# Patient Record
Sex: Male | Born: 1970 | Race: Black or African American | Hispanic: No | Marital: Single | Smoking: Former smoker
Health system: Southern US, Community
[De-identification: ages and names within clinical notes are randomized; demographics above are authoritative.]

## PROBLEM LIST (undated history)

## (undated) DIAGNOSIS — F1910 Other psychoactive substance abuse, uncomplicated: Secondary | ICD-10-CM

## (undated) DIAGNOSIS — A938 Other specified arthropod-borne viral fevers: Secondary | ICD-10-CM

## (undated) DIAGNOSIS — B1920 Unspecified viral hepatitis C without hepatic coma: Secondary | ICD-10-CM

## (undated) DIAGNOSIS — N5089 Other specified disorders of the male genital organs: Secondary | ICD-10-CM

## (undated) DIAGNOSIS — K029 Dental caries, unspecified: Secondary | ICD-10-CM

## (undated) DIAGNOSIS — H209 Unspecified iridocyclitis: Secondary | ICD-10-CM

## (undated) DIAGNOSIS — I4891 Unspecified atrial fibrillation: Secondary | ICD-10-CM

## (undated) DIAGNOSIS — I480 Paroxysmal atrial fibrillation: Secondary | ICD-10-CM

## (undated) HISTORY — DX: Other specified arthropod-borne viral fevers: A93.8

## (undated) HISTORY — PX: FINGER SURGERY: SHX640

## (undated) HISTORY — DX: Unspecified iridocyclitis: H20.9

## (undated) HISTORY — DX: Unspecified viral hepatitis C without hepatic coma: B19.20

## (undated) HISTORY — DX: Dental caries, unspecified: K02.9

## (undated) HISTORY — DX: Other specified disorders of the male genital organs: N50.89

## (undated) HISTORY — PX: SHOULDER SURGERY: SHX246

## (undated) HISTORY — PX: OTHER SURGICAL HISTORY: SHX169

---

## 2008-05-27 ENCOUNTER — Inpatient Hospital Stay (HOSPITAL_COMMUNITY): Admission: EM | Admit: 2008-05-27 | Discharge: 2008-05-31 | Payer: Self-pay | Admitting: Emergency Medicine

## 2009-03-04 ENCOUNTER — Emergency Department (HOSPITAL_COMMUNITY): Admission: EM | Admit: 2009-03-04 | Discharge: 2009-03-04 | Payer: Self-pay | Admitting: Family Medicine

## 2009-03-15 ENCOUNTER — Emergency Department (HOSPITAL_COMMUNITY): Admission: EM | Admit: 2009-03-15 | Discharge: 2009-03-15 | Payer: Self-pay | Admitting: Emergency Medicine

## 2009-05-28 ENCOUNTER — Emergency Department (HOSPITAL_COMMUNITY): Admission: EM | Admit: 2009-05-28 | Discharge: 2009-05-28 | Payer: Self-pay | Admitting: Emergency Medicine

## 2009-08-12 ENCOUNTER — Emergency Department (HOSPITAL_COMMUNITY): Admission: EM | Admit: 2009-08-12 | Discharge: 2009-08-12 | Payer: Self-pay | Admitting: Emergency Medicine

## 2009-09-23 ENCOUNTER — Emergency Department (HOSPITAL_COMMUNITY): Admission: EM | Admit: 2009-09-23 | Discharge: 2009-09-23 | Payer: Self-pay | Admitting: Emergency Medicine

## 2009-09-25 ENCOUNTER — Emergency Department (HOSPITAL_COMMUNITY): Admission: EM | Admit: 2009-09-25 | Discharge: 2009-09-25 | Payer: Self-pay | Admitting: Emergency Medicine

## 2010-01-08 ENCOUNTER — Emergency Department (HOSPITAL_COMMUNITY): Admission: EM | Admit: 2010-01-08 | Discharge: 2010-01-08 | Payer: Self-pay | Admitting: Emergency Medicine

## 2010-02-15 ENCOUNTER — Emergency Department (HOSPITAL_COMMUNITY): Admission: EM | Admit: 2010-02-15 | Discharge: 2010-02-15 | Payer: Self-pay | Admitting: Emergency Medicine

## 2010-03-12 ENCOUNTER — Emergency Department (HOSPITAL_COMMUNITY): Admission: EM | Admit: 2010-03-12 | Discharge: 2010-03-12 | Payer: Self-pay | Admitting: Emergency Medicine

## 2010-03-22 ENCOUNTER — Emergency Department (HOSPITAL_COMMUNITY): Admission: EM | Admit: 2010-03-22 | Discharge: 2010-03-22 | Payer: Self-pay | Admitting: Emergency Medicine

## 2010-03-27 ENCOUNTER — Emergency Department (HOSPITAL_COMMUNITY): Admission: EM | Admit: 2010-03-27 | Discharge: 2010-03-28 | Payer: Self-pay | Admitting: Emergency Medicine

## 2010-04-08 ENCOUNTER — Emergency Department (HOSPITAL_COMMUNITY): Admission: EM | Admit: 2010-04-08 | Discharge: 2010-04-09 | Payer: Self-pay | Admitting: Emergency Medicine

## 2010-04-14 ENCOUNTER — Emergency Department (HOSPITAL_COMMUNITY): Admission: EM | Admit: 2010-04-14 | Discharge: 2010-04-14 | Payer: Self-pay | Admitting: Emergency Medicine

## 2010-05-21 ENCOUNTER — Emergency Department (HOSPITAL_COMMUNITY): Admission: EM | Admit: 2010-05-21 | Discharge: 2010-05-21 | Payer: Self-pay | Admitting: Emergency Medicine

## 2010-10-13 ENCOUNTER — Emergency Department (HOSPITAL_COMMUNITY)
Admission: EM | Admit: 2010-10-13 | Discharge: 2010-10-14 | Payer: Self-pay | Source: Home / Self Care | Admitting: Emergency Medicine

## 2011-01-24 LAB — POCT I-STAT, CHEM 8
BUN: 5 mg/dL — ABNORMAL LOW (ref 6–23)
Calcium, Ion: 0.98 mmol/L — ABNORMAL LOW (ref 1.12–1.32)
Glucose, Bld: 127 mg/dL — ABNORMAL HIGH (ref 70–99)
HCT: 45 % (ref 39.0–52.0)
TCO2: 23 mmol/L (ref 0–100)

## 2011-01-24 LAB — COMPREHENSIVE METABOLIC PANEL
ALT: 70 U/L — ABNORMAL HIGH (ref 0–53)
Alkaline Phosphatase: 92 U/L (ref 39–117)
CO2: 27 mEq/L (ref 19–32)
Chloride: 107 mEq/L (ref 96–112)
GFR calc Af Amer: >60 mL/min (ref >60)
Sodium: 141 mEq/L (ref 135–145)
Total Bilirubin: 0.6 mg/dL (ref 0.3–1.2)

## 2011-01-24 LAB — PROTIME-INR
INR: 0.99 (ref 0.00–1.49)
Prothrombin Time: 13 seconds (ref 11.6–15.2)

## 2011-01-24 LAB — CBC
HCT: 41.8 % (ref 39.0–52.0)
Hemoglobin: 15 g/dL (ref 13.0–17.0)
MCHC: 35.8 g/dL (ref 30.0–36.0)
MCV: 90.3 fL (ref 78.0–100.0)
Platelets: 250 10*3/uL (ref 150–400)
RDW: 14.4 % (ref 11.5–15.5)
WBC: 5 10*3/uL (ref 4.0–10.5)

## 2011-03-07 ENCOUNTER — Emergency Department (HOSPITAL_COMMUNITY)
Admission: EM | Admit: 2011-03-07 | Discharge: 2011-03-07 | Disposition: A | Discharge status: Home / Self Care | Payer: Self-pay | Attending: Emergency Medicine | Admitting: Emergency Medicine

## 2011-03-07 ENCOUNTER — Emergency Department (HOSPITAL_COMMUNITY): Payer: Self-pay

## 2011-03-07 DIAGNOSIS — M545 Low back pain, unspecified: Secondary | ICD-10-CM | POA: Insufficient documentation

## 2011-03-07 DIAGNOSIS — W108XXA Fall (on) (from) other stairs and steps, initial encounter: Secondary | ICD-10-CM | POA: Insufficient documentation

## 2011-03-07 DIAGNOSIS — Y92009 Unspecified place in unspecified non-institutional (private) residence as the place of occurrence of the external cause: Secondary | ICD-10-CM | POA: Insufficient documentation

## 2011-03-14 ENCOUNTER — Emergency Department (HOSPITAL_COMMUNITY)
Admission: EM | Admit: 2011-03-14 | Discharge: 2011-03-14 | Disposition: A | Discharge status: Home / Self Care | Payer: Self-pay | Attending: Emergency Medicine | Admitting: Emergency Medicine

## 2011-03-14 DIAGNOSIS — D573 Sickle-cell trait: Secondary | ICD-10-CM | POA: Insufficient documentation

## 2011-03-14 DIAGNOSIS — L500 Allergic urticaria: Secondary | ICD-10-CM | POA: Insufficient documentation

## 2011-03-14 DIAGNOSIS — L299 Pruritus, unspecified: Secondary | ICD-10-CM | POA: Insufficient documentation

## 2011-03-22 NOTE — Op Note (Signed)
[name], [name] NO.:  [id number]   MEDICAL RECORD NO.:  [id number]          PATIENT TYPE:  INP   LOCATION:  5012                         FACILITY:  [facility]   PHYSICIAN:  Dionne Ano. Gramig III, M.D.DATE OF BIRTH:  [birth year]   DATE OF PROCEDURE:  DATE OF DISCHARGE:                               OPERATIVE REPORT   PREOPERATIVE DIAGNOSIS:  Left middle finger deep abscess.   POSTOPERATIVE DIAGNOSIS:  Left middle finger deep abscess.   PROCEDURE:  Incision and drainage of left middle finger deep abscess.   SURGEON:  Dionne Ano. Amanda Pea, MD   ASSISTANT:  None.   COMPLICATIONS:  None.   ANESTHESIA:  General.   TOURNIQUET TIME:  Less than an hour.   INDICATIONS FOR PROCEDURE:  Bruce [name] is a pleasant male who 4 days  ago sustained a crushing injury to his finger.  He was seen in the  emergency department.  I was asked to see and take over his care in  regards to his deep abscess.  He has significant pain complaints and  disarray about the left middle finger.  He desires to proceed with the  above-mentioned operative intervention.   OPERATION IN DETAIL:  The patient was seen by myself and anesthesia,  taken to the operative suite, and underwent a smooth induction of  general anesthesia.  Placed supine and prepped and draped in the usual  sterile fashion with Betadine scrub and paint.  Following this, a time-  out was called and I and D was performed about the left middle finger.  This was I and D of a deep abscess and removal of nonviable tissue  dorsally and volarly occurred.  I entered the finger pulp as well as the  dorsal apparatus and removed the medial and lateral hard nail plate to  prevent problems with sequestered infection.  He was lavaged with 2  liters of saline and following this, he was placed in a sterile  dressing.   The patient will be admitted for IV antibiotics, general postop  observation, will begin whirlpools tomorrow, and daily  packing of the  wound.  I have discussed with the patient these issues at length, do's  and don'ts, etc., and the family is aware.      Dionne Ano. Everlene Other, M.D.    Nash Mantis  D:  05/28/2008  T:  05/28/2008  Job:  [id number]

## 2011-03-25 NOTE — Discharge Summary (Signed)
NAMESABATINO, Bruce NO.:  [id number]   MEDICAL RECORD NO.:  [id number]          PATIENT TYPE:  INP   LOCATION:  5012                         FACILITY:  [facility]   PHYSICIAN:  Dionne Ano. Gramig, M.D.DATE OF BIRTH:  Mar 12, 1971   DATE OF ADMISSION:  05/27/2008  DATE OF DISCHARGE:  05/31/2008                               DISCHARGE SUMMARY   ADMITTING DIAGNOSIS:  Left middle finger deep abscess.   DISCHARGE DIAGNOSIS:  Left middle finger deep abscess, improved.   SURGEON:  Dionne Ano. Gramig, MD   PROCEDURE:  Irrigation and debridement, left middle finger.   CONSULTANTS:  None.   BRIEF HISTORY OF PRESENT ILLNESS:  Bruce [name] is a pleasant 40 year old  gentleman who was seen and evaluated at [facility] Emergency Room after  he sustained a crush injury with possible retention of a foreign body to  the left middle finger.  He had a knee injury 4 days prior to his ER  visit and had increasing pain, soft tissue swelling, and erythema.  The  patient was updated with a tetanus shot.  He had been seen and evaluated  initially in the emergency room setting and given a gram of vancomycin  prior to hand consultation.  Upon evaluation, he was noted to have  significant pain, swelling to the distal tip of the finger consistent  with deep abscess.  There was noted erythema and ascending cellulitis.  Given the findings, we discussed with him at length the need for I and  D, finger reconstructions necessary, as well as admission to the  hospital for IV antibiotics, whirlpool and wound care.   HOSPITAL COURSE:  On May 27, 2008, the patient was admitted and  underwent I and D of the left middle finger without difficulty.  He  tolerated the procedure well.  Please see operative report for full  details.  He was prophylactic, placed on vancomycin and gentamicin for  postoperative antibiotic regime while intraoperative cultures were  pending.  However, given the fact the patient was  given vancomycin  preoperatively in the emergency room setting, high suspicion for skewed  results was noted.  Postoperative day #1, the patient was doing well and  he was less tender.  He is doing well with wound care.  Daily dressing  changes and packing.  He did complain of some pruritus about the  extremities and at the scrotal area.  On examination the patient was  stable.  He was afebrile.  His fingers showed positive erythema.  His  packing was intact.  He had no [place], positive range of motion.  No  frank purulence was noted.  Left forearm showed mild pruritic papules,  however, there is no erythema, no lesions present.  Scrotal area was  without edema, erythema, or obvious lesions or excoriations posterior  cast and small area of papule, otherwise no findings.  The patient was  given a medicated powder (Micro-Guard) to apply to scrotal area.  In  addition, hydrocortisone cream could be applied to the extremities given  isolated lesions at bedside.  Continue the IV antibiotics, and  cultures  were pending.  Diligent wound care was performed postoperatively.  Postop day #2, the patient was improved overall.  His pruritus was  improved with Benadryl, and his vital signs were stable.  He is  afebrile.  Cultures had no growth at that juncture.  Vital signs are  stable and afebrile.  White blood cell count was within normal limits.  His finger continued to improve without gross purulence or signs of any  accumulated infection.  The patient's condition continued to improve  over the following day.  Cultures were noted for multiple species  without predominance.  The patient continued wound care and on May 31, 2008, decision was made to discharge him home.   ASSESSMENT/FINAL DIAGNOSIS:  See discharge diagnosis.   PLAN:  Condition on discharge improved.   DIET:  Regular diet.   ACTIVITY:  He will keep his dressing clean, dry, and intact.  He will  need daily wound care with  dressing changes and packing.   Discharge medications Percocet 5/325 one to two p.o. q.4-6 h. p.r.n.  pain, over-the-counter Benadryl as needed, Bactrim DS one p.o. b.i.d.,  and vitamin C 500 mg daily.   The patient will follow up with [place] in the office setting in 1 week and  then he will work on home dressing changes, will call for any  difficulties.  All questions were encouraged and answered.      Bruce [name], P.A.-C.    ______________________________  Dionne Ano. Amanda Pea, M.D.    BB/MEDQ  D:  07/08/2008  T:  07/09/2008  Job:  [id number]

## 2011-05-04 ENCOUNTER — Emergency Department (HOSPITAL_COMMUNITY): Payer: Self-pay

## 2011-05-04 ENCOUNTER — Emergency Department (HOSPITAL_COMMUNITY)
Admission: EM | Admit: 2011-05-04 | Discharge: 2011-05-04 | Disposition: A | Discharge status: Home / Self Care | Payer: Self-pay | Attending: Emergency Medicine | Admitting: Emergency Medicine

## 2011-05-04 DIAGNOSIS — M25579 Pain in unspecified ankle and joints of unspecified foot: Secondary | ICD-10-CM | POA: Insufficient documentation

## 2011-05-04 DIAGNOSIS — X500XXA Overexertion from strenuous movement or load, initial encounter: Secondary | ICD-10-CM | POA: Insufficient documentation

## 2011-05-04 DIAGNOSIS — S93409A Sprain of unspecified ligament of unspecified ankle, initial encounter: Secondary | ICD-10-CM | POA: Insufficient documentation

## 2011-05-04 DIAGNOSIS — Y9367 Activity, basketball: Secondary | ICD-10-CM | POA: Insufficient documentation

## 2011-05-04 DIAGNOSIS — D573 Sickle-cell trait: Secondary | ICD-10-CM | POA: Insufficient documentation

## 2011-08-05 LAB — CBC
HCT: 46.6
Hemoglobin: 16
Platelets: 216
RBC: 5.1
WBC: 4.4

## 2011-08-05 LAB — BASIC METABOLIC PANEL
BUN: 4 — ABNORMAL LOW
GFR calc Af Amer: >60
GFR calc non Af Amer: >60
Potassium: 3.7
Sodium: 139

## 2011-08-05 LAB — CULTURE, ROUTINE-ABSCESS: Gram Stain: NONE SEEN

## 2011-08-05 LAB — ANAEROBIC CULTURE

## 2011-08-05 LAB — DIFFERENTIAL
Eosinophils Relative: 4
Lymphocytes Relative: 26
Lymphs Abs: 1.1

## 2012-03-30 ENCOUNTER — Encounter (HOSPITAL_COMMUNITY): Payer: Self-pay | Admitting: Emergency Medicine

## 2012-03-30 ENCOUNTER — Emergency Department (HOSPITAL_COMMUNITY)
Admission: EM | Admit: 2012-03-30 | Discharge: 2012-03-30 | Disposition: A | Discharge status: Home / Self Care | Payer: Self-pay | Attending: Emergency Medicine | Admitting: Emergency Medicine

## 2012-03-30 DIAGNOSIS — H209 Unspecified iridocyclitis: Secondary | ICD-10-CM | POA: Insufficient documentation

## 2012-03-30 DIAGNOSIS — Z87891 Personal history of nicotine dependence: Secondary | ICD-10-CM | POA: Insufficient documentation

## 2012-03-30 MED ORDER — FLUORESCEIN SODIUM 1 MG OP STRP
ORAL_STRIP | OPHTHALMIC | Status: AC
  Administered 2012-03-30: 03:00:00
  Filled 2012-03-30: qty 1

## 2012-03-30 MED ORDER — ATROPINE SULFATE 1 % OP SOLN
1.0000 [drp] | Freq: Once | OPHTHALMIC | Status: AC
  Administered 2012-03-30: 1 [drp] via OPHTHALMIC
  Filled 2012-03-30: qty 2

## 2012-03-30 MED ORDER — TETRACAINE HCL 0.5 % OP SOLN
1.0000 [drp] | Freq: Once | OPHTHALMIC | Status: AC
  Administered 2012-03-30: 1 [drp] via OPHTHALMIC
  Filled 2012-03-30: qty 2

## 2012-03-30 MED ORDER — OXYCODONE-ACETAMINOPHEN 5-325 MG PO TABS
2.0000 | ORAL_TABLET | Freq: Once | ORAL | Status: AC
  Administered 2012-03-30: 2 via ORAL
  Filled 2012-03-30: qty 2

## 2012-03-30 MED ORDER — OXYCODONE-ACETAMINOPHEN 5-325 MG PO TABS: 2.0000 | ORAL_TABLET | Freq: Four times a day (QID) | ORAL | Status: AC | PRN

## 2012-03-30 NOTE — ED Provider Notes (Signed)
History     CSN: [id number]  Arrival date & time 03/30/12  0007   First MD Initiated Contact with Patient 03/30/12 0211      Chief Complaint  Patient presents with  . Foreign Body in Eye    (Consider location/radiation/quality/duration/timing/severity/associated sxs/prior treatment) HPI Comments: Patient states he was playing basketball, eczema, got hit in the right, eye.  He's been having pain since then, worse tonight with photophobia, and redness.  He is unable to open his eye due to the pain is a foreign body sensation  Patient is a 41 y.o. male presenting with foreign body in eye. The history is provided by the patient.  Foreign Body in Eye This is a recurrent problem. The current episode started in the past 7 days. The problem occurs constantly. The problem has been gradually worsening. Associated symptoms include headaches. Pertinent negatives include no fever.    History reviewed. No pertinent past medical history.  History reviewed. No pertinent past surgical history.  History reviewed. No pertinent family history.  History  Substance Use Topics  . Smoking status: Former Games developer  . Smokeless tobacco: Not on file  . Alcohol Use: No      Review of Systems  Constitutional: Negative for fever.  Eyes: Positive for photophobia and pain. Negative for itching.  Neurological: Positive for headaches.    Allergies  Penicillins  Home Medications  No current outpatient prescriptions on file.  BP 99/63  Pulse 63  Temp(Src) 97.8 F (36.6 C) (Oral)  Resp 20  SpO2 96%  Physical Exam  Constitutional: He appears well-developed and well-nourished.  HENT:  Head: Normocephalic and atraumatic.  Eyes: Left eye exhibits no discharge. Right conjunctiva is injected. Left conjunctiva is not injected. Left conjunctiva has no hemorrhage. Right eye exhibits no nystagmus. Left eye exhibits normal extraocular motion and no nystagmus.  Slit lamp exam:      The right eye shows no  fluorescein uptake.       After fluorescein, and tetracycline.  Patient still unable to open his eye 2 to reaction to the light of asked for.  Atropine drops to be instilled as a cycloplegic for better exam  Neck: Normal range of motion.  Cardiovascular: Normal rate.   Pulmonary/Chest: Effort normal.  Musculoskeletal: Normal range of motion.  Neurological: He is alert.  Skin: Skin is warm.   after atropine drop instilled in eye.  Patient now able to open his eye focus.  No longer blurry vision.  Pain is  ED Course  Procedures (including critical care time)  Labs Reviewed - No data to display No results found.     MDM  Traumatic iritis        Arman Filter, NP 03/30/12 0421  Arman Filter, NP 03/30/12 [phone]

## 2012-03-30 NOTE — ED Provider Notes (Signed)
Medical screening examination/treatment/procedure(s) were performed by non-physician practitioner and as supervising physician I was immediately available for consultation/collaboration.  Cheri Guppy, MD 03/30/12 [phone]

## 2012-03-30 NOTE — ED Notes (Signed)
Patient with irritation of right eye, red and photophobia.  Patient states that it has been happening for last three days.

## 2012-03-30 NOTE — Discharge Instructions (Signed)
Iritis Iritis is when the colored part of the eye (iris) is red and painful (inflamed). Light might seem to hurt your eyes (light sensitivity). You may have blurred vision or start to tear up. It is important to treat this problem early.  HOME CARE  Use eyedrops or pills (corticosteroid medicine) as told by your doctor.   Only take medicine as told by your doctor.  GET HELP RIGHT AWAY IF:   You have redness in one or both eyes.   Light seems to hurt your eyes.   You have pain in one or both eyes.  MAKE SURE YOU:   Understand these instructions.   Will watch your condition.   Will get help right away if you are not doing well or get worse.  Document Released: 01/18/2010 Document Revised: 10/13/2011 Document Reviewed: 01/18/2010 [facility] Patient Information 2012 [place], [place]. I atropine eyedrops, one drop to your right eye 4 times a day.  Please make sure to call the ophthalmologist first thing in the morning to be seen for reexamination.  Seen in the emergency department, and referred

## 2012-04-05 ENCOUNTER — Emergency Department (HOSPITAL_COMMUNITY)
Admission: EM | Admit: 2012-04-05 | Discharge: 2012-04-06 | Disposition: A | Discharge status: Home / Self Care | Payer: Self-pay | Attending: Emergency Medicine | Admitting: Emergency Medicine

## 2012-04-05 ENCOUNTER — Encounter (HOSPITAL_COMMUNITY): Payer: Self-pay | Admitting: Emergency Medicine

## 2012-04-05 DIAGNOSIS — H53149 Visual discomfort, unspecified: Secondary | ICD-10-CM | POA: Insufficient documentation

## 2012-04-05 DIAGNOSIS — H209 Unspecified iridocyclitis: Secondary | ICD-10-CM | POA: Insufficient documentation

## 2012-04-05 MED ORDER — TETRACAINE HCL 0.5 % OP SOLN
OPHTHALMIC | Status: AC
  Administered 2012-04-05
  Filled 2012-04-05: qty 2

## 2012-04-05 NOTE — ED Notes (Signed)
Patient with right eye pain.  Patient seen here a few weeks ago with same.  Patient still having problems with double vision in right eye, photophobia and pain.  Patient states that eye appt is 2 weeks away.  Patient is CAOx3.

## 2012-04-05 NOTE — ED Notes (Signed)
PT. REPORTS PERSISTENT RIGHT EYE PAIN / BLURRED VISION , SEEN HERE LAST WEEK PRESCRIBED WITH EYE GTTS AND REFERRAL TO EYE MD - STATES APPT. IS 2 WEEKS FROM NOW.

## 2012-04-06 MED ORDER — ONDANSETRON 4 MG PO TBDP
ORAL_TABLET | ORAL | Status: AC
  Administered 2012-04-06: 8 mg via ORAL
  Filled 2012-04-06: qty 2

## 2012-04-06 MED ORDER — OXYCODONE-ACETAMINOPHEN 5-325 MG PO TABS
2.0000 | ORAL_TABLET | Freq: Once | ORAL | Status: AC
  Administered 2012-04-06: 2 via ORAL
  Filled 2012-04-06: qty 2

## 2012-04-06 MED ORDER — ATROPINE SULFATE 1 % OP SOLN
1.0000 [drp] | Freq: Two times a day (BID) | OPHTHALMIC | Status: DC
  Administered 2012-04-06: 1 [drp] via OPHTHALMIC
  Filled 2012-04-06: qty 2

## 2012-04-06 MED ORDER — PREDNISOLONE ACETATE 1 % OP SUSP
1.0000 [drp] | Freq: Four times a day (QID) | OPHTHALMIC | Status: DC
  Administered 2012-04-06: 1 [drp] via OPHTHALMIC
  Filled 2012-04-06: qty 1

## 2012-04-06 NOTE — Discharge Instructions (Signed)
Uveitis  Keep your scheduled followup appointment today in the clinic at 10 AM with the ophthalmologist. Bring your eyedrops with you and take them as directed.   The uveal tract (uvea) is a layer of the eye made up of three structures:   The choroid - a layer containing the eye's blood vessels located between the outer white part of the eyeball (sclera) and the inner layer (retina).   The iris - the colored portion of the eye.   The ciliary body - a area of muscle at the base of the iris that helps focus the lens.  Uveitis happens when any or all portions of the uveal tract become inflamed.   Iritis is when the iris becomes inflamed. It is the most common form of uveitis. It is extremely important to treat iritis early, as it may lead to internal eye damage causing scarring or diseases such as glaucoma. Some people have only one attack of iritis (in one or both eyes) in their lifetime, while others may get it many times.   Pars planitis is when the ciliary body becomes inflamed.   Choroiditis is when the choroid layer becomes inflamed. If the inflammation also involves the retina, it is called chorioretinitis.   Panuveitis is when inflammation affects all of the structures of the uveal tract.  CAUSES   Diseases where the body's immune system attacks tissues within your own body (autoimmune diseases).   Infections (tuberculosis, gonorrhea, fungus infections, Lyme disease, infection of the lining of the heart).   Trauma or injury.   Eye diseases (acute glaucoma and others).   Inflammation from other parts of the uveal track.   Severe eye infections.   Other rare diseases.  SYMPTOMS  Iritis  Eye pain or aching.   Light sensitivity.   Loss of sight or blurred vision.   Redness of the eye. This is often accompanied by a ring of redness around the outside of the cornea or clear covering at the front of the eye (ciliary flush).   Excessive tearing of the eye(s).   A small  pupil that does not enlarge in the dark and stays smaller than the other eye's pupil.   A whitish area that obscures the lower part of the colored iris. Sometimes this is visible when looking at the eye.  Since iritis causes the eye to become red, it is often confused with a much less dangerous form of "pink eye" or conjunctivitis. One of the most important symptoms is sensitivity to light. Anytime there is redness, discomfort in the eye(s) and extreme light sensitivity, it is extremely important to see an ophthalmologist as soon as possible. Pars Planitis  Mild, progressive drop in vision.   Floating spots in the field of vision in front of the eye.   Development of a cataract.  Choroiditis  May have no symptoms.   Progressive drop in vision.   Loss of vision in an isolated area of peripheral vision (scotoma) or to the side.   Sore, aching eye.  TREATMENT  Iritis  Corticosteroid eye drops and dilating agent eye drops are often given. Corticosteroid eye drops are used to decrease inflammation. Dilating drops help to prevent scarring of the iris and the risk of the iris becoming "stuck" to the front surface of the lens. Follow your caregivers exact instructions on taking and stopping corticosteroid medications (drops or pills).   Sometimes, the iritis will be so severe that it will not respond to commonly used medications. If  this happens, it may be necessary to use steroid injections. The injections are given under the eye's outer surface. Sometimes oral medications are given. Treatment used for iritis is usually made on an individual basis.  Pars Planitis  If mild, no treatment may be required   Corticosteroids are usually given orally or for severe cases, injections are given under the surface of the eye.  Choroiditis  If associated with an underlying disease, the disease itself is treated.   Corticosteroids are usually administered by mouth (orally).   Corticosteroid  injections or other medicines specific are given to treat the cause of infection or inflammation. These injections are given into the interior cavity of the eye (intravitreal injection).   If caused by a parasite or worm that can be seen by the ophthalmologists, laser treatments may be used to kill the parasite.  Panuveitis  Treatment depends upon the cause and severity of the inflammation. It may consist of any one, or a combination of the treatments outlined above.  HOME CARE INSTRUCTIONS  Your caregiver will give specific instructions regarding the use of eye medications or other medications. Follow all instructions in both taking and stopping the medications. SEEK IMMEDIATE MEDICAL CARE IF:  You have redness of one or both eyes.   You experience a great deal of light sensitivity.   You have pain or aching in either eye.   You notice a drop in vision in either eye.  MAKE SURE YOU:   Understand these instructions.   Will watch your condition.   Will get help right away if you are not doing well or get worse.

## 2012-04-06 NOTE — ED Notes (Signed)
Patient complaining of nausea.  MD notified.  New orders per Dr Dierdre Highman.

## 2012-04-06 NOTE — ED Provider Notes (Signed)
History     CSN: [id number]  Arrival date & time 04/05/12  2145   First MD Initiated Contact with Patient 04/05/12 2257      Chief Complaint  Patient presents with  . Eye Pain    (Consider location/radiation/quality/duration/timing/severity/associated sxs/prior treatment) HPI History provided by patient. Right eye pain for the last 2 weeks. Evaluated here week ago and diagnosed with uveitis. Patient states he was given referral to the ophthalmologist to be seen the next day and instead was scheduled to be seen in 2 weeks. He was given eyedrops but unable to recall what they were. They did seem to help somewhat the symptoms but they have returned. He has associated blurry vision in that eye. Some tearing but no drainage. No fevers. No trauma. Did have some foreign body sensation without any known foreign body or mechanism for the same. Does not wear contacts. No rashes. No headache. No sick contacts. No history of arthritis or autoimmune disease. No other medical problems. is not a welder or exposed to any UV radiation. Patient presents with persistent ongoing symptoms History reviewed. No pertinent past medical history.  History reviewed. No pertinent past surgical history.  No family history on file.  History  Substance Use Topics  . Smoking status: Former Games developer  . Smokeless tobacco: Not on file  . Alcohol Use: No      Review of Systems  Constitutional: Negative for fever and chills.  HENT: Negative for neck pain and neck stiffness.   Eyes: Positive for photophobia, pain and redness.  Respiratory: Negative for shortness of breath.   Cardiovascular: Negative for chest pain.  Gastrointestinal: Negative for abdominal pain.  Genitourinary: Negative for dysuria.  Musculoskeletal: Negative for back pain.  Skin: Negative for rash.  Neurological: Negative for headaches.  All other systems reviewed and are negative.    Allergies  Penicillins  Home Medications   Current  Outpatient Rx  Name Route Sig Dispense Refill  . OXYCODONE-ACETAMINOPHEN 5-325 MG PO TABS Oral Take 2 tablets by mouth every 6 (six) hours as needed for pain. 20 tablet 0    BP 117/74  Pulse 60  Temp(Src) 98.1 F (36.7 C) (Oral)  Resp 14  SpO2 100%  Physical Exam  Constitutional: He is oriented to person, place, and time. He appears well-developed and well-nourished.  HENT:  Head: Normocephalic and atraumatic.  Eyes: EOM are normal. Pupils are equal, round, and reactive to light.       Has a very difficult time opening his right eye due to photophobia and pain. Conjunctival injection present. No obvious foreign body. No dye uptake with fluoro. There is tenderness over the eye. No discharge. No consensual photophobia. No peri orbital erythema or swelling. Lids and lashes clear. Tono-Pen measured 23 right eye.    Neck: Trachea normal. Neck supple. No thyromegaly present.  Cardiovascular: Normal rate, regular rhythm, S1 normal, S2 normal and normal pulses.     No systolic murmur is present   No diastolic murmur is present  Pulses:      Radial pulses are 2+ on the right side, and 2+ on the left side.  Pulmonary/Chest: Effort normal and breath sounds normal. He has no wheezes. He has no rhonchi. He has no rales. He exhibits no tenderness.  Abdominal: Soft. Normal appearance and bowel sounds are normal. There is no tenderness. There is no CVA tenderness and negative Murphy's sign.  Musculoskeletal:       Normal gait no extremity abnormalities  Neurological: He  is alert and oriented to person, place, and time. He has normal strength. No sensory deficit. GCS eye subscore is 4. GCS verbal subscore is 5. GCS motor subscore is 6.  Skin: Skin is warm and dry. No rash noted. He is not diaphoretic.  Psychiatric: His speech is normal.       Cooperative and appropriate    ED Course  Procedures (including critical care time)  12:12 AM d/w DR Gwen Pounds as above, recs atropine and pred-forte and  will see PT in am today at 10:00am in his office. H did ask for PT to bring all eye drops with him.   Pain medications provided. Drops provided as above. Patient agrees to followup at 10 AM in the clinic   MDM   Right eye pain concern for uveitis. Eyedrops as above. Pain control. Ophthalmology followup today in the clinic.        Sunnie Nielsen, MD 04/06/12 [phone]

## 2012-05-01 ENCOUNTER — Emergency Department (HOSPITAL_COMMUNITY): Payer: Self-pay

## 2012-05-01 ENCOUNTER — Inpatient Hospital Stay (HOSPITAL_COMMUNITY)
Admission: EM | Admit: 2012-05-01 | Discharge: 2012-05-06 | DRG: 866 | Disposition: A | Discharge status: Home / Self Care | Payer: Self-pay | Attending: Infectious Disease | Admitting: Infectious Disease

## 2012-05-01 ENCOUNTER — Encounter (HOSPITAL_COMMUNITY): Payer: Self-pay | Admitting: *Deleted

## 2012-05-01 DIAGNOSIS — A938 Other specified arthropod-borne viral fevers: Principal | ICD-10-CM | POA: Diagnosis present

## 2012-05-01 DIAGNOSIS — D759 Disease of blood and blood-forming organs, unspecified: Secondary | ICD-10-CM | POA: Diagnosis present

## 2012-05-01 DIAGNOSIS — B356 Tinea cruris: Secondary | ICD-10-CM | POA: Diagnosis present

## 2012-05-01 DIAGNOSIS — R5081 Fever presenting with conditions classified elsewhere: Secondary | ICD-10-CM | POA: Diagnosis present

## 2012-05-01 DIAGNOSIS — D696 Thrombocytopenia, unspecified: Secondary | ICD-10-CM | POA: Diagnosis present

## 2012-05-01 DIAGNOSIS — D573 Sickle-cell trait: Secondary | ICD-10-CM | POA: Diagnosis present

## 2012-05-01 DIAGNOSIS — Y9239 Other specified sports and athletic area as the place of occurrence of the external cause: Secondary | ICD-10-CM

## 2012-05-01 DIAGNOSIS — IMO0001 Reserved for inherently not codable concepts without codable children: Secondary | ICD-10-CM | POA: Diagnosis present

## 2012-05-01 DIAGNOSIS — R519 Headache, unspecified: Secondary | ICD-10-CM | POA: Diagnosis present

## 2012-05-01 DIAGNOSIS — N433 Hydrocele, unspecified: Secondary | ICD-10-CM | POA: Diagnosis present

## 2012-05-01 DIAGNOSIS — Y9367 Activity, basketball: Secondary | ICD-10-CM

## 2012-05-01 DIAGNOSIS — N4340 Spermatocele of epididymis, unspecified: Secondary | ICD-10-CM | POA: Diagnosis present

## 2012-05-01 DIAGNOSIS — R945 Abnormal results of liver function studies: Secondary | ICD-10-CM | POA: Diagnosis present

## 2012-05-01 DIAGNOSIS — I861 Scrotal varices: Secondary | ICD-10-CM | POA: Diagnosis present

## 2012-05-01 DIAGNOSIS — M791 Myalgia, unspecified site: Secondary | ICD-10-CM | POA: Diagnosis present

## 2012-05-01 DIAGNOSIS — Z880 Allergy status to penicillin: Secondary | ICD-10-CM

## 2012-05-01 DIAGNOSIS — M2550 Pain in unspecified joint: Secondary | ICD-10-CM | POA: Diagnosis present

## 2012-05-01 DIAGNOSIS — Z87891 Personal history of nicotine dependence: Secondary | ICD-10-CM

## 2012-05-01 DIAGNOSIS — R51 Headache: Secondary | ICD-10-CM | POA: Diagnosis present

## 2012-05-01 DIAGNOSIS — D709 Neutropenia, unspecified: Secondary | ICD-10-CM | POA: Diagnosis present

## 2012-05-01 DIAGNOSIS — T07XXXA Unspecified multiple injuries, initial encounter: Secondary | ICD-10-CM | POA: Diagnosis present

## 2012-05-01 LAB — CBC
HCT: 41.3 % (ref 39.0–52.0)
HCT: 44.1 % (ref 39.0–52.0)
Hemoglobin: 14.9 g/dL (ref 13.0–17.0)
Hemoglobin: 15.4 g/dL (ref 13.0–17.0)
MCH: 30.1 pg (ref 26.0–34.0)
MCHC: 36.1 g/dL — ABNORMAL HIGH (ref 30.0–36.0)
MCHC: 34.9 g/dL (ref 30.0–36.0)
MCV: 83.4 fL (ref 78.0–100.0)
Platelets: 135 10*3/uL — ABNORMAL LOW (ref 150–400)
RBC: 4.95 MIL/uL (ref 4.22–5.81)
RDW: 12.9 % (ref 11.5–15.5)
WBC: 2.2 10*3/uL — ABNORMAL LOW (ref 4.0–10.5)
WBC: 1.5 10*3/uL — ABNORMAL LOW (ref 4.0–10.5)

## 2012-05-01 LAB — HEPATIC FUNCTION PANEL
AST: 64 U/L — ABNORMAL HIGH (ref 0–37)
Albumin: 3.3 g/dL — ABNORMAL LOW (ref 3.5–5.2)
Total Bilirubin: 0.6 mg/dL (ref 0.3–1.2)

## 2012-05-01 LAB — DIFFERENTIAL
Basophils Absolute: 0 10*3/uL (ref 0.0–0.1)
Basophils Relative: 1 % (ref 0–1)
Eosinophils Absolute: 0 10*3/uL (ref 0.0–0.7)
Eosinophils Relative: 0 % (ref 0–5)
Lymphocytes Relative: 25 % (ref 12–46)
Lymphs Abs: 0.6 10*3/uL — ABNORMAL LOW (ref 0.7–4.0)
Monocytes Absolute: 0.2 10*3/uL (ref 0.1–1.0)
Monocytes Relative: 7 % (ref 3–12)
Neutro Abs: 1.5 10*3/uL — ABNORMAL LOW (ref 1.7–7.7)
Neutrophils Relative %: 68 % (ref 43–77)

## 2012-05-01 LAB — URINALYSIS, ROUTINE W REFLEX MICROSCOPIC
Bilirubin Urine: NEGATIVE
Glucose, UA: NEGATIVE mg/dL
Hgb urine dipstick: NEGATIVE
Ketones, ur: NEGATIVE mg/dL
Leukocytes, UA: NEGATIVE
Nitrite: NEGATIVE
Protein, ur: NEGATIVE mg/dL
Specific Gravity, Urine: 1.015 (ref 1.005–1.030)
Urobilinogen, UA: 1 mg/dL (ref 0.0–1.0)
pH: 8 (ref 5.0–8.0)

## 2012-05-01 LAB — BASIC METABOLIC PANEL
BUN: 8 mg/dL (ref 6–23)
CO2: 26 mEq/L (ref 19–32)
Calcium: 9.2 mg/dL (ref 8.4–10.5)
Chloride: 98 mEq/L (ref 96–112)
Creatinine, Ser: 1.19 mg/dL (ref 0.50–1.35)
GFR calc Af Amer: 85 mL/min — ABNORMAL LOW (ref >90)
GFR calc non Af Amer: 73 mL/min — ABNORMAL LOW (ref >90)
Glucose, Bld: 103 mg/dL — ABNORMAL HIGH (ref 70–99)
Potassium: 4 mEq/L (ref 3.5–5.1)
Sodium: 133 mEq/L — ABNORMAL LOW (ref 135–145)

## 2012-05-01 LAB — HIV ANTIBODY (ROUTINE TESTING W REFLEX): HIV: NONREACTIVE

## 2012-05-01 LAB — CREATININE, SERUM
GFR calc Af Amer: >90 mL/min (ref >90)
GFR calc non Af Amer: 83 mL/min — ABNORMAL LOW (ref >90)

## 2012-05-01 LAB — RPR: RPR Ser Ql: NONREACTIVE

## 2012-05-01 MED ORDER — ONDANSETRON HCL 4 MG/2ML IJ SOLN: 4.0000 mg | Freq: Three times a day (TID) | INTRAMUSCULAR | Status: DC | PRN

## 2012-05-01 MED ORDER — ONDANSETRON HCL 4 MG/2ML IJ SOLN: 4.0000 mg | Freq: Four times a day (QID) | INTRAMUSCULAR | Status: DC | PRN

## 2012-05-01 MED ORDER — ENOXAPARIN SODIUM 40 MG/0.4ML ~~LOC~~ SOLN
40.0000 mg | SUBCUTANEOUS | Status: DC
  Administered 2012-05-01: 40 mg via SUBCUTANEOUS
  Filled 2012-05-01 (×6): qty 0.4

## 2012-05-01 MED ORDER — ACETAMINOPHEN 325 MG PO TABS
650.0000 mg | ORAL_TABLET | Freq: Four times a day (QID) | ORAL | Status: DC | PRN
  Administered 2012-05-01 – 2012-05-03 (×3): 650 mg via ORAL
  Filled 2012-05-01 (×3): qty 2

## 2012-05-01 MED ORDER — VANCOMYCIN HCL IN DEXTROSE 1-5 GM/200ML-% IV SOLN
1000.0000 mg | Freq: Once | INTRAVENOUS | Status: AC
  Administered 2012-05-01: 1000 mg via INTRAVENOUS
  Filled 2012-05-01: qty 200

## 2012-05-01 MED ORDER — SODIUM CHLORIDE 0.9 % IJ SOLN
3.0000 mL | Freq: Two times a day (BID) | INTRAMUSCULAR | Status: DC
  Administered 2012-05-01 – 2012-05-05 (×4): 3 mL via INTRAVENOUS

## 2012-05-01 MED ORDER — DOXYCYCLINE HYCLATE 100 MG PO TABS
100.0000 mg | ORAL_TABLET | Freq: Two times a day (BID) | ORAL | Status: DC
  Administered 2012-05-01 – 2012-05-06 (×11): 100 mg via ORAL
  Filled 2012-05-01 (×13): qty 1

## 2012-05-01 MED ORDER — SODIUM CHLORIDE 0.9 % IV SOLN: 250.0000 mL | INTRAVENOUS | Status: DC | PRN

## 2012-05-01 MED ORDER — SODIUM CHLORIDE 0.9 % IJ SOLN: 3.0000 mL | INTRAMUSCULAR | Status: DC | PRN

## 2012-05-01 MED ORDER — ONDANSETRON HCL 4 MG/2ML IJ SOLN: 4.0000 mg | Freq: Once | INTRAMUSCULAR | Status: DC

## 2012-05-01 MED ORDER — ONDANSETRON HCL 4 MG PO TABS: 4.0000 mg | ORAL_TABLET | Freq: Four times a day (QID) | ORAL | Status: DC | PRN

## 2012-05-01 MED ORDER — SODIUM CHLORIDE 0.9 % IV BOLUS (SEPSIS)
1000.0000 mL | Freq: Once | INTRAVENOUS | Status: AC
  Administered 2012-05-01: 1000 mL via INTRAVENOUS

## 2012-05-01 MED ORDER — IBUPROFEN 800 MG PO TABS
800.0000 mg | ORAL_TABLET | Freq: Once | ORAL | Status: AC
  Administered 2012-05-01: 800 mg via ORAL
  Filled 2012-05-01: qty 1

## 2012-05-01 MED ORDER — LEVOFLOXACIN IN D5W 750 MG/150ML IV SOLN
750.0000 mg | Freq: Every day | INTRAVENOUS | Status: DC
  Administered 2012-05-01: 750 mg via INTRAVENOUS
  Filled 2012-05-01: qty 150

## 2012-05-01 MED ORDER — SODIUM CHLORIDE 0.9 % IV SOLN
INTRAVENOUS | Status: DC
  Administered 2012-05-01 – 2012-05-03 (×5): via INTRAVENOUS

## 2012-05-01 MED ORDER — IBUPROFEN 400 MG PO TABS: 400.0000 mg | ORAL_TABLET | Freq: Once | ORAL | Status: DC

## 2012-05-01 MED ORDER — HYDROCODONE-ACETAMINOPHEN 5-325 MG PO TABS
1.0000 | ORAL_TABLET | ORAL | Status: DC | PRN
  Administered 2012-05-01 – 2012-05-06 (×11): 1 via ORAL
  Filled 2012-05-01 (×12): qty 1

## 2012-05-01 MED ORDER — ACETAMINOPHEN 650 MG RE SUPP: 650.0000 mg | Freq: Four times a day (QID) | RECTAL | Status: DC | PRN

## 2012-05-01 NOTE — H&P (Signed)
Hospital Admission Note Date: 05/01/2012  Patient name: Bruce [name] Medical record number: [id number] Date of birth: 1971/07/12 Age: 41 y.o. Gender: male PCP: DEFAULT,PROVIDER, MD  Medical Service: Internal Medicine Teaching Service - [place]  Attending physician:  Gwen Her Dam   1st Contact:   Kristie Cowman  Pager: [phone] 2nd Contact:    Elyse Jarvis Pager: [phone] After 5 pm or weekends: 1st Contact:      Pager: [phone] 2nd Contact:      Pager: [phone]  Chief Complaint: fever and weakness  History of Present Illness: Bruce [name] is a 41 yo without significant past medical history who presents to [facility] ED with complaints of fever associated with general malaise over the past 3 days.  Patient states that he was in his normal state of health without known sick contacts and gradually began to feel weak, feverish and mildly nauseous without vomiting before today in the ED.  He states that he feels mildly short of breath on exertion but denies chest pain, cough, abdominal pain, change in bowel habits including blood or mucus, penile discharge or pain, or sick contacts He states that he does occasional work for a Editor, commissioning and has not noticed a tick bit or rash.  Additionally, he states that well over a month ago he suffered a trauma to his scrotum while playing basketball.  He subsequently developed scrotal swelling and bruising that has since resolved.  Of note, he states that he has had these scrotal swellings since the age of 24 which were evaluated at that time and deemed benign.  Furthermore, he states that he got an HIV at the [facility] that was negative. He endorses persistent scrotal tenderness especially over the swellings that is aggravated when lifting "boxes" or if his scrotum moves a lot while wearing boxer underwear.  ED course: Found to be neutropenic and febrile with Tmx 102.1, received I Liter NS IVF, Vancomycin 1 g x1, Levofloxacin 750 mg x1 and  Zofran  Meds: Current Outpatient Rx  Name Route Sig Dispense Refill  . DIPHENHYDRAMINE HCL 25 MG PO CAPS Oral Take 25 mg by mouth every 6 (six) hours as needed. For allergies    . IBUPROFEN 200 MG PO TABS Oral Take 200 mg by mouth every 6 (six) hours as needed. For pain    . ADULT MULTIVITAMIN W/MINERALS CH Oral Take 1 tablet by mouth daily.      Allergies: Allergies as of 05/01/2012 - Review Complete 05/01/2012  Allergen Reaction Noted  . Penicillins Other (See Comments) 03/30/2012   History reviewed. No pertinent past medical history. History reviewed. No pertinent past surgical history. History reviewed. No pertinent family history. History   Social History  . Marital Status: Single    Spouse Name: N/A    Number of Children: N/A  . Years of Education: N/A   Occupational History  . Not on file.   Social History Main Topics  . Smoking status: Former Games developer  . Smokeless tobacco: Not on file  . Alcohol Use: No  . Drug Use: No  . Sexually Active:    Other Topics Concern  . Not on file   Social History Narrative  . No narrative on file    Review of Systems: Constitutional:  Endorse subjective fever, chills, appetite change and fatigue. Denies appetite change.  HEENT: Denies cold-like symptoms including rhinorrhea  Respiratory: Endorses DOE, Denies SOB at rest, cough, and chest tightness  Cardiovascular: Denies chest pain, palpitations and leg swelling.  Gastrointestinal: Endorses nausea, vomiting x1, Denies abdominal pain, diarrhea, constipation, blood in stool and abdominal distention.  Genitourinary: Endorses dark urine and slight burning on urination, scrotum feels "heavy", pain in scrotum on movement and palpation, Denies dysuria, urgency, frequency, hematuria, flank pain and difficulty urinating.  Musculoskeletal: Denies myalgias  Skin: Denies rash   Neurological: Endorses weakness, Denies dizziness, syncope, light-headedness, and headaches.      Physical  Exam: Blood pressure 104/59, pulse 61, temperature 98.5 F (36.9 C), temperature source Oral, resp. rate 16, SpO2 100.00%. General: Well-developed, well-nourished, in no acute distress; lying flat on stretcher, girlfriend at bedside Head: Normocephalic, atraumatic. Eyes: PERRLA, EOMI, No signs of anemia or jaundice. Throat: Oropharynx nonerythematous, no exudate appreciated.  Neck: supple, no masses, no cervical LAD appreciated Lungs: Normal respiratory effort. Clear to auscultation bilaterally from apices to bases without crackles or wheezes appreciated. Heart: normal rate, regular rhythm, normal S1 and S2, no gallop, murmur, or rubs appreciated. Abdomen: BS normoactive. Soft, Nondistended, non-tender. No masses or organomegaly appreciated. GU: epididymis non-tender to palpation, 3 palpable swellings within scrotum which reduce lying down, mildly tender, normal testicular lie, normal cremasteric reflex Extremities: no inguinal LAD appreciated, femoral pulses intact, No pretibial edema, distal pulses intact Neurologic: grossly non-focal, alert and oriented x3, appropriate and cooperative throughout examination. Skin: no rashes noted, mildly diaphoretic to the touch   Lab results: Basic Metabolic Panel:  [facility] 05/01/12 0804  NA 133*  K 4.0  CL 98  CO2 26  GLUCOSE 103*  BUN 8  CREATININE 1.19  CALCIUM 9.2  MG --  PHOS --   CBC:  Basename 05/01/12 0804  WBC 2.2*  NEUTROABS 1.5*  HGB 14.9  HCT 41.3  MCV 83.4  PLT 135*   Urinalysis:  Basename 05/01/12 0835  COLORURINE YELLOW  LABSPEC 1.015  PHURINE 8.0  GLUCOSEU NEGATIVE  HGBUR NEGATIVE  BILIRUBINUR NEGATIVE  KETONESUR NEGATIVE  PROTEINUR NEGATIVE  UROBILINOGEN 1.0  NITRITE NEGATIVE  LEUKOCYTESUR NEGATIVE   Misc. Labs: HIV pending RPR pending  Imaging results:  Dg Chest 2 View  05/01/2012  *RADIOLOGY REPORT*  Clinical Data: Fever  CHEST - 2 VIEW  Comparison: 03/28/2010  Findings: Lungs are essentially  clear.  No pleural effusion or pneumothorax.  Cardiomediastinal silhouette is within normal limits.  Visualized osseous structures are within normal limits.  IMPRESSION: Normal chest radiographs.  Original Report Authenticated By: Charline Bills, M.D.   US Scrotum  05/01/2012  *RADIOLOGY REPORT*  Clinical Data:  1 cm firm tender scrotal mass, bilateral scrotal swelling since basketball injury 1 month ago  SCROTAL ULTRASOUND DOPPLER ULTRASOUND OF THE TESTICLES  Technique: Complete ultrasound examination of the testicles, epididymis, and other scrotal structures was performed.  Color and spectral Doppler ultrasound were also utilized to evaluate blood flow to the testicles.  Comparison:  None  Findings:  Right testis:  Normal in size, measuring 3.7 x 2.3 x 2.2 cm. Limited microlithiasis.  Left testis:  Normal in size, measuring 3.2 x 2.0 x 2.9 cm. Limited microlithiasis.  Right epididymis:  2.4 x 2.2 x 2.2 cm epididymal head cyst/spermatocele.  Left epididymis:  Normal in size and appearance, noting small less than 5 mm epididymal head cysts.  Hydrocele:  Moderate complex hydroceles bilaterally.  Suspected small left scrotal pearls.  Varicocele:  Present bilaterally.  Pulsed Doppler interrogation of both testes demonstrates low resistance flow bilaterally.  Additional comments: 1.5 x 1.0 x 1.5 cm heterogeneous solid lesion with well-circumscribed margins and coarse central calcification in the left  scrotal wall.  IMPRESSION: 1.5 cm well-circumscribed solid lesion in the left scrotal wall. While indeterminate, the sonographic appearance and location favors a benign lesion, possibly the sequela of prior trauma.  If this reflects a new palpable abnormality, consider biopsy or follow-up ultrasound in 6 months.  2.4 cm right epididymal head cyst/spermatocele.  Bilateral complex hydroceles.  Bilateral varicoceles.  Left scrotal pearls.  Original Report Authenticated By: Charline Bills, M.D.   [place] Art/ven Flow Abd  Pelv Doppler  05/01/2012  *RADIOLOGY REPORT*  Clinical Data:  1 cm firm tender scrotal mass, bilateral scrotal swelling since basketball injury 1 month ago  SCROTAL ULTRASOUND DOPPLER ULTRASOUND OF THE TESTICLES  Technique: Complete ultrasound examination of the testicles, epididymis, and other scrotal structures was performed.  Color and spectral Doppler ultrasound were also utilized to evaluate blood flow to the testicles.  Comparison:  None  Findings:  Right testis:  Normal in size, measuring 3.7 x 2.3 x 2.2 cm. Limited microlithiasis.  Left testis:  Normal in size, measuring 3.2 x 2.0 x 2.9 cm. Limited microlithiasis.  Right epididymis:  2.4 x 2.2 x 2.2 cm epididymal head cyst/spermatocele.  Left epididymis:  Normal in size and appearance, noting small less than 5 mm epididymal head cysts.  Hydrocele:  Moderate complex hydroceles bilaterally.  Suspected small left scrotal pearls.  Varicocele:  Present bilaterally.  Pulsed Doppler interrogation of both testes demonstrates low resistance flow bilaterally.  Additional comments: 1.5 x 1.0 x 1.5 cm heterogeneous solid lesion with well-circumscribed margins and coarse central calcification in the left scrotal wall.  IMPRESSION: 1.5 cm well-circumscribed solid lesion in the left scrotal wall. While indeterminate, the sonographic appearance and location favors a benign lesion, possibly the sequela of prior trauma.  If this reflects a new palpable abnormality, consider biopsy or follow-up ultrasound in 6 months.  2.4 cm right epididymal head cyst/spermatocele.  Bilateral complex hydroceles.  Bilateral varicoceles.  Left scrotal pearls.  Original Report Authenticated By: Charline Bills, M.D.    Assessment & Plan by Problem: 41 yo male without significant past medical history admitted with fever, neutropenia and thrombocytopenia.  1. Neutropenic fever: mild with Abs Neutro 1.5, in setting of thrombocytopenia (135 on admission).  No source of infection thus far  with negative urine, denies tick bite but does work outdoors near in grass and bushes - blood culture pending - check HIV and RNA viral load -check Ehrlichiosis panel and [facility] Spotted Fever Antibody given fever, malaise, nausea, vomiting -give empiric doxycycline 100 mg bid -cont NS IVF 100 cc/f -check liver function panel -check GC/Chlamydia urine probe  Lab 05/01/12 1634 05/01/12 0804  PLT 127* 135*    2. 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 Varicoceles usually do not require treatment unless significantly painful or associated with infertility or decreased sperm quality. The primary form of treatment is surgery (surgical ligation vs venous embolization) but patient is w/o insurance.  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  Epididymal cysts/spermatoceles are usually asymptomatic and do not require further evaluation. Significance of testicular microlithiasis is unclear at this point as no tumor or neoplasm is evident -advise scrotal support and NSAIDs -consult urology for further recommendations   3. Scrotal wall mass: clinical picture consistent with benign lesion given sonographic appearance and location and reported recent h/o scrotal trauma from basketball injury -f/u ultrasound in 6  months vs biopsy -CT of pelvis down to perineum in the morning -appreciate Urology recommendations  Signed: Kristie Cowman 05/01/2012, 2:03 PM

## 2012-05-01 NOTE — ED Notes (Signed)
Patient has had onset of nausea/voming at this time.  Pt denies any SOB.  No rash noted.  Rate of medication was slowed down at this time.

## 2012-05-01 NOTE — Progress Notes (Signed)
Patient  Admitted to [facility] Alert and oriented x4 oriented to nursing unit. No distress noted. Patient ambulated down the hallways to nursing station and back to room. Gait steady and patient made a low fall risk.  Patient viewed safety video. Patient skin intact and denies pain. He is a full code.  Confirmed with Peggy in Infection Prevention patient doesn't need to be on any isolation. For [place] Spotted fever.

## 2012-05-01 NOTE — ED Provider Notes (Signed)
History    41 year old male with fever and general malaise. Gradual onset about 3 or 4 days ago. Feels achy all over. No cough or shortness of breath. No sore throat. No unusual rash. No abdominal pain. Mild nausea, but no vomiting. No urinary complaints, the patient has had a small painful lump in the scrotum which has been there for several weeks. Had trauma while playing basketball just prior to noticing it. No penile discharge. No unusual swelling.  CSN: [id number]  Arrival date & time 05/01/12  5784   First MD Initiated Contact with Patient 05/01/12 0745      Chief Complaint  Patient presents with  . Fever    (Consider location/radiation/quality/duration/timing/severity/associated sxs/prior treatment) HPI  History reviewed. No pertinent past medical history.  History reviewed. No pertinent past surgical history.  History reviewed. No pertinent family history.  History  Substance Use Topics  . Smoking status: Former Games developer  . Smokeless tobacco: Not on file  . Alcohol Use: No      Review of Systems   Review of symptoms negative unless otherwise noted in HPI.   Allergies  Penicillins  Home Medications   Current Outpatient Rx  Name Route Sig Dispense Refill  . DIPHENHYDRAMINE HCL 25 MG PO CAPS Oral Take 25 mg by mouth every 6 (six) hours as needed. For allergies    . IBUPROFEN 200 MG PO TABS Oral Take 200 mg by mouth every 6 (six) hours as needed. For pain    . ADULT MULTIVITAMIN W/MINERALS CH Oral Take 1 tablet by mouth daily.      BP 126/73  Pulse 96  Temp 100.9 F (38.3 C) (Oral)  Resp 20  SpO2 100%  Physical Exam  Nursing note and vitals reviewed. Constitutional: He appears well-developed and well-nourished. No distress.  HENT:  Head: Normocephalic and atraumatic.  Eyes: Conjunctivae are normal. Right eye exhibits no discharge. Left eye exhibits no discharge.  Neck: Neck supple.  Cardiovascular: Normal rate, regular rhythm and normal heart sounds.   Exam reveals no gallop and no friction rub.   No murmur heard. Pulmonary/Chest: Effort normal and breath sounds normal. No respiratory distress.  Abdominal: Soft. He exhibits no distension. There is no tenderness.  Genitourinary:       Normal external genitalia. Normal lie to testicles. No inguinal hernia. No inguinal adenopathy. ~1cm firm mobile and mildly tender scrotal mass.  Musculoskeletal: He exhibits no edema and no tenderness.  Neurological: He is alert.  Skin: Skin is warm and dry.  Psychiatric: He has a normal mood and affect. His behavior is normal. Thought content normal.    ED Course  Procedures (including critical care time)  Labs Reviewed  CBC - Abnormal; Notable for the following:    WBC 2.2 (*)     MCHC 36.1 (*)     Platelets 135 (*)     All other components within normal limits  BASIC METABOLIC PANEL - Abnormal; Notable for the following:    Sodium 133 (*)     Glucose, Bld 103 (*)     GFR calc non Af Amer 73 (*)     GFR calc Af Amer 85 (*)     All other components within normal limits  DIFFERENTIAL - Abnormal; Notable for the following:    Neutro Abs 1.5 (*)     Lymphs Abs 0.6 (*)     All other components within normal limits  URINALYSIS, ROUTINE W REFLEX MICROSCOPIC  CULTURE, BLOOD (ROUTINE X 2)  CULTURE,  BLOOD (ROUTINE X 2)  HIV ANTIBODY (ROUTINE TESTING)  RPR   Dg Chest 2 View  05/01/2012  *RADIOLOGY REPORT*  Clinical Data: Fever  CHEST - 2 VIEW  Comparison: 03/28/2010  Findings: Lungs are essentially clear.  No pleural effusion or pneumothorax.  Cardiomediastinal silhouette is within normal limits.  Visualized osseous structures are within normal limits.  IMPRESSION: Normal chest radiographs.  Original Report Authenticated By: Charline Bills, M.D.   US Scrotum  05/01/2012  *RADIOLOGY REPORT*  Clinical Data:  1 cm firm tender scrotal mass, bilateral scrotal swelling since basketball injury 1 month ago  SCROTAL ULTRASOUND DOPPLER ULTRASOUND OF THE  TESTICLES  Technique: Complete ultrasound examination of the testicles, epididymis, and other scrotal structures was performed.  Color and spectral Doppler ultrasound were also utilized to evaluate blood flow to the testicles.  Comparison:  None  Findings:  Right testis:  Normal in size, measuring 3.7 x 2.3 x 2.2 cm. Limited microlithiasis.  Left testis:  Normal in size, measuring 3.2 x 2.0 x 2.9 cm. Limited microlithiasis.  Right epididymis:  2.4 x 2.2 x 2.2 cm epididymal head cyst/spermatocele.  Left epididymis:  Normal in size and appearance, noting small less than 5 mm epididymal head cysts.  Hydrocele:  Moderate complex hydroceles bilaterally.  Suspected small left scrotal pearls.  Varicocele:  Present bilaterally.  Pulsed Doppler interrogation of both testes demonstrates low resistance flow bilaterally.  Additional comments: 1.5 x 1.0 x 1.5 cm heterogeneous solid lesion with well-circumscribed margins and coarse central calcification in the left scrotal wall.  IMPRESSION: 1.5 cm well-circumscribed solid lesion in the left scrotal wall. While indeterminate, the sonographic appearance and location favors a benign lesion, possibly the sequela of prior trauma.  If this reflects a new palpable abnormality, consider biopsy or follow-up ultrasound in 6 months.  2.4 cm right epididymal head cyst/spermatocele.  Bilateral complex hydroceles.  Bilateral varicoceles.  Left scrotal pearls.  Original Report Authenticated By: Charline Bills, M.D.   [place] Art/ven Flow Abd Pelv Doppler  05/01/2012  *RADIOLOGY REPORT*  Clinical Data:  1 cm firm tender scrotal mass, bilateral scrotal swelling since basketball injury 1 month ago  SCROTAL ULTRASOUND DOPPLER ULTRASOUND OF THE TESTICLES  Technique: Complete ultrasound examination of the testicles, epididymis, and other scrotal structures was performed.  Color and spectral Doppler ultrasound were also utilized to evaluate blood flow to the testicles.  Comparison:  None  Findings:   Right testis:  Normal in size, measuring 3.7 x 2.3 x 2.2 cm. Limited microlithiasis.  Left testis:  Normal in size, measuring 3.2 x 2.0 x 2.9 cm. Limited microlithiasis.  Right epididymis:  2.4 x 2.2 x 2.2 cm epididymal head cyst/spermatocele.  Left epididymis:  Normal in size and appearance, noting small less than 5 mm epididymal head cysts.  Hydrocele:  Moderate complex hydroceles bilaterally.  Suspected small left scrotal pearls.  Varicocele:  Present bilaterally.  Pulsed Doppler interrogation of both testes demonstrates low resistance flow bilaterally.  Additional comments: 1.5 x 1.0 x 1.5 cm heterogeneous solid lesion with well-circumscribed margins and coarse central calcification in the left scrotal wall.  IMPRESSION: 1.5 cm well-circumscribed solid lesion in the left scrotal wall. While indeterminate, the sonographic appearance and location favors a benign lesion, possibly the sequela of prior trauma.  If this reflects a new palpable abnormality, consider biopsy or follow-up ultrasound in 6 months.  2.4 cm right epididymal head cyst/spermatocele.  Bilateral complex hydroceles.  Bilateral varicoceles.  Left scrotal pearls.  Original Report Authenticated By: Lurlean Horns  Rito Ehrlich, M.D.     1. Neutropenic fever       MDM  41 year old male with fever. Source is unclear at this time based on history, exam or workup in the emergency department. Incidentally noted neutropenia. Patient with no significant past medical history. Per review of recent ER note, patient expressed a concern for possible HIV because of some high risk behaviors. He was referred to the health department for testing which he did not followup with. His HIV status is unknown at this time. ntibiotics with quinolone and vancomycin were ordered given patient's history of penicillin allergy.        Raeford Razor, MD 05/06/12 [phone]

## 2012-05-01 NOTE — ED Notes (Signed)
Called [phone] to give report.  Receiving RN unable to take report at this time.

## 2012-05-01 NOTE — ED Notes (Signed)
Patient states he has had a fever x 4 days.  Pt states that he has knots on his testicles. Patient has had increase in pain to same.  Pt states that he has had n/v with fever.  Pt has had body aches as well.

## 2012-05-01 NOTE — ED Notes (Signed)
Patient transported to X-ray 

## 2012-05-02 ENCOUNTER — Observation Stay (HOSPITAL_COMMUNITY): Payer: Self-pay

## 2012-05-02 LAB — CBC
Hemoglobin: 14.4 g/dL (ref 13.0–17.0)
RBC: 4.88 MIL/uL (ref 4.22–5.81)
WBC: 1.6 10*3/uL — ABNORMAL LOW (ref 4.0–10.5)

## 2012-05-02 LAB — COMPREHENSIVE METABOLIC PANEL
ALT: 60 U/L — ABNORMAL HIGH (ref 0–53)
Alkaline Phosphatase: 87 U/L (ref 39–117)
CO2: 24 mEq/L (ref 19–32)
Chloride: 100 mEq/L (ref 96–112)
GFR calc Af Amer: >90 mL/min (ref >90)
GFR calc non Af Amer: 79 mL/min — ABNORMAL LOW (ref >90)
Glucose, Bld: 90 mg/dL (ref 70–99)
Potassium: 3.8 mEq/L (ref 3.5–5.1)
Sodium: 134 mEq/L — ABNORMAL LOW (ref 135–145)

## 2012-05-02 LAB — DIFFERENTIAL
Basophils Absolute: 0 10*3/uL (ref 0.0–0.1)
Lymphs Abs: 0.6 10*3/uL — ABNORMAL LOW (ref 0.7–4.0)
Monocytes Absolute: 0.2 10*3/uL (ref 0.1–1.0)
Neutro Abs: 0.8 10*3/uL — ABNORMAL LOW (ref 1.7–7.7)
WBC Morphology: INCREASED

## 2012-05-02 LAB — EHRLICHIA ANTIBODY PANEL: E chaffeensis (HGE) Ab, IgM: NEGATIVE

## 2012-05-02 MED ORDER — IOHEXOL 300 MG/ML  SOLN
100.0000 mL | Freq: Once | INTRAMUSCULAR | Status: AC | PRN
  Administered 2012-05-02: 100 mL via INTRAVENOUS

## 2012-05-02 MED ORDER — IOHEXOL 300 MG/ML  SOLN
20.0000 mL | INTRAMUSCULAR | Status: AC
  Administered 2012-05-02 (×2): 20 mL via ORAL
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
## 2012-05-02 NOTE — Progress Notes (Signed)
Utilization review complete 

## 2012-05-02 NOTE — Progress Notes (Signed)
PT had an elevated temperature of 102.4. Pt was given 650 mg of tylenol and temperature was rechecked. Temperature moved down to 100.4. Pt was also given a cool compress. RN will continue to monitor pt.

## 2012-05-02 NOTE — Progress Notes (Addendum)
Subjective: No acute events overnight, continues to be febrile  Objective: Vital signs in last 24 hours: Filed Vitals:   05/01/12 2300 05/02/12 0255 05/02/12 0354 05/02/12 0516  BP: 107/70   101/64  Pulse: 88   85  Temp: 99.4 F (37.4 C) 102.4 F (39.1 C) 100.4 F (38 C) 98.5 F (36.9 C)  TempSrc: Oral Oral Oral Oral  Resp: 18   16  Height:      Weight:      SpO2: 99%   97%   Weight change:   Intake/Output Summary (Last 24 hours) at 05/02/12 1256 Last data filed at 05/02/12 1610  Gross per 24 hour  Intake 2213.33 ml  Output   1025 ml  Net 1188.33 ml   Physical Exam: General: Well-developed, well-nourished, in no acute distress; lying flat on stretcher, girlfriend at bedside Head: Normocephalic, atraumatic. Lungs: Normal respiratory effort. Clear to auscultation bilaterally from apices to bases without crackles or wheezes appreciated. Heart: normal rate, regular rhythm, normal S1 and S2, no gallop, murmur, or rubs appreciated. Abdomen: BS normoactive. Soft, Nondistended, non-tender. No masses or organomegaly appreciated.  Neurologic: grossly non-focal, alert and oriented x3, appropriate and cooperative throughout examination.  Skin: no rashes noted   Lab Results: Basic Metabolic Panel:  Lab 05/02/12 9604 05/01/12 1634 05/01/12 0804  NA 134* -- 133*  K 3.8 -- 4.0  CL 100 -- 98  CO2 24 -- 26  GLUCOSE 90 -- 103*  BUN 6 -- 8  CREATININE 1.12 1.07 --  CALCIUM 8.7 -- 9.2  MG -- -- --  PHOS -- -- --   Liver Function Tests:  Lab 05/02/12 0505 05/01/12 1635  AST 85* 64*  ALT 60* 51  ALKPHOS 87 79  BILITOT 0.6 0.6  PROT 6.7 7.1  ALBUMIN 3.2* 3.3*   CBC:  Lab 05/02/12 0505 05/01/12 1634 05/01/12 0804  WBC 1.6* 1.5* --  NEUTROABS 0.8* -- 1.5*  HGB 14.4 15.4 --  HCT 40.8 44.1 --  MCV 83.6 85.1 --  PLT 126* 127* --   Urinalysis:  Lab 05/01/12 0835  COLORURINE YELLOW  LABSPEC 1.015  PHURINE 8.0  GLUCOSEU NEGATIVE  HGBUR NEGATIVE  BILIRUBINUR NEGATIVE    KETONESUR NEGATIVE  PROTEINUR NEGATIVE  UROBILINOGEN 1.0  NITRITE NEGATIVE  LEUKOCYTESUR NEGATIVE     Micro Results: No results found for this or any previous visit (from the past 240 hour(s)). Studies/Results: Dg Chest 2 View  05/01/2012  *RADIOLOGY REPORT*  Clinical Data: Fever  CHEST - 2 VIEW  Comparison: 03/28/2010  Findings: Lungs are essentially clear.  No pleural effusion or pneumothorax.  Cardiomediastinal silhouette is within normal limits.  Visualized osseous structures are within normal limits.  IMPRESSION: Normal chest radiographs.  Original Report Authenticated By: Charline Bills, M.D.   US Scrotum  05/01/2012  *RADIOLOGY REPORT*  Clinical Data:  1 cm firm tender scrotal mass, bilateral scrotal swelling since basketball injury 1 month ago  SCROTAL ULTRASOUND DOPPLER ULTRASOUND OF THE TESTICLES  Technique: Complete ultrasound examination of the testicles, epididymis, and other scrotal structures was performed.  Color and spectral Doppler ultrasound were also utilized to evaluate blood flow to the testicles.  Comparison:  None  Findings:  Right testis:  Normal in size, measuring 3.7 x 2.3 x 2.2 cm. Limited microlithiasis.  Left testis:  Normal in size, measuring 3.2 x 2.0 x 2.9 cm. Limited microlithiasis.  Right epididymis:  2.4 x 2.2 x 2.2 cm epididymal head cyst/spermatocele.  Left epididymis:  Normal in size  and appearance, noting small less than 5 mm epididymal head cysts.  Hydrocele:  Moderate complex hydroceles bilaterally.  Suspected small left scrotal pearls.  Varicocele:  Present bilaterally.  Pulsed Doppler interrogation of both testes demonstrates low resistance flow bilaterally.  Additional comments: 1.5 x 1.0 x 1.5 cm heterogeneous solid lesion with well-circumscribed margins and coarse central calcification in the left scrotal wall.  IMPRESSION: 1.5 cm well-circumscribed solid lesion in the left scrotal wall. While indeterminate, the sonographic appearance and location  favors a benign lesion, possibly the sequela of prior trauma.  If this reflects a new palpable abnormality, consider biopsy or follow-up ultrasound in 6 months.  2.4 cm right epididymal head cyst/spermatocele.  Bilateral complex hydroceles.  Bilateral varicoceles.  Left scrotal pearls.  Original Report Authenticated By: Charline Bills, M.D.   [place] Art/ven Flow Abd Pelv Doppler  05/01/2012  *RADIOLOGY REPORT*  Clinical Data:  1 cm firm tender scrotal mass, bilateral scrotal swelling since basketball injury 1 month ago  SCROTAL ULTRASOUND DOPPLER ULTRASOUND OF THE TESTICLES  Technique: Complete ultrasound examination of the testicles, epididymis, and other scrotal structures was performed.  Color and spectral Doppler ultrasound were also utilized to evaluate blood flow to the testicles.  Comparison:  None  Findings:  Right testis:  Normal in size, measuring 3.7 x 2.3 x 2.2 cm. Limited microlithiasis.  Left testis:  Normal in size, measuring 3.2 x 2.0 x 2.9 cm. Limited microlithiasis.  Right epididymis:  2.4 x 2.2 x 2.2 cm epididymal head cyst/spermatocele.  Left epididymis:  Normal in size and appearance, noting small less than 5 mm epididymal head cysts.  Hydrocele:  Moderate complex hydroceles bilaterally.  Suspected small left scrotal pearls.  Varicocele:  Present bilaterally.  Pulsed Doppler interrogation of both testes demonstrates low resistance flow bilaterally.  Additional comments: 1.5 x 1.0 x 1.5 cm heterogeneous solid lesion with well-circumscribed margins and coarse central calcification in the left scrotal wall.  IMPRESSION: 1.5 cm well-circumscribed solid lesion in the left scrotal wall. While indeterminate, the sonographic appearance and location favors a benign lesion, possibly the sequela of prior trauma.  If this reflects a new palpable abnormality, consider biopsy or follow-up ultrasound in 6 months.  2.4 cm right epididymal head cyst/spermatocele.  Bilateral complex hydroceles.  Bilateral  varicoceles.  Left scrotal pearls.  Original Report Authenticated By: Charline Bills, M.D.   Medications: I have reviewed the patient's current medications. Scheduled Meds:   . doxycycline  100 mg Oral Q12H  . enoxaparin  40 mg Subcutaneous Q24H  . iohexol  20 mL Oral Q1 Hr x 2  . ondansetron (ZOFRAN) IV  4 mg Intravenous Once  . sodium chloride  3 mL Intravenous Q12H  . DISCONTD: levofloxacin (LEVAQUIN) IV  750 mg Intravenous Daily   Continuous Infusions:   . sodium chloride 100 mL/hr at 05/02/12 0653   PRN Meds:.sodium chloride, acetaminophen, acetaminophen, HYDROcodone-acetaminophen, ondansetron (ZOFRAN) IV, ondansetron, sodium chloride, DISCONTD: ondansetron (ZOFRAN) IV  Assessment/Plan: 41 yo male without significant past medical history admitted with fever, neutropenia and thrombocytopenia.   1. Neutropenic fever: mild, in setting of thrombocytopenia, today pt thinks that he could have had tick on his scalp which he assumed was a scabbed but not for certain No source of infection thus far with negative urine, HIV and RPR nonreactive,  - blood culture pending  - Ehrlichiosis panel and [facility] Spotted Fever Antibody pending - contempiric doxycycline 100 mg bid  -cont NS IVF 100 cc/f  -check Hepatitis panel -GC/Chlamydia urine  probe pending   Lab 05/02/12 0505 05/01/12 1634 05/01/12 0804  PLT 126* 127* 135*     2. 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 Scrotal wall mass: likely benign secondary to trauma -CT of pelvis down to perineum today -appreciate Urology recommendations -f/u Urology as outpt if needed if CT w/o findings   LOS: 1 day   Versia Mignogna 05/02/2012, 12:56 PM

## 2012-05-02 NOTE — Progress Notes (Signed)
Patients CT scan shows some periportal lymphadenopathy , prev found scrotal changes seen on [place].  His Ehrlichia and RMSF IgM antibodies (acute) are negative.  NOTE THESE LATTER LAB VALUES BY NO MEANS RULE OUT INFECTION WITH EHRLICHIA OR RMSF.  ONLY WORTHWHILE TEST ACUTELY IS RESPONSE TO DOXYCLINE WHICH SHOULD BE CONTINUED IN THIS PATIENT. WE CAN CERTAINLY REPEAT CONVALESCENT RMSF AND EHRLICHIA TITERS IN 2+ WEEKS.

## 2012-05-03 ENCOUNTER — Encounter (HOSPITAL_COMMUNITY): Payer: Self-pay | Admitting: Radiology

## 2012-05-03 ENCOUNTER — Inpatient Hospital Stay (HOSPITAL_COMMUNITY): Payer: Self-pay

## 2012-05-03 LAB — CBC WITH DIFFERENTIAL/PLATELET
Basophils Relative: 3 % — ABNORMAL HIGH (ref 0–1)
Eosinophils Relative: 0 % (ref 0–5)
Hemoglobin: 14.5 g/dL (ref 13.0–17.0)
MCH: 30 pg (ref 26.0–34.0)
MCV: 83.3 fL (ref 78.0–100.0)
Monocytes Absolute: 0.1 10*3/uL (ref 0.1–1.0)
Monocytes Relative: 7 % (ref 3–12)
Neutrophils Relative %: 27 % — ABNORMAL LOW (ref 43–77)
RBC: 4.84 MIL/uL (ref 4.22–5.81)
WBC: 1.8 10*3/uL — ABNORMAL LOW (ref 4.0–10.5)

## 2012-05-03 LAB — HIV-1 RNA QUANT-NO REFLEX-BLD: HIV-1 RNA Quant, Log: <1.3 {Log} (ref <1.30)

## 2012-05-03 LAB — PROTIME-INR: Prothrombin Time: 16.3 seconds — ABNORMAL HIGH (ref 11.6–15.2)

## 2012-05-03 LAB — LACTATE DEHYDROGENASE: LDH: 479 U/L — ABNORMAL HIGH (ref 94–250)

## 2012-05-03 LAB — HEPATITIS PANEL, ACUTE: Hep B C IgM: NEGATIVE

## 2012-05-03 MED ORDER — IOHEXOL 300 MG/ML  SOLN
80.0000 mL | Freq: Once | INTRAMUSCULAR | Status: AC | PRN
  Administered 2012-05-03: 80 mL via INTRAVENOUS

## 2012-05-03 MED ORDER — TUBERCULIN PPD 5 UNIT/0.1ML ID SOLN
5.0000 [IU] | Freq: Once | INTRADERMAL | Status: DC
  Filled 2012-05-03: qty 0.1
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
## 2012-05-03 NOTE — Progress Notes (Signed)
Subjective: No acute events overnight, continues to be febrile  Objective: Vital signs in last 24 hours: Filed Vitals:   05/02/12 1942 05/02/12 2107 05/03/12 0508 05/03/12 0628  BP:  99/64 114/71   Pulse:  68 69   Temp: 100.5 F (38.1 C) 99.4 F (37.4 C) 103.1 F (39.5 C) 99 F (37.2 C)  TempSrc: Oral Oral Oral Oral  Resp:  20 20   Height:      Weight:      SpO2:  92% 100%    Weight change:   Intake/Output Summary (Last 24 hours) at 05/03/12 1315 Last data filed at 05/03/12 1031  Gross per 24 hour  Intake   2160 ml  Output   2200 ml  Net    -40 ml   Physical Exam: General: Well-developed, well-nourished, in no acute distress; lying flat on stretcher, girlfriend at bedside Head: Normocephalic, atraumatic. Lungs: Normal respiratory effort. Clear to auscultation bilaterally from apices to bases without crackles or wheezes appreciated. Heart: normal rate, regular rhythm, normal S1 and S2, no gallop, murmur, or rubs appreciated. Abdomen: BS normoactive. Soft, Nondistended, non-tender. No masses or organomegaly appreciated.  Neurologic: grossly non-focal, alert and oriented x3, appropriate and cooperative throughout examination.  Skin: no rashes noted   Lab Results: Basic Metabolic Panel:  Lab 05/02/12 1610 05/01/12 1634 05/01/12 0804  NA 134* -- 133*  K 3.8 -- 4.0  CL 100 -- 98  CO2 24 -- 26  GLUCOSE 90 -- 103*  BUN 6 -- 8  CREATININE 1.12 1.07 --  CALCIUM 8.7 -- 9.2  MG -- -- --  PHOS -- -- --   Liver Function Tests:  Lab 05/02/12 0505 05/01/12 1635  AST 85* 64*  ALT 60* 51  ALKPHOS 87 79  BILITOT 0.6 0.6  PROT 6.7 7.1  ALBUMIN 3.2* 3.3*   CBC:  Lab 05/03/12 0918 05/02/12 0505  WBC 1.8* 1.6*  NEUTROABS 0.5* 0.8*  HGB 14.5 14.4  HCT 40.3 40.8  MCV 83.3 83.6  PLT 112* 126*   Urinalysis:  Lab 05/01/12 0835  COLORURINE YELLOW  LABSPEC 1.015  PHURINE 8.0  GLUCOSEU NEGATIVE  HGBUR NEGATIVE  BILIRUBINUR NEGATIVE  KETONESUR NEGATIVE  PROTEINUR  NEGATIVE  UROBILINOGEN 1.0  NITRITE NEGATIVE  LEUKOCYTESUR NEGATIVE     Micro Results: Recent Results (from the past 240 hour(s))  CULTURE, BLOOD (ROUTINE X 2)     Status: Normal (Preliminary result)   Collection Time   05/01/12 10:00 AM      Component Value Range Status Comment   Specimen Description BLOOD LEFT ARM   Final    Special Requests BOTTLES DRAWN AEROBIC AND ANAEROBIC 10CC   Final    Culture  Setup Time [id number]   Final    Culture     Final    Value:        BLOOD CULTURE RECEIVED NO GROWTH TO DATE CULTURE WILL BE HELD FOR 5 DAYS BEFORE ISSUING A FINAL NEGATIVE REPORT   Report Status PENDING   Incomplete   CULTURE, BLOOD (ROUTINE X 2)     Status: Normal (Preliminary result)   Collection Time   05/01/12 10:05 AM      Component Value Range Status Comment   Specimen Description BLOOD LEFT ARM   Final    Special Requests BOTTLES DRAWN AEROBIC AND ANAEROBIC 10CC   Final    Culture  Setup Time [id number]   Final    Culture     Final    Value:  BLOOD CULTURE RECEIVED NO GROWTH TO DATE CULTURE WILL BE HELD FOR 5 DAYS BEFORE ISSUING A FINAL NEGATIVE REPORT   Report Status PENDING   Incomplete    Studies/Results: Ct Chest W Contrast  05/03/2012  *RADIOLOGY REPORT*  Clinical Data: Fever unknown origin.  Shortness of breath.  CT CHEST WITH CONTRAST  Technique:  Multidetector CT imaging of the chest was performed following the standard protocol during bolus administration of intravenous contrast.  Contrast: 80mL OMNIPAQUE IOHEXOL 300 MG/ML  SOLN  Comparison: None.  Findings: No evidence of mediastinal or hilar masses.  No lymphadenopathy is seen elsewhere within the thorax.  No evidence of pleural or pericardial effusion.  No evidence of pulmonary infiltrate or mass.  No central endobronchial lesion identified. No suspicious bone lesions are identified.  Visualized upper abdominal structures are unremarkable.  IMPRESSION: Negative.  No active disease or other significant  abnormality.  Original Report Authenticated By: Danae Orleans, M.D.   Ct Abdomen Pelvis W Contrast  05/02/2012  *RADIOLOGY REPORT*  Clinical Data: 4-day history of low grade fever with left scrotal pain and swelling.  CT ABDOMEN AND PELVIS WITH CONTRAST  Technique:  Multidetector CT imaging of the abdomen and pelvis was performed following the standard protocol during bolus administration of intravenous contrast.  Contrast:  100 ml Omnipaque-300  Comparison: Scrotal ultrasound dated 05/01/2012  Findings: The patient has prominent bilateral hydroceles to calcifications in the left side of the scrotum consistent with coronal coronals, benign.  There is slight thickening of the left side of the scrotum with focal thickening of the scrotum adjacent to the scrotal pearls.  There is no evidence of inflammation in the perineum. There are multiple small lymph nodes in the inguinal regions bilaterally with fatty hila consistent with benign reactive nodes.  The patient does have multiple enlarged lymph nodes in the periportal area extending into the hilum of the liver.  This is not at this specific but can be seen with hepatitis.  Liver parenchyma appears normal.  No biliary ductal dilatation.  Gallbladder is normal.  The spleen, pancreas, adrenal glands, and kidneys are normal. There is a tiny amount of ascites in the pelvis and in the left pericolic gutter, nonspecific.  The bowel appears normal including the terminal ileum and appendix. No periaortic adenopathy.  No osseous abnormalities.  Lung bases are clear.  IMPRESSION:  1.  Periportal and hilar adenopathy at the hilum of the liver, nonspecific but this can be seen with hepatitis. 2.  Small amount of nonspecific ascites. 3.  Bilateral hydroceles with focal areas of thickening of the scrotum.  I suspect this is related to remote trauma.  No evidence suggestive of active inflammation of the perineum.  Original Report Authenticated By: Gwynn Burly, M.D.    Medications: I have reviewed the patient's current medications. Scheduled Meds:    . doxycycline  100 mg Oral Q12H  . enoxaparin  40 mg Subcutaneous Q24H  . ondansetron (ZOFRAN) IV  4 mg Intravenous Once  . sodium chloride  3 mL Intravenous Q12H  . DISCONTD: tuberculin  5 Units Intradermal Once   Continuous Infusions:    . sodium chloride 100 mL/hr at 05/03/12 1234   PRN Meds:.sodium chloride, acetaminophen, acetaminophen, HYDROcodone-acetaminophen, iohexol, iohexol, ondansetron (ZOFRAN) IV, ondansetron, sodium chloride  Assessment/Plan: 41 yo male without significant past medical history admitted with fever, neutropenia and thrombocytopenia.   1. Neutropenic fever: progressed to severe, ANC 500 today (1599-->800-->500) in setting of thrombocytopenia and leukopenia, continues to be  febrile on doxcyxline, Ehrlichiosis panel and [facility] Spotted Fever Antibody neg, BCx neg thus far. Peripheral smear with atypical lymphocytes and >20% bands, s/p one dose Vancomycin and Levaquin in ED. Will need FUO work-up  - CBC pending -repeat blood culture  - cont doxycycline 100 mg bid  -cont NS IVF 100 cc/f  -Hepatitis panel pending -GC/Chlamydia urine probe pending -check ESR , CRP, LDH, Rheum Factor, CPK, ANA, SPEP  ACE level, ferritin, Quantiferon gold, EBV antibody panel, CMV IgG and IgM , Parvovirus IgG and IgM and Parvovirus PCR on blood,  Arbovirus panel from serum (including [place] Virus)  -CT of the chest today and -bone marrow biopsy with marrow sent for AFB, fungal, bacterial cultures as well as pathological examination scheduled for tomorrow -consider 2 d echo  Lab 05/03/12 0918 05/02/12 0505 05/01/12 1634 05/01/12 0804  PLT 112* 126* 127* 135*    2. 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 Scrotal wall mass: likely benign  secondary to trauma, CT of pelvis w/o abscess findings -appreciate Urology recommendations -f/u Urology as outpt if needed   LOS: 2 days   Agustina Witzke 05/03/2012, 1:15 PM

## 2012-05-03 NOTE — H&P (Signed)
Bruce [name] is an 41 y.o. male.   Chief Complaint: thrombocytopenia; FUO Scheduled for bone marrow biopsy in IR 6/28 HPI: fever; leukocytosis  Past Medical History  Diagnosis Date  . No pertinent past medical history     Past Surgical History  Procedure Date  . No past surgeries     History reviewed. No pertinent family history. Social History:  reports that he has been passively smoking Cigarettes.  He does not have any smokeless tobacco history on file. He reports that he drinks about 2.4 ounces of alcohol per week. He reports that he does not use illicit drugs.  Allergies:  Allergies  Allergen Reactions  . Penicillins Other (See Comments)    Childhood reaction    Medications Prior to Admission  Medication Sig Dispense Refill  . diphenhydrAMINE (BENADRYL) 25 mg capsule Take 25 mg by mouth every 6 (six) hours as needed. For allergies      . ibuprofen (ADVIL,MOTRIN) 200 MG tablet Take 200 mg by mouth every 6 (six) hours as needed. For pain      . Multiple Vitamin (MULTIVITAMIN WITH MINERALS) TABS Take 1 tablet by mouth daily.        Results for orders placed during the hospital encounter of 05/01/12 (from the past 48 hour(s))  HIV ANTIBODY (ROUTINE TESTING)     Status: Normal   Collection Time   05/01/12 12:25 PM      Component Value Range Comment   HIV NON REACTIVE  NON REACTIVE   RPR     Status: Normal   Collection Time   05/01/12 12:25 PM      Component Value Range Comment   RPR NON REACTIVE  NON REACTIVE   EHRLICHIA ANTIBODY PANEL     Status: Normal   Collection Time   05/01/12  2:58 PM      Component Value Range Comment   E chaffeensis Ab, IgG Negative  Negative    E chaffeensis Ab, IgM Negative  Negative   ROCKY MTN SPOTTED FVR AB, IGM-BLOOD     Status: Normal   Collection Time   05/01/12  4:34 PM      Component Value Range Comment   RMSF IgM 0.41  0.00 - 0.89 IV   CBC     Status: Abnormal   Collection Time   05/01/12  4:34 PM      Component Value Range  Comment   WBC 1.5 (*) 4.0 - 10.5 K/uL    RBC 5.18  4.22 - 5.81 MIL/uL    Hemoglobin 15.4  13.0 - 17.0 g/dL    HCT 47.8  29.5 - 62.1 %    MCV 85.1  78.0 - 100.0 fL    MCH 29.7  26.0 - 34.0 pg    MCHC 34.9  30.0 - 36.0 g/dL    RDW 30.8  65.7 - 84.6 %    Platelets 127 (*) 150 - 400 K/uL   CREATININE, SERUM     Status: Abnormal   Collection Time   05/01/12  4:34 PM      Component Value Range Comment   Creatinine, Ser 1.07  0.50 - 1.35 mg/dL    GFR calc non Af Amer 83 (*) >90 mL/min    GFR calc Af Amer >90  >90 mL/min   HEPATIC FUNCTION PANEL     Status: Abnormal   Collection Time   05/01/12  4:35 PM      Component Value Range Comment   Total Protein 7.1  6.0 - 8.3 g/dL    Albumin 3.3 (*) 3.5 - 5.2 g/dL    AST 64 (*) 0 - 37 U/L    ALT 51  0 - 53 U/L    Alkaline Phosphatase 79  39 - 117 U/L    Total Bilirubin 0.6  0.3 - 1.2 mg/dL    Bilirubin, Direct 0.2  0.0 - 0.3 mg/dL    Indirect Bilirubin 0.4  0.3 - 0.9 mg/dL   CBC     Status: Abnormal   Collection Time   05/02/12  5:05 AM      Component Value Range Comment   WBC 1.6 (*) 4.0 - 10.5 K/uL    RBC 4.88  4.22 - 5.81 MIL/uL    Hemoglobin 14.4  13.0 - 17.0 g/dL    HCT 45.4  09.8 - 11.9 %    MCV 83.6  78.0 - 100.0 fL    MCH 29.5  26.0 - 34.0 pg    MCHC 35.3  30.0 - 36.0 g/dL    RDW 14.7  82.9 - 56.2 %    Platelets 126 (*) 150 - 400 K/uL   COMPREHENSIVE METABOLIC PANEL     Status: Abnormal   Collection Time   05/02/12  5:05 AM      Component Value Range Comment   Sodium 134 (*) 135 - 145 mEq/L    Potassium 3.8  3.5 - 5.1 mEq/L    Chloride 100  96 - 112 mEq/L    CO2 24  19 - 32 mEq/L    Glucose, Bld 90  70 - 99 mg/dL    BUN 6  6 - 23 mg/dL    Creatinine, Ser 1.30  0.50 - 1.35 mg/dL    Calcium 8.7  8.4 - 86.5 mg/dL    Total Protein 6.7  6.0 - 8.3 g/dL    Albumin 3.2 (*) 3.5 - 5.2 g/dL    AST 85 (*) 0 - 37 U/L    ALT 60 (*) 0 - 53 U/L    Alkaline Phosphatase 87  39 - 117 U/L    Total Bilirubin 0.6  0.3 - 1.2 mg/dL    GFR  calc non Af Amer 79 (*) >90 mL/min    GFR calc Af Amer >90  >90 mL/min   DIFFERENTIAL     Status: Abnormal   Collection Time   05/02/12  5:05 AM      Component Value Range Comment   Neutrophils Relative 49  43 - 77 %    Lymphocytes Relative 37  12 - 46 %    Monocytes Relative 12  3 - 12 %    Eosinophils Relative 0  0 - 5 %    Basophils Relative 2 (*) 0 - 1 %    Neutro Abs 0.8 (*) 1.7 - 7.7 K/uL    Lymphs Abs 0.6 (*) 0.7 - 4.0 K/uL    Monocytes Absolute 0.2  0.1 - 1.0 K/uL    Eosinophils Absolute 0.0  0.0 - 0.7 K/uL    Basophils Absolute 0.0  0.0 - 0.1 K/uL    WBC Morphology INCREASED BANDS (>20% BANDS)   ATYPICAL LYMPHOCYTES  PATHOLOGIST SMEAR REVIEW     Status: Normal   Collection Time   05/02/12  5:05 AM      Component Value Range Comment   Tech Review MARKED ABSOLUTE NEUTROPENIA     CBC WITH DIFFERENTIAL     Status: Abnormal   Collection Time   05/03/12  9:18 AM      Component Value Range Comment   WBC 1.8 (*) 4.0 - 10.5 K/uL    RBC 4.84  4.22 - 5.81 MIL/uL    Hemoglobin 14.5  13.0 - 17.0 g/dL    HCT 40.9  81.1 - 91.4 %    MCV 83.3  78.0 - 100.0 fL    MCH 30.0  26.0 - 34.0 pg    MCHC 36.0  30.0 - 36.0 g/dL    RDW 78.2  95.6 - 21.3 %    Platelets 112 (*) 150 - 400 K/uL    Neutrophils Relative 27 (*) 43 - 77 %    Lymphocytes Relative 63 (*) 12 - 46 %    Monocytes Relative 7  3 - 12 %    Eosinophils Relative 0  0 - 5 %    Basophils Relative 3 (*) 0 - 1 %    Neutro Abs 0.5 (*) 1.7 - 7.7 K/uL    Lymphs Abs 1.1  0.7 - 4.0 K/uL    Monocytes Absolute 0.1  0.1 - 1.0 K/uL    Eosinophils Absolute 0.0  0.0 - 0.7 K/uL    Basophils Absolute 0.1  0.0 - 0.1 K/uL    WBC Morphology ATYPICAL LYMPHOCYTES   INCREASED BANDS (>20% BANDS)   Ct Abdomen Pelvis W Contrast  05/02/2012  *RADIOLOGY REPORT*  Clinical Data: 4-day history of low grade fever with left scrotal pain and swelling.  CT ABDOMEN AND PELVIS WITH CONTRAST  Technique:  Multidetector CT imaging of the abdomen and pelvis was  performed following the standard protocol during bolus administration of intravenous contrast.  Contrast:  100 ml Omnipaque-300  Comparison: Scrotal ultrasound dated 05/01/2012  Findings: The patient has prominent bilateral hydroceles to calcifications in the left side of the scrotum consistent with coronal coronals, benign.  There is slight thickening of the left side of the scrotum with focal thickening of the scrotum adjacent to the scrotal pearls.  There is no evidence of inflammation in the perineum. There are multiple small lymph nodes in the inguinal regions bilaterally with fatty hila consistent with benign reactive nodes.  The patient does have multiple enlarged lymph nodes in the periportal area extending into the hilum of the liver.  This is not at this specific but can be seen with hepatitis.  Liver parenchyma appears normal.  No biliary ductal dilatation.  Gallbladder is normal.  The spleen, pancreas, adrenal glands, and kidneys are normal. There is a tiny amount of ascites in the pelvis and in the left pericolic gutter, nonspecific.  The bowel appears normal including the terminal ileum and appendix. No periaortic adenopathy.  No osseous abnormalities.  Lung bases are clear.  IMPRESSION:  1.  Periportal and hilar adenopathy at the hilum of the liver, nonspecific but this can be seen with hepatitis. 2.  Small amount of nonspecific ascites. 3.  Bilateral hydroceles with focal areas of thickening of the scrotum.  I suspect this is related to remote trauma.  No evidence suggestive of active inflammation of the perineum.  Original Report Authenticated By: Gwynn Burly, M.D.    Review of Systems  Constitutional: Positive for fever.  Respiratory: Negative for cough and shortness of breath.   Cardiovascular: Negative for chest pain.  Gastrointestinal: Negative for nausea and vomiting.  Neurological: Positive for headaches. Negative for dizziness.    Blood pressure 114/71, pulse 69, temperature  99 F (37.2 C), temperature source Oral, resp. rate 20, height 5\' 7"  (1.702  m), weight 154 lb 12.8 oz (70.217 kg), SpO2 100.00%. Physical Exam  Constitutional: He is oriented to person, place, and time.  Cardiovascular: Normal rate, regular rhythm and normal heart sounds.   No murmur heard. Respiratory: Effort normal and breath sounds normal.  GI: Soft. Bowel sounds are normal. There is no tenderness.  Musculoskeletal: Normal range of motion.  Neurological: He is alert and oriented to person, place, and time. No cranial nerve deficit.  Skin: Skin is warm and dry.  Psychiatric: He has a normal mood and affect. His behavior is normal. Judgment and thought content normal.     Assessment/Plan Thrombocytopenia FUO; leukocytosis Scheduled for BM Bx in IR in am Pt aware of procedure benefits and risks and agreeable to proceed. Consent in chart  Lon Klippel A 05/03/2012, 11:57 AM

## 2012-05-04 ENCOUNTER — Inpatient Hospital Stay (HOSPITAL_COMMUNITY): Payer: Self-pay

## 2012-05-04 LAB — COMPREHENSIVE METABOLIC PANEL
ALT: 93 U/L — ABNORMAL HIGH (ref 0–53)
Albumin: 3.1 g/dL — ABNORMAL LOW (ref 3.5–5.2)
Alkaline Phosphatase: 108 U/L (ref 39–117)
BUN: 5 mg/dL — ABNORMAL LOW (ref 6–23)
Chloride: 102 mEq/L (ref 96–112)
GFR calc Af Amer: >90 mL/min (ref >90)
Glucose, Bld: 92 mg/dL (ref 70–99)
Potassium: 3.8 mEq/L (ref 3.5–5.1)
Sodium: 137 mEq/L (ref 135–145)
Total Bilirubin: 0.8 mg/dL (ref 0.3–1.2)

## 2012-05-04 LAB — RHEUMATOID FACTOR: Rhuematoid fact SerPl-aCnc: <10 IU/mL (ref <=14)

## 2012-05-04 LAB — CBC WITH DIFFERENTIAL/PLATELET
Eosinophils Relative: 0 % (ref 0–5)
HCT: 38.3 % — ABNORMAL LOW (ref 39.0–52.0)
Hemoglobin: 13.7 g/dL (ref 13.0–17.0)
Lymphs Abs: 1.7 10*3/uL (ref 0.7–4.0)
MCV: 83.3 fL (ref 78.0–100.0)
Monocytes Relative: 4 % (ref 3–12)
Neutro Abs: 0.5 10*3/uL — ABNORMAL LOW (ref 1.7–7.7)
RBC: 4.6 MIL/uL (ref 4.22–5.81)
WBC: 2.3 10*3/uL — ABNORMAL LOW (ref 4.0–10.5)

## 2012-05-04 MED ORDER — MIDAZOLAM HCL 5 MG/5ML IJ SOLN
INTRAMUSCULAR | Status: AC | PRN
  Administered 2012-05-04 (×2): 1 mg via INTRAVENOUS

## 2012-05-04 MED ORDER — FENTANYL CITRATE 0.05 MG/ML IJ SOLN
INTRAMUSCULAR | Status: AC
  Filled 2012-05-04: qty 6

## 2012-05-04 MED ORDER — MIDAZOLAM HCL 2 MG/2ML IJ SOLN
INTRAMUSCULAR | Status: AC
  Filled 2012-05-04: qty 6

## 2012-05-04 MED ORDER — FENTANYL CITRATE 0.05 MG/ML IJ SOLN
INTRAMUSCULAR | Status: AC | PRN
  Administered 2012-05-04 (×2): 50 ug via INTRAVENOUS

## 2012-05-04 NOTE — Procedures (Signed)
CT bone marrow aspiration and core bx R ilium No complication No blood loss. See complete dictation in [facility].

## 2012-05-04 NOTE — Progress Notes (Addendum)
Subjective: No acute events overnight, afebrile past 24 hours  Objective: Vital signs in last 24 hours: Filed Vitals:   05/03/12 1355 05/03/12 2205 05/04/12 0406 05/04/12 0450  BP: 105/56 95/60  108/69  Pulse: 75 61  63  Temp: 97.6 F (36.4 C) 98.4 F (36.9 C) 98.5 F (36.9 C) 98.8 F (37.1 C)  TempSrc: Oral Oral Oral Oral  Resp: 18 20  16   Height:      Weight:      SpO2: 99% 99%  97%   Weight change:   Intake/Output Summary (Last 24 hours) at 05/04/12 0910 Last data filed at 05/04/12 0410  Gross per 24 hour  Intake 3043.33 ml  Output   1915 ml  Net 1128.33 ml   Physical Exam: General: Well-developed, well-nourished, in no acute distress; Head: Normocephalic, atraumatic. Lungs: Normal respiratory effort. Clear to auscultation bilaterally from apices to bases without crackles or wheezes appreciated. Heart: normal rate, regular rhythm, normal S1 and S2, no gallop, murmur, or rubs appreciated. Abdomen: BS normoactive. Soft, Nondistended, non-tender. No masses or organomegaly appreciated.  Neurologic: grossly non-focal, alert and oriented x3, appropriate and cooperative throughout examination.  Skin: no rashes noted   Lab Results: Basic Metabolic Panel:  Lab 05/02/12 1610 05/01/12 1634 05/01/12 0804  NA 134* -- 133*  K 3.8 -- 4.0  CL 100 -- 98  CO2 24 -- 26  GLUCOSE 90 -- 103*  BUN 6 -- 8  CREATININE 1.12 1.07 --  CALCIUM 8.7 -- 9.2  MG -- -- --  PHOS -- -- --   Liver Function Tests:  Lab 05/02/12 0505 05/01/12 1635  AST 85* 64*  ALT 60* 51  ALKPHOS 87 79  BILITOT 0.6 0.6  PROT 6.7 7.1  ALBUMIN 3.2* 3.3*   CBC:  Lab 05/04/12 0828 05/03/12 0918  WBC 2.3* 1.8*  NEUTROABS PENDING 0.5*  HGB 13.7 14.5  HCT 38.3* 40.3  MCV 83.3 83.3  PLT 101* 112*   Urinalysis:  Lab 05/01/12 0835  COLORURINE YELLOW  LABSPEC 1.015  PHURINE 8.0  GLUCOSEU NEGATIVE  HGBUR NEGATIVE  BILIRUBINUR NEGATIVE  KETONESUR NEGATIVE  PROTEINUR NEGATIVE  UROBILINOGEN 1.0    NITRITE NEGATIVE  LEUKOCYTESUR NEGATIVE   Erythrocyte Sedimentation Rate     Component Value Date/Time   ESRSEDRATE 5 05/03/2012 1250    05/03/12 Prothrombin Time/INR PT 16.3 sec  Lab 05/03/12 1250  INR 1.29   Cardiac Panel (last 3 results)  Basename 05/03/12 1250  CKTOTAL 92    Micro Results: Recent Results (from the past 240 hour(s))  CULTURE, BLOOD (ROUTINE X 2)     Status: Normal (Preliminary result)   Collection Time   05/01/12 10:00 AM      Component Value Range Status Comment   Specimen Description BLOOD LEFT ARM   Final    Special Requests BOTTLES DRAWN AEROBIC AND ANAEROBIC 10CC   Final    Culture  Setup Time [id number]   Final    Culture     Final    Value:        BLOOD CULTURE RECEIVED NO GROWTH TO DATE CULTURE WILL BE HELD FOR 5 DAYS BEFORE ISSUING A FINAL NEGATIVE REPORT   Report Status PENDING   Incomplete   CULTURE, BLOOD (ROUTINE X 2)     Status: Normal (Preliminary result)   Collection Time   05/01/12 10:05 AM      Component Value Range Status Comment   Specimen Description BLOOD LEFT ARM   Final  Special Requests BOTTLES DRAWN AEROBIC AND ANAEROBIC 10CC   Final    Culture  Setup Time [id number]   Final    Culture     Final    Value:        BLOOD CULTURE RECEIVED NO GROWTH TO DATE CULTURE WILL BE HELD FOR 5 DAYS BEFORE ISSUING A FINAL NEGATIVE REPORT   Report Status PENDING   Incomplete   CULTURE, BLOOD (ROUTINE X 2)     Status: Normal (Preliminary result)   Collection Time   05/03/12 12:33 PM      Component Value Range Status Comment   Specimen Description BLOOD FOREARM RIGHT   Final    Special Requests BOTTLES DRAWN AEROBIC ONLY 10CC   Final    Culture  Setup Time [id number]   Final    Culture     Final    Value:        BLOOD CULTURE RECEIVED NO GROWTH TO DATE CULTURE WILL BE HELD FOR 5 DAYS BEFORE ISSUING A FINAL NEGATIVE REPORT   Report Status PENDING   Incomplete   CULTURE, BLOOD (ROUTINE X 2)     Status: Normal (Preliminary result)    Collection Time   05/03/12 12:46 PM      Component Value Range Status Comment   Specimen Description BLOOD HAND LEFT   Final    Special Requests BOTTLES DRAWN AEROBIC ONLY 8CC   Final    Culture  Setup Time [id number]   Final    Culture     Final    Value:        BLOOD CULTURE RECEIVED NO GROWTH TO DATE CULTURE WILL BE HELD FOR 5 DAYS BEFORE ISSUING A FINAL NEGATIVE REPORT   Report Status PENDING   Incomplete    Studies/Results: Ct Chest W Contrast  05/03/2012  *RADIOLOGY REPORT*  Clinical Data: Fever unknown origin.  Shortness of breath.  CT CHEST WITH CONTRAST  Technique:  Multidetector CT imaging of the chest was performed following the standard protocol during bolus administration of intravenous contrast.  Contrast: 80mL OMNIPAQUE IOHEXOL 300 MG/ML  SOLN  Comparison: None.  Findings: No evidence of mediastinal or hilar masses.  No lymphadenopathy is seen elsewhere within the thorax.  No evidence of pleural or pericardial effusion.  No evidence of pulmonary infiltrate or mass.  No central endobronchial lesion identified. No suspicious bone lesions are identified.  Visualized upper abdominal structures are unremarkable.  IMPRESSION: Negative.  No active disease or other significant abnormality.  Original Report Authenticated By: Danae Orleans, M.D.   Ct Abdomen Pelvis W Contrast  05/02/2012  *RADIOLOGY REPORT*  Clinical Data: 4-day history of low grade fever with left scrotal pain and swelling.  CT ABDOMEN AND PELVIS WITH CONTRAST  Technique:  Multidetector CT imaging of the abdomen and pelvis was performed following the standard protocol during bolus administration of intravenous contrast.  Contrast:  100 ml Omnipaque-300  Comparison: Scrotal ultrasound dated 05/01/2012  Findings: The patient has prominent bilateral hydroceles to calcifications in the left side of the scrotum consistent with coronal coronals, benign.  There is slight thickening of the left side of the scrotum with focal thickening  of the scrotum adjacent to the scrotal pearls.  There is no evidence of inflammation in the perineum. There are multiple small lymph nodes in the inguinal regions bilaterally with fatty hila consistent with benign reactive nodes.  The patient does have multiple enlarged lymph nodes in the periportal area extending into the hilum of the  liver.  This is not at this specific but can be seen with hepatitis.  Liver parenchyma appears normal.  No biliary ductal dilatation.  Gallbladder is normal.  The spleen, pancreas, adrenal glands, and kidneys are normal. There is a tiny amount of ascites in the pelvis and in the left pericolic gutter, nonspecific.  The bowel appears normal including the terminal ileum and appendix. No periaortic adenopathy.  No osseous abnormalities.  Lung bases are clear.  IMPRESSION:  1.  Periportal and hilar adenopathy at the hilum of the liver, nonspecific but this can be seen with hepatitis. 2.  Small amount of nonspecific ascites. 3.  Bilateral hydroceles with focal areas of thickening of the scrotum.  I suspect this is related to remote trauma.  No evidence suggestive of active inflammation of the perineum.  Original Report Authenticated By: Gwynn Burly, M.D.   Medications: I have reviewed the patient's current medications. Scheduled Meds:    . doxycycline  100 mg Oral Q12H  . enoxaparin  40 mg Subcutaneous Q24H  . ondansetron (ZOFRAN) IV  4 mg Intravenous Once  . sodium chloride  3 mL Intravenous Q12H  . DISCONTD: tuberculin  5 Units Intradermal Once   Continuous Infusions:    . sodium chloride 100 mL/hr at 05/03/12 2351   PRN Meds:.sodium chloride, acetaminophen, acetaminophen, HYDROcodone-acetaminophen, iohexol, ondansetron (ZOFRAN) IV, ondansetron, sodium chloride  Assessment/Plan: HD#3 for 41 yo male without significant past medical history admitted with fever, neutropenia, thrombocytopenia and generalized myalgias and arthralgias.   1. Cytopenia with Neutropenic  fever:  Leukocytosis improving to level above that on admission. ANC stable at 500 today (1599-->800-->500--> 500). Continued thrombocytopenia, on doxycyline Day #3 , Ehrlichiosis panel and [facility] Spotted Fever Antibody neg, BCx neg thus far. Peripheral smear with atypical lymphocytes and >20% bands without schistocytes, s/p one dose Vancomycin and Levaquin in ED.  CT of chest with abnormalities. Will continue to treat as possible tick exposure ie Ehrlichiosis with delayed response to doxycycline given clinical presentation (?tick exposure, fever, headache, malaise, muscle pain, nausea/vomiting, leukopenia and thrombocytopenia).  In addition spoke with pathologist who reviewed blood smear as to if morulae (microcolonies of ehrlichiae) could be evaluated given that notation of toxic granulation was noted.  None evident per Dr. Cardell Peach (Pathologist) but could send to [facility] for Ehrlich DNA PCR (test [phone] EDTA tube refrigerated). -bone marrow biopsy with marrow sent for AFB, fungal, bacterial cultures as well as pathological examination scheduled for today. -cont doxycycline 100 mg bid  -saline lock IVF -pending FUO work-up labs CRP Rheum Factor   ANA  SPEP  ACE level  ferritin,  Quantiferon gold EBV antibody panel  CMV IgG and IgM   Parvovirus IgG and IgM and Parvovirus PCR on blood   Arbovirus panel from serum (including [place] Virus)   Lab 05/04/12 0828 05/03/12 0918 05/02/12 0505  HGB 13.7 14.5 14.4  HCT 38.3* 40.3 40.8  WBC 2.3* 1.8* 1.6*  PLT 101* 112* 126*   CBC    Component Value Date/Time   WBC 2.3* 05/04/2012 0828   RBC 4.60 05/04/2012 0828   HGB 13.7 05/04/2012 0828   HCT 38.3* 05/04/2012 0828   PLT 101* 05/04/2012 0828   MCV 83.3 05/04/2012 0828   MCH 29.8 05/04/2012 0828   MCHC 35.8 05/04/2012 0828   RDW 13.0 05/04/2012 0828   LYMPHSABS 1.7 05/04/2012 0828   MONOABS 0.1 05/04/2012 0828   EOSABS 0.0 05/04/2012 0828   BASOSABS 0.0 05/04/2012 0454  NEUTROABS         0.5      05/04/2012   2. Elevated liver enzymes: increasing since admission, Hepatitis C Virus Antibody reactive with LHD elevated to 479 (94-250 nl), elevated Pro-time of 16.3 ( 11.6-15.2 nl) and with CT of abdomen demonstrating periportal and hilar adenopathy of the liver that can be consistent with hepatitis. Will order further hep workup.  Elevated hepatic enzymes also c/w possible Erhlichiosis infection. -check HCV RNA, qual -check Hep C genotype -trend liver enzymes   Lab 05/04/12 0828 05/03/12 1250 05/02/12 0505 05/01/12 1635  AST 139* -- 85* 64*  ALT 93* -- 60* 51  ALKPHOS 108 -- 87 79  BILITOT 0.8 -- 0.6 0.6  PROT 6.5 -- 6.7 7.1  ALBUMIN 3.1* -- 3.2* 3.3*  INR -- 1.29 -- --    3. 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 Scrotal wall mass: likely benign secondary to trauma, CT of pelvis w/o abscess findings -appreciate Urology recommendations -f/u Urology as outpt if needed  5. Disposition: f/u scheduled with in Internal Medicine Clinic with Dr. Clyde Lundborg July 3rd at 9:15am   LOS: 3 days   Kristie Cowman 05/04/2012, 9:10 AM  Internal Medicine Teaching Service Attending Note Date: 05/04/2012  Patient name: Bruce [name]  Medical record number: [id number]  Date of birth: Feb 19, 1971    This patient has been seen and discussed with the house staff. Please see their note for complete details. I concur with their findings with the following additions/corrections:  Patient continues to improve clinically. He appears to been afebrile the last 24 hours late states he has had some subjective fevers. His overall leukopenia is improved although his neutropenia remains the same and with an ANC of 500. Thrombocytopenia stable as well. For now found as a hepatitis C positive antibody as well.   He is going for a bone  marrow biopsy today for fungal AFB and bacteria cultures well as pathological examination.  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 Will continue doxy for ten day course.  I would highly recommend convalescent titers in 2 weeks. We will also check hep c rna given hep C ab positivity and Hep c genotype.  Paulette Blanch Dam 05/04/2012, 11:30 AM

## 2012-05-05 DIAGNOSIS — A938 Other specified arthropod-borne viral fevers: Secondary | ICD-10-CM | POA: Diagnosis present

## 2012-05-05 DIAGNOSIS — B356 Tinea cruris: Secondary | ICD-10-CM | POA: Diagnosis present

## 2012-05-05 LAB — COMPREHENSIVE METABOLIC PANEL
Alkaline Phosphatase: 115 U/L (ref 39–117)
BUN: 6 mg/dL (ref 6–23)
CO2: 25 mEq/L (ref 19–32)
Chloride: 102 mEq/L (ref 96–112)
Creatinine, Ser: 0.89 mg/dL (ref 0.50–1.35)
GFR calc Af Amer: >90 mL/min (ref >90)
GFR calc non Af Amer: >90 mL/min (ref >90)
Glucose, Bld: 92 mg/dL (ref 70–99)
Potassium: 3.7 mEq/L (ref 3.5–5.1)
Total Bilirubin: 0.8 mg/dL (ref 0.3–1.2)

## 2012-05-05 LAB — CBC WITH DIFFERENTIAL/PLATELET
Basophils Absolute: 0.1 10*3/uL (ref 0.0–0.1)
Basophils Relative: 2 % — ABNORMAL HIGH (ref 0–1)
HCT: 37.4 % — ABNORMAL LOW (ref 39.0–52.0)
Hemoglobin: 13.2 g/dL (ref 13.0–17.0)
Lymphs Abs: 2.9 10*3/uL (ref 0.7–4.0)
MCV: 82.6 fL (ref 78.0–100.0)
Monocytes Relative: 10 % (ref 3–12)
Neutro Abs: 0.5 10*3/uL — ABNORMAL LOW (ref 1.7–7.7)
RDW: 13 % (ref 11.5–15.5)
WBC: 3.9 10*3/uL — ABNORMAL LOW (ref 4.0–10.5)

## 2012-05-05 LAB — ANGIOTENSIN CONVERTING ENZYME: Angiotensin-Converting Enzyme: 66 U/L — ABNORMAL HIGH (ref 8–52)

## 2012-05-05 MED ORDER — CLOTRIMAZOLE 1 % EX CREA
TOPICAL_CREAM | Freq: Two times a day (BID) | CUTANEOUS | Status: DC
  Administered 2012-05-05 – 2012-05-06 (×3): via TOPICAL
  Filled 2012-05-05: qty 15

## 2012-05-05 MED ORDER — CLOTRIMAZOLE 1 % EX SOLN
Freq: Two times a day (BID) | CUTANEOUS | Status: DC
  Filled 2012-05-05: qty 10

## 2012-05-05 NOTE — Discharge Summary (Signed)
Internal Medicine Teaching [facility] Discharge Note  Name: Bruce [name] MRN: [id number] DOB: [birth year] 41 y.o.  Date of Admission: 05/01/2012  7:26 AM Date of Discharge: 05/06/2012 Attending Physician: Randall Hiss, MD  Discharge Diagnosis: Principal Problem:  *Fever and neutropenia Active Problems:  Tick borne fever, likely  Spermatocele of epididymis  Bilateral varicoceles  Bilateral hydrocele  Tinea cruris, likely  Myalgia  Arthralgia  Headache   Discharge Medications: Medication List  As of 05/06/2012 10:59 AM   TAKE these medications         clotrimazole 1 % cream   Commonly known as: LOTRIMIN   Apply topically 2 (two) times daily. For 3 days      diphenhydrAMINE 25 mg capsule   Commonly known as: BENADRYL   Take 25 mg by mouth every 6 (six) hours as needed. For allergies      doxycycline 100 MG tablet   Commonly known as: VIBRA-TABS   Take 1 tablet (100 mg total) by mouth every 12 (twelve) hours.      HYDROcodone-acetaminophen 5-325 MG per tablet   Commonly known as: NORCO   Take 1 tablet by mouth every 4 (four) hours as needed.      ibuprofen 200 MG tablet   Commonly known as: ADVIL,MOTRIN   Take 200 mg by mouth every 6 (six) hours as needed. For pain      multivitamin with minerals Tabs   Take 1 tablet by mouth daily.            Disposition and follow-up:   [name] was discharged from [facility] in Stable condition.  At the hospital follow up visit please address bone marrow biopsy results and pending lab tests at time of disharge including hepatitis C qualitative. Please ensure that he has been contacted for a follow-up appointment with Infectious Diseases for Ehrlichiosis and RMSF convalescent titers.  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 Please check repeat CBC with differential for resolution of leukopenia and  neutropenia in addition to a CMET to ensure that his liver enzymes are trending towards normal.  Follow-up Appointments: Follow-up Information    Follow up with Lorretta Harp, MD on 05/09/2012. (at 9:15am )    Contact information:   [facility] [address] Ground Floor [place] [address] [phone]       Follow up with [facility]. Schedule an appointment as soon as possible for a visit in 1 week. (to get blood work for tick infection convalescent titers)    Contact information:   [facility] [address] [place] [address] [phone]        Discharge Orders    Future Appointments: Provider: Department: Dept Phone: Center:   05/09/2012 9:15 AM Lorretta Harp, MD [facility] [id number] [facility]     Future Orders Please Complete By Expires   Call MD for:  temperature >100.4      Call MD for:  persistant nausea and vomiting         Consultations: IR Radiology Urology Service (Dr. Patsi Sears of [facility])    Procedures Performed:  Dg Chest 2 View  05/01/2012  *RADIOLOGY REPORT*  Clinical Data: Fever  CHEST - 2 VIEW  Comparison: 03/28/2010  Findings: Lungs are essentially clear.  No pleural effusion or pneumothorax.  Cardiomediastinal silhouette is within normal  limits.  Visualized osseous structures are within normal limits.  IMPRESSION: Normal chest radiographs.  Original Report Authenticated By: Charline Bills, M.D.   Ct Chest W Contrast  05/03/2012  *RADIOLOGY REPORT*  Clinical Data: Fever unknown origin.  Shortness of breath.  CT CHEST WITH CONTRAST  Technique:  Multidetector CT imaging of the chest was performed following the standard protocol during bolus administration of intravenous contrast.  Contrast: 80mL OMNIPAQUE IOHEXOL 300 MG/ML  SOLN  Comparison: None.  Findings: No evidence of mediastinal or hilar masses.  No lymphadenopathy is seen elsewhere within the  thorax.  No evidence of pleural or pericardial effusion.  No evidence of pulmonary infiltrate or mass.  No central endobronchial lesion identified. No suspicious bone lesions are identified.  Visualized upper abdominal structures are unremarkable.  IMPRESSION: Negative.  No active disease or other significant abnormality.  Original Report Authenticated By: Danae Orleans, M.D.   US Scrotum  05/01/2012  *RADIOLOGY REPORT*  Clinical Data:  1 cm firm tender scrotal mass, bilateral scrotal swelling since basketball injury 1 month ago  SCROTAL ULTRASOUND DOPPLER ULTRASOUND OF THE TESTICLES  Technique: Complete ultrasound examination of the testicles, epididymis, and other scrotal structures was performed.  Color and spectral Doppler ultrasound were also utilized to evaluate blood flow to the testicles.  Comparison:  None  Findings:  Right testis:  Normal in size, measuring 3.7 x 2.3 x 2.2 cm. Limited microlithiasis.  Left testis:  Normal in size, measuring 3.2 x 2.0 x 2.9 cm. Limited microlithiasis.  Right epididymis:  2.4 x 2.2 x 2.2 cm epididymal head cyst/spermatocele.  Left epididymis:  Normal in size and appearance, noting small less than 5 mm epididymal head cysts.  Hydrocele:  Moderate complex hydroceles bilaterally.  Suspected small left scrotal pearls.  Varicocele:  Present bilaterally.  Pulsed Doppler interrogation of both testes demonstrates low resistance flow bilaterally.  Additional comments: 1.5 x 1.0 x 1.5 cm heterogeneous solid lesion with well-circumscribed margins and coarse central calcification in the left scrotal wall.  IMPRESSION: 1.5 cm well-circumscribed solid lesion in the left scrotal wall. While indeterminate, the sonographic appearance and location favors a benign lesion, possibly the sequela of prior trauma.  If this reflects a new palpable abnormality, consider biopsy or follow-up ultrasound in 6 months.  2.4 cm right epididymal head cyst/spermatocele.  Bilateral complex hydroceles.   Bilateral varicoceles.  Left scrotal pearls.  Original Report Authenticated By: Charline Bills, M.D.   Ct Abdomen Pelvis W Contrast  05/02/2012  *RADIOLOGY REPORT*  Clinical Data: 4-day history of low grade fever with left scrotal pain and swelling.  CT ABDOMEN AND PELVIS WITH CONTRAST  Technique:  Multidetector CT imaging of the abdomen and pelvis was performed following the standard protocol during bolus administration of intravenous contrast.  Contrast:  100 ml Omnipaque-300  Comparison: Scrotal ultrasound dated 05/01/2012  Findings: The patient has prominent bilateral hydroceles to calcifications in the left side of the scrotum consistent with coronal coronals, benign.  There is slight thickening of the left side of the scrotum with focal thickening of the scrotum adjacent to the scrotal pearls.  There is no evidence of inflammation in the perineum. There are multiple small lymph nodes in the inguinal regions bilaterally with fatty hila consistent with benign reactive nodes.  The patient does have multiple enlarged lymph nodes in the periportal area extending into the hilum of the liver.  This is not at this specific but can be seen with hepatitis.  Liver parenchyma appears normal.  No  biliary ductal dilatation.  Gallbladder is normal.  The spleen, pancreas, adrenal glands, and kidneys are normal. There is a tiny amount of ascites in the pelvis and in the left pericolic gutter, nonspecific.  The bowel appears normal including the terminal ileum and appendix. No periaortic adenopathy.  No osseous abnormalities.  Lung bases are clear.  IMPRESSION:  1.  Periportal and hilar adenopathy at the hilum of the liver, nonspecific but this can be seen with hepatitis. 2.  Small amount of nonspecific ascites. 3.  Bilateral hydroceles with focal areas of thickening of the scrotum.  I suspect this is related to remote trauma.  No evidence suggestive of active inflammation of the perineum.  Original Report Authenticated  By: Gwynn Burly, M.D.   [place] Art/ven Flow Abd Pelv Doppler  05/01/2012  *RADIOLOGY REPORT*  Clinical Data:  1 cm firm tender scrotal mass, bilateral scrotal swelling since basketball injury 1 month ago  SCROTAL ULTRASOUND DOPPLER ULTRASOUND OF THE TESTICLES  Technique: Complete ultrasound examination of the testicles, epididymis, and other scrotal structures was performed.  Color and spectral Doppler ultrasound were also utilized to evaluate blood flow to the testicles.  Comparison:  None  Findings:  Right testis:  Normal in size, measuring 3.7 x 2.3 x 2.2 cm. Limited microlithiasis.  Left testis:  Normal in size, measuring 3.2 x 2.0 x 2.9 cm. Limited microlithiasis.  Right epididymis:  2.4 x 2.2 x 2.2 cm epididymal head cyst/spermatocele.  Left epididymis:  Normal in size and appearance, noting small less than 5 mm epididymal head cysts.  Hydrocele:  Moderate complex hydroceles bilaterally.  Suspected small left scrotal pearls.  Varicocele:  Present bilaterally.  Pulsed Doppler interrogation of both testes demonstrates low resistance flow bilaterally.  Additional comments: 1.5 x 1.0 x 1.5 cm heterogeneous solid lesion with well-circumscribed margins and coarse central calcification in the left scrotal wall.  IMPRESSION: 1.5 cm well-circumscribed solid lesion in the left scrotal wall. While indeterminate, the sonographic appearance and location favors a benign lesion, possibly the sequela of prior trauma.  If this reflects a new palpable abnormality, consider biopsy or follow-up ultrasound in 6 months.  2.4 cm right epididymal head cyst/spermatocele.  Bilateral complex hydroceles.  Bilateral varicoceles.  Left scrotal pearls.  Original Report Authenticated By: Charline Bills, M.D.   Ct Biopsy  05/04/2012  *RADIOLOGY REPORT*  Clinical data: Neutropenic fever of unknown origin  CT GUIDED DEEP ILIAC BONE ASPIRATION AND CORE BIOPSY:  Technique: Patient was placed supine on the CT gantry and limited axial  scans through the pelvis were obtained. Appropriate skin entry site was identified. Skin site was marked, prepped with Betadine,   draped in usual sterile fashion, and infiltrated locally with 1% lidocaine.  Intravenous Fentanyl and Versed were administered as conscious sedation during continuous cardiorespiratory monitoring by the radiology RN, with a total moderate sedation time of 10 minutes.  Under CT fluoroscopic guidance an 11-gauge Cook trocar bone needle was advanced into the right iliac bone just lateral to the sacroiliac joint. Once needle tip position was confirmed,   core and aspiration samples were obtained. The final sample was obtained using the guiding needle itself, which was then removed. Postprocedure scans show no hematoma or fracture. Patient tolerated procedure well, with no immediate complication.  IMPRESSION:  1. Technically successful CT guided right iliac bone core and aspiration biopsy.  Original Report Authenticated By: Osa Craver, M.D.   Admission HPI: History of Present Illness: Mr. Zuercher is a 41 yo without  significant past medical history who presents to [facility] ED with complaints of fever associated with general malaise over the past 3 days. Patient states that he was in his normal state of health without known sick contacts and gradually began to feel weak, feverish and mildly nauseous without vomiting before today in the ED. He states that he feels mildly short of breath on exertion but denies chest pain, cough, abdominal pain, change in bowel habits including blood or mucus, penile discharge or pain, or sick contacts He states that he does occasional work for a Editor, commissioning and has not noticed a tick bit or rash. Additionally, he states that well over a month ago he suffered a trauma to his scrotum while playing basketball. He subsequently developed scrotal swelling and bruising that has since resolved. Of note, he states that he has had these scrotal swellings  since the age of 88 which were evaluated at that time and deemed benign. Furthermore, he states that he got an HIV at the [facility] that was negative. He endorses persistent scrotal tenderness especially over the swellings that is aggravated when lifting "boxes" or if his scrotum moves a lot while wearing boxer underwear.  ED course: Found to be neutropenic and febrile with Tmx 102.1, received I Liter NS IVF, Vancomycin 1 g x1, Levofloxacin 750 mg x1 and Zofran  Admission Physical Exam:  Blood pressure 104/59, pulse 61, temperature 98.5 F (36.9 C), temperature source Oral, resp. rate 16, SpO2 100.00%. General: Well-developed, well-nourished, in no acute distress; lying flat on stretcher, girlfriend at bedside Head: Normocephalic, atraumatic. Eyes: PERRLA, EOMI, No signs of anemia or jaundice. Throat: Oropharynx nonerythematous, no exudate appreciated.  Neck: supple, no masses, no cervical LAD appreciated Lungs: Normal respiratory effort. Clear to auscultation bilaterally from apices to bases without crackles or wheezes appreciated. Heart: normal rate, regular rhythm, normal S1 and S2, no gallop, murmur, or rubs appreciated. Abdomen: BS normoactive. Soft, Nondistended, non-tender. No masses or organomegaly appreciated.  GU: epididymis non-tender to palpation, 3 palpable swellings within scrotum which reduce lying down, mildly tender, normal testicular lie, normal cremasteric reflex Extremities: no inguinal LAD appreciated, femoral pulses intact, No pretibial edema, distal pulses intact Neurologic: grossly non-focal, alert and oriented x3, appropriate and cooperative throughout examination.  Skin: no rashes noted, mildly diaphoretic to the touch    Hospital Course by problem list:  1. Cytopenia with Neutropenic fever: Mr. Kesinger  experienced fever Tmax 103.1, leukopenia with atypical lymphocytes (>20% bands with toxic granulation on peripheral smear (no schistocytes nor Ehrlichiae  evident), neutropenia with ANC of 500, thrombocytopenia with platelet counts as low as 101,000 and generalized myalgias, arthralgias, nausea and vomiting in setting of likely insect bites.  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 He further states that he pulled what he thought was a scab off of his scalp but thinks it could have been a tick. He also reported a recent event whereby he was sitting on the couch of a friend and something bit him on his neck.  Athough he reports these incidences were fairly recent, he cannot remember exact dates. Given this history and the clinical picture of warm weather, fever, headache, malaise, muscle pain, nausea/vomiting, leukopenia, thrombocytopenia and elevated liver enzymes, the likelihood of tick borne illness was at the top of our differential diagnosis. Erhlichiosis or [facility] Spotted Fever is the likely vector. Mr. Jun  was treated empirically with Doxycycline 100 mg bid, IV fluids,Tylenol and Vicodin for 2 days with continued incease in fevers.  He then had extensive work-up for fever of unknown origin which included:  --Bone marrow biopsy Pending --CRP elevated  --ferritin elevated  --Rheum Factor wnl  --ANA wnl  --EBV antibody panel neg  --SPEP pending  --ACE level pending  --Quantiferon gold pending  --CMV IgG and IgM pending  --Parvovirus IgG and IgM and Parvovirus PCR on blood pending --Arbovirus panel from serum (including [place] Virus) pending   With continued doxycycline treatment and supportive management, Mr. Bernards showed progressive improvement in his fevers, nausea, myalgias, and subjective well-being.  By hospital day #3, he began to show improment in leukopenia. Thrombocytopenia started to trend towards normal by HD#5. He remained neutropenic with Absolute Neutrophil Count of 500 which was stable for several days.  By day of discharge he felt  close to his baseline and was discharged on doxycycline for 10 day total course.    He is to follow-up as a new patient in the [facility] on May 09, 2012.  In addition he will follow up with Dr.Van Dam for convalescent titers of Ehlichiosis and RMSF.    Lab 05/06/12 0645 05/05/12 0732 05/04/12 0828 05/03/12 0918 05/02/12 0505  PLT 179 137* 101* 112* 126*    Lab 05/06/12 0645 05/05/12 0732 05/04/12 0828 05/03/12 0918 05/02/12 0505  WBC 3.7* 3.9* 2.3* 1.8* 1.6*     NEUTROABS  Admission 1500-->800-->500-->500-->500-->500 Discharge  CBC    Component Value Date/Time   WBC 3.7* 05/06/2012 0645   RBC 4.53 05/06/2012 0645   HGB 13.2 05/06/2012 0645   HCT 37.7* 05/06/2012 0645   PLT 179 05/06/2012 0645   MCV 83.2 05/06/2012 0645   MCH 29.1 05/06/2012 0645   MCHC 35.0 05/06/2012 0645   RDW 13.2 05/06/2012 0645   LYMPHSABS 2.7 05/06/2012 0645   MONOABS 0.4 05/06/2012 0645   EOSABS 0.1 05/06/2012 0645   BASOSABS 0.0 05/06/2012 0645        NEUTROABS       0.5    2. Elevated liver enzymes: Mr. Susman was admitted with AST close to his baseline and normal ALT.  By HD#4 AST and ALT were markedly elevated. 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  Hepatitis C Virus Antibody was reactive with LHD elevated to 479 (94-250 nl), elevated Pro-time of 16.3 ( 11.6-15.2 nl) and with CT of abdomen demonstrating periportal and hilar adenopathy of the liver that can be consistent with hepatitis. To assess acute vs chronic hepatitis status HCV RNA qualitative was obtained in addition to Hep C genotype for possible treatment.  These labs were pending on day of discharge and should be review at his follow-up appoint with subsequent referral to GI if necessary.   Lab 05/06/12 0645 05/05/12 0732 05/04/12 0828 05/03/12 1250 05/02/12 0505 05/01/12 1635  AST 317* 387* 139* -- 85* 64*  ALT 245* 227* 93* -- 60* 51  ALKPHOS 116 115 108 -- 87 79  BILITOT 0.8 0.8 0.8 -- 0.6 0.6    PROT 7.0 6.8 6.5 -- 6.7 7.1  ALBUMIN 3.2* 3.1* 3.1* -- 3.2* 3.3*  INR -- -- -- 1.29 -- --  PT          15.3   3. Bilateral Scrotal Varicocele and bilateral epididymal cyst /spematocele in setting of hydrocele and testicular microlithiasis: likely Grade 2 varicoceles given moderate size, nonvisible when supine, scrotal ultrasound  as above.  Varicoceles usually do not require treatment unless significantly painful or associated with infertility or decreased sperm quality. The primary form of treatment is surgery (surgical ligation vs venous embolization). 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 Epididymal cysts/spermatoceles are usually asymptomatic and do not require further evaluation. Urology was consulted for further recommendations and management. Significance of testicular microlithiasis is unclear at this point as no tumor, abscess or neoplasm is evident on CT of pelvis/abdomen.  Of note HIV, GC/Chlamdia urine probe and RPR was negative.   4. Scrotal wall mass: likely benign secondary to trauma, CT of pelvis w/o abscess findings.  Thus can be followed up with Urology.   5. Tinea Crucis of the groin (Jock Itch): Patient reports scaly itchiness bilateral periscrotal/inguinal region that he "gets sometimes" and usually is alleviated with 1% hydrocortisone. He has been sweating a lot recently.  He was started on Lotrimin lotion to groin area bid with preference for lotion over cream to avoid possible skin maceration.  6. Disposition: Reports having "Orange Card", would like to follow-up with [facility] instead of [facility] as noted on appt records thus f/u scheduled with Internal Medicine Clinic with Dr. Clyde Lundborg July 3rd at 9:15am. See above for further details on follow-up labs and appointments   Discharge Vitals:  BP 99/61  Pulse 51  Temp 97.6 F (36.4 C) (Oral)  Resp 16  Ht 5\' 7"  (1.702 m)  Wt 154 lb 12.8 oz (70.217 kg)  BMI 24.25 kg/m2  SpO2  98%  Discharge Labs:  Results for orders placed during the hospital encounter of 05/01/12 (from the past 24 hour(s))  CBC WITH DIFFERENTIAL     Status: Abnormal   Collection Time   05/06/12  6:45 AM      Component Value Range   WBC 3.7 (*) 4.0 - 10.5 K/uL   RBC 4.53  4.22 - 5.81 MIL/uL   Hemoglobin 13.2  13.0 - 17.0 g/dL   HCT 09.8 (*) 11.9 - 14.7 %   MCV 83.2  78.0 - 100.0 fL   MCH 29.1  26.0 - 34.0 pg   MCHC 35.0  30.0 - 36.0 g/dL   RDW 82.9  56.2 - 13.0 %   Platelets 179  150 - 400 K/uL   Neutrophils Relative 14 (*) 43 - 77 %   Neutro Abs 0.5 (*) 1.7 - 7.7 K/uL   Lymphocytes Relative 72 (*) 12 - 46 %   Lymphs Abs 2.7  0.7 - 4.0 K/uL   Monocytes Relative 10  3 - 12 %   Monocytes Absolute 0.4  0.1 - 1.0 K/uL   Eosinophils Relative 3  0 - 5 %   Eosinophils Absolute 0.1  0.0 - 0.7 K/uL   Basophils Relative 1  0 - 1 %   Basophils Absolute 0.0  0.0 - 0.1 K/uL  COMPREHENSIVE METABOLIC PANEL     Status: Abnormal   Collection Time   05/06/12  6:45 AM      Component Value Range   Sodium 140  135 - 145 mEq/L   Potassium 3.6  3.5 - 5.1 mEq/L   Chloride 104  96 - 112 mEq/L   CO2 27  19 - 32 mEq/L   Glucose, Bld 93  70 - 99 mg/dL   BUN 6  6 - 23 mg/dL   Creatinine, Ser 8.65  0.50 - 1.35 mg/dL   Calcium 9.1  8.4 - 78.4 mg/dL   Total Protein 7.0  6.0 -  8.3 g/dL   Albumin 3.2 (*) 3.5 - 5.2 g/dL   AST 981 (*) 0 - 37 U/L   ALT 245 (*) 0 - 53 U/L   Alkaline Phosphatase 116  39 - 117 U/L   Total Bilirubin 0.8  0.3 - 1.2 mg/dL   GFR calc non Af Amer >90  >90 mL/min   GFR calc Af Amer >90  >90 mL/min    Signed: Shereka Lafortune 05/06/2012, 10:59 AM   Time Spent on Discharge: 1hr

## 2012-05-05 NOTE — Progress Notes (Signed)
Subjective: No acute events overnight, afebrile past 48 hours, overall feeling better but continues to be "achy" all over and itching around scrotum  Objective: Vital signs in last 24 hours: Filed Vitals:   05/04/12 1332 05/04/12 1400 05/04/12 2131 05/05/12 0500  BP: 111/71 109/66 99/57 93/52   Pulse: 55 60 66 54  Temp: 98.2 F (36.8 C) 98.2 F (36.8 C) 98.4 F (36.9 C) 97.7 F (36.5 C)  TempSrc: Oral Oral Oral Oral  Resp: 18 18 20 20   Height:      Weight:      SpO2: 99% 97% 97% 97%   Weight change:   Intake/Output Summary (Last 24 hours) at 05/05/12 1133 Last data filed at 05/05/12 0900  Gross per 24 hour  Intake   1083 ml  Output    840 ml  Net    243 ml   Physical Exam: General: Well-developed, well-nourished, in no acute distress; finishing breakfast Head: Normocephalic, atraumatic, Murky sclera but not overtly jaundice Lungs: Normal respiratory effort. Clear to auscultation bilaterally from apices to bases without crackles or wheezes appreciated. Heart: normal rate, regular rhythm, normal S1 and S2, no gallop, murmur, or rubs appreciated. Abdomen: BS normoactive. Soft, Nondistended, non-tender. No masses or organomegaly appreciated.  GU: scrotal area without rashes evident, cont mild tenderness to palpation Neurologic: grossly non-focal, alert and oriented x3, appropriate and cooperative throughout examination.  Skin: no rashes noted   Lab Results: Basic Metabolic Panel:  Lab 05/05/12 1610 05/04/12 0828  NA 138 137  K 3.7 3.8  CL 102 102  CO2 25 29  GLUCOSE 92 92  BUN 6 5*  CREATININE 0.89 1.06  CALCIUM 9.0 8.8  MG -- --  PHOS -- --   Liver Function Tests:  Lab 05/05/12 0732 05/04/12 0828  AST 387* 139*  ALT 227* 93*  ALKPHOS 115 108  BILITOT 0.8 0.8  PROT 6.8 6.5  ALBUMIN 3.1* 3.1*   CBC:  Lab 05/05/12 0732 05/04/12 0828  WBC 3.9* 2.3*  NEUTROABS 0.5* 0.5*  HGB 13.2 13.7  HCT 37.4* 38.3*  MCV 82.6 83.3  PLT 137* 101*   Urinalysis:  Lab  05/01/12 0835  COLORURINE YELLOW  LABSPEC 1.015  PHURINE 8.0  GLUCOSEU NEGATIVE  HGBUR NEGATIVE  BILIRUBINUR NEGATIVE  KETONESUR NEGATIVE  PROTEINUR NEGATIVE  UROBILINOGEN 1.0  NITRITE NEGATIVE  LEUKOCYTESUR NEGATIVE   Erythrocyte Sedimentation Rate     Component Value Date/Time   ESRSEDRATE 5 05/03/2012 1250    05/03/12 Prothrombin Time/INR PT 16.3 sec  Lab 05/03/12 1250  INR 1.29   Cardiac Panel (last 3 results)  Basename 05/03/12 1250  CKTOTAL 92    Micro Results: Recent Results (from the past 240 hour(s))  CULTURE, BLOOD (ROUTINE X 2)     Status: Normal (Preliminary result)   Collection Time   05/01/12 10:00 AM      Component Value Range Status Comment   Specimen Description BLOOD LEFT ARM   Final    Special Requests BOTTLES DRAWN AEROBIC AND ANAEROBIC 10CC   Final    Culture  Setup Time [id number]   Final    Culture     Final    Value:        BLOOD CULTURE RECEIVED NO GROWTH TO DATE CULTURE WILL BE HELD FOR 5 DAYS BEFORE ISSUING A FINAL NEGATIVE REPORT   Report Status PENDING   Incomplete   CULTURE, BLOOD (ROUTINE X 2)     Status: Normal (Preliminary result)   Collection Time  05/01/12 10:05 AM      Component Value Range Status Comment   Specimen Description BLOOD LEFT ARM   Final    Special Requests BOTTLES DRAWN AEROBIC AND ANAEROBIC 10CC   Final    Culture  Setup Time [id number]   Final    Culture     Final    Value:        BLOOD CULTURE RECEIVED NO GROWTH TO DATE CULTURE WILL BE HELD FOR 5 DAYS BEFORE ISSUING A FINAL NEGATIVE REPORT   Report Status PENDING   Incomplete   CULTURE, BLOOD (ROUTINE X 2)     Status: Normal (Preliminary result)   Collection Time   05/03/12 12:33 PM      Component Value Range Status Comment   Specimen Description BLOOD FOREARM RIGHT   Final    Special Requests BOTTLES DRAWN AEROBIC ONLY 10CC   Final    Culture  Setup Time [id number]   Final    Culture     Final    Value:        BLOOD CULTURE RECEIVED NO GROWTH TO DATE  CULTURE WILL BE HELD FOR 5 DAYS BEFORE ISSUING A FINAL NEGATIVE REPORT   Report Status PENDING   Incomplete   CULTURE, BLOOD (ROUTINE X 2)     Status: Normal (Preliminary result)   Collection Time   05/03/12 12:46 PM      Component Value Range Status Comment   Specimen Description BLOOD HAND LEFT   Final    Special Requests BOTTLES DRAWN AEROBIC ONLY 8CC   Final    Culture  Setup Time [id number]   Final    Culture     Final    Value:        BLOOD CULTURE RECEIVED NO GROWTH TO DATE CULTURE WILL BE HELD FOR 5 DAYS BEFORE ISSUING A FINAL NEGATIVE REPORT   Report Status PENDING   Incomplete    Studies/Results: Ct Biopsy  05/04/2012  *RADIOLOGY REPORT*  Clinical data: Neutropenic fever of unknown origin  CT GUIDED DEEP ILIAC BONE ASPIRATION AND CORE BIOPSY:  Technique: Patient was placed supine on the CT gantry and limited axial scans through the pelvis were obtained. Appropriate skin entry site was identified. Skin site was marked, prepped with Betadine,   draped in usual sterile fashion, and infiltrated locally with 1% lidocaine.  Intravenous Fentanyl and Versed were administered as conscious sedation during continuous cardiorespiratory monitoring by the radiology RN, with a total moderate sedation time of 10 minutes.  Under CT fluoroscopic guidance an 11-gauge Cook trocar bone needle was advanced into the right iliac bone just lateral to the sacroiliac joint. Once needle tip position was confirmed,   core and aspiration samples were obtained. The final sample was obtained using the guiding needle itself, which was then removed. Postprocedure scans show no hematoma or fracture. Patient tolerated procedure well, with no immediate complication.  IMPRESSION:  1. Technically successful CT guided right iliac bone core and aspiration biopsy.  Original Report Authenticated By: Osa Craver, M.D.   Medications: I have reviewed the patient's current medications. Scheduled Meds:    . clotrimazole    Topical BID  . doxycycline  100 mg Oral Q12H  . enoxaparin  40 mg Subcutaneous Q24H  . fentaNYL      . midazolam      . ondansetron (ZOFRAN) IV  4 mg Intravenous Once  . sodium chloride  3 mL Intravenous Q12H   Continuous Infusions:    .  DISCONTD: sodium chloride 100 mL/hr at 05/03/12 2351   PRN Meds:.sodium chloride, acetaminophen, acetaminophen, fentaNYL, HYDROcodone-acetaminophen, midazolam, ondansetron (ZOFRAN) IV, ondansetron, sodium chloride  Assessment/Plan: HD#4 for 41 yo male without significant past medical history admitted with fever, neutropenia, thrombocytopenia and generalized myalgias and arthralgias.   1. Cytopenia with Neutropenic fever:  Leukocytosis improved to 3.9 today. ANC stable at 500 (1599-->800-->500--> 500-->500). Improved thrombocytopenia. Will continue to treat as possible tick exposure ie Ehrlichiosis with delayed response to doxycycline given clinical presentation (?tick exposure, fever, headache, malaise, muscle pain, nausea/vomiting, leukopenia and thrombocytopenia). On doxycyline Day #4 of 10 day course.  Ehrlichiosis panel and [facility] Spotted Fever Antibody neg, BCx neg thus far. Peripheral smear with atypical lymphocytes and >20% bands without schistocytes, s/p one dose Vancomycin and Levaquin in ED.  CT of chest with abnormalities.   Bone Marrow biopsy results pending. -cont doxycycline 100 mg bid for 6 more days -cont saline lock IVF -FUO work-up labs-- --CRP elevated --ferritin elevated --Rheum Factor wnl  --ANA wnl --EBV antibody panel neg --SPEP pending --ACE level pending Quantiferon gold pending CMV IgG and IgM pending  Parvovirus IgG and IgM and Parvovirus PCR on blood   Arbovirus panel from serum (including [place] nile Virus) pending  Lab 05/05/12 0732 05/04/12 0828 05/03/12 0918  HGB 13.2 13.7 14.5  HCT 37.4* 38.3* 40.3  WBC 3.9* 2.3* 1.8*  PLT 137* 101* 112*   CBC    Component Value Date/Time   WBC 3.9* 05/05/2012 0732    RBC 4.53 05/05/2012 0732   HGB 13.2 05/05/2012 0732   HCT 37.4* 05/05/2012 0732   PLT 137* 05/05/2012 0732   MCV 82.6 05/05/2012 0732   MCH 29.1 05/05/2012 0732   MCHC 35.3 05/05/2012 0732   RDW 13.0 05/05/2012 0732   LYMPHSABS 2.9 05/05/2012 0732   MONOABS 0.4 05/05/2012 0732   EOSABS 0.0 05/05/2012 0732   BASOSABS 0.1 05/05/2012 0732        NEUTROABS        0.5      05/05/2012   2. Elevated liver enzymes: c/w possible Erhlichiosis infection and hepatitis, Hepatitis C Virus Antibody reactive with LHD elevated to 479 (94-250 nl), elevated Pro-time of 16.3 ( 11.6-15.2 nl) and with CT of abdomen demonstrating periportal and hilar adenopathy of the liver that can be consistent with hepatitis.    - HCV RNA, qual pending - Hep C genotype pending - liver enzymes today pending   Lab 05/05/12 0732 05/04/12 0828 05/03/12 1250 05/02/12 0505 05/01/12 1635  AST 387* 139* -- 85* 64*  ALT 227* 93* -- 60* 51  ALKPHOS 115 108 -- 87 79  BILITOT 0.8 0.8 -- 0.6 0.6  PROT 6.8 6.5 -- 6.7 7.1  ALBUMIN 3.1* 3.1* -- 3.2* 3.3*  INR -- -- 1.29 -- --    3. 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 Scrotal wall mass: likely benign secondary to trauma, CT of pelvis w/o abscess findings -appreciate Urology recommendations -f/u Urology as outpt if needed  5. intertriginous itching: likely tinea cruris of the groin, pt reports scaly itchiness bilateral periscrotal/inguinal region that he "gets sometimes" and usually is alleviated with 1% hydrocortisone.  He has been sweating a  lot recently and felt the need to take a shower at 4am this morning  5. Disposition:  Reports having "Orange Card", would like to follow-up with [facility] instead of [facility] as noted on appt records thus f/u scheduled with [facility] with Dr. Clyde Lundborg July 3rd at 9:15am    LOS: 4 days   Ricca Melgarejo, Clydie Braun 05/05/2012, 11:33 AM

## 2012-05-06 DIAGNOSIS — M2550 Pain in unspecified joint: Secondary | ICD-10-CM | POA: Diagnosis present

## 2012-05-06 DIAGNOSIS — R51 Headache: Secondary | ICD-10-CM | POA: Diagnosis present

## 2012-05-06 DIAGNOSIS — M791 Myalgia, unspecified site: Secondary | ICD-10-CM | POA: Diagnosis present

## 2012-05-06 DIAGNOSIS — R519 Headache, unspecified: Secondary | ICD-10-CM | POA: Diagnosis present

## 2012-05-06 LAB — COMPREHENSIVE METABOLIC PANEL
ALT: 245 U/L — ABNORMAL HIGH (ref 0–53)
AST: 317 U/L — ABNORMAL HIGH (ref 0–37)
CO2: 27 mEq/L (ref 19–32)
Calcium: 9.1 mg/dL (ref 8.4–10.5)
Glucose, Bld: 93 mg/dL (ref 70–99)
Potassium: 3.6 mEq/L (ref 3.5–5.1)
Sodium: 140 mEq/L (ref 135–145)
Total Bilirubin: 0.8 mg/dL (ref 0.3–1.2)
Total Protein: 7 g/dL (ref 6.0–8.3)

## 2012-05-06 LAB — CBC WITH DIFFERENTIAL/PLATELET
Basophils Absolute: 0 10*3/uL (ref 0.0–0.1)
Eosinophils Relative: 3 % (ref 0–5)
Lymphocytes Relative: 72 % — ABNORMAL HIGH (ref 12–46)
Lymphs Abs: 2.7 10*3/uL (ref 0.7–4.0)
Neutrophils Relative %: 14 % — ABNORMAL LOW (ref 43–77)
Platelets: 179 10*3/uL (ref 150–400)
RBC: 4.53 MIL/uL (ref 4.22–5.81)
RDW: 13.2 % (ref 11.5–15.5)
WBC: 3.7 10*3/uL — ABNORMAL LOW (ref 4.0–10.5)

## 2012-05-06 MED ORDER — CLOTRIMAZOLE 1 % EX CREA: TOPICAL_CREAM | Freq: Two times a day (BID) | CUTANEOUS | Status: DC

## 2012-05-06 MED ORDER — HYDROCODONE-ACETAMINOPHEN 5-325 MG PO TABS: 1.0000 | ORAL_TABLET | ORAL | Status: AC | PRN

## 2012-05-06 MED ORDER — DOXYCYCLINE HYCLATE 100 MG PO TABS: 100.0000 mg | ORAL_TABLET | Freq: Two times a day (BID) | ORAL | Status: AC

## 2012-05-06 NOTE — Progress Notes (Signed)
   CARE MANAGEMENT NOTE 05/06/2012  Patient:  Caporale,Daeron   Account Number:  [id number]  Date Initiated:  05/02/2012  Documentation initiated by:  Letha Cape  Subjective/Objective Assessment:   dx fever and neutropenia  admit- lives with mother.     Action/Plan:   Anticipated DC Date:  05/03/2012   Anticipated DC Plan:  HOME/SELF CARE      DC Planning Services  CM consult  Medication Assistance      Choice offered to / List presented to:             Status of service:  Completed, signed off Medicare Important Message given?   (If response is "NO", the following Medicare IM given date fields will be blank) Date Medicare IM given:   Date Additional Medicare IM given:    Discharge Disposition:  HOME/SELF CARE  Per UR Regulation:  Reviewed for med. necessity/level of care/duration of stay  If discussed at Long Length of Stay Meetings, dates discussed:    Comments:  05/06/2012 1130 Contacted [facility] and pt is eligible for [facility] med assistance fund. Abx Rx to main pharmacy to be filled. Explained to pt that [facility], [facility] and [facility] offers discount prices for meds. Pt will cancel his appt with Jovita Kussmaul. Has appt scheduled with Dr Brien Few her at [facility] for 7/3.  Isidoro Donning RN CCM Case Mgmt phone [phone]  Jovita Kussmaul apt 7/10 at 3 pm.  05/03/12 14:46 Letha Cape RN, BSN [phone] will need to help patient with 3 day supply of meds and abxs.  Patient has transportation at time of dc.  Patient still with fevers today.  05/02/12 17:20 Letha Cape RN, BSN [phone] patient lives with mother.  Patient is eligible for medication ast if needed. NCM will continue to follow for dc needs.

## 2012-05-06 NOTE — Progress Notes (Signed)
Subjective: No acute events overnight, afebrile past 72 hours, still feels achy  Objective: Vital signs in last 24 hours: Filed Vitals:   05/05/12 0500 05/05/12 1342 05/05/12 2136 05/06/12 0533  BP: 93/52 121/69 104/72 99/61  Pulse: 54 65 53 51  Temp: 97.7 F (36.5 C) 97.8 F (36.6 C) 97.6 F (36.4 C) 97.6 F (36.4 C)  TempSrc: Oral Oral Oral Oral  Resp: 20 20 17 16   Height:      Weight:      SpO2: 97% 99% 100% 98%   Weight change:  No intake or output data in the 24 hours ending 05/06/12 0938 Physical Exam: General: Well-developed, well-nourished, in no acute distress; girlfriend at bedside Head: Normocephalic, atraumatic, Murky sclera but not overtly jaundice Lungs: Normal respiratory effort. Clear to auscultation bilaterally from apices to bases without crackles or wheezes appreciated. Heart: normal rate, regular rhythm, normal S1 and S2, no gallop, murmur, or rubs appreciated. Abdomen: BS normoactive. Soft, Nondistended, non-tender. No masses or organomegaly appreciated.  Neurologic: grossly non-focal, alert and oriented x3, appropriate and cooperative throughout examination.  Skin: no rashes noted   Lab Results: Basic Metabolic Panel:  Lab 05/06/12 8119 05/05/12 0732  NA 140 138  K 3.6 3.7  CL 104 102  CO2 27 25  GLUCOSE 93 92  BUN 6 6  CREATININE 0.85 0.89  CALCIUM 9.1 9.0  MG -- --  PHOS -- --   Liver Function Tests:  Lab 05/06/12 0645 05/05/12 0732  AST 317* 387*  ALT 245* 227*  ALKPHOS 116 115  BILITOT 0.8 0.8  PROT 7.0 6.8  ALBUMIN 3.2* 3.1*   CBC:  Lab 05/06/12 0645 05/05/12 0732  WBC 3.7* 3.9*  NEUTROABS 0.5* 0.5*  HGB 13.2 13.2  HCT 37.7* 37.4*  MCV 83.2 82.6  PLT 179 137*   Urinalysis:  Lab 05/01/12 0835  COLORURINE YELLOW  LABSPEC 1.015  PHURINE 8.0  GLUCOSEU NEGATIVE  HGBUR NEGATIVE  BILIRUBINUR NEGATIVE  KETONESUR NEGATIVE  PROTEINUR NEGATIVE  UROBILINOGEN 1.0  NITRITE NEGATIVE  LEUKOCYTESUR NEGATIVE   Erythrocyte  Sedimentation Rate     Component Value Date/Time   ESRSEDRATE 5 05/03/2012 1250    05/03/12 Prothrombin Time/INR PT 16.3 sec  Lab 05/03/12 1250  INR 1.29   Cardiac Panel (last 3 results)  Basename 05/03/12 1250  CKTOTAL 92    Micro Results: Recent Results (from the past 240 hour(s))  CULTURE, BLOOD (ROUTINE X 2)     Status: Normal (Preliminary result)   Collection Time   05/01/12 10:00 AM      Component Value Range Status Comment   Specimen Description BLOOD LEFT ARM   Final    Special Requests BOTTLES DRAWN AEROBIC AND ANAEROBIC 10CC   Final    Culture  Setup Time [id number]   Final    Culture     Final    Value:        BLOOD CULTURE RECEIVED NO GROWTH TO DATE CULTURE WILL BE HELD FOR 5 DAYS BEFORE ISSUING A FINAL NEGATIVE REPORT   Report Status PENDING   Incomplete   CULTURE, BLOOD (ROUTINE X 2)     Status: Normal (Preliminary result)   Collection Time   05/01/12 10:05 AM      Component Value Range Status Comment   Specimen Description BLOOD LEFT ARM   Final    Special Requests BOTTLES DRAWN AEROBIC AND ANAEROBIC 10CC   Final    Culture  Setup Time [id number]   Final  Culture     Final    Value:        BLOOD CULTURE RECEIVED NO GROWTH TO DATE CULTURE WILL BE HELD FOR 5 DAYS BEFORE ISSUING A FINAL NEGATIVE REPORT   Report Status PENDING   Incomplete   CULTURE, BLOOD (ROUTINE X 2)     Status: Normal (Preliminary result)   Collection Time   05/03/12 12:33 PM      Component Value Range Status Comment   Specimen Description BLOOD FOREARM RIGHT   Final    Special Requests BOTTLES DRAWN AEROBIC ONLY 10CC   Final    Culture  Setup Time [id number]   Final    Culture     Final    Value:        BLOOD CULTURE RECEIVED NO GROWTH TO DATE CULTURE WILL BE HELD FOR 5 DAYS BEFORE ISSUING A FINAL NEGATIVE REPORT   Report Status PENDING   Incomplete   CULTURE, BLOOD (ROUTINE X 2)     Status: Normal (Preliminary result)   Collection Time   05/03/12 12:46 PM      Component Value  Range Status Comment   Specimen Description BLOOD HAND LEFT   Final    Special Requests BOTTLES DRAWN AEROBIC ONLY 8CC   Final    Culture  Setup Time [id number]   Final    Culture     Final    Value:        BLOOD CULTURE RECEIVED NO GROWTH TO DATE CULTURE WILL BE HELD FOR 5 DAYS BEFORE ISSUING A FINAL NEGATIVE REPORT   Report Status PENDING   Incomplete    Studies/Results: Ct Biopsy  05/04/2012  *RADIOLOGY REPORT*  Clinical data: Neutropenic fever of unknown origin  CT GUIDED DEEP ILIAC BONE ASPIRATION AND CORE BIOPSY:  Technique: Patient was placed supine on the CT gantry and limited axial scans through the pelvis were obtained. Appropriate skin entry site was identified. Skin site was marked, prepped with Betadine,   draped in usual sterile fashion, and infiltrated locally with 1% lidocaine.  Intravenous Fentanyl and Versed were administered as conscious sedation during continuous cardiorespiratory monitoring by the radiology RN, with a total moderate sedation time of 10 minutes.  Under CT fluoroscopic guidance an 11-gauge Cook trocar bone needle was advanced into the right iliac bone just lateral to the sacroiliac joint. Once needle tip position was confirmed,   core and aspiration samples were obtained. The final sample was obtained using the guiding needle itself, which was then removed. Postprocedure scans show no hematoma or fracture. Patient tolerated procedure well, with no immediate complication.  IMPRESSION:  1. Technically successful CT guided right iliac bone core and aspiration biopsy.  Original Report Authenticated By: Osa Craver, M.D.   Medications: I have reviewed the patient's current medications. Scheduled Meds:    . clotrimazole   Topical BID  . doxycycline  100 mg Oral Q12H  . enoxaparin  40 mg Subcutaneous Q24H  . ondansetron (ZOFRAN) IV  4 mg Intravenous Once  . sodium chloride  3 mL Intravenous Q12H  . DISCONTD: clotrimazole   Topical BID   Continuous  Infusions:   PRN Meds:.sodium chloride, acetaminophen, acetaminophen, HYDROcodone-acetaminophen, ondansetron (ZOFRAN) IV, ondansetron, sodium chloride  Assessment/Plan: HD#5 for 41 yo male without significant past medical history admitted with fever, neutropenia, thrombocytopenia and generalized myalgias and arthralgias.   1. Cytopenia with Neutropenic fever:  Resolved thrombocytopenia, Leukocytosis resolving toward normal. ANC stable at 500 (1599-->800-->500--> 500-->500-->).  Will continue to  treat as possible tick exposure ie Ehrlichiosis with delayed response to doxycycline given clinical presentation (?tick exposure, fever, headache, malaise, muscle pain, nausea/vomiting, leukopenia and thrombocytopenia). On doxycyline Day #5 of 10 day course.  Ehrlichiosis panel and [facility] Spotted Fever Antibody neg, BCx neg thus far. Peripheral smear with atypical lymphocytes and >20% bands without schistocytes, s/p one dose Vancomycin and Levaquin in ED.  CT of chest with abnormalities.   Bone Marrow biopsy results pending. -cont doxycycline 100 mg bid for 5 more days -cont saline lock IVF -FUO work-up labs-- --CRP elevated --ferritin elevated --Rheum Factor wnl  --ANA wnl --EBV antibody panel neg --ACE level pending --Quantiferon gold pending --CMV IgG and IgM pending  --Parvovirus IgG and IgM and Parvovirus PCR on blood   --Arbovirus panel from serum (including [place] Virus) pending  Lab 05/06/12 0645 05/05/12 0732 05/04/12 0828  HGB 13.2 13.2 13.7  HCT 37.7* 37.4* 38.3*  WBC 3.7* 3.9* 2.3*  PLT 179 137* 101*   CBC    Component Value Date/Time   WBC 3.7* 05/06/2012 0645   RBC 4.53 05/06/2012 0645   HGB 13.2 05/06/2012 0645   HCT 37.7* 05/06/2012 0645   PLT 179 05/06/2012 0645   MCV 83.2 05/06/2012 0645   MCH 29.1 05/06/2012 0645   MCHC 35.0 05/06/2012 0645   RDW 13.2 05/06/2012 0645   LYMPHSABS 2.7 05/06/2012 0645   MONOABS 0.4 05/06/2012 0645   EOSABS 0.1 05/06/2012 0645    BASOSABS 0.0 05/06/2012 0645        NEUTROABS        0.5      05/06/2012   2. Elevated liver enzymes: c/w possible Erhlichiosis infection and hepatitis, Hepatitis C Virus Antibody reactive with LHD elevated to 479 (94-250 nl), elevated Pro-time of 16.3 ( 11.6-15.2 nl) and with CT of abdomen demonstrating periportal and hilar adenopathy of the liver that can be consistent with hepatitis.   - HCV RNA, qual pending - Hep C genotype pending  Lab 05/06/12 0645 05/05/12 0732 05/04/12 0828 05/03/12 1250 05/02/12 0505 05/01/12 1635  AST 317* 387* 139* -- 85* 64*  ALT 245* 227* 93* -- 60* 51  ALKPHOS 116 115 108 -- 87 79  BILITOT 0.8 0.8 0.8 -- 0.6 0.6  PROT 7.0 6.8 6.5 -- 6.7 7.1  ALBUMIN 3.2* 3.1* 3.1* -- 3.2* 3.3*  INR -- -- -- 1.29 -- --    3. 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 Scrotal wall mass: likely benign secondary to trauma, CT of pelvis w/o abscess findings -appreciate Urology recommendations -f/u Urology as outpt if needed  5.Tinea Cruris: pt reports scaly itchiness bilateral periscrotal/inguinal region that he "gets sometimes" and usually is alleviated with 1% hydrocortisone.  No c/o itchiness since starting Lomitrin yesterday -continue antifungal lotion  6. Disposition: Reports having "Orange Card", would like to follow-up with [facility] instead of [facility] as noted on appt records -f/u scheduled with [facility] with Dr. Clyde Lundborg July 3rd at 9:15am -f/u to be scheduled with ID for convalescent titers    LOS: 5 days   Brailee Riede 05/06/2012, 9:38 AM

## 2012-05-06 NOTE — Progress Notes (Signed)
Bruce [name] to be D/C'd Home per MD order.  Discussed with the patient and all questions fully answered.   Jeshua, [place]  Home Medication Instructions ZOX:[id number]   Printed on:05/06/12 1533  Medication Information                    ibuprofen (ADVIL,MOTRIN) 200 MG tablet Take 200 mg by mouth every 6 (six) hours as needed. For pain           diphenhydrAMINE (BENADRYL) 25 mg capsule Take 25 mg by mouth every 6 (six) hours as needed. For allergies           Multiple Vitamin (MULTIVITAMIN WITH MINERALS) TABS Take 1 tablet by mouth daily.           clotrimazole (LOTRIMIN) 1 % cream Apply topically 2 (two) times daily. For 3 days           doxycycline (VIBRA-TABS) 100 MG tablet Take 1 tablet (100 mg total) by mouth every 12 (twelve) hours.           HYDROcodone-acetaminophen (NORCO) 5-325 MG per tablet Take 1 tablet by mouth every 4 (four) hours as needed.             VVS, Skin clean, dry and intact without evidence of skin break down, no evidence of skin tears noted. IV catheter discontinued intact. Site without signs and symptoms of complications. Dressing and pressure applied.  An After Visit Summary was printed and given to the patient. Follow up appointments , new prescriptions and medication administration times given Patient escorted via WC, and D/C home via private auto.  Cindra Eves, RN 05/06/2012 3:33 PM

## 2012-05-07 LAB — HEPATITIS C VRS RNA DETECT BY PCR-QUAL: Hepatitis C Vrs RNA by PCR-Qual: POSITIVE — AB

## 2012-05-08 LAB — HUMAN PARVOVIRUS DNA DETECTION BY PCR: Parvovirus B19, PCR: NOT DETECTED

## 2012-05-08 LAB — CULTURE, BLOOD (ROUTINE X 2)
Culture: NO GROWTH
Culture: NO GROWTH

## 2012-05-08 LAB — HETEROPHILE AB, REFLEX TO TITER, BLOOD: Heterophile Ab Screen: NEGATIVE

## 2012-05-09 ENCOUNTER — Encounter: Payer: Self-pay | Admitting: Internal Medicine

## 2012-05-09 LAB — CULTURE, BLOOD (ROUTINE X 2): Culture: NO GROWTH

## 2012-05-16 ENCOUNTER — Encounter: Payer: Self-pay | Admitting: Internal Medicine

## 2012-05-16 ENCOUNTER — Ambulatory Visit (INDEPENDENT_AMBULATORY_CARE_PROVIDER_SITE_OTHER): Payer: Self-pay | Admitting: Internal Medicine

## 2012-05-16 VITALS — BP 124/83 | HR 81 | Temp 97.9°F | Ht 67.0 in | Wt 156.0 lb

## 2012-05-16 DIAGNOSIS — Z8619 Personal history of other infectious and parasitic diseases: Secondary | ICD-10-CM | POA: Insufficient documentation

## 2012-05-16 DIAGNOSIS — R5081 Fever presenting with conditions classified elsewhere: Secondary | ICD-10-CM

## 2012-05-16 DIAGNOSIS — A938 Other specified arthropod-borne viral fevers: Secondary | ICD-10-CM

## 2012-05-16 DIAGNOSIS — R51 Headache: Secondary | ICD-10-CM

## 2012-05-16 DIAGNOSIS — B1920 Unspecified viral hepatitis C without hepatic coma: Secondary | ICD-10-CM

## 2012-05-16 DIAGNOSIS — N4340 Spermatocele of epididymis, unspecified: Secondary | ICD-10-CM

## 2012-05-16 DIAGNOSIS — D709 Neutropenia, unspecified: Secondary | ICD-10-CM

## 2012-05-16 LAB — COMPLETE METABOLIC PANEL WITH GFR
AST: 40 U/L — ABNORMAL HIGH (ref 0–37)
Alkaline Phosphatase: 81 U/L (ref 39–117)
GFR, Est Non African American: >89 mL/min
Glucose, Bld: 106 mg/dL — ABNORMAL HIGH (ref 70–99)
Potassium: 4.1 mEq/L (ref 3.5–5.3)
Sodium: 140 mEq/L (ref 135–145)
Total Bilirubin: 0.7 mg/dL (ref 0.3–1.2)
Total Protein: 7.6 g/dL (ref 6.0–8.3)

## 2012-05-16 LAB — CBC WITH DIFFERENTIAL/PLATELET
Eosinophils Relative: 1 % (ref 0–5)
HCT: 41.3 % (ref 39.0–52.0)
Hemoglobin: 14.3 g/dL (ref 13.0–17.0)
Lymphocytes Relative: 37 % (ref 12–46)
MCHC: 34.6 g/dL (ref 30.0–36.0)
MCV: 85.2 fL (ref 78.0–100.0)
Monocytes Absolute: 0.4 10*3/uL (ref 0.1–1.0)
Monocytes Relative: 10 % (ref 3–12)
Neutro Abs: 2.1 10*3/uL (ref 1.7–7.7)
WBC: 4 10*3/uL (ref 4.0–10.5)

## 2012-05-16 LAB — EPSTEIN-BARR VIRUS VCA ANTIBODY PANEL: EBV EA IgG: <5 U/mL (ref <9.0)

## 2012-05-16 NOTE — Assessment & Plan Note (Addendum)
Will check titers again to see if now positive to confirm tick borne illness.  No current complaints.   Can follow up PRN.

## 2012-05-16 NOTE — Assessment & Plan Note (Signed)
No concerning signs.  Will need IM follow up.

## 2012-05-16 NOTE — Assessment & Plan Note (Signed)
Has active hepatitis C.  Genotype 1b.  Has detectable virus (qualitative done, no quantitative).  Will need hepatology referral.  Abstain from alcohol.  No signs of cirrhosis with normal platelets, though albumin is a bit low.  Imaging noted.

## 2012-05-16 NOTE — Assessment & Plan Note (Signed)
Will defer referral to IM.  Has rescheduled appt.

## 2012-05-16 NOTE — Assessment & Plan Note (Signed)
Fever resolvved.  Recheck CBC.

## 2012-05-16 NOTE — Progress Notes (Signed)
  Subjective:    [name], male    DOB: 16-Sep-1971, 41 y.o.   MRN: [id number]  HPI Here for hospital follow up.  Presented with fever, myalgias, no obvious etiology.  Did have positive hepatitis C Ab.  Some transaminitis, leukopenia.  HIV neg.  Ehrlichia, RMSF neg.  CMV IgM and G negative, EBV negative.  Treated with doxy, though patient is still taking so not likely compliant.  No current fever, chills.  Does complain of continued headace, relieved with OTC meds.    Review of Systems  Constitutional: Negative for fever, chills, diaphoresis, activity change, appetite change, fatigue and unexpected weight change.  HENT: Negative for neck stiffness.   Cardiovascular: Negative for chest pain, palpitations and leg swelling.  Gastrointestinal: Negative for nausea, abdominal pain, diarrhea and constipation.  Genitourinary: Positive for scrotal swelling. Negative for dysuria, frequency, hematuria and genital sores.  Musculoskeletal: Negative for myalgias, back pain, joint swelling and arthralgias.  Skin: Negative for rash.  Neurological: Positive for headaches. Negative for dizziness and light-headedness.  Hematological: Negative for adenopathy.  Psychiatric/Behavioral: Negative for dysphoric mood. The patient is not nervous/anxious.        Objective:   Physical Exam  Constitutional: He appears well-developed and well-nourished. No distress.  HENT:  Mouth/Throat: Oropharynx is clear and moist. No oropharyngeal exudate.  Cardiovascular: Normal rate, regular rhythm and normal heart sounds.  Exam reveals no gallop and no friction rub.   No murmur heard. Pulmonary/Chest: Effort normal and breath sounds normal. No respiratory distress. He has no wheezes. He has no rales.  Abdominal: Soft. Bowel sounds are normal. He exhibits no distension. There is no tenderness. There is no rebound.  Lymphadenopathy:    He has no cervical adenopathy.  Skin: Skin is warm and dry. No rash noted. No  erythema.          Assessment & Plan:

## 2012-05-17 LAB — ROCKY MTN SPOTTED FVR AB, IGG-BLOOD: RMSF IgG: 0.32 IV

## 2012-05-17 LAB — EHRLICHIA ANTIBODY PANEL
E chaffeensis (HGE) Ab, IgG: NEGATIVE
E chaffeensis (HGE) Ab, IgM: NEGATIVE

## 2012-05-22 ENCOUNTER — Encounter: Payer: Self-pay | Admitting: Internal Medicine

## 2012-05-22 ENCOUNTER — Ambulatory Visit (INDEPENDENT_AMBULATORY_CARE_PROVIDER_SITE_OTHER): Payer: Self-pay | Admitting: Internal Medicine

## 2012-05-22 VITALS — BP 102/63 | HR 81 | Temp 97.4°F | Ht 67.0 in | Wt 149.3 lb

## 2012-05-22 DIAGNOSIS — L298 Other pruritus: Secondary | ICD-10-CM

## 2012-05-22 DIAGNOSIS — L2989 Other pruritus: Secondary | ICD-10-CM

## 2012-05-22 DIAGNOSIS — K029 Dental caries, unspecified: Secondary | ICD-10-CM

## 2012-05-22 DIAGNOSIS — N5089 Other specified disorders of the male genital organs: Secondary | ICD-10-CM

## 2012-05-22 DIAGNOSIS — A938 Other specified arthropod-borne viral fevers: Secondary | ICD-10-CM

## 2012-05-22 DIAGNOSIS — N508 Other specified disorders of male genital organs: Secondary | ICD-10-CM

## 2012-05-22 DIAGNOSIS — B1920 Unspecified viral hepatitis C without hepatic coma: Secondary | ICD-10-CM

## 2012-05-22 DIAGNOSIS — L299 Pruritus, unspecified: Secondary | ICD-10-CM

## 2012-05-22 DIAGNOSIS — R768 Other specified abnormal immunological findings in serum: Secondary | ICD-10-CM

## 2012-05-22 DIAGNOSIS — R51 Headache: Secondary | ICD-10-CM

## 2012-05-22 MED ORDER — DIPHENHYDRAMINE HCL 25 MG PO CAPS: 25.0000 mg | ORAL_CAPSULE | Freq: Four times a day (QID) | ORAL | Status: DC | PRN

## 2012-05-22 NOTE — Assessment & Plan Note (Signed)
Patient has a very poor dental care. He only has 8 teeth left in his mouth. He has moderate pain over right upper second molar. There is care is in that tooth. He does not have abscess, fever or chills currently. Will give dental referral.

## 2012-05-22 NOTE — Assessment & Plan Note (Signed)
Patient hepatitis C antibody was positive with genotype of 1B. His AST and ALT were 40 and 51 at 04/16/12. Will check his hepatitis C viral load and give referral to hepatology.

## 2012-05-22 NOTE — Assessment & Plan Note (Signed)
Patient reports having chronic headache for more than 5 years. Today he complains having headache in the frontal area. Per patient, his head is not associated with any alarming symptoms, such as vision change, hearing change, weakness, numbness or decreased sensation in extremities. His physical examination did not show any focal neurological signs. Patient asked for Vicodin for his headache, I explained to him about the side effects of narcotics,  and advised him to take ibuprofen (he is not a good candidate for Tylenol due to recent abnormal liver function and alcohol drinking). Patient reports drinking 6 beers every week. Patient was educated to quit his alcohol drinking which may help his headache.

## 2012-05-22 NOTE — Patient Instructions (Signed)
1. please quit drinking. It will help your headache. It is also very important for protecting your liver. 2. Please take all medications as prescribed.  3. If you have worsening of your symptoms or new symptoms arise, please call the clinic ([phone]), or go to the ER immediately if symptoms are severe.

## 2012-05-22 NOTE — Progress Notes (Signed)
[name], male   DOB: January 21, 1971, 41 y.o.   MRN: [id number]  Subjective:   [name] male   DOB: 03-06-1971 41 y.o.   MRN: [id number]  HPI: [name] is a 41 y.o. with a past medical history as outlined below, who presents for a hospital followup visit.  Patient was recently hospitalized from 05/01/12 to 60/30/13 because of possible tick born fever. Patient had elevated AST (387) and ALT (227), neutropenia fever and thrombocytopenia in hospitalization. His RMSF IgG and IgM were equivocal at 05/16/12. He had negative IgM and IgG for Ehrilichia. His CMV IgG and IgM were negative; and EBV was negative. Patient completed 2 week course of doxycycline. His repeat blood tests showed that his neutropenia and thrombocytopenia resolved at 05/16/12. His AST and ALT were trending down to 40 and 51, respectively. His bone marrow biopsy on 05/04/12 showed normal cellular bone marrow. Today patient feels good. He does not have nausea, abdominal pain, diarrhea, fever or chills.   Patient reports that after cutting grass yesterday, he started feeling itchy and burning in his face. There are no rashes.  He tried Benadryl which helped and he would like to have a prescription today.  Patient reports that he has been having chronic headache in the past 5 to 6 years it is located at the frontal areas. It is dull, aching-like and nonradiating. It is not associated with weakness, numbness or decreased sensation in his extremities.   Past Medical History  Diagnosis Date  . No pertinent past medical history    Current Outpatient Prescriptions  Medication Sig Dispense Refill  . clotrimazole (LOTRIMIN) 1 % cream Apply topically 2 (two) times daily. For 3 days  30 g  0  . Multiple Vitamin (MULTIVITAMIN WITH MINERALS) TABS Take 1 tablet by mouth daily.      . diphenhydrAMINE (BENADRYL) 25 mg capsule Take 25 mg by mouth every 6 (six) hours as needed. For allergies      . ibuprofen  (ADVIL,MOTRIN) 200 MG tablet Take 200 mg by mouth every 6 (six) hours as needed. For pain       No family history on file. History   Social History  . Marital Status: Single    Spouse Name: N/A    Number of Children: 0  . Years of Education: N/A   Occupational History  .     Social History Main Topics  . Smoking status: Passive Smoker    Types: Cigarettes  . Smokeless tobacco: Never Used  . Alcohol Use: No  . Drug Use: No  . Sexually Active: None   Other Topics Concern  . None   Social History Narrative  . None   Review of Systems: General: no fevers, chills, no changes in body weight, no changes in appetite Skin: no rash HEENT: no blurry vision, hearing changes or sore throat. Has frontal headache. Has facial itchy. Pulm: no dyspnea, coughing, wheezing CV: no chest pain, palpitations, shortness of breath Abd: no nausea/vomiting, abdominal pain, diarrhea/constipation GU: no dysuria, hematuria. Has mild pain in his scrotum.  Ext: no arthralgias, myalgias Neuro: no weakness, numbness, or tingling   Objective:  Physical Exam: Filed Vitals:   05/22/12 1444  BP: 102/63  Pulse: 81  Temp: 97.4 F (36.3 C)  TempSrc: Oral  Height: 5\' 7"  (1.702 m)  Weight: 149 lb 4.8 oz (67.722 kg)  SpO2: 98%   General: resting in bed, not in acute distress HEENT: PERRL, EOMI, no scleral icterus  Cardiac: S1/S2, RRR, No murmurs, gallops or rubs Pulm: Good air movement bilaterally, Clear to auscultation bilaterally, No rales, wheezing, rhonchi or rubs. Abd: Soft,  nondistended, nontender, no rebound pain, no organomegaly, BS present Ext: No rashes or edema, 2+DP/PT pulse bilaterally GU: epididymis non-tender to palpation, has palpable swellings within scrotum which reduce lying down, mildly tender. Musculoskeletal: No joint deformities, erythema, or stiffness, ROM full and nontender Skin: no rashes. No skin bruise. Neuro: alert and oriented X3, cranial nerves II-XII grossly intact,  muscle strength 5/5 in all extremeties,  sensation to light touch intact.  Psych.: patient is not psychotic, no suicidal or hemocidal ideation.   Assessment & Plan:

## 2012-05-22 NOTE — Assessment & Plan Note (Addendum)
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 Patient also has history of spermatocele of epidiymis, bilateral varicoceles and bilateral hydrocele. He still has mild pain in his scrotum currently. CT of pelvis did not show abscess findings. Currently,  there is no signs of infection. His urinalysis was negative. He has negative GC/Chlamydia. His scrotal mass may be benign secondary to trauma, but given his complicated scrotal problems, will give a referral to urology for further evaluation.

## 2012-05-22 NOTE — Assessment & Plan Note (Signed)
Patient was recently hospitalized because of possible tick born fever. He had elevated liver enzymes, neutropenia, fever and thrombocytopenia. His RMSF IgG and IgM were equivocal. He has a negative tests for Ehrlichia, EBV, CMV. His bone marrow biopsy showed normal cellular bone marrow. Patient completed 2 week course of doxycycline. His repeat blood tests showed that his neutropenia and thrombocytopenia resolved. His AST and ALT were trending down to 40 and 51, respectively. Today, he does not have nausea, abdominal pain, diarrhea, fever or chills. There is no further work up or treatment needed at this time. Will follow up at clinic.

## 2012-05-22 NOTE — Assessment & Plan Note (Signed)
Patient complains having facial and nose itchy after cutting grass yesterday. He said it it is likely due to poison ivy, but there is no rashes or skin lesions on his face. There is no typical linear skin lesions on his arms. It is likely due to some type of allergy to grass contact. We'll treat him symptomatically with Benadryl.

## 2012-05-24 ENCOUNTER — Emergency Department (HOSPITAL_COMMUNITY)
Admission: EM | Admit: 2012-05-24 | Discharge: 2012-05-24 | Disposition: A | Discharge status: Home / Self Care | Payer: Self-pay | Attending: Emergency Medicine | Admitting: Emergency Medicine

## 2012-05-24 ENCOUNTER — Encounter (HOSPITAL_COMMUNITY): Payer: Self-pay | Admitting: *Deleted

## 2012-05-24 DIAGNOSIS — F17200 Nicotine dependence, unspecified, uncomplicated: Secondary | ICD-10-CM | POA: Insufficient documentation

## 2012-05-24 DIAGNOSIS — L259 Unspecified contact dermatitis, unspecified cause: Secondary | ICD-10-CM | POA: Insufficient documentation

## 2012-05-24 LAB — HEPATITIS C RNA QUANTITATIVE
HCV Quantitative Log: 5.85 {Log} — ABNORMAL HIGH (ref <1.63)
HCV Quantitative: 700490 IU/mL — ABNORMAL HIGH (ref <43)

## 2012-05-24 MED ORDER — PREDNISONE 20 MG PO TABS: 60.0000 mg | ORAL_TABLET | Freq: Once | ORAL | Status: AC

## 2012-05-24 MED ORDER — DIPHENHYDRAMINE HCL 25 MG PO CAPS
25.0000 mg | ORAL_CAPSULE | Freq: Once | ORAL | Status: AC
  Administered 2012-05-24: 25 mg via ORAL
  Filled 2012-05-24: qty 1

## 2012-05-24 MED ORDER — PREDNISONE 20 MG PO TABS
60.0000 mg | ORAL_TABLET | Freq: Once | ORAL | Status: AC
  Administered 2012-05-24: 60 mg via ORAL
  Filled 2012-05-24: qty 3

## 2012-05-24 MED ORDER — DIPHENHYDRAMINE HCL 25 MG PO CAPS: 25.0000 mg | ORAL_CAPSULE | Freq: Four times a day (QID) | ORAL | Status: AC | PRN

## 2012-05-24 NOTE — ED Notes (Signed)
Pt c/o rash to forearms, face since yesterday.  States he was out "cutting bushes".  Rash itches.  Denies SOB, respiratory distress.

## 2012-05-24 NOTE — ED Provider Notes (Signed)
History     CSN: [id number]  Arrival date & time 05/24/12  0056   First MD Initiated Contact with Patient 05/24/12 0244      Chief Complaint  Patient presents with  . Rash    (Consider location/radiation/quality/duration/timing/severity/associated sxs/prior treatment) HPI Comments: Patient states he was clearing brush yesterday and walking to the [place] in several hours later.  He noticed, that he had a burning sensation on his forearms, and face, which are the only skin surfaces that were exposed during his work episodes.  This is progressively gotten worse throughout the day.  He has not taken any over-the-counter medications.  Patient is a 41 y.o. male presenting with rash. The history is provided by the patient.  Rash  This is a new problem. The current episode started yesterday. The problem has not changed since onset.The problem is associated with nothing.    Past Medical History  Diagnosis Date  . No pertinent past medical history     Past Surgical History  Procedure Date  . No past surgeries   . Finger surgery     infection, middle left    History reviewed. No pertinent family history.  History  Substance Use Topics  . Smoking status: Passive Smoker    Types: Cigarettes  . Smokeless tobacco: Never Used  . Alcohol Use: No      Review of Systems  Constitutional: Negative for fever and chills.  Gastrointestinal: Negative for nausea.  Musculoskeletal: Negative for myalgias.  Skin: Positive for rash.  Neurological: Negative for dizziness and weakness.    Allergies  Penicillins  Home Medications   Current Outpatient Rx  Name Route Sig Dispense Refill  . ADULT MULTIVITAMIN W/MINERALS CH Oral Take 1 tablet by mouth daily.    Marland Kitchen DIPHENHYDRAMINE HCL 25 MG PO CAPS Oral Take 1 capsule (25 mg total) by mouth every 6 (six) hours as needed for itching. 30 capsule 0  . PREDNISONE 20 MG PO TABS Oral Take 3 tablets (60 mg total) by mouth once. 12 tablet 0    BP  128/77  Pulse 64  Temp 98.1 F (36.7 C) (Oral)  Resp 20  SpO2 98%  Physical Exam  Constitutional: He appears well-developed and well-nourished.  Eyes: Pupils are equal, round, and reactive to light.  Neck: Normal range of motion.  Cardiovascular: Normal rate.   Musculoskeletal: Normal range of motion. He exhibits no edema and no tenderness.  Skin: Skin is warm. Rash noted. There is erythema.       Utilized rash on patient.  Space, and upper scalp, and forearms circumferentially, and dorsal aspect of hand    ED Course  Procedures (including critical care time)  Labs Reviewed - No data to display No results found.   1. Contact dermatitis       MDM   This appears to be a contact dermatitis.  I will treat with prednisone, and Benadryl for a six-day course patient is to return if there is worsening.  Symptoms        Arman Filter, NP 05/24/12 0249  Arman Filter, NP 05/24/12 [phone]

## 2012-05-24 NOTE — ED Provider Notes (Signed)
Medical screening examination/treatment/procedure(s) were performed by non-physician practitioner and as supervising physician I was immediately available for consultation/collaboration.  Jakirah Zaun, MD 05/24/12 0533 

## 2012-06-13 ENCOUNTER — Encounter: Payer: Self-pay | Admitting: *Deleted

## 2012-06-13 ENCOUNTER — Encounter: Payer: Self-pay | Admitting: Infectious Disease

## 2012-07-02 IMAGING — CT CT CHEST W/ CM
2 of 3 series · 16 of 36 positions shown, 20 images · IV contrast (APPLIED)
Comparison: None.

CLINICAL DATA: Fever unknown origin.  Shortness of breath.

CT CHEST WITH CONTRAST
TECHNIQUE: Multidetector CT imaging of the chest was performed
following the standard protocol during bolus administration of
intravenous contrast.
Contrast: 80mL OMNIPAQUE IOHEXOL 300 MG/ML  SOLN

[Series 2: routine chest 5.0 st · axial · 0.70mm/px · z∈[-399,-149]mm · 13 of 58 slices shown, 17 images]
[im 5/58  mediastinal]
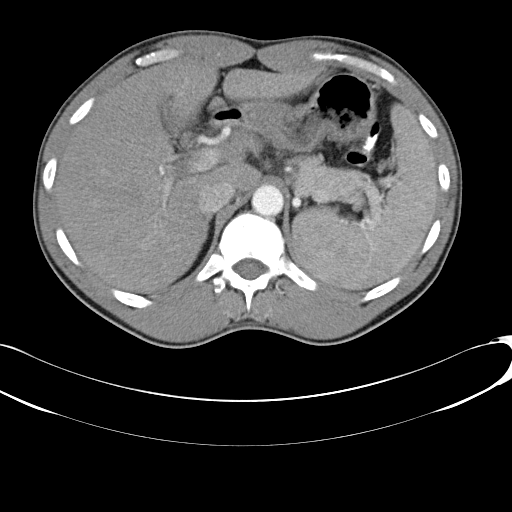
[im 5/58  lung]
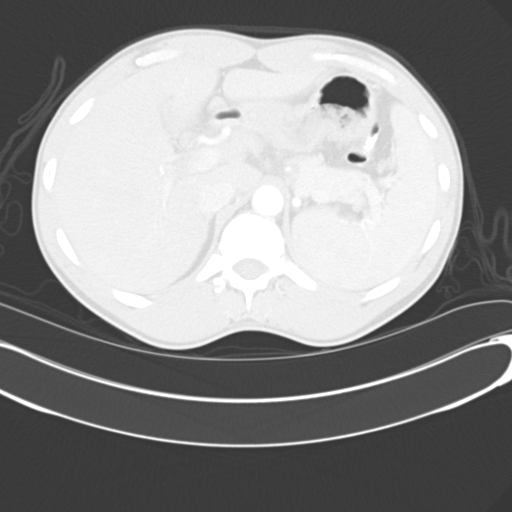
[im 9/58  lung]
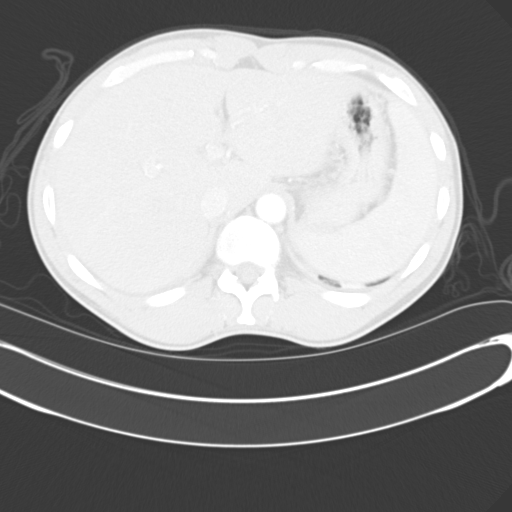
[im 13/58  lung]
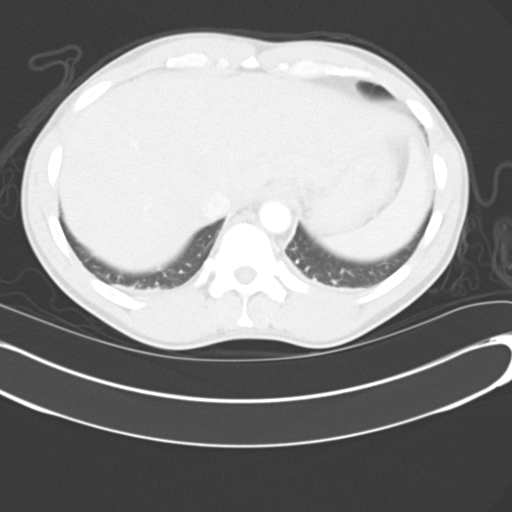
[im 17/58  lung]
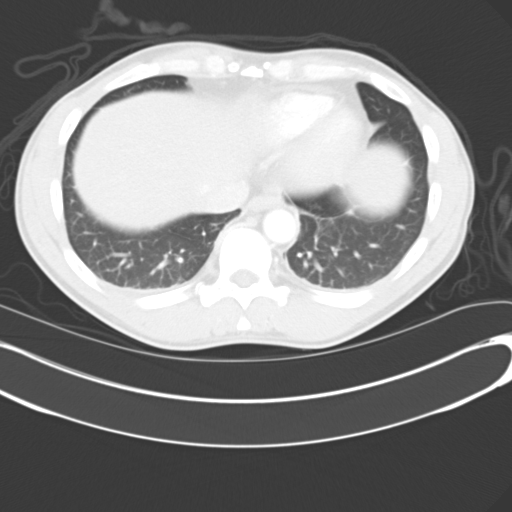
[im 22/58  mediastinal]
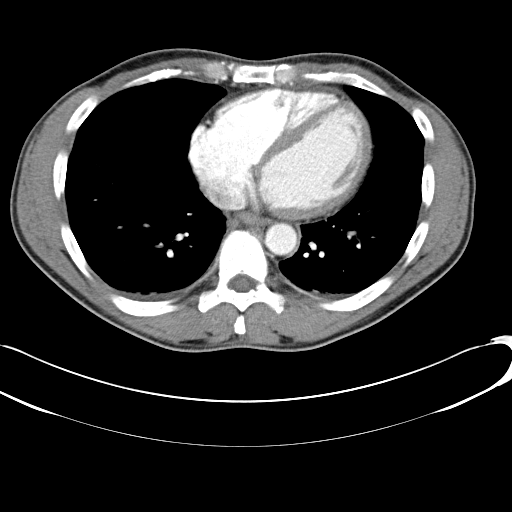
[im 22/58  lung]
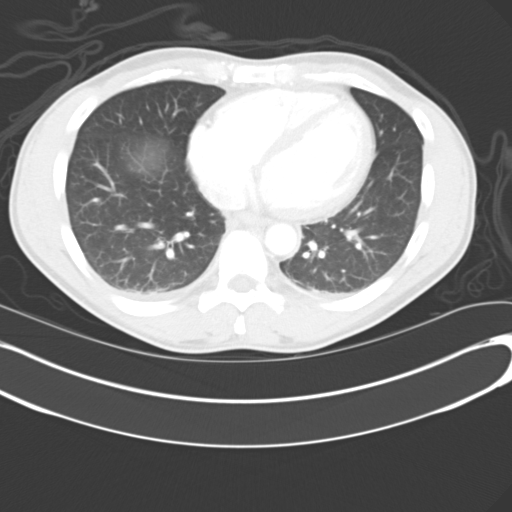
[im 26/58  lung]
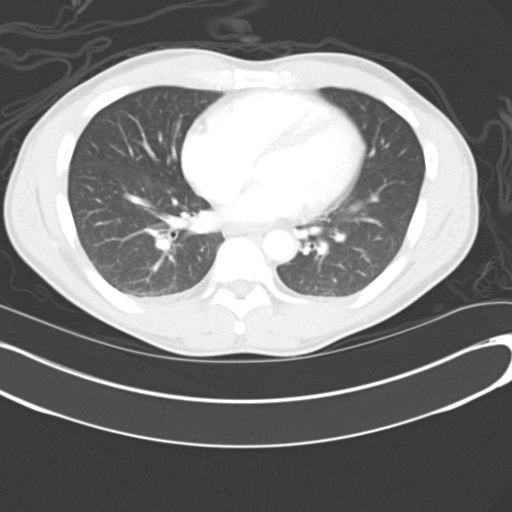
[im 30/58  lung]
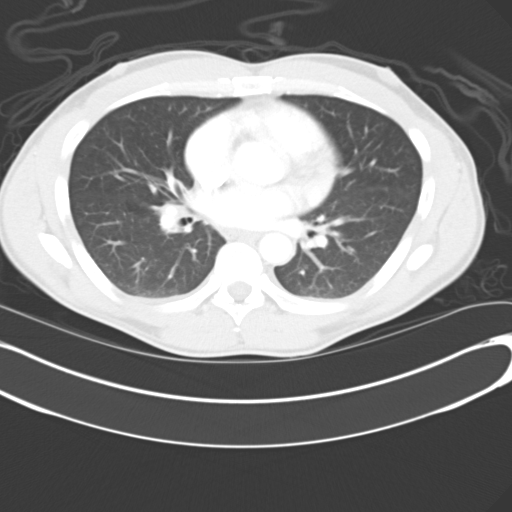
[im 34/58  lung]
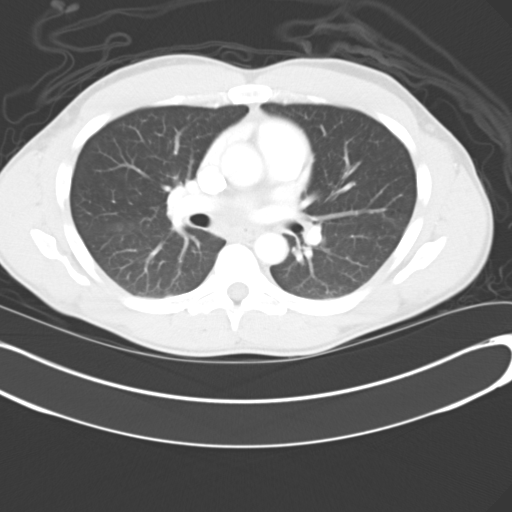
[im 39/58  mediastinal]
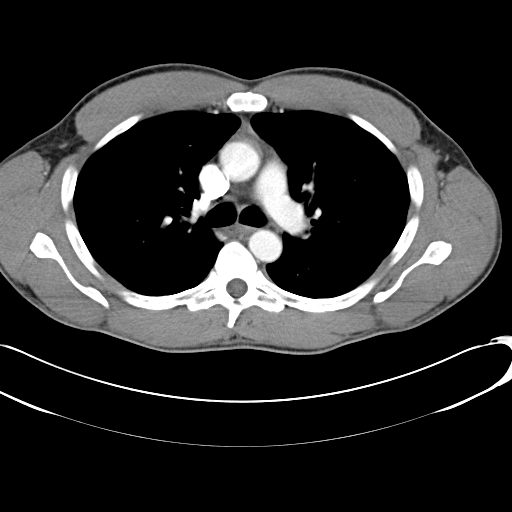
[im 39/58  lung]
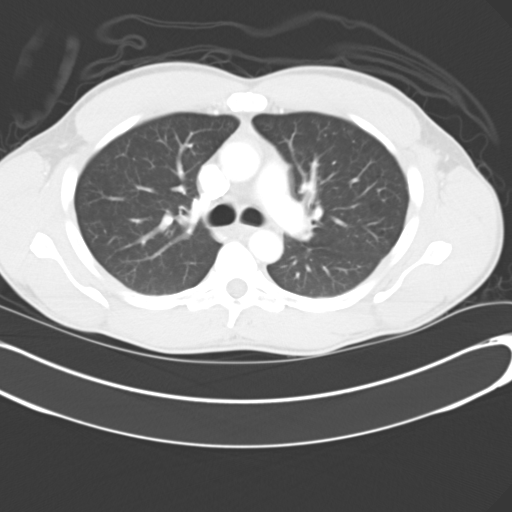
[im 43/58  lung]
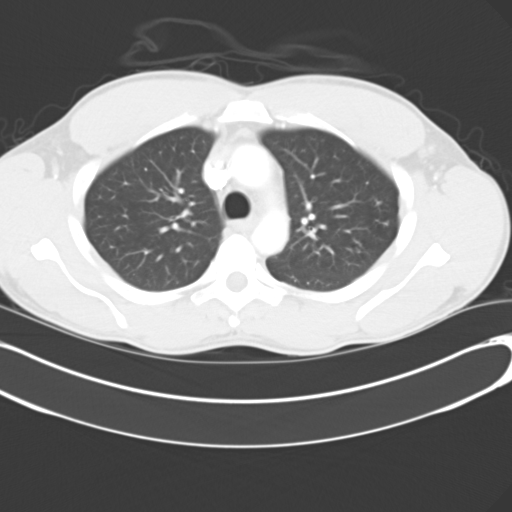
[im 47/58  lung]
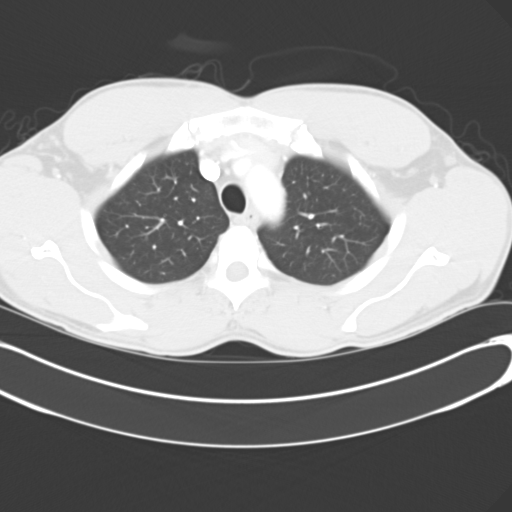
[im 51/58  lung]
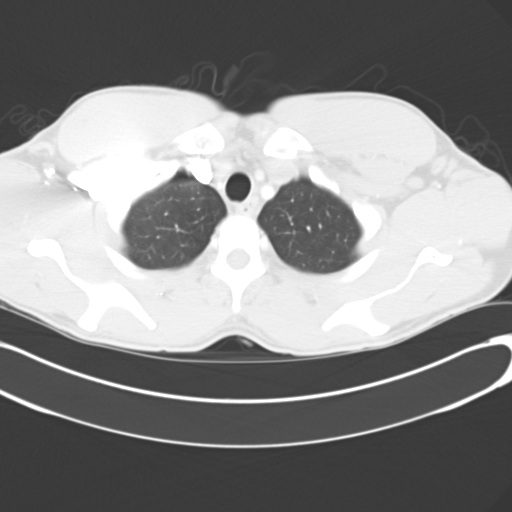
[im 55/58  mediastinal]
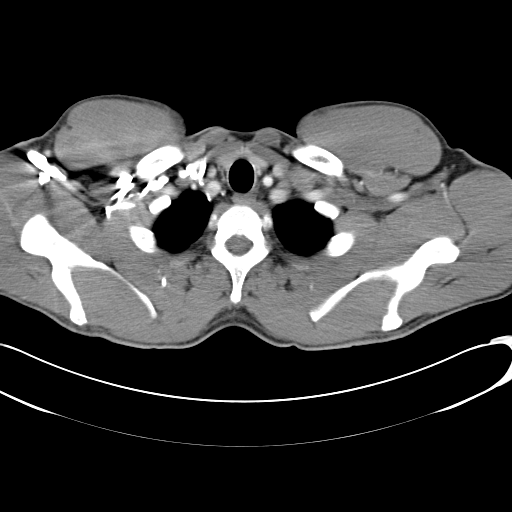
[im 55/58  lung]
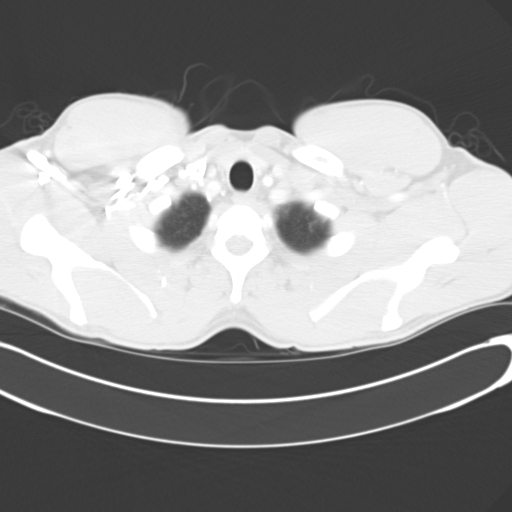

[Series 5: coronals · coronal · 0.62mm/px · 3 of 70 slices shown]
[im 14/70  lung]
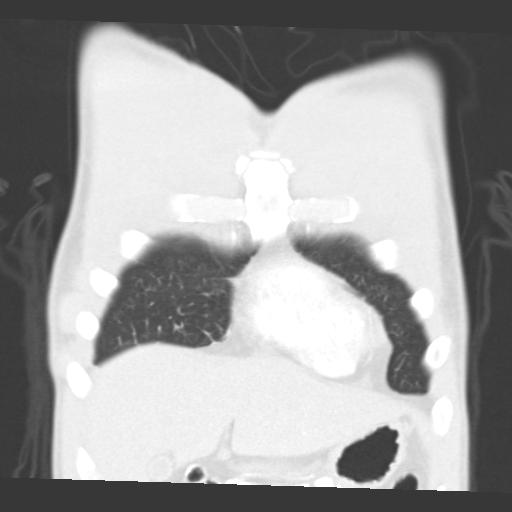
[im 28/70  lung]
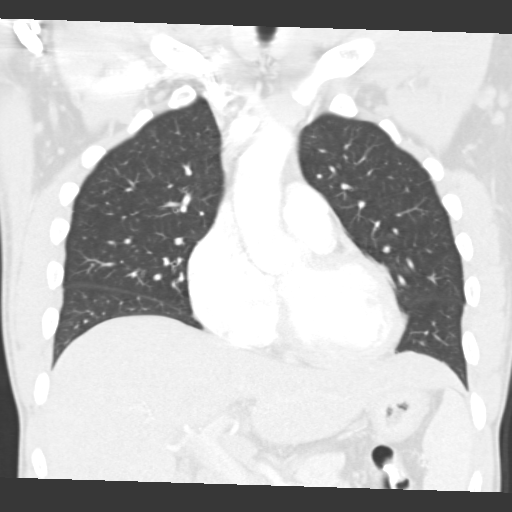
[im 42/70  lung]
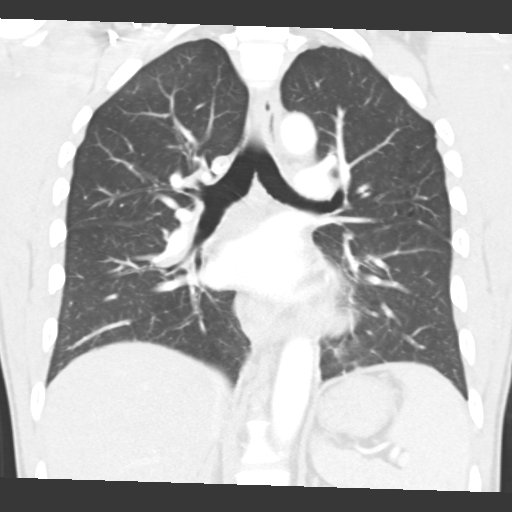

[16 of 36 positions shown; findings below may reference images not displayed]

FINDINGS: No evidence of mediastinal or hilar masses.  No
lymphadenopathy is seen elsewhere within the thorax.  No evidence
of pleural or pericardial effusion.  No evidence of pulmonary
infiltrate or mass.  No central endobronchial lesion identified.
No suspicious bone lesions are identified.  Visualized upper
abdominal structures are unremarkable.
IMPRESSION: Negative.  No active disease or other significant abnormality.

## 2012-07-10 ENCOUNTER — Encounter: Payer: Self-pay | Admitting: Internal Medicine

## 2012-07-20 ENCOUNTER — Ambulatory Visit: Payer: Self-pay | Admitting: Internal Medicine

## 2012-08-02 ENCOUNTER — Encounter: Payer: Self-pay | Admitting: Licensed Clinical Social Worker

## 2012-08-02 NOTE — Progress Notes (Signed)
Bruce [name] walked in to [facility] today requesting to see CSW.  Upon meeting with Bruce [name], pt states he needs a letter to get bus passes from [facility] to pay his bills.  Pt inquired if CSW had bus passes, CSW informed Bruce [name] CSW has bus passes for [name] patient appt's.  Pt requesting letter but unsure what should be stated in letter.  CSW attempted to assist pt best know way, provided pt with letter.

## 2012-08-30 ENCOUNTER — Encounter: Payer: Self-pay | Admitting: *Deleted

## 2012-11-09 ENCOUNTER — Encounter (HOSPITAL_COMMUNITY): Payer: Self-pay

## 2012-11-09 ENCOUNTER — Emergency Department (HOSPITAL_COMMUNITY)
Admission: EM | Admit: 2012-11-09 | Discharge: 2012-11-09 | Disposition: A | Discharge status: Home / Self Care | Payer: Self-pay | Attending: Emergency Medicine | Admitting: Emergency Medicine

## 2012-11-09 DIAGNOSIS — S81009A Unspecified open wound, unspecified knee, initial encounter: Secondary | ICD-10-CM | POA: Insufficient documentation

## 2012-11-09 DIAGNOSIS — Z87448 Personal history of other diseases of urinary system: Secondary | ICD-10-CM | POA: Insufficient documentation

## 2012-11-09 DIAGNOSIS — S81809A Unspecified open wound, unspecified lower leg, initial encounter: Secondary | ICD-10-CM | POA: Insufficient documentation

## 2012-11-09 DIAGNOSIS — W540XXA Bitten by dog, initial encounter: Secondary | ICD-10-CM | POA: Insufficient documentation

## 2012-11-09 DIAGNOSIS — Y939 Activity, unspecified: Secondary | ICD-10-CM | POA: Insufficient documentation

## 2012-11-09 DIAGNOSIS — Y929 Unspecified place or not applicable: Secondary | ICD-10-CM | POA: Insufficient documentation

## 2012-11-09 DIAGNOSIS — Z8619 Personal history of other infectious and parasitic diseases: Secondary | ICD-10-CM | POA: Insufficient documentation

## 2012-11-09 MED ORDER — HYDROCODONE-ACETAMINOPHEN 5-325 MG PO TABS: 2.0000 | ORAL_TABLET | ORAL | Status: DC | PRN

## 2012-11-09 MED ORDER — SULFAMETHOXAZOLE-TRIMETHOPRIM 800-160 MG PO TABS: 1.0000 | ORAL_TABLET | Freq: Two times a day (BID) | ORAL | Status: DC

## 2012-11-09 NOTE — ED Notes (Signed)
Area is scabbed, red and warm to touch. Has been using Advil for pain relief but the pain is getting worse.

## 2012-11-09 NOTE — ED Provider Notes (Signed)
History  This chart was scribed for non-physician practitioner working with [name], MD by Ardeen Jourdain, ED Scribe. This patient was seen in room TR07C/TR07C and the patient's care was started at 1905.  CSN: [id number]  Arrival date & time 11/09/12  1752   First MD Initiated Contact with Patient 11/09/12 1905      Chief Complaint  Patient presents with  . Animal Bite     The history is provided by the patient. No language interpreter was used.    Bruce [name] is a 42 y.o. male who presents to the Emergency Department complaining of an animal bite to his interior left upper leg with associated pain to the area. He states that last Thursday he was bitten by a family dog. He states he has a scab on the area and that the area is very tender, moderate in severity.  Describes it as a dull ache. He states the dog's vaccines are up to date. He reports taking Advil for the pain with no relief.    Past Medical History  Diagnosis Date  . Scrotal mass   . HCV antibody positive   . Tick borne fever   . Hepatitis C infection   . Dental caries   . Iritis   . Uveitis     Past Surgical History  Procedure Date  . Finger surgery     infection, middle left    History reviewed. No pertinent family history.  History  Substance Use Topics  . Smoking status: Passive Smoke Exposure - Never Smoker    Types: Cigarettes  . Smokeless tobacco: Never Used  . Alcohol Use: No      Review of Systems  Constitutional: Negative for fever and chills.  Skin: Positive for wound.  All other systems reviewed and are negative.    Allergies  Penicillins  Home Medications   No current outpatient prescriptions on file.  Triage Vitals: BP 121/83  Pulse 78  Temp 98 F (36.7 C)  Resp 18  Ht 5\' 7"  (1.702 m)  Wt 165 lb (74.844 kg)  BMI 25.84 kg/m2  SpO2 100%  Physical Exam  Nursing note and vitals reviewed. Constitutional: He is oriented to person, place, and time. He appears  well-developed and well-nourished. No distress.  HENT:  Head: Normocephalic and atraumatic.  Eyes: EOM are normal. Pupils are equal, round, and reactive to light.  Neck: Normal range of motion. Neck supple. No tracheal deviation present.  Cardiovascular: Normal rate.   Pulmonary/Chest: Effort normal. No respiratory distress.  Abdominal: Soft. He exhibits no distension.  Musculoskeletal: Normal range of motion. He exhibits no edema.  Neurological: He is alert and oriented to person, place, and time.  Skin: Skin is warm and dry.       Bite wound to posterior left knee, does not appear to be infected at this time, appears to be healing well however TTP  Psychiatric: He has a normal mood and affect. His behavior is normal.    ED Course  Procedures (including critical care time)  DIAGNOSTIC STUDIES: Oxygen Saturation is 100% on room air, normal by my interpretation.    COORDINATION OF CARE:  7:50 PM: Discussed treatment plan which includes pain medication and antibiotics with pt at bedside and pt agreed to plan.    Labs Reviewed - No data to display No results found.   1. Dog bite       MDM  42 year old male with dog bite. Will treat with Bactrim, and pain  medicine for sleep. Patient understands and agrees with the plan. He is stable and ready for discharge.     I personally performed the services described in this documentation, which was scribed in my presence. The recorded information has been reviewed and is accurate.     Roxy Horseman, PA-C 11/09/12 [phone]

## 2012-11-09 NOTE — ED Notes (Signed)
Pt. Was bitten last Thursday by his pitbull.,  In lt. Upper leg behind his knee cap.  Area is red an scabbed, very sore to touch.   Dogs vaccines are UTD.

## 2012-11-10 NOTE — ED Provider Notes (Signed)
Medical screening examination/treatment/procedure(s) were performed by non-physician practitioner and as supervising physician I was immediately available for consultation/collaboration.    Calistro Rauf R Laquiesha Piacente, MD 11/10/12 0029 

## 2012-11-16 ENCOUNTER — Ambulatory Visit: Payer: Self-pay | Admitting: Internal Medicine

## 2012-12-22 ENCOUNTER — Emergency Department (HOSPITAL_COMMUNITY): Payer: No Typology Code available for payment source

## 2012-12-22 ENCOUNTER — Encounter (HOSPITAL_COMMUNITY): Payer: Self-pay | Admitting: *Deleted

## 2012-12-22 ENCOUNTER — Emergency Department (HOSPITAL_COMMUNITY)
Admission: EM | Admit: 2012-12-22 | Discharge: 2012-12-23 | Disposition: A | Discharge status: Home / Self Care | Payer: No Typology Code available for payment source | Attending: Emergency Medicine | Admitting: Emergency Medicine

## 2012-12-22 DIAGNOSIS — Z23 Encounter for immunization: Secondary | ICD-10-CM | POA: Insufficient documentation

## 2012-12-22 DIAGNOSIS — Z8619 Personal history of other infectious and parasitic diseases: Secondary | ICD-10-CM | POA: Insufficient documentation

## 2012-12-22 DIAGNOSIS — Z87448 Personal history of other diseases of urinary system: Secondary | ICD-10-CM | POA: Insufficient documentation

## 2012-12-22 DIAGNOSIS — W1809XA Striking against other object with subsequent fall, initial encounter: Secondary | ICD-10-CM | POA: Insufficient documentation

## 2012-12-22 DIAGNOSIS — Y929 Unspecified place or not applicable: Secondary | ICD-10-CM | POA: Insufficient documentation

## 2012-12-22 DIAGNOSIS — Y939 Activity, unspecified: Secondary | ICD-10-CM | POA: Insufficient documentation

## 2012-12-22 DIAGNOSIS — S02609A Fracture of mandible, unspecified, initial encounter for closed fracture: Secondary | ICD-10-CM

## 2012-12-22 DIAGNOSIS — S0180XA Unspecified open wound of other part of head, initial encounter: Secondary | ICD-10-CM | POA: Insufficient documentation

## 2012-12-22 DIAGNOSIS — S02650A Fracture of angle of mandible, unspecified side, initial encounter for closed fracture: Secondary | ICD-10-CM | POA: Insufficient documentation

## 2012-12-22 MED ORDER — HYDROMORPHONE HCL PF 1 MG/ML IJ SOLN
1.0000 mg | Freq: Once | INTRAMUSCULAR | Status: AC
  Administered 2012-12-22: 1 mg via INTRAMUSCULAR
  Filled 2012-12-22: qty 1

## 2012-12-22 NOTE — ED Notes (Signed)
c-t of facial bones per order of dr Oletta Lamas

## 2012-12-22 NOTE — ED Notes (Signed)
The pt is c/o mouth and jaw pain.  He fell face forward and he is unable to open his mouth wide and he has difficulty chewing.  He has only had liquids since the fall.  hehas a cut to his chin also.  He does not know id his teeth are loose.  He is also c/o lt thumb pain

## 2012-12-22 NOTE — ED Notes (Signed)
The pt fell 2 days ago

## 2012-12-23 MED ORDER — HYDROCODONE-ACETAMINOPHEN 7.5-325 MG/15ML PO SOLN: ORAL | Status: DC

## 2012-12-23 MED ORDER — SODIUM CHLORIDE 0.9 % IV SOLN
1000.0000 mL | INTRAVENOUS | Status: DC
  Administered 2012-12-23: 1000 mL via INTRAVENOUS

## 2012-12-23 MED ORDER — CLINDAMYCIN HCL 150 MG PO CAPS: 300.0000 mg | ORAL_CAPSULE | Freq: Four times a day (QID) | ORAL | Status: DC

## 2012-12-23 MED ORDER — SODIUM CHLORIDE 0.9 % IV SOLN
1000.0000 mL | Freq: Once | INTRAVENOUS | Status: AC
  Administered 2012-12-23: 1000 mL via INTRAVENOUS

## 2012-12-23 MED ORDER — TETANUS-DIPHTH-ACELL PERTUSSIS 5-2.5-18.5 LF-MCG/0.5 IM SUSP
0.5000 mL | Freq: Once | INTRAMUSCULAR | Status: AC
  Administered 2012-12-23: 0.5 mL via INTRAMUSCULAR
  Filled 2012-12-23 (×2): qty 0.5

## 2012-12-23 MED ORDER — HYDROMORPHONE HCL PF 1 MG/ML IJ SOLN
1.0000 mg | Freq: Once | INTRAMUSCULAR | Status: AC
  Administered 2012-12-23: 1 mg via INTRAVENOUS
  Filled 2012-12-23 (×2): qty 1

## 2012-12-23 MED ORDER — HYDROCODONE-ACETAMINOPHEN 7.5-500 MG/15ML PO SOLN: 15.0000 mL | Freq: Four times a day (QID) | ORAL | Status: DC | PRN

## 2012-12-23 MED ORDER — DIPHENHYDRAMINE HCL 50 MG/ML IJ SOLN
25.0000 mg | Freq: Once | INTRAMUSCULAR | Status: AC
  Administered 2012-12-23: 25 mg via INTRAVENOUS
  Filled 2012-12-23: qty 1

## 2012-12-23 NOTE — ED Provider Notes (Signed)
History     CSN: [id number]  Arrival date & time 12/22/12  2156   First MD Initiated Contact with Patient 12/22/12 2312      Chief Complaint  Patient presents with  . Jaw Pain     The history is provided by the patient.   patient reports he slipped and fell 2 days ago on the ice and hit his chin on the railing of the steps.  He's had trismus and malocclusion since.  He states he can only drink fluids and soft foods.  He reports significant pain.  He also had 2 lacerations overlying his chin at that time which she did not seek medical care for.  He denies fevers and chills.  No other injury associated with the Floxin for mild left thumb pain.  His pain in his left thumb is worse with movement and palpation.  His symptoms are mild to moderate in severity.  Past Medical History  Diagnosis Date  . Scrotal mass   . HCV antibody positive   . Tick borne fever   . Hepatitis C infection   . Dental caries   . Iritis   . Uveitis     Past Surgical History  Procedure Laterality Date  . Finger surgery      infection, middle left    No family history on file.  History  Substance Use Topics  . Smoking status: Passive Smoke Exposure - Never Smoker    Types: Cigarettes  . Smokeless tobacco: Never Used  . Alcohol Use: No      Review of Systems  All other systems reviewed and are negative.    Allergies  Penicillins  Home Medications   Current Outpatient Rx  Name  Route  Sig  Dispense  Refill  . ibuprofen (ADVIL,MOTRIN) 200 MG tablet   Oral   Take 400 mg by mouth every 6 (six) hours as needed for pain. For pain         . clindamycin (CLEOCIN) 150 MG capsule   Oral   Take 2 capsules (300 mg total) by mouth 4 (four) times daily.   56 capsule   0   . HYDROcodone-acetaminophen (LORTAB) 7.5-500 MG/15ML solution   Oral   Take 15 mLs by mouth every 6 (six) hours as needed for pain.   240 mL   0     BP 128/79  Pulse 64  Temp(Src) 98.2 F (36.8 C) (Oral)  Resp 19   SpO2 100%  Physical Exam  Nursing note and vitals reviewed. Constitutional: He is oriented to person, place, and time. He appears well-developed and well-nourished.  HENT:  Head: Normocephalic and atraumatic.  Laceration x2 overlying mental protuberance.  These were cleaned at the bedside significantly and irrigated.  Patient with trismus and malocclusion.  Poor dentition at baseline.  Small intraoral laceration next is left lateral lower incisor.  No loose dentition.  Eyes: EOM are normal.  Neck: Normal range of motion.  Cardiovascular: Normal rate, regular rhythm, normal heart sounds and intact distal pulses.   Pulmonary/Chest: Effort normal and breath sounds normal. No respiratory distress.  Abdominal: Soft. He exhibits no distension. There is no tenderness.  Musculoskeletal: Normal range of motion.  Neurological: He is alert and oriented to person, place, and time.  Skin: Skin is warm and dry.  Psychiatric: He has a normal mood and affect. Judgment normal.    ED Course  Procedures (including critical care time)  Labs Reviewed - No data to display Dg Hand  Complete Left  12/22/2012  *RADIOLOGY REPORT*  Clinical Data: Fall.  Pain in first and second metacarpal  LEFT HAND - COMPLETE 3+ VIEW  Comparison: None  Findings: There is no evidence of fracture or dislocation.  There is no evidence of arthropathy or other focal bone abnormality. Soft tissues are unremarkable.  IMPRESSION: Negative exam.   Original Report Authenticated By: Signa Kell, M.D.    Ct Maxillofacial Wo Cm  12/22/2012  *RADIOLOGY REPORT*  Clinical Data: Jaw pain.  Fall  CT MAXILLOFACIAL WITHOUT CONTRAST  Technique:  Multidetector CT imaging of the maxillofacial structures was performed. Multiplanar CT image reconstructions were also generated.  Comparison: None.  Findings: Nondisplaced fracture extends through the angle of mandible, image number 15/series 5.  There is overlying soft tissue swelling.  Chronic deformity  (involving the lateral wall of the right maxillary sinus is noted.  The remaining paranasal sinuses appear normal.  The orbits are intact.  Visualized intracranial contents appear normal.  IMPRESSION:  1.  Nondisplaced fracture extends through the angle of mandible.   Original Report Authenticated By: Signa Kell, M.D.    I personally reviewed the imaging tests through PACS system I reviewed available ER/hospitalization records through the EMR   1. Mandible fracture       MDM  I spoke with the on-call maxillofacial surgeon Dr. Annalee Genta who requests home with antibiotics and pain control.  Soft diet and followup in his office on Monday.  The patient understands the importance of maxillofacial followup.  Home with prescription for clindamycin.       Lyanne Co, MD 12/23/12 [phone]

## 2012-12-23 NOTE — ED Provider Notes (Signed)
12:34 PM Patient seen in the ED yesterday by Dr. Patria Mane for Mandible fracture. Returned b/c Pharmacy could not fill LORTAB elixir as it is no longer made. Changed RX to HYcet.   Arthor Captain, PA-C 12/23/12 1235

## 2012-12-24 NOTE — ED Provider Notes (Signed)
Medical screening examination/treatment/procedure(s) were performed by non-physician practitioner and as supervising physician I was immediately available for consultation/collaboration.  Gilda Crease, MD 12/24/12 [phone]

## 2012-12-25 ENCOUNTER — Telehealth: Payer: Self-pay | Admitting: *Deleted

## 2012-12-25 ENCOUNTER — Encounter: Payer: Self-pay | Admitting: Internal Medicine

## 2012-12-25 ENCOUNTER — Other Ambulatory Visit: Payer: Self-pay | Admitting: Internal Medicine

## 2012-12-25 ENCOUNTER — Ambulatory Visit (INDEPENDENT_AMBULATORY_CARE_PROVIDER_SITE_OTHER): Payer: No Typology Code available for payment source | Admitting: Internal Medicine

## 2012-12-25 VITALS — BP 107/74 | HR 80 | Temp 98.2°F | Ht 67.0 in | Wt 148.5 lb

## 2012-12-25 DIAGNOSIS — S02609A Fracture of mandible, unspecified, initial encounter for closed fracture: Secondary | ICD-10-CM | POA: Insufficient documentation

## 2012-12-25 DIAGNOSIS — Z23 Encounter for immunization: Secondary | ICD-10-CM | POA: Insufficient documentation

## 2012-12-25 DIAGNOSIS — W010XXA Fall on same level from slipping, tripping and stumbling without subsequent striking against object, initial encounter: Secondary | ICD-10-CM

## 2012-12-25 MED ORDER — HYDROCODONE-ACETAMINOPHEN 7.5-750 MG PO TABS: 1.0000 | ORAL_TABLET | Freq: Three times a day (TID) | ORAL | Status: DC | PRN

## 2012-12-25 MED ORDER — HYDROCODONE-ACETAMINOPHEN 5-325MG PREPACK (~~LOC~~: 1.0000 | ORAL_TABLET | Freq: Three times a day (TID) | ORAL | Status: DC | PRN

## 2012-12-25 NOTE — Assessment & Plan Note (Signed)
-  Patient refused  flu shot today, will postpone 

## 2012-12-25 NOTE — Assessment & Plan Note (Signed)
Patient has a nondisplaced mandibular fracture, as evidenced by CT scan. Patient is still has moderate pain and could not eat normally. He missed his appointment with a maxillofacial surgeon due to financial difficulties. His skin lacerations over his chin seem to be healing well. There is no signs of infection. He has [employer].   - Will continue to complete the course of clindamycin - Will treat him symptomatically with Vicodin for pain - Will give him referral to ENT.

## 2012-12-25 NOTE — Patient Instructions (Signed)
1. Please continue to complete your clindamycin and take Vicodin for pain when you need it.  2. Please take all medications as prescribed.  3. If you have worsening of your symptoms or new symptoms arise, please call the clinic ([phone]), or go to the ER immediately if symptoms are severe.  Mandibular Fracture A mandibular fracture is a break in the jawbone. CAUSES  The most common cause of mandibular fracture is a direct blow (trauma) to the jaw. This could happen from:  A car crash.  Physical violence.  A fall from a high place. SYMPTOMS   Pain.  Swelling.  Difficulty and pain when closing the mouth.  Feeling that the teeth are not aligned properly when closing the mouth (malocclusion).  Difficulty speaking.  Difficulty swallowing. DIAGNOSIS  Your caregiver will take your history and perform a physical exam. He or she may also order imaging tests, such as X-rays or a computed tomography (CT) scan, to confirm your diagnosis. TREATMENT  Surgery is often needed to put the jaw back in the right position. Wires are usually placed around the teeth to hold the jaw in place while it heals. Treatment may also include pain medicine, ice, and a soft or liquid diet. HOME CARE INSTRUCTIONS   Put ice on the injured area.  Put ice in a plastic bag.  Place a towel between your skin and the bag.  Leave the ice on for 15 to 20 minutes, 3 to 4 times a day for the first 2 days.  Only take over-the-counter or prescription medicines for pain, discomfort, or fever as directed by your caregiver.  Eat a well-balanced, high-protein soft or liquid diet as directed by your caregiver.  If your jaws are wired, follow your caregiver's instructions for wired jaw care.  Sleep on your back to avoid putting pressure on your jaw.  Avoid exercising to the point that you become short of breath. SEEK MEDICAL CARE IF:   You have a severe headache or numbness in your face.  You have severe jaw pain  that is not relieved with medicine.  Your jaw wires become loose.  You have uncontrollable nausea or anxiety.  Your swelling or redness gets worse. SEEK IMMEDIATE MEDICAL CARE IF:  You have a fever.  You have difficulty breathing.  You feel like your airway is tightening.  You cannot swallow your saliva.  You make a high-pitched whistling sound when you breathe (wheezing). MAKE SURE YOU:   Understand these instructions.  Will watch your condition.  Will get help right away if you are not doing well or get worse. Document Released: 10/24/2005 Document Revised: 01/16/2012 Document Reviewed: 11/09/2011 [facility] Patient Information 2013 [place], [place].

## 2012-12-25 NOTE — Telephone Encounter (Signed)
Pharmacy calls and states script needs to be changed, pt did pick up hydrocodone liquid from ED visit so he has enough for 2 days but if you can change your script to liquid and the 7.5/ 325 because the 7.5/750 is not made anymore that would be good

## 2012-12-25 NOTE — Progress Notes (Signed)
[name], male   DOB: [birth year], 42 y.o.   MRN: [id number] Subjective:   [name] male   DOB: [birth year] 42 y.o.   MRN: [id number]  CC:  Jaw pain HPI: [name] is a 42 y.o. man with past medical history as outlined below, who presents for a followup visit for his recently visit to ED on 12/22/12.  Patient reports that he slipped and fell on 11/19/12 when he was shoveling snow at home. He hit his chin on the railing of the steps. He started having pain over his jaw and left hand. He had trismus and malocclusion since and could not chew food normally. He also had two small lacerations over his chin at that time. He visited ED on 11/21/12.  His chin skin laceration was cleaned and irrigated by ED physician. CT Maxillofacial scan showed nondisplaced fracture extends through the angle of mandible. He was discharged on pain medication and clindamycin for 7 days. He was given referral by ED physician to maxillofacial surgeon, but he missed appointment due to financial difficulties.   Today he reports that he has been taking his clindamycin and pain medications. He still has moderate pain over his mandible. He does not have fever or chills. He still cannot open his mouth normally and has difficulty in chewing food. His left hand pain is minimal and does not bother him any more.   Denies fever, chills, fatigue, headaches, cough, chest pain, SOB,  abdominal pain,diarrhea, constipation, dysuria, urgency, frequency, hematuria.      Past Medical History  Diagnosis Date  . Scrotal mass   . HCV antibody positive   . Tick borne fever   . Hepatitis C infection   . Dental caries   . Iritis   . Uveitis    Current Outpatient Prescriptions  Medication Sig Dispense Refill  . clindamycin (CLEOCIN) 150 MG capsule Take 2 capsules (300 mg total) by mouth 4 (four) times daily.  56 capsule  0  . HYDROcodone-acetaminophen (HYCET) 7.5-325 mg/15 ml solution Take 15 ml ever 4-6 hours as  need for pain  120 mL  0  . HYDROcodone-acetaminophen (VICODIN ES) 7.5-750 MG per tablet Take 1 tablet by mouth every 8 (eight) hours as needed for pain.  30 tablet  0  . ibuprofen (ADVIL,MOTRIN) 200 MG tablet Take 400 mg by mouth every 6 (six) hours as needed for pain. For pain       No current facility-administered medications for this visit.   No family history on file. History   Social History  . Marital Status: Single    Spouse Name: N/A    Number of Children: 0  . Years of Education: N/A   Occupational History  .     Social History Main Topics  . Smoking status: Passive Smoke Exposure - Never Smoker    Types: Cigarettes  . Smokeless tobacco: Never Used  . Alcohol Use: No  . Drug Use: No  . Sexually Active: None   Other Topics Concern  . None   Social History Narrative  . None    Review of Systems: as per HPI  Objective:  Physical Exam: Filed Vitals:   12/25/12 1534  BP: 107/74  Pulse: 80  Temp: 98.2 F (36.8 C)  TempSrc: Oral  Height: 5\' 7"  (1.702 m)  Weight: 148 lb 8 oz (67.359 kg)  SpO2: 98%   Constitutional: Vital signs reviewed.  Patient is a well-developed and well-nourished, in no acute distress and  cooperative with exam.   HEENT:  Head: Normocephalic Ear: TM normal bilaterally Face: There are two small lacerations over his chin, proximately 2 X 0.4 and 1 X 0.2 cm in size. The lacerations are healing well without signs of infection. Patient has trismus and malocclusion.  Poor dentition at baseline.  there is tenderness over his mandible. There is no decreased sensation. Eyes: PERRL, EOMI, conjunctivae normal, No scleral icterus.  Neck: Supple, Trachea midline normal ROM, No JVD, mass, thyromegaly, or carotid bruit present. No lymph node enlargement. Cardiovascular: RRR, S1 normal, S2 normal, no MRG, pulses symmetric and intact bilaterally Pulmonary/Chest: CTAB, no wheezes, rales, or rhonchi Abdominal: Soft. Non-tender, non-distended, bowel sounds  are normal, no masses, organomegaly, or guarding present.  Musculoskeletal: No joint deformities, erythema, or stiffness, ROM full and non-tender Hematology: no cervical, inginal, or axillary adenopathy.  Neurological: A&O x3, Strength is normal and symmetric bilaterally, cranial nerve II-XII are grossly intact, no focal motor deficit, sensory intact to light touch bilaterally.  Skin: No rash, cyanosis, or clubbing.  Psychiatric: Normal mood and affect. speech and behavior is normal. Judgment and thought content normal.   Assessment & Plan:

## 2012-12-26 ENCOUNTER — Other Ambulatory Visit: Payer: Self-pay | Admitting: Internal Medicine

## 2012-12-26 DIAGNOSIS — S02609A Fracture of mandible, unspecified, initial encounter for closed fracture: Secondary | ICD-10-CM

## 2012-12-26 MED ORDER — HYDROCODONE-ACETAMINOPHEN 5-325 MG PO TABS: 1.0000 | ORAL_TABLET | Freq: Three times a day (TID) | ORAL | Status: DC | PRN

## 2013-01-01 ENCOUNTER — Ambulatory Visit: Payer: No Typology Code available for payment source

## 2013-01-02 ENCOUNTER — Other Ambulatory Visit: Payer: Self-pay | Admitting: *Deleted

## 2013-01-02 ENCOUNTER — Other Ambulatory Visit: Payer: Self-pay | Admitting: Internal Medicine

## 2013-01-02 MED ORDER — HYDROCODONE-ACETAMINOPHEN 5-325 MG PO TABS: 1.0000 | ORAL_TABLET | Freq: Three times a day (TID) | ORAL | Status: DC | PRN

## 2013-01-02 NOTE — Telephone Encounter (Signed)
I sent a prescription to his pharmacy as phone-in.  Lorretta Harp, MD PGY2, Internal Medicine Teaching Service Pager: [phone]

## 2013-01-02 NOTE — Telephone Encounter (Signed)
According to pharmacy pt should be out of med fri 2/28

## 2013-01-02 NOTE — Telephone Encounter (Signed)
I sent a prescription to his pharmacy as Phone-in.   Lorretta Harp, MD PGY2, Internal Medicine Teaching Service Pager: [phone]

## 2013-01-02 NOTE — Telephone Encounter (Signed)
Called to pharm 

## 2013-01-02 NOTE — Telephone Encounter (Signed)
Pt came to lobby

## 2013-01-16 ENCOUNTER — Other Ambulatory Visit: Payer: Self-pay | Admitting: Internal Medicine

## 2013-01-16 ENCOUNTER — Other Ambulatory Visit: Payer: Self-pay | Admitting: *Deleted

## 2013-01-16 MED ORDER — HYDROCODONE-ACETAMINOPHEN 5-325 MG PO TABS: 1.0000 | ORAL_TABLET | Freq: Three times a day (TID) | ORAL | Status: DC | PRN

## 2013-01-16 NOTE — Telephone Encounter (Signed)
Rx called in 

## 2013-01-24 ENCOUNTER — Other Ambulatory Visit: Payer: Self-pay | Admitting: *Deleted

## 2013-01-24 NOTE — Telephone Encounter (Signed)
Pt calls for progress update on referral for having jaw set, he states he will come by here today or tomorrow due to being out of minutes on his phone

## 2013-01-24 NOTE — Telephone Encounter (Signed)
Would you consider changing #, pt is calling frequently

## 2013-01-25 MED ORDER — HYDROCODONE-ACETAMINOPHEN 5-325 MG PO TABS: 1.0000 | ORAL_TABLET | Freq: Three times a day (TID) | ORAL | Status: DC | PRN

## 2013-01-25 NOTE — Telephone Encounter (Signed)
Rx called in to pharmacy. Pt aware. Stanton Kidney Seraj Dunnam RN 01/25/13 11:30AM

## 2013-02-04 ENCOUNTER — Other Ambulatory Visit: Payer: Self-pay | Admitting: *Deleted

## 2013-02-04 DIAGNOSIS — S02609D Fracture of mandible, unspecified, subsequent encounter for fracture with routine healing: Secondary | ICD-10-CM

## 2013-02-05 ENCOUNTER — Encounter (HOSPITAL_COMMUNITY): Payer: Self-pay | Admitting: Emergency Medicine

## 2013-02-05 ENCOUNTER — Other Ambulatory Visit: Payer: Self-pay | Admitting: Internal Medicine

## 2013-02-05 ENCOUNTER — Emergency Department (HOSPITAL_COMMUNITY)
Admission: EM | Admit: 2013-02-05 | Discharge: 2013-02-05 | Disposition: A | Discharge status: Home / Self Care | Payer: Self-pay | Attending: Emergency Medicine | Admitting: Emergency Medicine

## 2013-02-05 ENCOUNTER — Emergency Department (HOSPITAL_COMMUNITY): Payer: Self-pay

## 2013-02-05 DIAGNOSIS — Z87448 Personal history of other diseases of urinary system: Secondary | ICD-10-CM | POA: Insufficient documentation

## 2013-02-05 DIAGNOSIS — M795 Residual foreign body in soft tissue: Secondary | ICD-10-CM | POA: Insufficient documentation

## 2013-02-05 DIAGNOSIS — M79644 Pain in right finger(s): Secondary | ICD-10-CM

## 2013-02-05 DIAGNOSIS — Z8719 Personal history of other diseases of the digestive system: Secondary | ICD-10-CM | POA: Insufficient documentation

## 2013-02-05 DIAGNOSIS — Z8619 Personal history of other infectious and parasitic diseases: Secondary | ICD-10-CM | POA: Insufficient documentation

## 2013-02-05 DIAGNOSIS — IMO0002 Reserved for concepts with insufficient information to code with codable children: Secondary | ICD-10-CM

## 2013-02-05 DIAGNOSIS — Z8669 Personal history of other diseases of the nervous system and sense organs: Secondary | ICD-10-CM | POA: Insufficient documentation

## 2013-02-05 MED ORDER — CEPHALEXIN 500 MG PO CAPS: 500.0000 mg | ORAL_CAPSULE | Freq: Four times a day (QID) | ORAL | Status: DC

## 2013-02-05 MED ORDER — TRAMADOL HCL 50 MG PO TABS: 50.0000 mg | ORAL_TABLET | Freq: Four times a day (QID) | ORAL | Status: DC | PRN

## 2013-02-05 MED ORDER — HYDROCODONE-ACETAMINOPHEN 5-325 MG PO TABS
2.0000 | ORAL_TABLET | Freq: Once | ORAL | Status: AC
  Administered 2013-02-05: 2 via ORAL
  Filled 2013-02-05: qty 2

## 2013-02-05 NOTE — ED Provider Notes (Signed)
History     CSN: [id number]  Arrival date & time 02/05/13  4098   First MD Initiated Contact with Patient 02/05/13 1007      Chief Complaint  Patient presents with  . Finger Injury    (Consider location/radiation/quality/duration/timing/severity/associated sxs/prior treatment) HPI Comments: Patient reports that while mowing his lawn yesterday some debris flew up and hit him on the bridge of his nose and got into his right finger.  He reports that he is having pain of the right index finger just lateral to the nail and thinks that there is a splinter there.  He has not attempted to remove the splinter.  No treatment prior to arrival.  He feels that the area around the splinter has become swollen.  He denies fever or chills.  Denies numbness or tingling.  His tetanus is UTD.  No foreign body sensation or pain in the eyes.    The history is provided by the patient.    Past Medical History  Diagnosis Date  . Scrotal mass   . HCV antibody positive   . Tick borne fever   . Hepatitis C infection   . Dental caries   . Iritis   . Uveitis     Past Surgical History  Procedure Laterality Date  . Finger surgery      infection, middle left    History reviewed. No pertinent family history.  History  Substance Use Topics  . Smoking status: Passive Smoke Exposure - Never Smoker    Types: Cigarettes  . Smokeless tobacco: Never Used  . Alcohol Use: No      Review of Systems  Musculoskeletal:       Pain of the right index finger  Skin: Positive for wound. Negative for color change.       Abrasion of the bridge of the nose  Neurological: Negative for numbness.    Allergies  Penicillins  Home Medications   Current Outpatient Rx  Name  Route  Sig  Dispense  Refill  . HYDROcodone-acetaminophen (NORCO/VICODIN) 5-325 MG per tablet   Oral   Take 1 tablet by mouth every 8 (eight) hours as needed for pain.   30 tablet   0     BP 118/66  Pulse 61  Temp(Src) 97.8 F (36.6  C) (Oral)  Resp 19  SpO2 98%  Physical Exam  Nursing note and vitals reviewed. Constitutional: He appears well-developed and well-nourished. No distress.  HENT:  Head: Normocephalic.    Eyes: EOM are normal. Pupils are equal, round, and reactive to light.  Neck: Normal range of motion. Neck supple.  Cardiovascular: Normal rate, regular rhythm and normal heart sounds.   Pulmonary/Chest: Effort normal and breath sounds normal.  Musculoskeletal: Normal range of motion.  Full ROM of the right index finger at the MCP, PIP, and DIP  Neurological: He is alert.  Skin: Skin is warm and dry. He is not diaphoretic.  Splinter located just lateral to the right index finger.  Mild surrounding edema  Psychiatric: He has a normal mood and affect.    ED Course  Procedures (including critical care time)  Labs Reviewed - No data to display Dg Hand Complete Right  02/05/2013  *RADIOLOGY REPORT*  Clinical Data: Injury, pain, swelling  RIGHT HAND - COMPLETE 3+ VIEW  Comparison: None.  Findings: Normal alignment without fracture.  Sclerotic cortical thickening appearing benign of the right second digit proximal phalanx.  No radiopaque foreign body.  IMPRESSION: No acute finding.  Original Report Authenticated By: Judie Petit. Miles Costain, M.D.      No diagnosis found.  Small incision made just lateral to the right index finger nail and splinter removed.    MDM  Patient presenting with a splinter in his right index finger.  Splinter and several small pieces of debris removed from the finger after small incision was made.  Patient with full ROM of the finger.  No signs of infection at this time.          Pascal Lux [place], PA-C 02/06/13 1651

## 2013-02-05 NOTE — ED Notes (Signed)
Reports debris from lawnmower flying up and hitting rt index finger and bridge of nose.  Pt pain in finger 10/10. Noticeable swelling to rt index finger but pt is able to bend finger.  Nose did bleed at time of injury but bleeding stopped and pt was able to use bandaid. Pt unsure if bleeding was from inside or just outside of nose.  Pt alert oriented X4

## 2013-02-05 NOTE — ED Notes (Signed)
Pt sts debris from lawn mower flew up and hit nose and finger yesterday; pt sts abrasion to bridge on nose bleeding controlled and swollen 2nd digit on right hand

## 2013-02-06 NOTE — Telephone Encounter (Signed)
i have tried to call the pt, unable to reach or leave message

## 2013-02-06 NOTE — ED Provider Notes (Signed)
Medical screening examination/treatment/procedure(s) were performed by non-physician practitioner and as supervising physician I was immediately available for consultation/collaboration.   Gavin Pound. Oletta Lamas, MD 02/06/13 2128

## 2013-02-06 NOTE — Telephone Encounter (Signed)
Patient needs to see ENT doctor for his mandibular fracture. For his hand pain, he needs to make an appointment at clinic with either me or any of our residents. No narcotic prescription at this time.  Lorretta Harp, MD PGY2, Internal Medicine Teaching Service Pager: [phone]

## 2013-02-12 ENCOUNTER — Ambulatory Visit (INDEPENDENT_AMBULATORY_CARE_PROVIDER_SITE_OTHER): Payer: No Typology Code available for payment source | Admitting: Internal Medicine

## 2013-02-12 ENCOUNTER — Encounter: Payer: Self-pay | Admitting: Internal Medicine

## 2013-02-12 VITALS — BP 119/80 | HR 53 | Temp 97.9°F | Ht 67.0 in | Wt 149.5 lb

## 2013-02-12 DIAGNOSIS — IMO0001 Reserved for inherently not codable concepts without codable children: Secondary | ICD-10-CM

## 2013-02-12 DIAGNOSIS — S02609D Fracture of mandible, unspecified, subsequent encounter for fracture with routine healing: Secondary | ICD-10-CM

## 2013-02-12 DIAGNOSIS — S02609G Fracture of mandible, unspecified, subsequent encounter for fracture with delayed healing: Secondary | ICD-10-CM

## 2013-02-12 DIAGNOSIS — R6884 Jaw pain: Secondary | ICD-10-CM

## 2013-02-12 DIAGNOSIS — R269 Unspecified abnormalities of gait and mobility: Secondary | ICD-10-CM | POA: Insufficient documentation

## 2013-02-12 MED ORDER — HYDROCODONE-ACETAMINOPHEN 5-325 MG PO TABS: 1.0000 | ORAL_TABLET | Freq: Three times a day (TID) | ORAL | Status: DC | PRN

## 2013-02-12 NOTE — Patient Instructions (Signed)
1. We will try make an appointment for you with ENT doctor as soon as possible. 2. If you have worsening of your symptoms or new symptoms arise, please call the clinic [phone]), or go to the ER immediately if symptoms are severe.  Fractured Jaw Diet This diet should be used after jaw or mouth surgery, wired jaw surgery, or dental surgery. The consistency of foods in this diet should be thin enough to be sipped from a straw or given through a syringe. It is important to consume enough calories and protein to prevent weight loss and to promote healing after surgery. You will need to have 3 meals and 3 snacks daily. It is important to eat from a variety of food groups. Foods you normally eat may be blended to the correct texture. INSTRUCTIONS FOR BLENDING FOODS  Prepare foods by removing skins, seeds, and peels.  Cook meats and vegetables thoroughly. Avoid using raw eggs. Powdered or pasteurized egg mixtures may be used.  Cut foods into small pieces and mix with a small amount of liquid in a food processor or blender. Continue to add liquids until foods become thin enough to sip through a straw.  Add juice, milk, cream, broth, gravy, or vegetable juice to add flavor and to thin foods.  Blending foods sometimes causes air bubbles that may not be desirable. Heating foods after blending will reduce the foam produced from blending. TIPS  If you are losing weight, you may need to add extra calories to your food by:  Adding powdered milk or protein powder to food.  Adding extra fats, such as tub margarine, sour cream, cream cheese, cream, nut butters (such as peanut butter or almond butter), and half-and-half.  Adding sweets, such as honey, ice cream, black strap molasses, or sugar.  Your teeth and mouth may be sensitive to extreme temperatures. Heat or cool your foods only to lukewarm or cool temperatures.  Stock Water quality scientist with a variety of liquid nutritional supplement drinks.  Take a  liquid multivitamin to ensure you are getting adequate nutrients.  Use baby food if you are short on time or energy. FOOD CHOICES ALL FOODS MUST BE BLENDED. Starches 4 servings or more  Hot cereals, such as oatmeal, grits, ground wheat cereals, and polenta.  Mashed potatoes. Vegetables 3 servings or more  All vegetables and vegetable juices. Fruit 2 servings or more  All fruits and fruit juices. Meat and Meat Substitutes 2 servings or more (6 oz per day)  Soft-boiled eggs, scrambled eggs, cottage cheese, cheese sauce.  Ground meats, such as hamburger, [place], sausage, meatloaf.  Custard, baby food meat. Milk 2 cups or equivalent  Liquid, powdered, or evaporated milk.  Buttermilk or chocolate milk.  Drinkable yogurt.  [place]bert, ice cream, milkshakes, eggnog, pudding, and liquid supplements. Beverages  Coffee (regular or decaffeinated), tea, and mineral water.  Liquid supplements. Condiments  All seasonings and condiments that blend well. Document Released: 04/13/2010 Document Revised: 01/16/2012 Document Reviewed: 04/13/2010 [facility] Patient Information 2013 [place], [place].

## 2013-02-12 NOTE — Progress Notes (Addendum)
[name], male   DOB: [birth year], 42 y.o.   MRN: [id number] Subjective:   [name] male   DOB: 1971/01/08 42 y.o.   MRN: [id number]  CC: Followup visit.  HPI:  [name] is a 42 y.o. man with past medical history as outlined below, who presents for a followup visit today.  Mandibular bone fracture:  Patient reports that he slipped and fell on 11/19/12 when he was shoveling snow at home. Because of pain and two small lacerations over his chin at that time, he visited ED. CT Maxillofacial scan showed nondisplaced fracture extends through the angle of mandible. His chin skin laceration was cleaned and irrigated by ED physician and treated with clindamycin for 7 days. I saw patient in clinic on 12/25/12. His skin laceration healed well. He was given a referral to ENT. Since patient owes a bill and does not have insurance, he has not been seen yet. Today he reports having pain at his mandibular area. He still has trismus and difficulty in chewing food. He takes liquid food only. He lost 15 lbs since 11/19/12.   Denies fever, chills, fatigue, headaches, cough, chest pain, SOB,  abdominal pain,diarrhea, constipation, dysuria, urgency, frequency, hematuria.   Past Medical History  Diagnosis Date  . Scrotal mass   . HCV antibody positive   . Tick borne fever   . Hepatitis C infection   . Dental caries   . Iritis   . Uveitis    Current Outpatient Prescriptions  Medication Sig Dispense Refill  . cephALEXin (KEFLEX) 500 MG capsule Take 1 capsule (500 mg total) by mouth 4 (four) times daily.  28 capsule  0  . HYDROcodone-acetaminophen (NORCO/VICODIN) 5-325 MG per tablet Take 1 tablet by mouth every 8 (eight) hours as needed for pain.  30 tablet  0  . traMADol (ULTRAM) 50 MG tablet Take 1 tablet (50 mg total) by mouth every 6 (six) hours as needed for pain.  15 tablet  0   No current facility-administered medications for this visit.   No family history on  file. History   Social History  . Marital Status: Single    Spouse Name: N/A    Number of Children: 0  . Years of Education: N/A   Occupational History  .     Social History Main Topics  . Smoking status: Passive Smoke Exposure - Never Smoker    Types: Cigarettes  . Smokeless tobacco: Never Used  . Alcohol Use: No  . Drug Use: No  . Sexually Active: None   Other Topics Concern  . None   Social History Narrative  . None    Review of Systems: as per HPI  Objective:  Physical Exam: Filed Vitals:   02/12/13 0822  BP: 119/80  Pulse: 53  Temp: 97.9 F (36.6 C)  TempSrc: Oral  Height: 5\' 7"  (1.702 m)  Weight: 149 lb 8 oz (67.813 kg)  SpO2: 100%   Constitutional: Vital signs reviewed.  Patient is a well-developed and well-nourished, in no acute distress and cooperative with exam.  HEENT:  Head: Normocephalic Ear: TM normal bilaterally Face: previous lacerations over his chin has healed well. Patient has trismus. There is tenderness over his mandible. There is no decreased sensation. Eyes: PERRL, EOMI, conjunctivae normal, No scleral icterus.  Neck: Supple, Trachea midline normal ROM, No JVD, mass, thyromegaly, or carotid bruit present. No lymph node enlargement. Cardiovascular: RRR, S1 normal, S2 normal, no MRG, pulses symmetric and intact bilaterally  Pulmonary/Chest: CTAB, no wheezes, rales, or rhonchi Abdominal: Soft. Non-tender, non-distended, bowel sounds are normal, no masses, organomegaly, or guarding present.   Musculoskeletal: No joint deformities, erythema, or stiffness, ROM full and non-tender Hematology: no cervical, inginal, or axillary adenopathy.  Neurological: A&O x3, Strength is normal and symmetric bilaterally, cranial nerve II-XII are grossly intact, no focal motor deficit, sensory intact to light touch bilaterally.  Skin: No rash, cyanosis, or clubbing.  Psychiatric: Normal mood and affect. speech and behavior is normal. Judgment and thought content  normal.     Assessment & Plan:   Addendum 02/26/13   I tried to call patient again today in order to find out whether patient went to see plastic surgeon in [facility] on 02/18/13. I could not to reach him (no one picked up the phone).   Addendum:  Patient's new CT-Maxillofacial wo CM showed minimal interval remodeling / healing about the nondisplaced mandible symphysis fracture.  No solid bridging bone identified. Interval decreased submental soft tissue thickening. He was scheduled an appointment with Plastic surgeon at [facility] on 02/18/13 at 3:15 p.m. I called him three times to find out whether him was evaluated by plastic surgeon, but was not able to reach him (no one picked up the phone). I hope that he went to have seen plastic surgeon.  Lorretta Harp, MD PGY2, Internal Medicine Teaching Service Pager: [phone]

## 2013-02-12 NOTE — Assessment & Plan Note (Signed)
Patient has a nondisplaced mandibular fracture as evidenced by CT scan on 11/21/12. His skin lacerations over his chin has healed well. There is no signs of infection. Patient was given referral to ENT in previous visit, but he was not seen yet due to owing money to the ENT office. He still has significant pain over his lower jaw. He could not chew food normally. He can only take liquid food now. He lost 15 pounds of body weight since 1/14. We called [facility] and explained his current situation. He was given an appointment with the plastic surgeon at [facility] on 02/18/13 at 3:15 p.m.   - Will treat him symptomatically with Norco for pain until he can be seen by plastic surgeon. - Will repeat Maxillofacial CT to evaluate healing process.

## 2013-02-13 ENCOUNTER — Ambulatory Visit (HOSPITAL_COMMUNITY): Payer: Self-pay

## 2013-02-13 NOTE — Telephone Encounter (Signed)
Saw Dr Clyde Lundborg 02/12/13 about med.

## 2013-02-14 ENCOUNTER — Ambulatory Visit (HOSPITAL_COMMUNITY): Payer: No Typology Code available for payment source

## 2013-02-14 NOTE — Progress Notes (Signed)
I saw patient and discussed his care with resident Dr. Clyde Lundborg.  Patient was seen 2/15 with a mandibular fracture by ED physician, who apparently called ENT and instructed patient to follow up in the ENT office the following week.  However, he was not seen by ENT, and we were unsuccessful in referring him to ENT from our clinic reportedly because of an unpaid bill.  Dr. Clyde Lundborg called ENT today and discussed with ENT physician but was again unsuccessful in getting patient seen there.  I agree with the plan to repeat the maxillofacial CT scan and refer patient to [facility] where we were able to get an appointment.

## 2013-02-22 ENCOUNTER — Ambulatory Visit (HOSPITAL_COMMUNITY)
Admission: RE | Admit: 2013-02-22 | Discharge: 2013-02-22 | Disposition: A | Discharge status: Home / Self Care | Payer: No Typology Code available for payment source | Source: Ambulatory Visit | Attending: Internal Medicine | Admitting: Internal Medicine

## 2013-02-22 DIAGNOSIS — R634 Abnormal weight loss: Secondary | ICD-10-CM | POA: Insufficient documentation

## 2013-02-22 DIAGNOSIS — R6884 Jaw pain: Secondary | ICD-10-CM | POA: Insufficient documentation

## 2013-02-22 DIAGNOSIS — W010XXA Fall on same level from slipping, tripping and stumbling without subsequent striking against object, initial encounter: Secondary | ICD-10-CM | POA: Insufficient documentation

## 2013-02-22 DIAGNOSIS — S02609A Fracture of mandible, unspecified, initial encounter for closed fracture: Secondary | ICD-10-CM | POA: Insufficient documentation

## 2013-02-22 DIAGNOSIS — Y93H1 Activity, digging, shoveling and raking: Secondary | ICD-10-CM | POA: Insufficient documentation

## 2013-02-22 DIAGNOSIS — S02609G Fracture of mandible, unspecified, subsequent encounter for fracture with delayed healing: Secondary | ICD-10-CM

## 2013-09-10 ENCOUNTER — Encounter (HOSPITAL_COMMUNITY): Payer: Self-pay | Admitting: Emergency Medicine

## 2013-09-10 ENCOUNTER — Emergency Department (HOSPITAL_COMMUNITY)
Admission: EM | Admit: 2013-09-10 | Discharge: 2013-09-10 | Disposition: A | Discharge status: Home / Self Care | Payer: Self-pay | Attending: Emergency Medicine | Admitting: Emergency Medicine

## 2013-09-10 ENCOUNTER — Emergency Department (HOSPITAL_COMMUNITY): Payer: Self-pay

## 2013-09-10 DIAGNOSIS — Z8669 Personal history of other diseases of the nervous system and sense organs: Secondary | ICD-10-CM | POA: Insufficient documentation

## 2013-09-10 DIAGNOSIS — S4991XA Unspecified injury of right shoulder and upper arm, initial encounter: Secondary | ICD-10-CM

## 2013-09-10 DIAGNOSIS — Y929 Unspecified place or not applicable: Secondary | ICD-10-CM | POA: Insufficient documentation

## 2013-09-10 DIAGNOSIS — S46909A Unspecified injury of unspecified muscle, fascia and tendon at shoulder and upper arm level, unspecified arm, initial encounter: Secondary | ICD-10-CM | POA: Insufficient documentation

## 2013-09-10 DIAGNOSIS — Z8719 Personal history of other diseases of the digestive system: Secondary | ICD-10-CM | POA: Insufficient documentation

## 2013-09-10 DIAGNOSIS — Z792 Long term (current) use of antibiotics: Secondary | ICD-10-CM | POA: Insufficient documentation

## 2013-09-10 DIAGNOSIS — S5010XA Contusion of unspecified forearm, initial encounter: Secondary | ICD-10-CM | POA: Insufficient documentation

## 2013-09-10 DIAGNOSIS — S59909A Unspecified injury of unspecified elbow, initial encounter: Secondary | ICD-10-CM | POA: Insufficient documentation

## 2013-09-10 DIAGNOSIS — Y939 Activity, unspecified: Secondary | ICD-10-CM | POA: Insufficient documentation

## 2013-09-10 DIAGNOSIS — W19XXXA Unspecified fall, initial encounter: Secondary | ICD-10-CM

## 2013-09-10 DIAGNOSIS — Z8619 Personal history of other infectious and parasitic diseases: Secondary | ICD-10-CM | POA: Insufficient documentation

## 2013-09-10 DIAGNOSIS — S5011XA Contusion of right forearm, initial encounter: Secondary | ICD-10-CM

## 2013-09-10 DIAGNOSIS — W010XXA Fall on same level from slipping, tripping and stumbling without subsequent striking against object, initial encounter: Secondary | ICD-10-CM | POA: Insufficient documentation

## 2013-09-10 DIAGNOSIS — Z880 Allergy status to penicillin: Secondary | ICD-10-CM | POA: Insufficient documentation

## 2013-09-10 DIAGNOSIS — W108XXA Fall (on) (from) other stairs and steps, initial encounter: Secondary | ICD-10-CM | POA: Insufficient documentation

## 2013-09-10 DIAGNOSIS — S6990XA Unspecified injury of unspecified wrist, hand and finger(s), initial encounter: Secondary | ICD-10-CM | POA: Insufficient documentation

## 2013-09-10 DIAGNOSIS — S4980XA Other specified injuries of shoulder and upper arm, unspecified arm, initial encounter: Secondary | ICD-10-CM | POA: Insufficient documentation

## 2013-09-10 DIAGNOSIS — Z9889 Other specified postprocedural states: Secondary | ICD-10-CM | POA: Insufficient documentation

## 2013-09-10 DIAGNOSIS — Z87448 Personal history of other diseases of urinary system: Secondary | ICD-10-CM | POA: Insufficient documentation

## 2013-09-10 MED ORDER — IBUPROFEN 800 MG PO TABS: 800.0000 mg | ORAL_TABLET | Freq: Three times a day (TID) | ORAL | Status: DC | PRN

## 2013-09-10 MED ORDER — HYDROCODONE-ACETAMINOPHEN 5-325 MG PO TABS
1.0000 | ORAL_TABLET | Freq: Once | ORAL | Status: AC
  Administered 2013-09-10: 1 via ORAL
  Filled 2013-09-10: qty 1

## 2013-09-10 MED ORDER — HYDROCODONE-ACETAMINOPHEN 5-325 MG PO TABS: 1.0000 | ORAL_TABLET | Freq: Four times a day (QID) | ORAL | Status: DC | PRN

## 2013-09-10 NOTE — ED Provider Notes (Signed)
Medical screening examination/treatment/procedure(s) were conducted as a shared visit with non-physician practitioner(s) or resident and myself. I personally evaluated the patient during the encounter and agree with the findings and plan unless otherwise indicated.  I have personally reviewed any xrays and/ or EKG's with the provider and I agree with interpretation.  Mechanical fall last night. Pain right shoulder with all rom, hx of rotator surgery. No head injury.  No fevers/ swelling. Tender lateral and anterior shoulder, pain with ext rom and int rom, decr rom due to pain, no warmth to joint, nv intact.  Concern for ligamentous strain. Fup with ortho and pain meds discussed. Xray no fx.   RIght shoulder strain, Fall   Enid Skeens, MD 09/10/13 1721

## 2013-09-10 NOTE — ED Provider Notes (Signed)
CSN: [id number]     Arrival date & time 09/10/13  0825 History   First MD Initiated Contact with Patient 09/10/13 0919     Chief Complaint  Patient presents with  . Fall  . Shoulder Pain  . Arm Injury   HPI  Bruce [name] is a 42 yo male with a history of Right rotator cuff surgery who complains of Right shoulder, elbow, and wrist pain after a fall yesterday evening. Patient slipped on something at the top of his stairs and broke his fall with his hand and then hit his elbow and shoulder. He did not hit his head or lose consciousness. Patient denies dizziness, headaches, SOB, chest pain, palpitations, or vision changes before the fall. Took 2 ibuprofen and iced arm last night. This morning, patient is unable to move right arm due to pain. Pain is 7/10 and constant. Some numbness and tingling in his right arm and he feels weak. No numbness or weakness in lower extremities or face.   Past Medical History  Diagnosis Date  . Scrotal mass   . HCV antibody positive   . Tick borne fever   . Hepatitis C infection   . Dental caries   . Iritis   . Uveitis    Past Surgical History  Procedure Laterality Date  . Finger surgery      infection, middle left  . Shoulder surgery      right  . Fractured jaw     No family history on file. History  Substance Use Topics  . Smoking status: Passive Smoke Exposure - Never Smoker    Types: Cigarettes  . Smokeless tobacco: Never Used  . Alcohol Use: No    Review of Systems All other systems negative except as documented in the HPI. All pertinent positives and negatives as reviewed in the HPI. Allergies  Penicillins  Home Medications   Current Outpatient Rx  Name  Route  Sig  Dispense  Refill  . doxycycline (VIBRAMYCIN) 100 MG capsule   Oral   Take 100 mg by mouth 2 (two) times daily. Starting 09/09/13 for 5 days          BP 119/70  Pulse 60  Temp(Src) 98 F (36.7 C) (Oral)  Resp 16  SpO2 99% Physical Exam  Constitutional: He is  oriented to person, place, and time. He appears well-developed and well-nourished. No distress.  HENT:  Head: Normocephalic and atraumatic.  Eyes: EOM are normal. Pupils are equal, round, and reactive to light.  Neck: Normal range of motion. No spinous process tenderness present.  Cardiovascular: Normal rate, regular rhythm, S1 normal and S2 normal.   Pulses:      Radial pulses are 2+ on the right side, and 2+ on the left side.  Pulmonary/Chest: Effort normal and breath sounds normal.  Musculoskeletal:       Right shoulder: He exhibits bony tenderness, swelling and decreased strength. He exhibits normal range of motion, no deformity and no laceration.       Left shoulder: Normal.       Right elbow: He exhibits normal range of motion and no deformity. Tenderness found. Medial epicondyle and olecranon process tenderness noted.       Left elbow: Normal.       Right wrist: He exhibits tenderness. He exhibits normal range of motion and no deformity.       Left wrist: Normal.       Arms: Neurological: He is alert and oriented to person,  place, and time. He displays no atrophy. No cranial nerve deficit or sensory deficit. He exhibits normal muscle tone. Gait normal.  Reflex Scores:      Tricep reflexes are 2+ on the right side and 2+ on the left side.      Bicep reflexes are 2+ on the right side and 2+ on the left side.      Brachioradialis reflexes are 2+ on the right side and 2+ on the left side. Skin: Skin is warm and dry.  Psychiatric: He has a normal mood and affect.    ED Course  Procedures (including critical care time) Patient is explained the results of his testing here today.  Patient is advised, that he'll need followup with orthopedics, for further evaluation is told to use ice and heat on the shoulder   Carlyle Dolly, PA-C 09/10/13 1121  Carlyle Dolly, PA-C 09/10/13 1122

## 2013-09-10 NOTE — ED Notes (Signed)
Pt c/o right shoulder and arm pain after he fell down the stairs last night around 10pm. Denies hitting head and is unsure of how many steps he fell down.

## 2017-10-06 ENCOUNTER — Emergency Department
Admission: EM | Admit: 2017-10-06 | Discharge: 2017-10-06 | Disposition: A | Discharge status: Home / Self Care | Payer: Self-pay | Attending: Emergency Medicine | Admitting: Emergency Medicine

## 2017-10-06 ENCOUNTER — Encounter: Payer: Self-pay | Admitting: Emergency Medicine

## 2017-10-06 DIAGNOSIS — L03113 Cellulitis of right upper limb: Secondary | ICD-10-CM | POA: Insufficient documentation

## 2017-10-06 DIAGNOSIS — Z7722 Contact with and (suspected) exposure to environmental tobacco smoke (acute) (chronic): Secondary | ICD-10-CM | POA: Insufficient documentation

## 2017-10-06 DIAGNOSIS — L03114 Cellulitis of left upper limb: Secondary | ICD-10-CM | POA: Insufficient documentation

## 2017-10-06 MED ORDER — SULFAMETHOXAZOLE-TRIMETHOPRIM 800-160 MG PO TABS: 1.0000 | ORAL_TABLET | Freq: Two times a day (BID) | ORAL | 0 refills | Status: DC

## 2017-10-06 MED ORDER — MUPIROCIN CALCIUM 2 % EX CREA: TOPICAL_CREAM | CUTANEOUS | 0 refills | Status: DC

## 2017-10-06 MED ORDER — LIDOCAINE 5 % EX OINT: 1.0000 "application " | TOPICAL_OINTMENT | CUTANEOUS | 0 refills | Status: DC | PRN

## 2017-10-06 MED ORDER — SULFAMETHOXAZOLE-TRIMETHOPRIM 800-160 MG PO TABS
1.0000 | ORAL_TABLET | Freq: Once | ORAL | Status: AC
  Administered 2017-10-06: 1 via ORAL
  Filled 2017-10-06: qty 1

## 2017-10-06 NOTE — ED Triage Notes (Signed)
Pt in via POV with complaints of bilateral hand pain, pt with 3 areas which he states started out as blisters.  Pt reports pain and puss like drainage from areas.  Pt afebrile.  NAD noted at this time.
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
## 2017-10-06 NOTE — ED Notes (Signed)
wound on rt thumb on both sides , also has a wound on outer edge of palm of left hand. Pt states having some numbness around the injuries

## 2018-03-19 ENCOUNTER — Emergency Department: Payer: Self-pay

## 2018-03-19 ENCOUNTER — Encounter: Payer: Self-pay | Admitting: Emergency Medicine

## 2018-03-19 ENCOUNTER — Other Ambulatory Visit: Payer: Self-pay

## 2018-03-19 ENCOUNTER — Inpatient Hospital Stay
Admission: EM | Admit: 2018-03-19 | Discharge: 2018-03-20 | DRG: 310 | Disposition: A | Discharge status: Home / Self Care | Payer: Self-pay | Attending: Internal Medicine | Admitting: Internal Medicine

## 2018-03-19 DIAGNOSIS — B1920 Unspecified viral hepatitis C without hepatic coma: Secondary | ICD-10-CM | POA: Diagnosis present

## 2018-03-19 DIAGNOSIS — E876 Hypokalemia: Secondary | ICD-10-CM | POA: Diagnosis present

## 2018-03-19 DIAGNOSIS — R739 Hyperglycemia, unspecified: Secondary | ICD-10-CM | POA: Diagnosis present

## 2018-03-19 DIAGNOSIS — R079 Chest pain, unspecified: Secondary | ICD-10-CM

## 2018-03-19 DIAGNOSIS — Z716 Tobacco abuse counseling: Secondary | ICD-10-CM

## 2018-03-19 DIAGNOSIS — F1410 Cocaine abuse, uncomplicated: Secondary | ICD-10-CM | POA: Diagnosis present

## 2018-03-19 DIAGNOSIS — F17210 Nicotine dependence, cigarettes, uncomplicated: Secondary | ICD-10-CM | POA: Diagnosis present

## 2018-03-19 DIAGNOSIS — Z880 Allergy status to penicillin: Secondary | ICD-10-CM

## 2018-03-19 DIAGNOSIS — Z79899 Other long term (current) drug therapy: Secondary | ICD-10-CM

## 2018-03-19 DIAGNOSIS — F1010 Alcohol abuse, uncomplicated: Secondary | ICD-10-CM | POA: Diagnosis present

## 2018-03-19 DIAGNOSIS — I480 Paroxysmal atrial fibrillation: Principal | ICD-10-CM | POA: Diagnosis present

## 2018-03-19 DIAGNOSIS — Z813 Family history of other psychoactive substance abuse and dependence: Secondary | ICD-10-CM

## 2018-03-19 DIAGNOSIS — I4891 Unspecified atrial fibrillation: Secondary | ICD-10-CM | POA: Diagnosis present

## 2018-03-19 HISTORY — DX: Paroxysmal atrial fibrillation: I48.0

## 2018-03-19 HISTORY — DX: Other psychoactive substance abuse, uncomplicated: F19.10

## 2018-03-19 LAB — TROPONIN I: Troponin I: 0.03 ng/mL (ref <0.03)

## 2018-03-19 LAB — CBC
HCT: 50.8 % (ref 40.0–52.0)
Hemoglobin: 17.8 g/dL (ref 13.0–18.0)
MCH: 31.6 pg (ref 26.0–34.0)
MCHC: 35.1 g/dL (ref 32.0–36.0)
MCV: 89.9 fL (ref 80.0–100.0)
PLATELETS: 241 10*3/uL (ref 150–440)
RBC: 5.65 MIL/uL (ref 4.40–5.90)
RDW: 14.3 % (ref 11.5–14.5)
WBC: 3.7 10*3/uL — ABNORMAL LOW (ref 3.8–10.6)

## 2018-03-19 LAB — COMPREHENSIVE METABOLIC PANEL
ALBUMIN: 4.2 g/dL (ref 3.5–5.0)
ALK PHOS: 89 U/L (ref 38–126)
ALT: 54 U/L (ref 17–63)
AST: 80 U/L — ABNORMAL HIGH (ref 15–41)
Anion gap: 13 (ref 5–15)
BUN: 7 mg/dL (ref 6–20)
CALCIUM: 8.8 mg/dL — AB (ref 8.9–10.3)
CHLORIDE: 100 mmol/L — AB (ref 101–111)
CO2: 25 mmol/L (ref 22–32)
CREATININE: 0.94 mg/dL (ref 0.61–1.24)
GFR calc Af Amer: >60 mL/min (ref >60)
GFR calc non Af Amer: >60 mL/min (ref >60)
GLUCOSE: 132 mg/dL — AB (ref 65–99)
Potassium: 3 mmol/L — ABNORMAL LOW (ref 3.5–5.1)
SODIUM: 138 mmol/L (ref 135–145)
Total Bilirubin: 1.2 mg/dL (ref 0.3–1.2)
Total Protein: 8.6 g/dL — ABNORMAL HIGH (ref 6.5–8.1)

## 2018-03-19 LAB — MAGNESIUM: MAGNESIUM: 2 mg/dL (ref 1.7–2.4)

## 2018-03-19 MED ORDER — SENNOSIDES-DOCUSATE SODIUM 8.6-50 MG PO TABS: 1.0000 | ORAL_TABLET | Freq: Every evening | ORAL | Status: DC | PRN

## 2018-03-19 MED ORDER — POTASSIUM CHLORIDE CRYS ER 20 MEQ PO TBCR
40.0000 meq | EXTENDED_RELEASE_TABLET | Freq: Once | ORAL | Status: AC
  Administered 2018-03-19: 40 meq via ORAL
  Filled 2018-03-19: qty 2

## 2018-03-19 MED ORDER — METOPROLOL TARTRATE 5 MG/5ML IV SOLN: 5.0000 mg | INTRAVENOUS | Status: DC | PRN

## 2018-03-19 MED ORDER — ONDANSETRON HCL 4 MG/2ML IJ SOLN
4.0000 mg | Freq: Once | INTRAMUSCULAR | Status: AC
  Administered 2018-03-19: 4 mg via INTRAVENOUS
  Filled 2018-03-19: qty 2

## 2018-03-19 MED ORDER — DILTIAZEM HCL ER COATED BEADS 120 MG PO CP24
120.0000 mg | ORAL_CAPSULE | Freq: Every day | ORAL | Status: DC
  Administered 2018-03-19 – 2018-03-20 (×2): 120 mg via ORAL
  Filled 2018-03-19 (×2): qty 1

## 2018-03-19 MED ORDER — LORAZEPAM 2 MG PO TABS
0.0000 mg | ORAL_TABLET | Freq: Four times a day (QID) | ORAL | Status: DC
  Administered 2018-03-19: 1 mg via ORAL

## 2018-03-19 MED ORDER — ONDANSETRON HCL 4 MG/2ML IJ SOLN
4.0000 mg | Freq: Four times a day (QID) | INTRAMUSCULAR | Status: DC | PRN
  Administered 2018-03-19: 4 mg via INTRAVENOUS
  Filled 2018-03-19: qty 2

## 2018-03-19 MED ORDER — VITAMIN B-1 100 MG PO TABS
100.0000 mg | ORAL_TABLET | Freq: Every day | ORAL | Status: DC
  Administered 2018-03-19 – 2018-03-20 (×2): 100 mg via ORAL
  Filled 2018-03-19 (×2): qty 1

## 2018-03-19 MED ORDER — LORAZEPAM 2 MG PO TABS: 0.0000 mg | ORAL_TABLET | Freq: Two times a day (BID) | ORAL | Status: DC

## 2018-03-19 MED ORDER — LORAZEPAM 1 MG PO TABS: 1.0000 mg | ORAL_TABLET | Freq: Four times a day (QID) | ORAL | Status: DC | PRN

## 2018-03-19 MED ORDER — ONDANSETRON HCL 4 MG PO TABS: 4.0000 mg | ORAL_TABLET | Freq: Four times a day (QID) | ORAL | Status: DC | PRN

## 2018-03-19 MED ORDER — DILTIAZEM HCL 100 MG IV SOLR
5.0000 mg/h | Freq: Once | INTRAVENOUS | Status: AC
  Administered 2018-03-19: 5 mg/h via INTRAVENOUS
  Filled 2018-03-19: qty 100

## 2018-03-19 MED ORDER — ACETAMINOPHEN 650 MG RE SUPP: 650.0000 mg | Freq: Four times a day (QID) | RECTAL | Status: DC | PRN

## 2018-03-19 MED ORDER — SODIUM CHLORIDE 0.9 % IV SOLN
INTRAVENOUS | Status: DC
  Administered 2018-03-19: 21:00:00 via INTRAVENOUS

## 2018-03-19 MED ORDER — DILTIAZEM HCL 25 MG/5ML IV SOLN
INTRAVENOUS | Status: AC
  Administered 2018-03-19: 15 mg via INTRAVENOUS
  Filled 2018-03-19: qty 5

## 2018-03-19 MED ORDER — ACETAMINOPHEN 325 MG PO TABS
650.0000 mg | ORAL_TABLET | Freq: Four times a day (QID) | ORAL | Status: DC | PRN
  Administered 2018-03-19: 650 mg via ORAL
  Filled 2018-03-19: qty 2

## 2018-03-19 MED ORDER — ENOXAPARIN SODIUM 40 MG/0.4ML ~~LOC~~ SOLN
40.0000 mg | SUBCUTANEOUS | Status: DC
  Administered 2018-03-19: 40 mg via SUBCUTANEOUS
  Filled 2018-03-19: qty 0.4

## 2018-03-19 MED ORDER — THIAMINE HCL 100 MG/ML IJ SOLN: 100.0000 mg | Freq: Every day | INTRAMUSCULAR | Status: DC

## 2018-03-19 MED ORDER — DILTIAZEM HCL 25 MG/5ML IV SOLN: 15.0000 mg | Freq: Once | INTRAVENOUS | Status: AC

## 2018-03-19 MED ORDER — OXYCODONE HCL 5 MG PO TABS
5.0000 mg | ORAL_TABLET | ORAL | Status: DC | PRN
  Administered 2018-03-19: 5 mg via ORAL
  Filled 2018-03-19: qty 1

## 2018-03-19 MED ORDER — SODIUM CHLORIDE 0.9 % IV BOLUS
1000.0000 mL | Freq: Once | INTRAVENOUS | Status: AC
  Administered 2018-03-19: 1000 mL via INTRAVENOUS

## 2018-03-19 MED ORDER — DILTIAZEM HCL 100 MG IV SOLR
5.0000 mg/h | INTRAVENOUS | Status: DC
  Administered 2018-03-19: 7.5 mg/h via INTRAVENOUS

## 2018-03-19 MED ORDER — ADULT MULTIVITAMIN W/MINERALS CH
1.0000 | ORAL_TABLET | Freq: Every day | ORAL | Status: DC
  Administered 2018-03-19 – 2018-03-20 (×2): 1 via ORAL
  Filled 2018-03-19 (×2): qty 1

## 2018-03-19 MED ORDER — FOLIC ACID 1 MG PO TABS
1.0000 mg | ORAL_TABLET | Freq: Every day | ORAL | Status: DC
  Administered 2018-03-19 – 2018-03-20 (×2): 1 mg via ORAL
  Filled 2018-03-19 (×2): qty 1

## 2018-03-19 MED ORDER — LORAZEPAM 2 MG/ML IJ SOLN
1.0000 mg | Freq: Four times a day (QID) | INTRAMUSCULAR | Status: DC | PRN
  Filled 2018-03-19: qty 1

## 2018-03-19 NOTE — ED Triage Notes (Signed)
cheswt pain heart racing and vomiting since he got up this am.

## 2018-03-19 NOTE — ED Notes (Signed)
Pt reporting nausea and headache starting at this time. Lights dimmed and RN will release floor orders. Patient updated on delay for transport to the floor.

## 2018-03-19 NOTE — H&P (Signed)
[facility] [place] [facility]   PATIENT NAME: Bruce [name]    MR#:  [id number]  DATE OF BIRTH:  1970-11-25  DATE OF ADMISSION:  03/19/2018  PRIMARY CARE PHYSICIAN: Patient, No Pcp Per   REQUESTING/REFERRING PHYSICIAN: dr Derrill Kay  CHIEF COMPLAINT:   Chest pain HISTORY OF PRESENT ILLNESS:  Bruce [name]  is a 47 y.o. male with a known history of hepatitis C who presents today to the emergency room due to chest pain.  Patient reports over the past 24 hours he has had substernal chest pain without radiation however associated shortness of breath and palpitations.  When he arrived in the ER it is noted that he was in new onset atrial fibrillation with a heart rate of 137.  He is given diltiazem.  His heart rates are anywhere between 100-126.  Patient reports that 3 days ago for his birthday he went on a binge drinking episode.  He does not usually drink on a daily basis.  PAST MEDICAL HISTORY:   Past Medical History:  Diagnosis Date  . Dental caries   . HCV antibody positive   . Hepatitis C infection   . Iritis   . Scrotal mass   . Tick borne fever   . Uveitis     PAST SURGICAL HISTORY:   Past Surgical History:  Procedure Laterality Date  . FINGER SURGERY     infection, middle left  . fractured jaw    . SHOULDER SURGERY     right    SOCIAL HISTORY:   Social History   Tobacco Use  . Smoking status: Passive Smoke Exposure - Never Smoker  . Smokeless tobacco: Never Used  Substance Use Topics  . Alcohol use: No    Alcohol/week: 0.0 oz    FAMILY HISTORY:  No history of CAD or hypertension  DRUG ALLERGIES:   Allergies  Allergen Reactions  . Penicillins Other (See Comments)    Childhood reaction    REVIEW OF SYSTEMS:   Review of Systems  Constitutional: Negative.  Negative for chills, fever and malaise/fatigue.  HENT: Negative.  Negative for ear discharge, ear pain, hearing loss, nosebleeds and sore throat.   Eyes: Negative.  Negative for  blurred vision and pain.  Respiratory: Positive for shortness of breath. Negative for cough, hemoptysis and wheezing.   Cardiovascular: Positive for chest pain and palpitations. Negative for leg swelling.  Gastrointestinal: Negative.  Negative for abdominal pain, blood in stool, diarrhea, nausea and vomiting.  Genitourinary: Negative.  Negative for dysuria.  Musculoskeletal: Negative.  Negative for back pain.  Skin: Negative.   Neurological: Negative for dizziness, tremors, speech change, focal weakness, seizures and headaches.  Endo/Heme/Allergies: Negative.  Does not bruise/bleed easily.  Psychiatric/Behavioral: Negative.  Negative for depression, hallucinations and suicidal ideas.    MEDICATIONS AT HOME:   None  Not on File      VITAL SIGNS:  Blood pressure 105/82, pulse 82, temperature 98.1 F (36.7 C), temperature source Oral, resp. rate 18, height 5\' 7"  (1.702 m), weight 81.6 kg (180 lb), SpO2 98 %.  PHYSICAL EXAMINATION:   Physical Exam  Constitutional: He is oriented to person, place, and time. No distress.  HENT:  Head: Normocephalic.  Eyes: No scleral icterus.  Neck: Normal range of motion. Neck supple. No JVD present. No tracheal deviation present.  Cardiovascular: Normal heart sounds. Exam reveals no gallop and no friction rub.  No murmur heard. Irregular, irregular tachycardia  Pulmonary/Chest: Effort normal and breath sounds normal. No  respiratory distress. He has no wheezes. He has no rales. He exhibits no tenderness.  Abdominal: Soft. Bowel sounds are normal. He exhibits no distension and no mass. There is no tenderness. There is no rebound and no guarding.  Musculoskeletal: Normal range of motion. He exhibits no edema.  Neurological: He is alert and oriented to person, place, and time.  Skin: Skin is warm. No rash noted. No erythema.  Psychiatric: Judgment normal.      LABORATORY PANEL:   CBC Recent Labs  Lab 03/19/18 1532  WBC 3.7*  HGB 17.8  HCT  50.8  PLT 241   ------------------------------------------------------------------------------------------------------------------  Chemistries  Recent Labs  Lab 03/19/18 1532  NA 138  K 3.0*  CL 100*  CO2 25  GLUCOSE 132*  BUN 7  CREATININE 0.94  CALCIUM 8.8*  AST 80*  ALT 54  ALKPHOS 89  BILITOT 1.2   ------------------------------------------------------------------------------------------------------------------  Cardiac Enzymes Recent Labs  Lab 03/19/18 1532  TROPONINI 0.03*   ------------------------------------------------------------------------------------------------------------------  RADIOLOGY:  No results found.  EKG:  Atrial fibrillation heart rate 137 no ST elevation or depression  IMPRESSION AND PLAN:   47 year old male with history of hepatitis C and tobacco dependence who presents with chest pain.  1.  Atrial fibrillation with RVR, new onset: Continue diltiazem drip Oral diltiazem 120 mg daily Continue telemetry Check echocardiogram and TSH Cardiology consultation requested  2.  Chest pain: This is likely due to atrial fibrillation Monitor on telemetry and continue to trend troponins  3.  Hepatitis C  4.Tobacco dependence: Patient is encouraged to quit smoking. Counseling was provided for 4 minutes.  5.  EtOH abuse: Patient is encouraged to not binge drink  6.  Hypokalemia: Check magnesium and replete electrolytes  All the records are reviewed and case discussed with ED provider. Management plans discussed with the patient and wife and they are in agreement  CODE STATUS: Full  TOTAL TIME TAKING CARE OF THIS PATIENT: 38 minutes.    Pheobe Sandiford M.D on 03/19/2018 at 5:30 PM  Between 7am to 6pm - Pager - [phone]  After 6pm go to [email or web address] - password [email or web address]  [facility] [facility]  Office  [phone]  CC: Primary care physician; Patient, No Pcp Per

## 2018-03-19 NOTE — Progress Notes (Addendum)
Pharmacy Electrolyte Monitoring Consult:  Pharmacy consulted to assist in monitoring and replacing electrolytes in this 47 y.o. male admitted on 03/19/2018 with Chest Pain and Palpitations   Labs:  Sodium (mmol/L)  Date Value  03/19/2018 138   Potassium (mmol/L)  Date Value  03/19/2018 3.0 (L)   Calcium (mg/dL)  Date Value  16/08/9603 8.8 (L)   Albumin (g/dL)  Date Value  54/07/8118 4.2    Assessment/Plan:  K 3.0  Mag 2.0 MD ordered KCL 40 meq PO x 1.  Will f/u with am labs  Kassie Keng A 03/19/2018 6:13 PM

## 2018-03-19 NOTE — ED Provider Notes (Signed)
[facility] Emergency Department Provider Note  ____________________________________________   I have reviewed the triage vital signs and the nursing notes.   HISTORY  Chief Complaint Chest Pain and Palpitations   History limited by: Not Limited   HPI Bruce [name] is a 47 y.o. male who presents to the emergency department today because of concern for chest pain and palpitations. The patient states that these symptoms started this morning. It is associated with feeling that his heart is racing. It started when he was sitting down. The patient has had associated nausea and vomiting. Has had associated abdominal pain. States that he celebrated his birthday yesterday and drank more alcohol than he normally would. Denies any history of any heart disease.   Per medical record review patient has no history of atrial fibrillation.  Past Medical History:  Diagnosis Date  . Dental caries   . HCV antibody positive   . Hepatitis C infection   . Iritis   . Scrotal mass   . Tick borne fever   . Uveitis     Patient Active Problem List   Diagnosis Date Noted  . Abnormality of gait 02/12/2013  . Mandibular fracture (HCC) 12/25/2012  . Flu vaccine need 12/25/2012  . Scrotal mass 05/22/2012  . Dental caries 05/22/2012  . Hepatitis C infection 05/16/2012  . Myalgia 05/06/2012  . Arthralgia 05/06/2012  . Tick borne fever, likely 05/05/2012  . Tinea cruris, likely 05/05/2012  . Spermatocele of epididymis 05/01/2012  . Bilateral varicoceles 05/01/2012  . Bilateral hydrocele 05/01/2012    Past Surgical History:  Procedure Laterality Date  . FINGER SURGERY     infection, middle left  . fractured jaw    . SHOULDER SURGERY     right    Prior to Admission medications   Medication Sig Start Date End Date Taking? Authorizing Provider  doxycycline (VIBRAMYCIN) 100 MG capsule Take 100 mg by mouth 2 (two) times daily. Starting 09/09/13 for 5 days    [provider]  HYDROcodone-acetaminophen (NORCO/VICODIN) 5-325 MG per tablet Take 1 tablet by mouth every 6 (six) hours as needed for moderate pain. 09/10/13   Lawyer, Cristal Deer, PA-C  ibuprofen (ADVIL,MOTRIN) 800 MG tablet Take 1 tablet (800 mg total) by mouth every 8 (eight) hours as needed. 09/10/13   Lawyer, Cristal Deer, PA-C  lidocaine (XYLOCAINE) 5 % ointment Apply 1 application topically as needed. 10/06/17   Cuthriell, Delorise Royals, PA-C  mupirocin cream (BACTROBAN) 2 % Apply to affected area 3 times daily 10/06/17   Cuthriell, Delorise Royals, PA-C  sulfamethoxazole-trimethoprim (BACTRIM DS,SEPTRA DS) 800-160 MG tablet Take 1 tablet by mouth 2 (two) times daily. 10/06/17   Cuthriell, Delorise Royals, PA-C    Allergies Penicillins  No family history on file.  Social History Social History   Tobacco Use  . Smoking status: Passive Smoke Exposure - Never Smoker  . Smokeless tobacco: Never Used  Substance Use Topics  . Alcohol use: No    Alcohol/week: 0.0 oz  . Drug use: No    Review of Systems Constitutional: No fever/chills Eyes: No visual changes. ENT: No sore throat. Cardiovascular: Positive for palpitations and chest pain. Respiratory: Positive for shortness of breath. Gastrointestinal: No abdominal pain.  No nausea, no vomiting.  No diarrhea.   Genitourinary: Negative for dysuria. Musculoskeletal: Negative for back pain. Skin: Negative for rash. Neurological: Negative for headaches, focal weakness or numbness.  ____________________________________________   PHYSICAL EXAM:  VITAL SIGNS: ED Triage Vitals  Enc Vitals Group  BP 03/19/18 1502 131/90     Pulse Rate 03/19/18 1502 76     Resp 03/19/18 1502 16     Temp 03/19/18 1502 98.1 F (36.7 C)     Temp Source 03/19/18 1502 Oral     SpO2 03/19/18 1502 96 %     Weight 03/19/18 1501 180 lb (81.6 kg)     Height 03/19/18 1501  (1.702 m)     Head Circumference --      Peak Flow --      Pain Score 03/19/18 1501 6     Constitutional: Alert and oriented. Well appearing and in no distress. Eyes: Conjunctivae are normal.  ENT   Head: Normocephalic and atraumatic.   Nose: No congestion/rhinnorhea.   Mouth/Throat: Mucous membranes are moist.   Neck: No stridor. Hematological/Lymphatic/Immunilogical: No cervical lymphadenopathy. Cardiovascular: Tachycardic, irregularly irregular rhythm.  No murmurs, rubs, or gallops. Respiratory: Normal respiratory effort without tachypnea nor retractions. Breath sounds are clear and equal bilaterally. No wheezes/rales/rhonchi. Gastrointestinal: Soft and somewhat diffusely tender. No rebound. No guarding.  Genitourinary: Deferred Musculoskeletal: Normal range of motion in all extremities. No lower extremity edema. Neurologic:  Normal speech and language. No gross focal neurologic deficits are appreciated.  Skin:  Skin is warm, dry and intact. No rash noted. Psychiatric: Mood and affect are normal. Speech and behavior are normal. Patient exhibits appropriate insight and judgment.  ____________________________________________    LABS (pertinent positives/negatives)  Trop 0.03 CBC wbc 3.7, hgb 17.8, plt 241 CMP na 138, k 3.0, glu 132, cr 0.94  ____________________________________________   EKG  I, Phineas Semen, attending physician, personally viewed and interpreted this EKG  EKG Time: 1454 Rate: 137 Rhythm: atrial fibrillation with RVR Axis: normal Intervals: qtc 480 QRS: narrow ST changes: no st elevation Impression: abnormal ekg   ____________________________________________    RADIOLOGY  CXR Atelectasis  ____________________________________________   PROCEDURES  Procedures  CRITICAL CARE Performed by: Phineas Semen   Total critical care time: 35 minutes  Critical care time was exclusive of separately billable procedures and treating other patients.  Critical care was necessary to treat or prevent imminent or  life-threatening deterioration.  Critical care was time spent personally by me on the following activities: development of treatment plan with patient and/or surrogate as well as nursing, discussions with consultants, evaluation of patient's response to treatment, examination of patient, obtaining history from patient or surrogate, ordering and performing treatments and interventions, ordering and review of laboratory studies, ordering and review of radiographic studies, pulse oximetry and re-evaluation of patient's condition.  ____________________________________________   INITIAL IMPRESSION / ASSESSMENT AND PLAN / ED COURSE  Pertinent labs & imaging results that were available during my care of the patient were reviewed by me and considered in my medical decision making (see chart for details).  Patient presented to the emergency department today because of concerns for chest pain and palpitations.  Initial EKG does show atrial fibrillation with RVR.  Patient denied any history of atrial fibrillation.  On exam patient did look somewhat uncomfortable.  Unclear etiology of the A. fib however patient was found to be hypokalemic.  Patient was given a dose of diltiazem which did help control his heart rate somewhat.  However continued to have some runs of significant tachycardia so he was put on diltiazem infusion.  Patient will be admitted to the hospital service for further work-up and management.  ____________________________________________   FINAL CLINICAL IMPRESSION(S) / ED DIAGNOSES  Final diagnoses:  Chest pain, unspecified type  Atrial fibrillation with RVR (HCC)  Hypokalemia     Note: This dictation was prepared with Dragon dictation. Any transcriptional errors that result from this process are unintentional     Phineas Semen, MD 03/19/18 [phone]

## 2018-03-20 ENCOUNTER — Inpatient Hospital Stay (HOSPITAL_COMMUNITY)
Admit: 2018-03-20 | Discharge: 2018-03-20 | Disposition: A | Discharge status: Home / Self Care | Payer: Self-pay | Attending: Internal Medicine | Admitting: Internal Medicine

## 2018-03-20 ENCOUNTER — Telehealth: Payer: Self-pay | Admitting: Cardiovascular Disease

## 2018-03-20 ENCOUNTER — Encounter: Payer: Self-pay | Admitting: Physician Assistant

## 2018-03-20 DIAGNOSIS — I340 Nonrheumatic mitral (valve) insufficiency: Secondary | ICD-10-CM

## 2018-03-20 DIAGNOSIS — I4891 Unspecified atrial fibrillation: Secondary | ICD-10-CM

## 2018-03-20 DIAGNOSIS — R079 Chest pain, unspecified: Secondary | ICD-10-CM

## 2018-03-20 DIAGNOSIS — F1410 Cocaine abuse, uncomplicated: Secondary | ICD-10-CM

## 2018-03-20 DIAGNOSIS — F1010 Alcohol abuse, uncomplicated: Secondary | ICD-10-CM

## 2018-03-20 LAB — URINE DRUG SCREEN, QUALITATIVE (ARMC ONLY)
Amphetamines, Ur Screen: NOT DETECTED
Barbiturates, Ur Screen: NOT DETECTED
Benzodiazepine, Ur Scrn: NOT DETECTED
CANNABINOID 50 NG, UR ~~LOC~~: NOT DETECTED
Cocaine Metabolite,Ur ~~LOC~~: POSITIVE — AB
MDMA (ECSTASY) UR SCREEN: NOT DETECTED
Methadone Scn, Ur: NOT DETECTED
Opiate, Ur Screen: NOT DETECTED
Phencyclidine (PCP) Ur S: NOT DETECTED
Tricyclic, Ur Screen: NOT DETECTED

## 2018-03-20 LAB — BASIC METABOLIC PANEL
Anion gap: 3 — ABNORMAL LOW (ref 5–15)
BUN: 6 mg/dL (ref 6–20)
CHLORIDE: 107 mmol/L (ref 101–111)
CO2: 26 mmol/L (ref 22–32)
CREATININE: 0.73 mg/dL (ref 0.61–1.24)
Calcium: 7.9 mg/dL — ABNORMAL LOW (ref 8.9–10.3)
GFR calc Af Amer: >60 mL/min (ref >60)
GFR calc non Af Amer: >60 mL/min (ref >60)
GLUCOSE: 130 mg/dL — AB (ref 65–99)
Potassium: 3.6 mmol/L (ref 3.5–5.1)
SODIUM: 136 mmol/L (ref 135–145)

## 2018-03-20 LAB — CBC
HEMATOCRIT: 43.8 % (ref 40.0–52.0)
HEMOGLOBIN: 15.1 g/dL (ref 13.0–18.0)
MCH: 31.9 pg (ref 26.0–34.0)
MCHC: 34.4 g/dL (ref 32.0–36.0)
MCV: 92.6 fL (ref 80.0–100.0)
Platelets: 205 10*3/uL (ref 150–440)
RBC: 4.73 MIL/uL (ref 4.40–5.90)
RDW: 14.4 % (ref 11.5–14.5)
WBC: 4.1 10*3/uL (ref 3.8–10.6)

## 2018-03-20 LAB — ECHOCARDIOGRAM COMPLETE
HEIGHTINCHES: 67 in
WEIGHTICAEL: 2968 [oz_av]

## 2018-03-20 LAB — TROPONIN I
TROPONIN I: 0.03 ng/mL — AB (ref <0.03)
Troponin I: <0.03 ng/mL (ref <0.03)

## 2018-03-20 LAB — LIPID PANEL
CHOL/HDL RATIO: 3 ratio
Cholesterol: 123 mg/dL (ref 0–200)
HDL: 41 mg/dL (ref >40)
LDL CALC: 62 mg/dL (ref 0–99)
TRIGLYCERIDES: 99 mg/dL (ref <150)
VLDL: 20 mg/dL (ref 0–40)

## 2018-03-20 LAB — HEMOGLOBIN A1C
Hgb A1c MFr Bld: 5.4 % (ref 4.8–5.6)
Mean Plasma Glucose: 108.28 mg/dL

## 2018-03-20 LAB — TSH: TSH: 0.366 u[IU]/mL (ref 0.350–4.500)

## 2018-03-20 MED ORDER — POTASSIUM CHLORIDE CRYS ER 20 MEQ PO TBCR: 40.0000 meq | EXTENDED_RELEASE_TABLET | Freq: Once | ORAL | Status: DC

## 2018-03-20 NOTE — Progress Notes (Signed)
Patient alert and oriented, vss, no complaints of pain.  NSR on monitor.  D/c home with girlfriend.  D/c tele and PIV.

## 2018-03-20 NOTE — Progress Notes (Signed)
No new medications.  F/u with [name] next week.  Given information about chest pain and afib and encouraged to stop doing cocaine.    Escorted out of hospital via wheelchair by volunteers.

## 2018-03-20 NOTE — Progress Notes (Signed)
Pt converted to NSR at 0002, no more c/o chest pain or anxiety episodes. Pt does c/o right shoulder and arm pain and states that that is chronic. Earlier on shift, Pt stated that over the weekend he celebrated his birthday and had alcohol and used cocaine Saturday and Sunday, MD willis made aware and urine was obtained for a UDS.

## 2018-03-20 NOTE — Telephone Encounter (Signed)
Patient currently admitted at this time. 

## 2018-03-20 NOTE — Progress Notes (Signed)
Pharmacy Electrolyte Monitoring Consult:  Pharmacy consulted to assist in monitoring and replacing electrolytes in this 47 y.o. male admitted on 03/19/2018 with Chest Pain and Palpitations   Labs:  Sodium (mmol/L)  Date Value  03/20/2018 136   Potassium (mmol/L)  Date Value  03/20/2018 3.6   Magnesium (mg/dL)  Date Value  16/08/9603 2.0   Calcium (mg/dL)  Date Value  54/07/8118 7.9 (L)   Albumin (g/dL)  Date Value  14/78/2956 4.2    Assessment/Plan:  K 3.6   No supplementation at this time. Will f/u with am labs  Aidon Klemens A 03/20/2018 8:17 AM

## 2018-03-20 NOTE — Consult Note (Signed)
Cardiology Consultation:   [name]; [id number]; [birth year]   Admit date: 03/19/2018 Date of Consult: 03/20/2018  Primary Care Provider: Patient, No Pcp Per Primary Cardiologist: new to [facility] - consult by Gollan   Patient Profile:   Bruce [name] is a 47 y.o. male with a hx of polysubstance abuse with ongoing cocaine, alcohol, and tobacco abuse, and hepatitis C stemming from IV drug abuse who is being seen today for the evaluation of new onset Afib with RVR at the request of Dr. Juliene Pina.  History of Present Illness:   Bruce [name] has no previously known cardiac history. Last CPE was in ~ 2016 while he was incarcerated. He reports hepatitis C stemming from his dad giving him IV drugs. He reports he drinks typically a 6 pack of beer per week and only smokes when he drinks. However, on 03/17/18 he was celebrating his birthday and went on a binge drinking weekend with friends. While celebrating, he was using cocaine on a daily basis. Shortly after waking up on 03/19/18 he developed sudden onset of palpitations with associated chest pain, nausea, and vomiting. He has never had similar symptoms. There was also some associated dizziness and chest tightness. Symptoms lasted ~ 2-3 hours prior to him deciding to come to the ED.   Upon the patient's arrival to [facility] he was found to be in new onset Afib with RVR with heart rates in the 130s bpm. BP stable. EKG showed Afib with RVR with heart rates in the 130s bpm, CXR showed possible atelectasis. Labs showed troponin 0.03--><0.03 x 2, TSH normal, urine drug screen positive for cocaine, magnesium 2.0, K+ 3.0 s/p 40 mEq of KCl at admission, SCr 0.94, glucose 132, WBC 3.7, HGB 17.8. He was given IV Cardizem, Cardizem CD 120 mg PO followed by being placed on a diltiazem gtt. He converted to sinus rhythm at 12:01 AM on 03/20/18. Echo is pending. Currently, symptom free.   Past Medical History:  Diagnosis Date  . Dental caries   . Hepatitis C  infection   . Iritis   . PAF (paroxysmal atrial fibrillation) (HCC)    a. diagnosed 03/19/18; b. CHADS2VASc 0  . Polysubstance abuse (HCC)    a. cocaine, tobacco, etoh  . Scrotal mass   . Tick borne fever   . Uveitis     Past Surgical History:  Procedure Laterality Date  . FINGER SURGERY     infection, middle left  . fractured jaw    . SHOULDER SURGERY     right     Home Meds: Prior to Admission medications   Not on File    Inpatient Medications: Scheduled Meds: . diltiazem  120 mg Oral Daily  . enoxaparin (LOVENOX) injection  40 mg Subcutaneous Q24H  . folic acid  1 mg Oral Daily  . LORazepam  0-4 mg Oral Q6H   Followed by  . [START ON 03/21/2018] LORazepam  0-4 mg Oral Q12H  . multivitamin with minerals  1 tablet Oral Daily  . thiamine  100 mg Oral Daily   Or  . thiamine  100 mg Intravenous Daily   Continuous Infusions: . sodium chloride 100 mL/hr at 03/19/18 2047  . diltiazem (CARDIZEM) infusion Stopped (03/20/18 0518)   PRN Meds: acetaminophen **OR** acetaminophen, LORazepam **OR** LORazepam, metoprolol tartrate, ondansetron **OR** ondansetron (ZOFRAN) IV, oxyCODONE, senna-docusate  Allergies:   Allergies  Allergen Reactions  . Penicillins Other (See Comments)    Childhood reaction    Social History:  Social History   Socioeconomic History  . Marital status: Single    Spouse name: Not on file  . Number of children: 0  . Years of education: Not on file  . Highest education level: Not on file  Occupational History    Employer: UNEMPLOYE  Social Needs  . Financial resource strain: Not on file  . Food insecurity:    Worry: Not on file    Inability: Not on file  . Transportation needs:    Medical: Not on file    Non-medical: Not on file  Tobacco Use  . Smoking status: Current Some Day Smoker    Types: Cigarettes  . Smokeless tobacco: Never Used  Substance and Sexual Activity  . Alcohol use: Yes    Alcohol/week: 0.0 oz    Comment: 6 pack per  week  . Drug use: Yes    Types: IV, Cocaine  . Sexual activity: Not on file  Lifestyle  . Physical activity:    Days per week: Not on file    Minutes per session: Not on file  . Stress: Not on file  Relationships  . Social connections:    Talks on phone: Not on file    Gets together: Not on file    Attends religious service: Not on file    Active member of club or organization: Not on file    Attends meetings of clubs or organizations: Not on file    Relationship status: Not on file  . Intimate partner violence:    Fear of current or ex partner: Not on file    Emotionally abused: Not on file    Physically abused: Not on file    Forced sexual activity: Not on file  Other Topics Concern  . Not on file  Social History Narrative  . Not on file     Family History:   Family History  Problem Relation Age of Onset  . Drug abuse Father    Otherwise, he does not know his family history ("I stay to myself.")  ROS:  Review of Systems  Constitutional: Positive for malaise/fatigue. Negative for chills, diaphoresis, fever and weight loss.  HENT: Negative for congestion.   Eyes: Negative for discharge and redness.  Respiratory: Negative for cough, hemoptysis, sputum production, shortness of breath and wheezing.   Cardiovascular: Negative for chest pain, palpitations, orthopnea, claudication, leg swelling and PND.  Gastrointestinal: Negative for abdominal pain, blood in stool, heartburn, melena, nausea and vomiting.  Genitourinary: Negative for hematuria.  Musculoskeletal: Negative for falls and myalgias.  Skin: Negative for rash.  Neurological: Positive for weakness. Negative for dizziness, tingling, tremors, sensory change, speech change, focal weakness and loss of consciousness.  Endo/Heme/Allergies: Does not bruise/bleed easily.  Psychiatric/Behavioral: Negative for substance abuse. The patient is not nervous/anxious.   All other systems reviewed and are negative.     Physical  Exam/Data:   Vitals:   03/19/18 2027 03/19/18 2322 03/20/18 0259 03/20/18 0500  BP: 113/85 104/70 105/71   Pulse: 100 85 71   Resp: 17  19   Temp: 98.5 F (36.9 C) 98.5 F (36.9 C) 98 F (36.7 C)   TempSrc: Oral  Oral   SpO2: 100%  97%   Weight:    185 lb 8 oz (84.1 kg)  Height:        Intake/Output Summary (Last 24 hours) at 03/20/2018 0800 Last data filed at 03/20/2018 0223 Gross per 24 hour  Intake 2563.8 ml  Output 100 ml  Net  2463.8 ml   Filed Weights   03/19/18 1501 03/20/18 0500  Weight: 180 lb (81.6 kg) 185 lb 8 oz (84.1 kg)   Body mass index is 29.05 kg/m.   Physical Exam: General: Well developed, well nourished, in no acute distress. Head: Normocephalic, atraumatic, sclera non-icteric, no xanthomas, nares without discharge.  Neck: Negative for carotid bruits. JVD not elevated. Lungs: Clear bilaterally to auscultation without wheezes, rales, or rhonchi. Breathing is unlabored. Heart: RRR with S1 S2. No murmurs, rubs, or gallops appreciated. Abdomen: Soft, non-tender, non-distended with normoactive bowel sounds. No hepatomegaly. No rebound/guarding. No obvious abdominal masses. Msk:  Strength and tone appear normal for age. Extremities: No clubbing or cyanosis. No edema. Distal pedal pulses are 2+ and equal bilaterally. Neuro: Alert and oriented X 3. No facial asymmetry. No focal deficit. Moves all extremities spontaneously. Psych:  Responds to questions appropriately with a normal affect.   EKG:  The EKG was personally reviewed and demonstrates: Afib with RVR, 137 bpm, occasional PVCs, nonspecific st/t changes Telemetry:  Telemetry was personally reviewed and demonstrates: converted to NSR at 12:01 AM on 5/14, has remained in sinus rhythm with rates from the 50s to 80s bpm since  Weights: Filed Weights   03/19/18 1501 03/20/18 0500  Weight: 180 lb (81.6 kg) 185 lb 8 oz (84.1 kg)    Relevant CV Studies: TTE pending  Laboratory Data:  Chemistry Recent  Labs  Lab 03/19/18 1532  NA 138  K 3.0*  CL 100*  CO2 25  GLUCOSE 132*  BUN 7  CREATININE 0.94  CALCIUM 8.8*  GFRNONAA >60  GFRAA >60  ANIONGAP 13    Recent Labs  Lab 03/19/18 1532  PROT 8.6*  ALBUMIN 4.2  AST 80*  ALT 54  ALKPHOS 89  BILITOT 1.2   Hematology Recent Labs  Lab 03/19/18 1532  WBC 3.7*  RBC 5.65  HGB 17.8  HCT 50.8  MCV 89.9  MCH 31.6  MCHC 35.1  RDW 14.3  PLT 241   Cardiac Enzymes Recent Labs  Lab 03/19/18 1532 03/19/18 2130 03/20/18 0248  TROPONINI 0.03* <0.03 <0.03   No results for input(s): TROPIPOC in the last 168 hours.  BNPNo results for input(s): BNP, PROBNP in the last 168 hours.  DDimer No results for input(s): DDIMER in the last 168 hours.  Radiology/Studies:  Dg Chest Portable 1 View  Result Date: 03/19/2018 IMPRESSION: Streaky retrocardiac opacity with low volumes favoring atelectasis. Electronically Signed   By: Marnee Spring M.D.   On: 03/19/2018 17:33    Assessment and Plan:   1. New onset Afib with RVR: -Converted to sinus rhythm at 12:01 AM on 03/20/18 and has remained in sinus since -Likely in the setting of binge drinking and cocaine abuse as well as hypokalemia -Continue rate control with Cardizem CD 120 mg daily  -CHADS2VASc 0, no role for ASA or DOAC at this time -Replete potassium as below -TSH and magnesium ok -TTE pending  2. Polysubstance abuse: -Patient is positive for cocaine abuse as well as has a history of tobacco and alcohol abuse -Cessation is advised -CIWA protocol per IM  3. Hepatitis C: -Per IM  4. Hyperglycemia: -Check A1c -Check lipid panel  5. Hypokalemia: -Replete to goal > 4.0 -Check bmet this morning following KCl repletion at time of admission   6. Elevated troponin: -Initial troponin minimally elevated at 0.03 with subsequent values being negative -Likely supply demand ischemia in the setting of cocaine abuse and Afib with RVR -Consider outpatient  Myoview -TTE  pending -Currently, chest pain free    For questions or updates, please contact [facility] Please consult [email or web address] for contact info under Cardiology/STEMI.   Signed, [name], PA-C [facility] Pager: [phone] 03/20/2018, 8:00 AM

## 2018-03-20 NOTE — Discharge Summary (Signed)
[facility] - [place] at [facility]   PATIENT NAME: Bruce [name]    MR#:  [id number]  DATE OF BIRTH:  [birth year]  DATE OF ADMISSION:  03/19/2018 ADMITTING PHYSICIAN: Adrian Saran, MD  DATE OF DISCHARGE: 03/20/2018  PRIMARY CARE PHYSICIAN: Patient, No Pcp Per    ADMISSION DIAGNOSIS:  Hypokalemia [E87.6] Atrial fibrillation with RVR (HCC) [I48.91] Chest pain, unspecified type [R07.9]  DISCHARGE DIAGNOSIS:  Active Problems:   Atrial fibrillation (HCC)   SECONDARY DIAGNOSIS:   Past Medical History:  Diagnosis Date  . Dental caries   . Hepatitis C infection   . Iritis   . PAF (paroxysmal atrial fibrillation) (HCC)    a. diagnosed 03/19/18; b. CHADS2VASc 0  . Polysubstance abuse (HCC)    a. cocaine, tobacco, etoh  . Scrotal mass   . Tick borne fever   . Uveitis     HOSPITAL COURSE:   47 year old male with hepatitis C who presented with chest pain and found to have new onset atrial fibrillation.  1.  New onset atrial fibrillation: Patient converted to normal sinus rhythm.  Etiology of atrial fibrillation was due to EtOH and cocaine abuse.  Patient was eval by cardiology.  Echocardiogram showed normal ejection fraction without valvular abnormalities.  He was initially placed on diltiazem drip and oral diltiazem.  I spoke with the cardiologist and they are recommending no medications at this time upon discharge because his blood pressure is low/normal.  He is highly encouraged not to indulge in EtOH binging and cocaine.  2.  Hepatitis C  3.  Chest pain: Patient ruled out for ACS.  Chest pain was due to palpitations from atrial fibrillation  4.  Tobacco dependence: Patient was counseled upon admission.   DISCHARGE CONDITIONS AND DIET:   Stable for discharge on regular diet  CONSULTS OBTAINED:  Treatment Team:  Antonieta Iba, MD  DRUG ALLERGIES:   Allergies  Allergen Reactions  . Penicillins Other (See Comments)    Childhood reaction     DISCHARGE MEDICATIONS:   Allergies as of 03/20/2018      Reactions   Penicillins Other (See Comments)   Childhood reaction      Medication List    You have not been prescribed any medications.       Today   CHIEF COMPLAINT:  No acute issues overnight.  Doing well this morning and ready for discharge   VITAL SIGNS:  Blood pressure 110/73, pulse 65, temperature 97.7 F (36.5 C), temperature source Oral, resp. rate 19, height 5\' 7"  (1.702 m), weight 84.1 kg (185 lb 8 oz), SpO2 96 %.   REVIEW OF SYSTEMS:  Review of Systems  Constitutional: Negative.  Negative for chills, fever and malaise/fatigue.  HENT: Negative.  Negative for ear discharge, ear pain, hearing loss, nosebleeds and sore throat.   Eyes: Negative.  Negative for blurred vision and pain.  Respiratory: Negative.  Negative for cough, hemoptysis, shortness of breath and wheezing.   Cardiovascular: Negative.  Negative for chest pain, palpitations and leg swelling.  Gastrointestinal: Negative.  Negative for abdominal pain, blood in stool, diarrhea, nausea and vomiting.  Genitourinary: Negative.  Negative for dysuria.  Musculoskeletal: Negative.  Negative for back pain.  Skin: Negative.   Neurological: Negative for dizziness, tremors, speech change, focal weakness, seizures and headaches.  Endo/Heme/Allergies: Negative.  Does not bruise/bleed easily.  Psychiatric/Behavioral: Negative.  Negative for depression, hallucinations and suicidal ideas.     PHYSICAL EXAMINATION:  GENERAL:  47 y.o.-year-old patient lying in  the bed with no acute distress.  NECK:  Supple, no jugular venous distention. No thyroid enlargement, no tenderness.  LUNGS: Normal breath sounds bilaterally, no wheezing, rales,rhonchi  No use of accessory muscles of respiration.  CARDIOVASCULAR: S1, S2 normal. No murmurs, rubs, or gallops.  ABDOMEN: Soft, non-tender, non-distended. Bowel sounds present. No organomegaly or mass.  EXTREMITIES: No  pedal edema, cyanosis, or clubbing.  PSYCHIATRIC: The patient is alert and oriented x 3.  SKIN: No obvious rash, lesion, or ulcer.   DATA REVIEW:   CBC Recent Labs  Lab 03/20/18 0248  WBC 4.1  HGB 15.1  HCT 43.8  PLT 205    Chemistries  Recent Labs  Lab 03/19/18 1532 03/20/18 0248  NA 138 136  K 3.0* 3.6  CL 100* 107  CO2 25 26  GLUCOSE 132* 130*  BUN 7 6  CREATININE 0.94 0.73  CALCIUM 8.8* 7.9*  MG 2.0  --   AST 80*  --   ALT 54  --   ALKPHOS 89  --   BILITOT 1.2  --     Cardiac Enzymes Recent Labs  Lab 03/19/18 2130 03/20/18 0248 03/20/18 0842  TROPONINI <0.03 <0.03 0.03*    Microbiology Results  @MICRORSLT48 @  RADIOLOGY:  Dg Chest Portable 1 View  Result Date: 03/19/2018 CLINICAL DATA:  Chest pain this morning with nausea. EXAM: PORTABLE CHEST 1 VIEW COMPARISON:  05/01/2012 FINDINGS: Borderline heart size accentuated by technique. Negative aortic and hilar contours. Streaky retrocardiac opacity. No edema, effusion, or air bronchogram. IMPRESSION: Streaky retrocardiac opacity with low volumes favoring atelectasis. Electronically Signed   By: Marnee Spring M.D.   On: 03/19/2018 17:33      Allergies as of 03/20/2018      Reactions   Penicillins Other (See Comments)   Childhood reaction      Medication List    You have not been prescribed any medications.         Management plans discussed with the patient and he is in agreement. Stable for discharge home  Patient should follow up with cardiology  CODE STATUS:     Code Status Orders  (From admission, onward)        Start     Ordered   03/19/18 2028  Full code  Continuous     03/19/18 2027    Code Status History    This patient has a current code status but no historical code status.      TOTAL TIME TAKING CARE OF THIS PATIENT: 38 minutes.    Note: This dictation was prepared with Dragon dictation along with smaller phrase technology. Any transcriptional errors that result  from this process are unintentional.  Maniya Donovan M.D on 03/20/2018 at 10:17 AM  Between 7am to 6pm - Pager - [phone] After 6pm go to [email or web address] - password [email or web address]  [facility]  Office  [phone]  CC: Primary care physician; Patient, No Pcp Per

## 2018-03-20 NOTE — Telephone Encounter (Signed)
TCM....  Patient is being discharged later today    They saw [name]  They are scheduled to see [name] on 03/29/18  They were seen for Afib  They need to be seen within 1 week   Please call

## 2018-03-20 NOTE — Progress Notes (Signed)
*  PRELIMINARY RESULTS* Echocardiogram 2D Echocardiogram has been performed.  Bruce [name] 03/20/2018, 9:24 AM

## 2018-03-21 LAB — HIV ANTIBODY (ROUTINE TESTING W REFLEX): HIV Screen 4th Generation wRfx: NONREACTIVE

## 2018-03-21 NOTE — Telephone Encounter (Signed)
I attempted to call the patient. I left a message at his home # to call back. I attempted to call the patient at his cell # but voice mail is not set up.

## 2018-03-22 ENCOUNTER — Emergency Department: Payer: Self-pay

## 2018-03-22 ENCOUNTER — Emergency Department
Admission: EM | Admit: 2018-03-22 | Discharge: 2018-03-22 | Disposition: A | Discharge status: Home / Self Care | Payer: Self-pay | Attending: Emergency Medicine | Admitting: Emergency Medicine

## 2018-03-22 DIAGNOSIS — R0602 Shortness of breath: Secondary | ICD-10-CM | POA: Insufficient documentation

## 2018-03-22 DIAGNOSIS — R002 Palpitations: Secondary | ICD-10-CM | POA: Insufficient documentation

## 2018-03-22 DIAGNOSIS — Z8679 Personal history of other diseases of the circulatory system: Secondary | ICD-10-CM | POA: Insufficient documentation

## 2018-03-22 DIAGNOSIS — F17210 Nicotine dependence, cigarettes, uncomplicated: Secondary | ICD-10-CM | POA: Insufficient documentation

## 2018-03-22 LAB — CBC
HEMATOCRIT: 42.6 % (ref 40.0–52.0)
Hemoglobin: 15.2 g/dL (ref 13.0–18.0)
MCH: 32.3 pg (ref 26.0–34.0)
MCHC: 35.7 g/dL (ref 32.0–36.0)
MCV: 90.7 fL (ref 80.0–100.0)
PLATELETS: 214 10*3/uL (ref 150–440)
RBC: 4.7 MIL/uL (ref 4.40–5.90)
RDW: 14.3 % (ref 11.5–14.5)
WBC: 5.2 10*3/uL (ref 3.8–10.6)

## 2018-03-22 LAB — BASIC METABOLIC PANEL
Anion gap: 8 (ref 5–15)
BUN: 7 mg/dL (ref 6–20)
CO2: 25 mmol/L (ref 22–32)
CREATININE: 0.81 mg/dL (ref 0.61–1.24)
Calcium: 9.1 mg/dL (ref 8.9–10.3)
Chloride: 104 mmol/L (ref 101–111)
GFR calc Af Amer: >60 mL/min (ref >60)
Glucose, Bld: 109 mg/dL — ABNORMAL HIGH (ref 65–99)
POTASSIUM: 3.4 mmol/L — AB (ref 3.5–5.1)
SODIUM: 137 mmol/L (ref 135–145)

## 2018-03-22 LAB — TROPONIN I

## 2018-03-22 NOTE — Telephone Encounter (Signed)
I attempted to call the patient. I left a message at his home # to call back- person identified on the voice mail is a male. I attempted to call the patient at his cell # but voice mail is not set up.

## 2018-03-22 NOTE — ED Triage Notes (Signed)
Patient c/o medial chest pain beginning 2 hours ago. Patient reports that he felt like his heart was racing. Patient was just discharged from this hospital on Tuesday for tachycardia.

## 2018-03-22 NOTE — ED Provider Notes (Signed)
[facility] Emergency Department Provider Note  ____________________________________________   First MD Initiated Contact with Patient 03/22/18 [phone]     (approximate)  I have reviewed the triage vital signs and the nursing notes.   HISTORY  Chief Complaint Chest Pain   HPI Bruce [name] is a 47 y.o. male who self presents to the emergency department with a brief episode of palpitations that began this evening while in bed.  He said it lasted about an hour or 2.  He became concerned because he was recently admitted to the hospital for atrial fibrillation with rapid ventricular response.  This was felt to be secondary to alcohol and cocaine abuse.  The patient reports not he has been sober from cocaine in the past several days.  His palpitations have subsequently resolved but he was concerned so he came to the emergency department.  His palpitations came on suddenly lasted about 2 hours and then went away quickly on their own.  Associated with sharp upper chest pain.  Nonexertional.  Mild shortness of breath.  Past Medical History:  Diagnosis Date  . Dental caries   . Hepatitis C infection   . Iritis   . PAF (paroxysmal atrial fibrillation) (HCC)    a. diagnosed 03/19/18; b. CHADS2VASc 0  . Polysubstance abuse (HCC)    a. cocaine, tobacco, etoh  . Scrotal mass   . Tick borne fever   . Uveitis     Patient Active Problem List   Diagnosis Date Noted  . Atrial fibrillation (HCC) 03/19/2018  . Abnormality of gait 02/12/2013  . Mandibular fracture (HCC) 12/25/2012  . Flu vaccine need 12/25/2012  . Scrotal mass 05/22/2012  . Dental caries 05/22/2012  . Hepatitis C infection 05/16/2012  . Myalgia 05/06/2012  . Arthralgia 05/06/2012  . Tick borne fever, likely 05/05/2012  . Tinea cruris, likely 05/05/2012  . Spermatocele of epididymis 05/01/2012  . Bilateral varicoceles 05/01/2012  . Bilateral hydrocele 05/01/2012    Past Surgical History:  Procedure  Laterality Date  . FINGER SURGERY     infection, middle left  . fractured jaw    . SHOULDER SURGERY     right    Prior to Admission medications   Not on File    Allergies Penicillins  Family History  Problem Relation Age of Onset  . Drug abuse Father     Social History Social History   Tobacco Use  . Smoking status: Current Some Day Smoker    Types: Cigarettes  . Smokeless tobacco: Never Used  Substance Use Topics  . Alcohol use: Yes    Alcohol/week: 0.0 oz    Comment: 6 pack per week  . Drug use: Yes    Types: IV, Cocaine    Review of Systems Constitutional: No fever/chills Eyes: No visual changes. ENT: No sore throat. Cardiovascular: Positive for chest pain. Respiratory: Positive for shortness of breath. Gastrointestinal: No abdominal pain.  No nausea, no vomiting.  No diarrhea.  No constipation. Genitourinary: Negative for dysuria. Musculoskeletal: Negative for back pain. Skin: Negative for rash. Neurological: Negative for headaches, focal weakness or numbness.   ____________________________________________   PHYSICAL EXAM:  VITAL SIGNS: ED Triage Vitals [03/22/18 0330]  Enc Vitals Group     BP 116/78     Pulse Rate 65     Resp 18     Temp 98 F (36.7 C)     Temp Source Oral     SpO2 99 %     Weight 185  lb (83.9 kg)     Height      Head Circumference      Peak Flow      Pain Score 6     Pain Loc      Pain Edu?      Excl. in GC?     Constitutional: Alert and oriented x4 well-appearing nontoxic no diaphoresis speaks moccasins Eyes: PERRL EOMI. Head: Atraumatic. Nose: No congestion/rhinnorhea. Mouth/Throat: No trismus Neck: No stridor.   Cardiovascular: Normal rate, regular rhythm. Grossly normal heart sounds.  Good peripheral circulation. Respiratory: Normal respiratory effort.  No retractions. Lungs CTAB and moving good air Gastrointestinal: Soft nontender Musculoskeletal: No lower extremity edema   Neurologic:  Normal speech and  language. No gross focal neurologic deficits are appreciated. Skin:  Skin is warm, dry and intact. No rash noted. Psychiatric: Mood and affect are normal. Speech and behavior are normal.    ____________________________________________   DIFFERENTIAL includes but not limited to  Atrial fibrillation, dehydration, ventricular tachycardia, cocaine abuse ____________________________________________   LABS (all labs ordered are listed, but only abnormal results are displayed)  Labs Reviewed  BASIC METABOLIC PANEL - Abnormal; Notable for the following components:      Result Value   Potassium 3.4 (*)    Glucose, Bld 109 (*)    All other components within normal limits  CBC  TROPONIN I    Lab work reviewed by me with no acute disease __________________________________________  EKG  ED ECG REPORT I, Merrily Brittle, the attending physician, personally viewed and interpreted this ECG.  Date: 03/22/2018 EKG Time:  Rate: 63 Rhythm: normal sinus rhythm QRS Axis: normal Intervals: normal ST/T Wave abnormalities: normal Narrative Interpretation: no evidence of acute ischemia  ____________________________________________  RADIOLOGY  Chest x-ray reviewed by me with no acute disease ____________________________________________   PROCEDURES  Procedure(s) performed: no  Procedures  Critical Care performed: no  Observation: no ____________________________________________   INITIAL IMPRESSION / ASSESSMENT AND PLAN / ED COURSE  Pertinent labs & imaging results that were available during my care of the patient were reviewed by me and considered in my medical decision making (see chart for details).  The patient arrives in normal sinus rhythm asymptomatic and appropriate.  Given reassurance and I have encouraged him to remain abstinent from cocaine.  He has stress test scheduled in 1 week and have encouraged him to keep that follow-up.  Strict return precautions have been  given to the patient verbalized understanding and agreement with plan.      ____________________________________________   FINAL CLINICAL IMPRESSION(S) / ED DIAGNOSES  Final diagnoses:  Palpitations      NEW MEDICATIONS STARTED DURING THIS VISIT:  There are no discharge medications for this patient.    Note:  This document was prepared using Dragon voice recognition software and may include unintentional dictation errors.     Merrily Brittle, MD 03/24/18 1153

## 2018-03-22 NOTE — Discharge Instructions (Signed)
It was a pleasure to take care of you today, and thank you for coming to our emergency department.  If you have any questions or concerns before leaving please ask the nurse to grab me and I'm more than happy to go through your aftercare instructions again.  If you were prescribed any opioid pain medication today such as Norco, Vicodin, Percocet, morphine, hydrocodone, or oxycodone please make sure you do not drive when you are taking this medication as it can alter your ability to drive safely.  If you have any concerns once you are home that you are not improving or are in fact getting worse before you can make it to your follow-up appointment, please do not hesitate to call 911 and come back for further evaluation.  Merrily Brittle, MD  Results for orders placed or performed during the hospital encounter of 03/22/18  Basic metabolic panel  Result Value Ref Range   Sodium 137 135 - 145 mmol/L   Potassium 3.4 (L) 3.5 - 5.1 mmol/L   Chloride 104 101 - 111 mmol/L   CO2 25 22 - 32 mmol/L   Glucose, Bld 109 (H) 65 - 99 mg/dL   BUN 7 6 - 20 mg/dL   Creatinine, Ser 0.86 0.61 - 1.24 mg/dL   Calcium 9.1 8.9 - 57.8 mg/dL   GFR calc non Af Amer >60 >60 mL/min   GFR calc Af Amer >60 >60 mL/min   Anion gap 8 5 - 15  CBC  Result Value Ref Range   WBC 5.2 3.8 - 10.6 K/uL   RBC 4.70 4.40 - 5.90 MIL/uL   Hemoglobin 15.2 13.0 - 18.0 g/dL   HCT 46.9 62.9 - 52.8 %   MCV 90.7 80.0 - 100.0 fL   MCH 32.3 26.0 - 34.0 pg   MCHC 35.7 32.0 - 36.0 g/dL   RDW 41.3 24.4 - 01.0 %   Platelets 214 150 - 440 K/uL  Troponin I  Result Value Ref Range   Troponin I <0.03 <0.03 ng/mL   Dg Chest 2 View  Result Date: 03/22/2018 CLINICAL DATA:  Chest pain EXAM: CHEST - 2 VIEW COMPARISON:  03/19/2018, 05/01/2012 FINDINGS: The heart size and mediastinal contours are within normal limits. Both lungs are clear. The visualized skeletal structures are unremarkable. IMPRESSION: No active cardiopulmonary disease.  Electronically Signed   By: Jasmine Pang M.D.   On: 03/22/2018 03:55   Dg Chest Portable 1 View  Result Date: 03/19/2018 CLINICAL DATA:  Chest pain this morning with nausea. EXAM: PORTABLE CHEST 1 VIEW COMPARISON:  05/01/2012 FINDINGS: Borderline heart size accentuated by technique. Negative aortic and hilar contours. Streaky retrocardiac opacity. No edema, effusion, or air bronchogram. IMPRESSION: Streaky retrocardiac opacity with low volumes favoring atelectasis. Electronically Signed   By: Marnee Spring M.D.   On: 03/19/2018 17:33

## 2018-03-22 NOTE — ED Notes (Signed)
Pt states his heart was racing for about 2 hours earlier tonight and he had stronger chest pain at that time. Presently it has eased off and his heart rate is 62

## 2018-03-23 ENCOUNTER — Telehealth: Payer: Self-pay

## 2018-03-23 NOTE — Telephone Encounter (Signed)
Patient contacted regarding discharge from [facility] on 03/22/18.   Patient understands to follow up with provider ? On 03/29/18 at 0830 at [place].  Patient understands discharge instructions? Yes Patient understands medications and regiment? Yes States he is not on any medications.   Patient understands to bring all medications to this visit? Not on any medications

## 2018-03-23 NOTE — Telephone Encounter (Signed)
EMMI Follow-up: Was noted on the report that patient had a question about who to follow-up with in condition changes.  Left a VM for Mr. Corkum to call me at his convenience.

## 2018-03-27 NOTE — Progress Notes (Deleted)
Cardiology Office Note Date:  03/27/2018  Patient ID:  Bruce [name], Bruce [name] 27-Apr-1971, MRN [id number] PCP:  Patient, No Pcp Per  Cardiologist:  Dr. Mariah Milling, MD  ***refresh   Chief Complaint: Hospital follow-up  History of Present Illness: Bruce [name] is a 47 y.o. male with history of recently diagnosed A. fib as detailed below on ***, polysubstance abuse with ongoing cocaine, alcohol, and tobacco, and hepatitis C stemming from IV drug abuse who presents for hospital follow-up after recent admission to [facility] from 5/13 through 5/14 for new onset A. fib with RVR.  Prior to the above admission, the patient did not have any previously known cardiac history.  Patient reports hepatitis C stemming from his father giving him IV drugs.  He reported at baseline he typically drinks approximately a sixpack of beer per week and only smokes when he drinks. However, on 03/17/2018 he was celebrating his birthday and went on a binge drinking week and with friends.  While celebrating, he was using cocaine on a daily basis.  Shortly after waking up on 03/19/2018 he developed sudden onset of palpitations with associated chest pain, nausea, and vomiting.  Upon his arrival to [facility] he was noted to be in new onset A. fib with RVR with heart rates into the 130s BPM.  Cardiac enzymes showed a troponin of 0.03 trending to less than 0.032, TSH normal, urine drug screen positive for cocaine, magnesium 2.0, initial potassium 3.0, hemoglobin A1c 5.4, LDL 62.  He was placed on a Cardizem drip for rate control and converted to sinus rhythm at 12:01 AM on 03/20/2018.  Echo showed EF of 60 to 65%, normal wall motion, grade 2 diastolic dysfunction, mild MR, left atrium normal in size, RVSF normal, PASP normal.  He remained in sinus rhythm for the remainder of his admission.  Given it was felt this episode of A. fib was in the setting of cocaine abuse, alcohol abuse, and hypokalemia with associated vomiting he was not placed on long-term, full  dose anticoagulation at that time.  CHADS2VASc of 0. There was also some concern regarding the patient being able to tolerate long-acting diltiazem secondary to relative hypotension with systolic blood pressures in the low 100s.  He was not discharged on any medications.  Discharge labs show a potassium of 3.4, serum creatinine 0.81, unremarkable CBC.   Past Medical History:  Diagnosis Date  . Dental caries   . Hepatitis C infection   . Iritis   . PAF (paroxysmal atrial fibrillation) (HCC)    a. diagnosed 03/19/18; b. CHADS2VASc 0  . Polysubstance abuse (HCC)    a. cocaine, tobacco, etoh  . Scrotal mass   . Tick borne fever   . Uveitis     Past Surgical History:  Procedure Laterality Date  . FINGER SURGERY     infection, middle left  . fractured jaw    . SHOULDER SURGERY     right    No outpatient medications have been marked as taking for the 03/29/18 encounter (Appointment) with Sondra Barges, PA-C.    Allergies:   Penicillins   Social History:  The patient  reports that he has been smoking cigarettes.  He has never used smokeless tobacco. He reports that he drinks alcohol. He reports that he has current or past drug history. Drugs: IV and Cocaine.   Family History:  The patient's family history includes Drug abuse in his father.  ROS:   ROS   PHYSICAL EXAM: *** VS:  There  were no vitals taken for this visit. BMI: There is no height or weight on file to calculate BMI.  Physical Exam   EKG:  Was ordered and interpreted by me today. Shows ***  Recent Labs: 03/19/2018: ALT 54; Magnesium 2.0; TSH 0.366 03/22/2018: BUN 7; Creatinine, Ser 0.81; Hemoglobin 15.2; Platelets 214; Potassium 3.4; Sodium 137  03/20/2018: Cholesterol 123; HDL 41; LDL Cholesterol 62; Total CHOL/HDL Ratio 3.0; Triglycerides 99; VLDL 20   Estimated Creatinine Clearance: 114.2 mL/min (by C-G formula based on SCr of 0.81 mg/dL).   Wt Readings from Last 3 Encounters:  03/22/18 185 lb (83.9 kg)    03/20/18 185 lb 8 oz (84.1 kg)  10/06/17 182 lb (82.6 kg)     Other studies reviewed: Additional studies/records reviewed today include: summarized above  ASSESSMENT AND PLAN:  1. PAF: 2. Polysubstance abuse: 3. Hepatitis C: 4. Hypokalemia:  Disposition: F/u with *** in   Current medicines are reviewed at length with the patient today.  The patient did not have any concerns regarding medicines.  Signed, [name], PA-C 03/27/2018 12:18 PM     [facility] [place] [address] [place], [place] [address] [phone]

## 2018-03-29 ENCOUNTER — Ambulatory Visit: Payer: Self-pay | Admitting: Physician Assistant

## 2018-05-19 ENCOUNTER — Emergency Department: Payer: Self-pay

## 2018-05-19 ENCOUNTER — Emergency Department
Admission: EM | Admit: 2018-05-19 | Discharge: 2018-05-20 | Disposition: A | Discharge status: Home / Self Care | Payer: Self-pay | Attending: Emergency Medicine | Admitting: Emergency Medicine

## 2018-05-19 DIAGNOSIS — R61 Generalized hyperhidrosis: Secondary | ICD-10-CM | POA: Insufficient documentation

## 2018-05-19 DIAGNOSIS — Y999 Unspecified external cause status: Secondary | ICD-10-CM | POA: Insufficient documentation

## 2018-05-19 DIAGNOSIS — F17210 Nicotine dependence, cigarettes, uncomplicated: Secondary | ICD-10-CM | POA: Insufficient documentation

## 2018-05-19 DIAGNOSIS — R42 Dizziness and giddiness: Secondary | ICD-10-CM | POA: Insufficient documentation

## 2018-05-19 DIAGNOSIS — Y939 Activity, unspecified: Secondary | ICD-10-CM | POA: Insufficient documentation

## 2018-05-19 DIAGNOSIS — W57XXXA Bitten or stung by nonvenomous insect and other nonvenomous arthropods, initial encounter: Secondary | ICD-10-CM | POA: Insufficient documentation

## 2018-05-19 DIAGNOSIS — R002 Palpitations: Secondary | ICD-10-CM | POA: Insufficient documentation

## 2018-05-19 DIAGNOSIS — Y929 Unspecified place or not applicable: Secondary | ICD-10-CM | POA: Insufficient documentation

## 2018-05-19 DIAGNOSIS — R0602 Shortness of breath: Secondary | ICD-10-CM | POA: Insufficient documentation

## 2018-05-19 DIAGNOSIS — S1096XA Insect bite of unspecified part of neck, initial encounter: Secondary | ICD-10-CM | POA: Insufficient documentation

## 2018-05-19 DIAGNOSIS — Z91030 Bee allergy status: Secondary | ICD-10-CM | POA: Insufficient documentation

## 2018-05-19 DIAGNOSIS — R079 Chest pain, unspecified: Secondary | ICD-10-CM | POA: Insufficient documentation

## 2018-05-19 LAB — CBC
HCT: 49.4 % (ref 40.0–52.0)
Hemoglobin: 17.5 g/dL (ref 13.0–18.0)
MCH: 32.2 pg (ref 26.0–34.0)
MCHC: 35.5 g/dL (ref 32.0–36.0)
MCV: 90.7 fL (ref 80.0–100.0)
PLATELETS: 227 10*3/uL (ref 150–440)
RBC: 5.44 MIL/uL (ref 4.40–5.90)
RDW: 13.5 % (ref 11.5–14.5)
WBC: 4.8 10*3/uL (ref 3.8–10.6)

## 2018-05-19 LAB — BASIC METABOLIC PANEL
Anion gap: 11 (ref 5–15)
BUN: 10 mg/dL (ref 6–20)
CALCIUM: 9.3 mg/dL (ref 8.9–10.3)
CO2: 26 mmol/L (ref 22–32)
CREATININE: 0.91 mg/dL (ref 0.61–1.24)
Chloride: 105 mmol/L (ref 98–111)
GFR calc Af Amer: >60 mL/min (ref >60)
Glucose, Bld: 115 mg/dL — ABNORMAL HIGH (ref 70–99)
Potassium: 3.6 mmol/L (ref 3.5–5.1)
Sodium: 142 mmol/L (ref 135–145)

## 2018-05-19 LAB — TROPONIN I: Troponin I: <0.03 ng/mL (ref <0.03)

## 2018-05-19 NOTE — ED Triage Notes (Signed)
Patient reports he was stung by a bee last neck to posterior neck - patient is allergic to bees. Patient c/o swelling to the area. Patient reports he took 3 benadryls last night, 3 this am, and 3 at approx 1845.  Patient now c/o left chest pain, palpitations, and dizziness, near syncope, SOB. Patient denies N/V. Patient denies radiation.

## 2018-05-20 LAB — TROPONIN I

## 2018-05-20 MED ORDER — METHYLPREDNISOLONE SODIUM SUCC 125 MG IJ SOLR
125.0000 mg | Freq: Once | INTRAMUSCULAR | Status: AC
  Administered 2018-05-20: 125 mg via INTRAVENOUS
  Filled 2018-05-20: qty 2

## 2018-05-20 MED ORDER — SODIUM CHLORIDE 0.9 % IV BOLUS
1000.0000 mL | Freq: Once | INTRAVENOUS | Status: AC
  Administered 2018-05-20: 1000 mL via INTRAVENOUS

## 2018-05-20 NOTE — Discharge Instructions (Addendum)
Please follow-up with your primary care physician for further evaluation of your symptoms.  Please return with any other concerns or any other symptoms.

## 2018-05-20 NOTE — ED Provider Notes (Signed)
[facility] Emergency Department Provider Note   ____________________________________________   First MD Initiated Contact with Patient 05/20/18 0017     (approximate)  I have reviewed the triage vital signs and the nursing notes.   HISTORY  Chief Complaint Chest Pain    HPI Bruce [name] is a 47 y.o. male who comes into the hospital today with some chest tightness.  The patient reports that last night he was stung by a bee.  He has an allergy to bee stings.  He reports that he took 3 doses of Benadryl last night and 3 doses of Benadryl this morning as well as another 3 Benadryl.  He reports that he went to sleep and then when he got up this evening his heart was racing and he felt dizzy.  He felt lightheaded and states that he had some chest pain.  He felt like his heart was racing and he became concerned.  He reports that his symptoms are improved now and his chest pains a 5 out of 10 in intensity.  He reports that his chest will start racing and that it was slow down and it will come and go.  He reports that he ate once this morning and drink 2 sodas but has not had anything since.  He felt sweaty and some shortness of breath.  He said that the back of his neck is still itchy.  He also had some numbness to his hands but he reports that he was breathing fast.  He is here today for evaluation of his symptoms.   Past Medical History:  Diagnosis Date  . Dental caries   . Hepatitis C infection   . Iritis   . PAF (paroxysmal atrial fibrillation) (HCC)    a. diagnosed 03/19/18; b. CHADS2VASc 0  . Polysubstance abuse (HCC)    a. cocaine, tobacco, etoh  . Scrotal mass   . Tick borne fever   . Uveitis     Patient Active Problem List   Diagnosis Date Noted  . Atrial fibrillation (HCC) 03/19/2018  . Abnormality of gait 02/12/2013  . Mandibular fracture (HCC) 12/25/2012  . Flu vaccine need 12/25/2012  . Scrotal mass 05/22/2012  . Dental caries 05/22/2012  .  Hepatitis C infection 05/16/2012  . Myalgia 05/06/2012  . Arthralgia 05/06/2012  . Tick borne fever, likely 05/05/2012  . Tinea cruris, likely 05/05/2012  . Spermatocele of epididymis 05/01/2012  . Bilateral varicoceles 05/01/2012  . Bilateral hydrocele 05/01/2012    Past Surgical History:  Procedure Laterality Date  . FINGER SURGERY     infection, middle left  . fractured jaw    . SHOULDER SURGERY     right    Prior to Admission medications   Not on File    Allergies Penicillins  Family History  Problem Relation Age of Onset  . Drug abuse Father     Social History Social History   Tobacco Use  . Smoking status: Current Some Day Smoker    Types: Cigarettes  . Smokeless tobacco: Never Used  Substance Use Topics  . Alcohol use: Yes    Comment: 6 pack per week  . Drug use: Yes    Types: IV, Cocaine    Review of Systems  Constitutional: No fever/chills Eyes: No visual changes. ENT: No sore throat. Cardiovascular:  Palpitations, chest pain. Respiratory:  shortness of breath. Gastrointestinal: No abdominal pain.  No nausea, no vomiting.  No diarrhea.  No constipation. Genitourinary: Negative for dysuria. Musculoskeletal:  Negative for back pain. Skin: Negative for rash. Neurological: dizziness and lightheadedness   ____________________________________________   PHYSICAL EXAM:  VITAL SIGNS: ED Triage Vitals  Enc Vitals Group     BP 05/19/18 1945 119/85     Pulse Rate 05/19/18 1945 (!) 110     Resp 05/19/18 1945 20     Temp 05/19/18 1945 98.6 F (37 C)     Temp Source 05/19/18 1945 Oral     SpO2 05/19/18 1945 99 %     Weight 05/19/18 1946 187 lb (84.8 kg)     Height 05/19/18 1946 5\' 7"  (1.702 m)     Head Circumference --      Peak Flow --      Pain Score 05/19/18 1945 0     Pain Loc --      Pain Edu? --      Excl. in GC? --     Constitutional: Alert and oriented. Well appearing and in no acute distress. Eyes: Conjunctivae are normal. PERRL.  EOMI. Head: Atraumatic. Nose: No congestion/rhinnorhea. Mouth/Throat: Mucous membranes are moist.  Oropharynx non-erythematous. Cardiovascular: Normal rate, regular rhythm. Grossly normal heart sounds.  Good peripheral circulation. Respiratory: Normal respiratory effort.  No retractions. Lungs CTAB. Gastrointestinal: Soft and nontender. No distention. Positive bowel sounds Musculoskeletal: No lower extremity tenderness nor edema.   Neurologic:  Normal speech and language.  Cranial nerves II through XII are grossly intact with no focal motor neuro deficits. Skin:  Skin is warm, dry and intact.  Psychiatric: Mood and affect are normal.    ____________________________________________   LABS (all labs ordered are listed, but only abnormal results are displayed)  Labs Reviewed  BASIC METABOLIC PANEL - Abnormal; Notable for the following components:      Result Value   Glucose, Bld 115 (*)    All other components within normal limits  CBC  TROPONIN I  TROPONIN I   ____________________________________________  EKG  ED ECG REPORT I, Rebecka Apley, the attending physician, personally viewed and interpreted this ECG.   Date: 05/20/2018  EKG Time: 1944  Rate: 114  Rhythm: sinus tachycardia  Axis: normal  Intervals:none  ST&T Change: none  ____________________________________________  RADIOLOGY  ED MD interpretation:  CXR: Stable chest   Official radiology report(s): Dg Chest 2 View  Result Date: 05/19/2018 CLINICAL DATA:  Stung by a bee in the posterior neck last night. Swelling despite Benadryl. Left chest pain, palpitations and dizziness. EXAM: CHEST - 2 VIEW COMPARISON:  Radiographs 03/22/2018. FINDINGS: The heart size and mediastinal contours are normal. The lungs are clear. There is no pleural effusion or pneumothorax. No acute osseous findings are identified. The visualized soft tissues appear grossly stable. No evidence of airway compromise. IMPRESSION: Stable  chest.  No active cardiopulmonary process. Electronically Signed   By: Carey Bullocks M.D.   On: 05/19/2018 20:16    ____________________________________________   PROCEDURES  Procedure(s) performed: None  Procedures  Critical Care performed: No  ____________________________________________   INITIAL IMPRESSION / ASSESSMENT AND PLAN / ED COURSE  As part of my medical decision making, I reviewed the following data within the electronic MEDICAL RECORD NUMBER Notes from prior ED visits and [place] Controlled Substance Database   This is a 47 year old male who comes in with some chest pain, palpitations lightheadedness and dizziness.  The patient did receive a bee sting and he is allergic.  He reports that he is unsure if this has anything to do with the amount of Benadryl  he took or if there is something else.  We did check some blood work on the patient to include a CBC, BMP and 2 troponins.  The patient's blood work is unremarkable.  The patient also had a chest x-ray which is negative.  I did give him a liter of normal saline as well as some Solu-Medrol.  The patient has no complaints at this time and his heart rate is improved.  I feel that the patient may have had some dehydration as he had not eaten or drink much today and had been sleeping.  There is a possibility that the Benadryl may have contributed to some of his symptoms and the tachycardia contributed to his chest pain. As his blood work is unremarkable the patient will be discharged home and encouraged to follow-up with his primary care physician.  He should return with any worsening conditions or any other concerns.      ____________________________________________   FINAL CLINICAL IMPRESSION(S) / ED DIAGNOSES  Final diagnoses:  Chest pain, unspecified type  Palpitations     ED Discharge Orders    None       Note:  This document was prepared using Dragon voice recognition software and may include unintentional  dictation errors.   Rebecka ApleyWebster, Broghan Pannone P, MD 05/20/18 [phone]

## 2018-05-20 NOTE — ED Notes (Signed)
ED Provider at bedside. 

## 2018-05-27 ENCOUNTER — Other Ambulatory Visit: Payer: Self-pay

## 2018-05-27 ENCOUNTER — Emergency Department
Admission: EM | Admit: 2018-05-27 | Discharge: 2018-05-28 | Disposition: A | Discharge status: Home / Self Care | Payer: Self-pay | Attending: Emergency Medicine | Admitting: Emergency Medicine

## 2018-05-27 ENCOUNTER — Emergency Department: Payer: Self-pay

## 2018-05-27 DIAGNOSIS — F1910 Other psychoactive substance abuse, uncomplicated: Secondary | ICD-10-CM | POA: Insufficient documentation

## 2018-05-27 DIAGNOSIS — F1410 Cocaine abuse, uncomplicated: Secondary | ICD-10-CM | POA: Insufficient documentation

## 2018-05-27 DIAGNOSIS — R0789 Other chest pain: Secondary | ICD-10-CM | POA: Insufficient documentation

## 2018-05-27 DIAGNOSIS — F17210 Nicotine dependence, cigarettes, uncomplicated: Secondary | ICD-10-CM | POA: Insufficient documentation

## 2018-05-27 NOTE — ED Triage Notes (Signed)
Patient to ED for chest pain that started 45 mins prior to arrival. States he became hot and sweaty and was also nauseated. Patient is histrionic in his explanation of the onset and is unable to complete a sentence without leaning forward or calling out to Jesus. Denies current cocaine or marijuana use but states he has used it in the past. EKG, blood work have been performed at this time. History of irregular heartbeat.

## 2018-05-27 NOTE — ED Notes (Signed)
Patient transported to X-ray 

## 2018-05-27 NOTE — ED Provider Notes (Signed)
[facility] Emergency Department Provider Note  ____________________________________________   First MD Initiated Contact with Patient 05/27/18 2355     (approximate)  I have reviewed the triage vital signs and the nursing notes.   HISTORY  Chief Complaint Chest Pain    HPI Bruce [name] is a 47 y.o. male who self presents to the emergency department with roughly 1 hour of substernal aching moderate to severe chest pain associated with mild shortness of breath.   He began to become hot and sweaty which concerned him so he came to the emergency department.  He denies drug or alcohol use.  He denies history of coronary artery disease.  He takes no medications.  His pain is now constant.  Nonradiating.  Nothing seems to make it better or worse.   Past Medical History:  Diagnosis Date  . Dental caries   . Hepatitis C infection   . Iritis   . PAF (paroxysmal atrial fibrillation) (HCC)    a. diagnosed 03/19/18; b. CHADS2VASc 0  . Polysubstance abuse (HCC)    a. cocaine, tobacco, etoh  . Scrotal mass   . Tick borne fever   . Uveitis     Patient Active Problem List   Diagnosis Date Noted  . Atrial fibrillation (HCC) 03/19/2018  . Abnormality of gait 02/12/2013  . Mandibular fracture (HCC) 12/25/2012  . Flu vaccine need 12/25/2012  . Scrotal mass 05/22/2012  . Dental caries 05/22/2012  . Hepatitis C infection 05/16/2012  . Myalgia 05/06/2012  . Arthralgia 05/06/2012  . Tick borne fever, likely 05/05/2012  . Tinea cruris, likely 05/05/2012  . Spermatocele of epididymis 05/01/2012  . Bilateral varicoceles 05/01/2012  . Bilateral hydrocele 05/01/2012    Past Surgical History:  Procedure Laterality Date  . FINGER SURGERY     infection, middle left  . fractured jaw    . SHOULDER SURGERY     right    Prior to Admission medications   Not on File    Allergies Penicillins  Family History  Problem Relation Age of Onset  . Drug abuse Father       Social History Social History   Tobacco Use  . Smoking status: Current Some Day Smoker    Types: Cigarettes  . Smokeless tobacco: Never Used  Substance Use Topics  . Alcohol use: Yes    Comment: 6 pack per week  . Drug use: Yes    Types: IV, Cocaine    Review of Systems Constitutional: No fever/chills Eyes: No visual changes. ENT: No sore throat. Cardiovascular: Positive for chest pain. Respiratory: Positive for shortness of breath. Gastrointestinal: No abdominal pain.  Positive for nausea, no vomiting.  No diarrhea.  No constipation. Genitourinary: Negative for dysuria. Musculoskeletal: Negative for back pain. Skin: Negative for rash. Neurological: Negative for headaches, focal weakness or numbness.   ____________________________________________   PHYSICAL EXAM:  VITAL SIGNS: ED Triage Vitals  Enc Vitals Group     BP 05/27/18 2337 (!) 137/101     Pulse Rate 05/27/18 2337 87     Resp 05/27/18 2337 16     Temp 05/27/18 2337 98.4 F (36.9 C)     Temp Source 05/27/18 2337 Oral     SpO2 05/27/18 2337 97 %     Weight 05/27/18 2346 187 lb (84.8 kg)     Height 05/27/18 2346 5\' 7"  (1.702 m)     Head Circumference --      Peak Flow --  Pain Score 05/27/18 2346 10     Pain Loc --      Pain Edu? --      Excl. in GC? --     Constitutional: Appears quite uncomfortable mildly diaphoretic perseverating and holding his chest Eyes: PERRL EOMI. dilated and brisk Head: Atraumatic. Nose: No congestion/rhinnorhea. Mouth/Throat: No trismus Neck: No stridor.   Cardiovascular: Tachycardic rate, regular rhythm. Grossly normal heart sounds.  Good peripheral circulation. Respiratory: Increased respiratory effort.  No retractions. Lungs CTAB and moving good air Gastrointestinal: Soft nontender Musculoskeletal: No lower extremity edema   Neurologic:  Normal speech and language. No gross focal neurologic deficits are appreciated. Skin: Diaphoretic Psychiatric: Anxious  appearing    ____________________________________________   DIFFERENTIAL includes but not limited to  Alcohol withdrawal, cocaine chest pain, acute coronary syndrome, pulmonary embolism, benzodiazepine withdrawal ____________________________________________   LABS (all labs ordered are listed, but only abnormal results are displayed)  Labs Reviewed  BASIC METABOLIC PANEL - Abnormal; Notable for the following components:      Result Value   Glucose, Bld 104 (*)    All other components within normal limits  CBC - Abnormal; Notable for the following components:   MCHC 36.1 (*)    All other components within normal limits  T4, FREE - Abnormal; Notable for the following components:   Free T4 0.75 (*)    All other components within normal limits  ETHANOL - Abnormal; Notable for the following components:   Alcohol, Ethyl (B) 16 (*)    All other components within normal limits  URINE DRUG SCREEN, QUALITATIVE ([facility] ONLY) - Abnormal; Notable for the following components:   Cocaine Metabolite,Ur [place] POSITIVE (*)    Barbiturates, Ur Screen   (*)    Value: Result not available. Reagent lot number recalled by manufacturer.   All other components within normal limits  HEPATIC FUNCTION PANEL - Abnormal; Notable for the following components:   Total Protein 8.6 (*)    AST 153 (*)    ALT 151 (*)    All other components within normal limits  LIPASE, BLOOD - Abnormal; Notable for the following components:   Lipase 60 (*)    All other components within normal limits  TROPONIN I  TSH  CK  TROPONIN I    Lab work viewed by me with no signs of acute ischemia x2.  He is positive for cocaine __________________________________________  EKG  ED ECG REPORT I, Merrily Brittle, the attending physician, personally viewed and interpreted this ECG.  Date: 05/27/2018 EKG Time:  Rate: 77 Rhythm: normal sinus rhythm QRS Axis: normal Intervals: normal ST/T Wave abnormalities: normal Narrative  Interpretation: no evidence of acute ischemia  ____________________________________________  RADIOLOGY  Chest x-ray reviewed by me with no acute disease ____________________________________________   PROCEDURES  Procedure(s) performed: no  Procedures  Critical Care performed: no  ____________________________________________   INITIAL IMPRESSION / ASSESSMENT AND PLAN / ED COURSE  Pertinent labs & imaging results that were available during my care of the patient were reviewed by me and considered in my medical decision making (see chart for details).   On arrival the patient is somewhat tremulous and anxious appearing with concerning chest pain.  Given 2 mg of lorazepam IV with significant improvement in symptoms.  EKG is nonischemic chest x-ray is reassuring and first troponin is negative.  As the pain began shortly prior to arrival I do believe she requires 2 troponins.  The patient's second troponin is negative and he feels significant relief.  His lab work came back positive for cocaine and he does confess to smoking cocaine recently.  I have advised cessation and I will help him establish care with primary care.  Strict return precautions have been given.      ____________________________________________   FINAL CLINICAL IMPRESSION(S) / ED DIAGNOSES  Final diagnoses:  Atypical chest pain  Cocaine abuse (HCC)      NEW MEDICATIONS STARTED DURING THIS VISIT:  There are no discharge medications for this patient.    Note:  This document was prepared using Dragon voice recognition software and may include unintentional dictation errors.     Merrily Brittle, MD 05/28/18 [phone]

## 2018-05-28 LAB — BASIC METABOLIC PANEL
ANION GAP: 10 (ref 5–15)
BUN: 12 mg/dL (ref 6–20)
CALCIUM: 9.8 mg/dL (ref 8.9–10.3)
CO2: 28 mmol/L (ref 22–32)
Chloride: 105 mmol/L (ref 98–111)
Creatinine, Ser: 0.92 mg/dL (ref 0.61–1.24)
GFR calc Af Amer: >60 mL/min (ref >60)
Glucose, Bld: 104 mg/dL — ABNORMAL HIGH (ref 70–99)
POTASSIUM: 3.9 mmol/L (ref 3.5–5.1)
SODIUM: 143 mmol/L (ref 135–145)

## 2018-05-28 LAB — URINE DRUG SCREEN, QUALITATIVE (ARMC ONLY)
Amphetamines, Ur Screen: NOT DETECTED
Benzodiazepine, Ur Scrn: NOT DETECTED
CANNABINOID 50 NG, UR ~~LOC~~: NOT DETECTED
Cocaine Metabolite,Ur ~~LOC~~: POSITIVE — AB
MDMA (Ecstasy)Ur Screen: NOT DETECTED
Methadone Scn, Ur: NOT DETECTED
Opiate, Ur Screen: NOT DETECTED
Phencyclidine (PCP) Ur S: NOT DETECTED
Tricyclic, Ur Screen: NOT DETECTED

## 2018-05-28 LAB — CBC
HEMATOCRIT: 49.4 % (ref 40.0–52.0)
HEMOGLOBIN: 17.8 g/dL (ref 13.0–18.0)
MCH: 32 pg (ref 26.0–34.0)
MCHC: 36.1 g/dL — ABNORMAL HIGH (ref 32.0–36.0)
MCV: 88.7 fL (ref 80.0–100.0)
Platelets: 234 10*3/uL (ref 150–440)
RBC: 5.57 MIL/uL (ref 4.40–5.90)
RDW: 13.7 % (ref 11.5–14.5)
WBC: 4.7 10*3/uL (ref 3.8–10.6)

## 2018-05-28 LAB — TROPONIN I: Troponin I: <0.03 ng/mL (ref <0.03)

## 2018-05-28 LAB — HEPATIC FUNCTION PANEL
ALK PHOS: 79 U/L (ref 38–126)
ALT: 151 U/L — ABNORMAL HIGH (ref 0–44)
AST: 153 U/L — ABNORMAL HIGH (ref 15–41)
Albumin: 4.2 g/dL (ref 3.5–5.0)
BILIRUBIN DIRECT: 0.2 mg/dL (ref 0.0–0.2)
BILIRUBIN INDIRECT: 0.8 mg/dL (ref 0.3–0.9)
Total Bilirubin: 1 mg/dL (ref 0.3–1.2)
Total Protein: 8.6 g/dL — ABNORMAL HIGH (ref 6.5–8.1)

## 2018-05-28 LAB — TSH: TSH: 0.841 u[IU]/mL (ref 0.350–4.500)

## 2018-05-28 LAB — CK: CK TOTAL: 221 U/L (ref 49–397)

## 2018-05-28 LAB — T4, FREE: FREE T4: 0.75 ng/dL — AB (ref 0.82–1.77)

## 2018-05-28 LAB — ETHANOL: ALCOHOL ETHYL (B): 16 mg/dL — AB (ref <10)

## 2018-05-28 LAB — LIPASE, BLOOD: LIPASE: 60 U/L — AB (ref 11–51)

## 2018-05-28 MED ORDER — SODIUM CHLORIDE 0.9 % IV BOLUS
2000.0000 mL | Freq: Once | INTRAVENOUS | Status: AC
  Administered 2018-05-28: 2000 mL via INTRAVENOUS

## 2018-05-28 MED ORDER — LORAZEPAM 2 MG/ML IJ SOLN
2.0000 mg | Freq: Once | INTRAMUSCULAR | Status: AC
  Administered 2018-05-28: 2 mg via INTRAVENOUS
  Filled 2018-05-28: qty 1

## 2018-05-28 NOTE — Discharge Instructions (Signed)
It was a pleasure to take care of you today, and thank you for coming to our emergency department.  If you have any questions or concerns before leaving please ask the nurse to grab me and I'm more than happy to go through your aftercare instructions again.  If you were prescribed any opioid pain medication today such as Norco, Vicodin, Percocet, morphine, hydrocodone, or oxycodone please make sure you do not drive when you are taking this medication as it can alter your ability to drive safely.  If you have any concerns once you are home that you are not improving or are in fact getting worse before you can make it to your follow-up appointment, please do not hesitate to call 911 and come back for further evaluation.  Merrily Brittle, MD  Results for orders placed or performed during the hospital encounter of 05/27/18  Basic metabolic panel  Result Value Ref Range   Sodium 143 135 - 145 mmol/L   Potassium 3.9 3.5 - 5.1 mmol/L   Chloride 105 98 - 111 mmol/L   CO2 28 22 - 32 mmol/L   Glucose, Bld 104 (H) 70 - 99 mg/dL   BUN 12 6 - 20 mg/dL   Creatinine, Ser 1.61 0.61 - 1.24 mg/dL   Calcium 9.8 8.9 - 09.6 mg/dL   GFR calc non Af Amer >60 >60 mL/min   GFR calc Af Amer >60 >60 mL/min   Anion gap 10 5 - 15  CBC  Result Value Ref Range   WBC 4.7 3.8 - 10.6 K/uL   RBC 5.57 4.40 - 5.90 MIL/uL   Hemoglobin 17.8 13.0 - 18.0 g/dL   HCT 04.5 40.9 - 81.1 %   MCV 88.7 80.0 - 100.0 fL   MCH 32.0 26.0 - 34.0 pg   MCHC 36.1 (H) 32.0 - 36.0 g/dL   RDW 91.4 78.2 - 95.6 %   Platelets 234 150 - 440 K/uL  Troponin I  Result Value Ref Range   Troponin I <0.03 <0.03 ng/mL  TSH  Result Value Ref Range   TSH 0.841 0.350 - 4.500 uIU/mL  T4, free  Result Value Ref Range   Free T4 0.75 (L) 0.82 - 1.77 ng/dL  Ethanol  Result Value Ref Range   Alcohol, Ethyl (B) 16 (H) <10 mg/dL  Urine Drug Screen, Qualitative  Result Value Ref Range   Tricyclic, Ur Screen NONE DETECTED NONE DETECTED   Amphetamines,  Ur Screen NONE DETECTED NONE DETECTED   MDMA (Ecstasy)Ur Screen NONE DETECTED NONE DETECTED   Cocaine Metabolite,Ur [place] POSITIVE (A) NONE DETECTED   Opiate, Ur Screen NONE DETECTED NONE DETECTED   Phencyclidine (PCP) Ur S NONE DETECTED NONE DETECTED   Cannabinoid 50 Ng, Ur [place] NONE DETECTED NONE DETECTED   Barbiturates, Ur Screen (A) NONE DETECTED    Result not available. Reagent lot number recalled by manufacturer.   Benzodiazepine, Ur Scrn NONE DETECTED NONE DETECTED   Methadone Scn, Ur NONE DETECTED NONE DETECTED  CK  Result Value Ref Range   Total CK 221 49 - 397 U/L  Hepatic function panel  Result Value Ref Range   Total Protein 8.6 (H) 6.5 - 8.1 g/dL   Albumin 4.2 3.5 - 5.0 g/dL   AST 213 (H) 15 - 41 U/L   ALT 151 (H) 0 - 44 U/L   Alkaline Phosphatase 79 38 - 126 U/L   Total Bilirubin 1.0 0.3 - 1.2 mg/dL   Bilirubin, Direct 0.2 0.0 - 0.2 mg/dL  Indirect Bilirubin 0.8 0.3 - 0.9 mg/dL  Lipase, blood  Result Value Ref Range   Lipase 60 (H) 11 - 51 U/L  Troponin I  Result Value Ref Range   Troponin I <0.03 <0.03 ng/mL   Dg Chest 2 View  Result Date: 05/27/2018 CLINICAL DATA:  Chest pain EXAM: CHEST - 2 VIEW COMPARISON:  05/19/2018 FINDINGS: The heart size and mediastinal contours are within normal limits. Both lungs are clear. The visualized skeletal structures are unremarkable. IMPRESSION: No active cardiopulmonary disease. Electronically Signed   By: Jasmine PangKim  Fujinaga M.D.   On: 05/27/2018 23:56   Dg Chest 2 View  Result Date: 05/19/2018 CLINICAL DATA:  Stung by a bee in the posterior neck last night. Swelling despite Benadryl. Left chest pain, palpitations and dizziness. EXAM: CHEST - 2 VIEW COMPARISON:  Radiographs 03/22/2018. FINDINGS: The heart size and mediastinal contours are normal. The lungs are clear. There is no pleural effusion or pneumothorax. No acute osseous findings are identified. The visualized soft tissues appear grossly stable. No evidence of airway compromise.  IMPRESSION: Stable chest.  No active cardiopulmonary process. Electronically Signed   By: Carey BullocksWilliam  Veazey M.D.   On: 05/19/2018 20:16

## 2018-06-10 ENCOUNTER — Encounter: Payer: Self-pay | Admitting: Emergency Medicine

## 2018-06-10 ENCOUNTER — Emergency Department
Admission: EM | Admit: 2018-06-10 | Discharge: 2018-06-10 | Disposition: A | Discharge status: Home / Self Care | Payer: Self-pay | Attending: Emergency Medicine | Admitting: Emergency Medicine

## 2018-06-10 ENCOUNTER — Other Ambulatory Visit: Payer: Self-pay

## 2018-06-10 ENCOUNTER — Emergency Department: Payer: Self-pay

## 2018-06-10 DIAGNOSIS — R0789 Other chest pain: Secondary | ICD-10-CM | POA: Insufficient documentation

## 2018-06-10 DIAGNOSIS — F17210 Nicotine dependence, cigarettes, uncomplicated: Secondary | ICD-10-CM | POA: Insufficient documentation

## 2018-06-10 DIAGNOSIS — R079 Chest pain, unspecified: Secondary | ICD-10-CM

## 2018-06-10 DIAGNOSIS — F1490 Cocaine use, unspecified, uncomplicated: Secondary | ICD-10-CM | POA: Insufficient documentation

## 2018-06-10 LAB — CBC
HCT: 49.3 % (ref 40.0–52.0)
Hemoglobin: 17.5 g/dL (ref 13.0–18.0)
MCH: 31.6 pg (ref 26.0–34.0)
MCHC: 35.5 g/dL (ref 32.0–36.0)
MCV: 89.1 fL (ref 80.0–100.0)
PLATELETS: 271 10*3/uL (ref 150–440)
RBC: 5.53 MIL/uL (ref 4.40–5.90)
RDW: 13.7 % (ref 11.5–14.5)
WBC: 5.9 10*3/uL (ref 3.8–10.6)

## 2018-06-10 LAB — BASIC METABOLIC PANEL
Anion gap: 11 (ref 5–15)
BUN: 6 mg/dL (ref 6–20)
CALCIUM: 9.4 mg/dL (ref 8.9–10.3)
CO2: 28 mmol/L (ref 22–32)
Chloride: 99 mmol/L (ref 98–111)
Creatinine, Ser: 0.81 mg/dL (ref 0.61–1.24)
GFR calc Af Amer: >60 mL/min (ref >60)
GFR calc non Af Amer: >60 mL/min (ref >60)
Glucose, Bld: 117 mg/dL — ABNORMAL HIGH (ref 70–99)
Potassium: 3.6 mmol/L (ref 3.5–5.1)
Sodium: 138 mmol/L (ref 135–145)

## 2018-06-10 LAB — URINE DRUG SCREEN, QUALITATIVE (ARMC ONLY)
AMPHETAMINES, UR SCREEN: NOT DETECTED
Barbiturates, Ur Screen: NOT DETECTED
Cannabinoid 50 Ng, Ur ~~LOC~~: NOT DETECTED
Cocaine Metabolite,Ur ~~LOC~~: POSITIVE — AB
MDMA (Ecstasy)Ur Screen: NOT DETECTED
Methadone Scn, Ur: NOT DETECTED
OPIATE, UR SCREEN: NOT DETECTED
PHENCYCLIDINE (PCP) UR S: NOT DETECTED
Tricyclic, Ur Screen: NOT DETECTED

## 2018-06-10 LAB — TROPONIN I: Troponin I: <0.03 ng/mL (ref <0.03)

## 2018-06-10 MED ORDER — NITROGLYCERIN 0.4 MG SL SUBL
0.4000 mg | SUBLINGUAL_TABLET | SUBLINGUAL | Status: DC | PRN
  Administered 2018-06-10: 0.4 mg via SUBLINGUAL
  Filled 2018-06-10: qty 1

## 2018-06-10 MED ORDER — NITROGLYCERIN 0.4 MG SL SUBL: 0.4000 mg | SUBLINGUAL_TABLET | SUBLINGUAL | 0 refills | Status: DC | PRN

## 2018-06-10 NOTE — Discharge Instructions (Signed)
Please seek medical attention for any high fevers, chest pain, shortness of breath, change in behavior, persistent vomiting, bloody stool or any other new or concerning symptoms.  

## 2018-06-10 NOTE — ED Provider Notes (Signed)
[facility] Emergency Department Provider Note   ____________________________________________   I have reviewed the triage vital signs and the nursing notes.   HISTORY  Chief Complaint Chest Pain   History limited by: Not Limited   HPI Bruce [name] is a 47 y.o. male who presents to the emergency department today because of chest pain.  Is located in his left chest.  It is pressure-like.  Patient states that it started earlier today.  Has been fairly constant throughout the day.  Has had been having this pain on and off for roughly 3 months.  Has been seen in the emergency department multiple times for chest pain.  Endorses cocaine use, last time a couple days ago.    Per medical record review patient has a history of multiple recent er visits for chest pain.  Past Medical History:  Diagnosis Date  . Dental caries   . Hepatitis C infection   . Iritis   . PAF (paroxysmal atrial fibrillation) (HCC)    a. diagnosed 03/19/18; b. CHADS2VASc 0  . Polysubstance abuse (HCC)    a. cocaine, tobacco, etoh  . Scrotal mass   . Tick borne fever   . Uveitis     Patient Active Problem List   Diagnosis Date Noted  . Atrial fibrillation (HCC) 03/19/2018  . Abnormality of gait 02/12/2013  . Mandibular fracture (HCC) 12/25/2012  . Flu vaccine need 12/25/2012  . Scrotal mass 05/22/2012  . Dental caries 05/22/2012  . Hepatitis C infection 05/16/2012  . Myalgia 05/06/2012  . Arthralgia 05/06/2012  . Tick borne fever, likely 05/05/2012  . Tinea cruris, likely 05/05/2012  . Spermatocele of epididymis 05/01/2012  . Bilateral varicoceles 05/01/2012  . Bilateral hydrocele 05/01/2012    Past Surgical History:  Procedure Laterality Date  . FINGER SURGERY     infection, middle left  . fractured jaw    . SHOULDER SURGERY     right    Prior to Admission medications   Not on File    Allergies Penicillins  Family History  Problem Relation Age of Onset  .  Drug abuse Father     Social History Social History   Tobacco Use  . Smoking status: Current Some Day Smoker    Types: Cigarettes  . Smokeless tobacco: Never Used  Substance Use Topics  . Alcohol use: Yes    Comment: 6 pack per week  . Drug use: Yes    Types: IV, Cocaine    Review of Systems Constitutional: No fever/chills Eyes: No visual changes. ENT: No sore throat. Cardiovascular: Positive for chest pain. Respiratory: Denies shortness of breath. Gastrointestinal: No abdominal pain.  No nausea, no vomiting.  No diarrhea.   Genitourinary: Negative for dysuria. Musculoskeletal: Negative for back pain. Skin: Negative for rash. Neurological: Negative for headaches, focal weakness or numbness.  ____________________________________________   PHYSICAL EXAM:  VITAL SIGNS: ED Triage Vitals  Enc Vitals Group     BP 06/10/18 1908 (!) 136/110     Pulse Rate 06/10/18 1908 75     Resp 06/10/18 1908 18     Temp 06/10/18 1908 98 F (36.7 C)     Temp Source 06/10/18 1908 Oral     SpO2 06/10/18 1908 99 %     Weight 06/10/18 1909 187 lb (84.8 kg)     Height 06/10/18 1909 5\' 7"  (1.702 m)     Head Circumference --      Peak Flow --      Pain  Score 06/10/18 1909 10   Constitutional: Alert and oriented.  Eyes: Conjunctivae are normal.  ENT      Head: Normocephalic and atraumatic.      Nose: No congestion/rhinnorhea.      Mouth/Throat: Mucous membranes are moist.      Neck: No stridor. Hematological/Lymphatic/Immunilogical: No cervical lymphadenopathy. Cardiovascular: Normal rate, regular rhythm.  No murmurs, rubs, or gallops.  Respiratory: Normal respiratory effort without tachypnea nor retractions. Breath sounds are clear and equal bilaterally. No wheezes/rales/rhonchi. Gastrointestinal: Soft and non tender. No rebound. No guarding.  Genitourinary: Deferred Musculoskeletal: Normal range of motion in all extremities. No lower extremity edema. Neurologic:  Normal speech and  language. No gross focal neurologic deficits are appreciated.  Skin:  Skin is warm, dry and intact. No rash noted. Psychiatric: Mood and affect are normal. Speech and behavior are normal. Patient exhibits appropriate insight and judgment.  ____________________________________________    LABS (pertinent positives/negatives)  Trop <0.03 CBC wnl BMP glu 117 otherwise wnl UDS pos cocaine  ____________________________________________   EKG  I, Phineas Semen, attending physician, personally viewed and interpreted this EKG  EKG Time: 1904 Rate: 72 Rhythm: normal sinus rhythm Axis: normal Intervals: qtc 433 QRS: narrow ST changes: no st elevation Impression: normal ekg   ____________________________________________    RADIOLOGY  CXR Normal   ____________________________________________   PROCEDURES  Procedures  ____________________________________________   INITIAL IMPRESSION / ASSESSMENT AND PLAN / ED COURSE  Pertinent labs & imaging results that were available during my care of the patient were reviewed by me and considered in my medical decision making (see chart for details).   Patient presented to the emergency department today because of concerns for chest pain.  He has had multiple ED visits for chest pain.  EKG and troponin here negative.  Patient does have a history of cocaine use.  Discussed with patient importance of cocaine cessation.  ____________________________________________   FINAL CLINICAL IMPRESSION(S) / ED DIAGNOSES  Final diagnoses:  Nonspecific chest pain     Note: This dictation was prepared with Dragon dictation. Any transcriptional errors that result from this process are unintentional     Phineas Semen, MD 06/10/18 [phone]

## 2018-06-10 NOTE — ED Triage Notes (Signed)
Pt arrives POV to triage with c/o chest pain since 1800. Pt states that he has [facility] but missed his stress test and was not given another appointment with the cardiologist. Pt is in NAD.

## 2018-06-10 NOTE — ED Notes (Signed)
Pt informed staff that he feels like his chest pain is increasing. First nurse informed at this time

## 2018-08-19 ENCOUNTER — Emergency Department: Payer: Self-pay

## 2018-08-19 ENCOUNTER — Other Ambulatory Visit: Payer: Self-pay

## 2018-08-19 DIAGNOSIS — I480 Paroxysmal atrial fibrillation: Secondary | ICD-10-CM | POA: Insufficient documentation

## 2018-08-19 DIAGNOSIS — R079 Chest pain, unspecified: Secondary | ICD-10-CM | POA: Insufficient documentation

## 2018-08-19 DIAGNOSIS — F17210 Nicotine dependence, cigarettes, uncomplicated: Secondary | ICD-10-CM | POA: Insufficient documentation

## 2018-08-19 LAB — CBC
HEMATOCRIT: 46.2 % (ref 39.0–52.0)
HEMOGLOBIN: 16.1 g/dL (ref 13.0–17.0)
MCH: 30.8 pg (ref 26.0–34.0)
MCHC: 34.8 g/dL (ref 30.0–36.0)
MCV: 88.3 fL (ref 80.0–100.0)
Platelets: 230 10*3/uL (ref 150–400)
RBC: 5.23 MIL/uL (ref 4.22–5.81)
RDW: 12.3 % (ref 11.5–15.5)
WBC: 5 10*3/uL (ref 4.0–10.5)
nRBC: 0 % (ref 0.0–0.2)

## 2018-08-19 LAB — BASIC METABOLIC PANEL
Anion gap: 6 (ref 5–15)
BUN: 9 mg/dL (ref 6–20)
CO2: 30 mmol/L (ref 22–32)
CREATININE: 0.92 mg/dL (ref 0.61–1.24)
Calcium: 9.2 mg/dL (ref 8.9–10.3)
Chloride: 103 mmol/L (ref 98–111)
GFR calc Af Amer: >60 mL/min (ref >60)
GFR calc non Af Amer: >60 mL/min (ref >60)
GLUCOSE: 100 mg/dL — AB (ref 70–99)
POTASSIUM: 3.9 mmol/L (ref 3.5–5.1)
Sodium: 139 mmol/L (ref 135–145)

## 2018-08-19 LAB — TROPONIN I: Troponin I: <0.03 ng/mL (ref <0.03)

## 2018-08-19 NOTE — ED Triage Notes (Signed)
Patient reports left sided chest pain that radiates down left arm off/on for approximately 4 hours.

## 2018-08-20 ENCOUNTER — Emergency Department
Admission: EM | Admit: 2018-08-20 | Discharge: 2018-08-20 | Disposition: A | Discharge status: Home / Self Care | Payer: Self-pay | Attending: Emergency Medicine | Admitting: Emergency Medicine

## 2018-08-20 DIAGNOSIS — R079 Chest pain, unspecified: Secondary | ICD-10-CM

## 2018-08-20 LAB — URINE DRUG SCREEN, QUALITATIVE (ARMC ONLY)
AMPHETAMINES, UR SCREEN: NOT DETECTED
Barbiturates, Ur Screen: NOT DETECTED
Benzodiazepine, Ur Scrn: NOT DETECTED
Cannabinoid 50 Ng, Ur ~~LOC~~: NOT DETECTED
Cocaine Metabolite,Ur ~~LOC~~: POSITIVE — AB
MDMA (ECSTASY) UR SCREEN: NOT DETECTED
Methadone Scn, Ur: NOT DETECTED
Opiate, Ur Screen: NOT DETECTED
PHENCYCLIDINE (PCP) UR S: NOT DETECTED
Tricyclic, Ur Screen: NOT DETECTED

## 2018-08-20 LAB — TROPONIN I: Troponin I: <0.03 ng/mL (ref <0.03)

## 2018-08-20 MED ORDER — ACETAMINOPHEN 325 MG PO TABS
650.0000 mg | ORAL_TABLET | Freq: Once | ORAL | Status: AC
  Administered 2018-08-20: 650 mg via ORAL
  Filled 2018-08-20: qty 2

## 2018-08-20 MED ORDER — ASPIRIN 81 MG PO CHEW
324.0000 mg | CHEWABLE_TABLET | Freq: Once | ORAL | Status: AC
  Administered 2018-08-20: 324 mg via ORAL
  Filled 2018-08-20: qty 4

## 2018-08-20 NOTE — Discharge Instructions (Signed)
You have been seen in the Emergency Department (ED) today for chest pain.  As we have discussed todays test results are normal, but you may require further testing.  As we discussed, it is very important that you avoid cocaine because this can cause or worsen your chest pain.  Please follow up with the recommended doctor as instructed above in these documents regarding todays emergent visit and your recent symptoms to discuss further management.  Continue to take your regular medications. If you are not doing so already, please also take a daily baby aspirin (81 mg), at least until you follow up with your doctor.  Return to the Emergency Department (ED) if you experience any further chest pain/pressure/tightness, difficulty breathing, or sudden sweating, or other symptoms that concern you.

## 2018-08-20 NOTE — ED Provider Notes (Signed)
[facility] Emergency Department Provider Note  ____________________________________________   First MD Initiated Contact with Patient 08/20/18 0110     (approximate)  I have reviewed the triage vital signs and the nursing notes.   HISTORY  Chief Complaint Chest Pain    HPI Bruce [name] is a 47 y.o. male with a history of polysubstance abuse and multiple prior visits for chest pain.  He presents tonight by private vehicle for evaluation of acute onset left-sided chest pain that occasionally radiates down the left arm.  He says it started acutely tonight while he was at rest watching TV.  He admits to recent cocaine use but says it is not been for a couple of days.  He smokes tobacco and has a history of paroxysmal A. fib but is not on anticoagulation.  He says that he has been told he should go to a cardiologist but has not yet followed up.  He denies diabetes and hyperlipidemia.  The chest pain feels similar to his prior episodes and he states it is severe is worse and mild that is best and seems to wax and wane.  He is not having any difficulty breathing.  He denies fever/chills, nausea, vomiting, and abdominal pain.  Past Medical History:  Diagnosis Date  . Dental caries   . Hepatitis C infection   . Iritis   . PAF (paroxysmal atrial fibrillation) (HCC)    a. diagnosed 03/19/18; b. CHADS2VASc 0  . Polysubstance abuse (HCC)    a. cocaine, tobacco, etoh  . Scrotal mass   . Tick borne fever   . Uveitis     Patient Active Problem List   Diagnosis Date Noted  . Atrial fibrillation (HCC) 03/19/2018  . Abnormality of gait 02/12/2013  . Mandibular fracture (HCC) 12/25/2012  . Flu vaccine need 12/25/2012  . Scrotal mass 05/22/2012  . Dental caries 05/22/2012  . Hepatitis C infection 05/16/2012  . Myalgia 05/06/2012  . Arthralgia 05/06/2012  . Tick borne fever, likely 05/05/2012  . Tinea cruris, likely 05/05/2012  . Spermatocele of epididymis  05/01/2012  . Bilateral varicoceles 05/01/2012  . Bilateral hydrocele 05/01/2012    Past Surgical History:  Procedure Laterality Date  . FINGER SURGERY     infection, middle left  . fractured jaw    . SHOULDER SURGERY     right    Prior to Admission medications   Medication Sig Start Date End Date Taking? Authorizing Provider  nitroGLYCERIN (NITROSTAT) 0.4 MG SL tablet Place 1 tablet (0.4 mg total) under the tongue every 5 (five) minutes as needed for chest pain. 06/10/18 06/10/19  Phineas Semen, MD    Allergies Penicillins  Family History  Problem Relation Age of Onset  . Drug abuse Father     Social History Social History   Tobacco Use  . Smoking status: Current Some Day Smoker    Types: Cigarettes  . Smokeless tobacco: Never Used  Substance Use Topics  . Alcohol use: Yes    Comment: 6 pack per week  . Drug use: Yes    Types: IV, Cocaine    Review of Systems Constitutional: No fever/chills Eyes: No visual changes. ENT: No sore throat. Cardiovascular: chest pain as described above Respiratory: Denies shortness of breath. Gastrointestinal: No abdominal pain.  No nausea, no vomiting.  No diarrhea.  No constipation. Genitourinary: Negative for dysuria. Musculoskeletal: Negative for neck pain.  Negative for back pain. Integumentary: Negative for rash. Neurological: Negative for headaches, focal weakness or numbness.  ____________________________________________   PHYSICAL EXAM:  VITAL SIGNS: ED Triage Vitals  Enc Vitals Group     BP 08/19/18 2309 135/80     Pulse Rate 08/19/18 2309 68     Resp 08/19/18 2309 18     Temp 08/19/18 2309 98.4 F (36.9 C)     Temp Source 08/19/18 2309 Oral     SpO2 08/19/18 2309 100 %     Weight 08/19/18 2307 81.6 kg (180 lb)     Height 08/19/18 2307 1.702 m (5\' 7" )     Head Circumference --      Peak Flow --      Pain Score 08/19/18 2307 8     Pain Loc --      Pain Edu? --      Excl. in GC? --     Constitutional:  Alert and oriented. Well appearing and in no acute distress, though occasionally he will wince and rub the left side of his chest Eyes: Conjunctivae are normal.  Head: Atraumatic. Nose: No congestion/rhinnorhea. Mouth/Throat: Mucous membranes are moist. Neck: No stridor.  No meningeal signs.   Cardiovascular: Normal rate, regular rhythm. Good peripheral circulation. Grossly normal heart sounds. Respiratory: Normal respiratory effort.  No retractions. Lungs CTAB. Gastrointestinal: Soft and nontender. No distention.  Musculoskeletal: No lower extremity tenderness nor edema. No gross deformities of extremities. Neurologic:  Normal speech and language. No gross focal neurologic deficits are appreciated.  Skin:  Skin is warm, dry and intact. No rash noted. Psychiatric: Mood and affect are normal. Speech and behavior are normal.  ____________________________________________   LABS (all labs ordered are listed, but only abnormal results are displayed)  Labs Reviewed  BASIC METABOLIC PANEL - Abnormal; Notable for the following components:      Result Value   Glucose, Bld 100 (*)    All other components within normal limits  URINE DRUG SCREEN, QUALITATIVE ([facility] ONLY) - Abnormal; Notable for the following components:   Cocaine Metabolite,Ur [place] POSITIVE (*)    All other components within normal limits  CBC  TROPONIN I  TROPONIN I   ____________________________________________  EKG  ED ECG REPORT I, Loleta Rose, the attending physician, personally viewed and interpreted this ECG.  Date: 08/19/2018 EKG Time: 23: 09 Rate: 58 Rhythm: Borderline sinus bradycardia QRS Axis: normal Intervals: normal ST/T Wave abnormalities: Non-specific ST segment / T-wave changes, but no evidence of acute ischemia. Narrative Interpretation: no evidence of acute ischemia   ____________________________________________  RADIOLOGY I, Loleta Rose, personally viewed and evaluated these images (plain  radiographs) as part of my medical decision making, as well as reviewing the written report by the radiologist.  ED MD interpretation: No acute abnormalities on chest x-ray  Official radiology report(s): Dg Chest 2 View  Result Date: 08/19/2018 CLINICAL DATA:  Acute onset left-sided chest pain radiating to left arm. EXAM: CHEST - 2 VIEW COMPARISON:  06/10/2018 FINDINGS: The heart size and mediastinal contours are within normal limits. Both lungs are clear. The visualized skeletal structures are unremarkable. IMPRESSION: No active cardiopulmonary disease. Electronically Signed   By: Myles Rosenthal M.D.   On: 08/19/2018 23:41    ____________________________________________   PROCEDURES  Critical Care performed: No   Procedure(s) performed:   Procedures   ____________________________________________   INITIAL IMPRESSION / ASSESSMENT AND PLAN / ED COURSE  As part of my medical decision making, I reviewed the following data within the electronic MEDICAL RECORD NUMBER Nursing notes reviewed and incorporated, Labs reviewed , EKG interpreted , Old  chart reviewed, Radiograph reviewed  and Notes from prior ED visits    Differential diagnosis includes, but is not limited to, ACS, cocaine induced chest pain, musculoskeletal chest pain, pneumothorax, pneumonia.  EKG is reassuring with no signs of ischemia, chest x-ray is normal.  Vital signs are stable.  Basic metabolic panel, troponin, and CBC are all within normal limits.  The patient admits to recent cocaine use and I once again counseled him against using cocaine and explained that it can lead to coronary artery spasms, chest pain, and even heart attack.  I am giving him a full dose aspirin and 650 mg of Tylenol and we will continue to monitor the patient and obtain a repeat troponin at the 3 to 4-hour timeline.  Assuming the troponin is negative he will be discharged for outpatient follow-up.  He has had 6 visits for chest pain in the last 6 months  and was admitted the first time.  He knows he needs to follow-up with a cardiologist.  He is in no acute distress at this time although he is having intermittent sharp pains.  Clinical Course as of Aug 20 336  Mon Aug 20, 2018  0219 Cocaine Metabolite,Ur [place](!): POSITIVE [CF]  0335 Patient's second troponin is negative and he has been resting comfortably.  I will discharge him as described above for outpatient follow-up.   [CF]    Clinical Course User Index [CF] Loleta Rose, MD    ____________________________________________  FINAL CLINICAL IMPRESSION(S) / ED DIAGNOSES  Final diagnoses:  Chest pain, unspecified type     MEDICATIONS GIVEN DURING THIS VISIT:  Medications  aspirin chewable tablet 324 mg (324 mg Oral Given 08/20/18 0216)  acetaminophen (TYLENOL) tablet 650 mg (650 mg Oral Given 08/20/18 0216)     ED Discharge Orders    None       Note:  This document was prepared using Dragon voice recognition software and may include unintentional dictation errors.    Loleta Rose, MD 08/20/18 [phone]

## 2018-08-20 NOTE — ED Notes (Signed)
No peripheral IV placed this visit.   Discharge instructions reviewed with patient. Questions fielded by this RN. Patient verbalizes understanding of instructions. Patient discharged home in stable condition per forbach. No acute distress noted at time of discharge.   

## 2018-09-13 ENCOUNTER — Other Ambulatory Visit: Payer: Self-pay

## 2018-09-13 ENCOUNTER — Encounter: Payer: Self-pay | Admitting: Emergency Medicine

## 2018-09-13 ENCOUNTER — Inpatient Hospital Stay
Admission: EM | Admit: 2018-09-13 | Discharge: 2018-09-14 | DRG: 309 | Disposition: A | Discharge status: Home / Self Care | Payer: Self-pay | Attending: Specialist | Admitting: Specialist

## 2018-09-13 ENCOUNTER — Emergency Department: Payer: Self-pay

## 2018-09-13 DIAGNOSIS — F1410 Cocaine abuse, uncomplicated: Secondary | ICD-10-CM | POA: Diagnosis present

## 2018-09-13 DIAGNOSIS — I480 Paroxysmal atrial fibrillation: Principal | ICD-10-CM | POA: Diagnosis present

## 2018-09-13 DIAGNOSIS — I248 Other forms of acute ischemic heart disease: Secondary | ICD-10-CM | POA: Diagnosis present

## 2018-09-13 DIAGNOSIS — Z880 Allergy status to penicillin: Secondary | ICD-10-CM

## 2018-09-13 DIAGNOSIS — Z813 Family history of other psychoactive substance abuse and dependence: Secondary | ICD-10-CM

## 2018-09-13 DIAGNOSIS — R079 Chest pain, unspecified: Secondary | ICD-10-CM

## 2018-09-13 DIAGNOSIS — Z79899 Other long term (current) drug therapy: Secondary | ICD-10-CM

## 2018-09-13 DIAGNOSIS — B1920 Unspecified viral hepatitis C without hepatic coma: Secondary | ICD-10-CM | POA: Diagnosis present

## 2018-09-13 DIAGNOSIS — Z23 Encounter for immunization: Secondary | ICD-10-CM

## 2018-09-13 DIAGNOSIS — F17210 Nicotine dependence, cigarettes, uncomplicated: Secondary | ICD-10-CM | POA: Diagnosis present

## 2018-09-13 DIAGNOSIS — I4891 Unspecified atrial fibrillation: Secondary | ICD-10-CM

## 2018-09-13 LAB — BASIC METABOLIC PANEL
Anion gap: 12 (ref 5–15)
BUN: 5 mg/dL — ABNORMAL LOW (ref 6–20)
CALCIUM: 9.4 mg/dL (ref 8.9–10.3)
CO2: 28 mmol/L (ref 22–32)
CREATININE: 0.9 mg/dL (ref 0.61–1.24)
Chloride: 103 mmol/L (ref 98–111)
GFR calc non Af Amer: >60 mL/min (ref >60)
Glucose, Bld: 112 mg/dL — ABNORMAL HIGH (ref 70–99)
Potassium: 3.7 mmol/L (ref 3.5–5.1)
Sodium: 143 mmol/L (ref 135–145)

## 2018-09-13 LAB — URINE DRUG SCREEN, QUALITATIVE (ARMC ONLY)
Amphetamines, Ur Screen: NOT DETECTED
BARBITURATES, UR SCREEN: NOT DETECTED
Benzodiazepine, Ur Scrn: NOT DETECTED
Cannabinoid 50 Ng, Ur ~~LOC~~: NOT DETECTED
Cocaine Metabolite,Ur ~~LOC~~: NOT DETECTED
MDMA (Ecstasy)Ur Screen: NOT DETECTED
METHADONE SCREEN, URINE: NOT DETECTED
OPIATE, UR SCREEN: POSITIVE — AB
Phencyclidine (PCP) Ur S: NOT DETECTED
TRICYCLIC, UR SCREEN: NOT DETECTED

## 2018-09-13 LAB — CBC
HCT: 53.1 % — ABNORMAL HIGH (ref 39.0–52.0)
Hemoglobin: 18.4 g/dL — ABNORMAL HIGH (ref 13.0–17.0)
MCH: 30.1 pg (ref 26.0–34.0)
MCHC: 34.7 g/dL (ref 30.0–36.0)
MCV: 86.9 fL (ref 80.0–100.0)
NRBC: 0 % (ref 0.0–0.2)
PLATELETS: 253 10*3/uL (ref 150–400)
RBC: 6.11 MIL/uL — AB (ref 4.22–5.81)
RDW: 12.8 % (ref 11.5–15.5)
WBC: 4.9 10*3/uL (ref 4.0–10.5)

## 2018-09-13 LAB — TROPONIN I
TROPONIN I: 0.03 ng/mL — AB (ref <0.03)
TROPONIN I: 0.03 ng/mL — AB (ref <0.03)

## 2018-09-13 LAB — TSH: TSH: 1.002 u[IU]/mL (ref 0.350–4.500)

## 2018-09-13 MED ORDER — ASPIRIN EC 81 MG PO TBEC
81.0000 mg | DELAYED_RELEASE_TABLET | Freq: Every day | ORAL | Status: DC
  Administered 2018-09-13 – 2018-09-14 (×2): 81 mg via ORAL
  Filled 2018-09-13 (×2): qty 1

## 2018-09-13 MED ORDER — ONDANSETRON HCL 4 MG PO TABS
4.0000 mg | ORAL_TABLET | Freq: Four times a day (QID) | ORAL | Status: DC | PRN
  Administered 2018-09-14: 4 mg via ORAL
  Filled 2018-09-13: qty 1

## 2018-09-13 MED ORDER — ACETAMINOPHEN 325 MG PO TABS: 650.0000 mg | ORAL_TABLET | Freq: Four times a day (QID) | ORAL | Status: DC | PRN

## 2018-09-13 MED ORDER — LORAZEPAM 2 MG/ML IJ SOLN
1.0000 mg | Freq: Once | INTRAMUSCULAR | Status: AC
  Administered 2018-09-13: 1 mg via INTRAVENOUS
  Filled 2018-09-13: qty 1

## 2018-09-13 MED ORDER — DILTIAZEM HCL 60 MG PO TABS
ORAL_TABLET | ORAL | Status: AC
  Administered 2018-09-13: 60 mg via ORAL
  Filled 2018-09-13: qty 1

## 2018-09-13 MED ORDER — DILTIAZEM HCL 60 MG PO TABS
60.0000 mg | ORAL_TABLET | Freq: Three times a day (TID) | ORAL | Status: DC
  Administered 2018-09-13 – 2018-09-14 (×2): 60 mg via ORAL
  Filled 2018-09-13 (×2): qty 1
  Filled 2018-09-13: qty 2
  Filled 2018-09-13: qty 1

## 2018-09-13 MED ORDER — DILTIAZEM HCL-DEXTROSE 100-5 MG/100ML-% IV SOLN (PREMIX)
5.0000 mg/h | Freq: Once | INTRAVENOUS | Status: AC
  Administered 2018-09-13: 5 mg/h via INTRAVENOUS
  Filled 2018-09-13: qty 100

## 2018-09-13 MED ORDER — SODIUM CHLORIDE 0.9 % IV SOLN
Freq: Once | INTRAVENOUS | Status: AC
  Administered 2018-09-13: 19:00:00 via INTRAVENOUS

## 2018-09-13 MED ORDER — ONDANSETRON HCL 4 MG/2ML IJ SOLN
4.0000 mg | Freq: Once | INTRAMUSCULAR | Status: AC
  Administered 2018-09-13: 4 mg via INTRAVENOUS
  Filled 2018-09-13: qty 2

## 2018-09-13 MED ORDER — DILTIAZEM HCL 25 MG/5ML IV SOLN
5.0000 mg | Freq: Once | INTRAVENOUS | Status: AC
  Administered 2018-09-13: 5 mg via INTRAVENOUS

## 2018-09-13 MED ORDER — HYDROCODONE-ACETAMINOPHEN 5-325 MG PO TABS
1.0000 | ORAL_TABLET | ORAL | Status: DC | PRN
  Administered 2018-09-13 – 2018-09-14 (×3): 1 via ORAL
  Filled 2018-09-13 (×3): qty 1

## 2018-09-13 MED ORDER — SODIUM CHLORIDE 0.9 % IV SOLN
INTRAVENOUS | Status: DC
  Administered 2018-09-13 – 2018-09-14 (×2): via INTRAVENOUS

## 2018-09-13 MED ORDER — FENTANYL CITRATE (PF) 100 MCG/2ML IJ SOLN
INTRAMUSCULAR | Status: AC
  Filled 2018-09-13: qty 2

## 2018-09-13 MED ORDER — ONDANSETRON HCL 4 MG/2ML IJ SOLN: 4.0000 mg | Freq: Four times a day (QID) | INTRAMUSCULAR | Status: DC | PRN

## 2018-09-13 MED ORDER — INFLUENZA VAC SPLIT QUAD 0.5 ML IM SUSY
0.5000 mL | PREFILLED_SYRINGE | INTRAMUSCULAR | Status: AC
  Administered 2018-09-14: 0.5 mL via INTRAMUSCULAR
  Filled 2018-09-13: qty 0.5

## 2018-09-13 MED ORDER — ACETAMINOPHEN 650 MG RE SUPP: 650.0000 mg | Freq: Four times a day (QID) | RECTAL | Status: DC | PRN

## 2018-09-13 MED ORDER — DILTIAZEM HCL 25 MG/5ML IV SOLN
10.0000 mg | Freq: Once | INTRAVENOUS | Status: AC
  Administered 2018-09-13: 10 mg via INTRAVENOUS

## 2018-09-13 MED ORDER — DILTIAZEM HCL 25 MG/5ML IV SOLN
INTRAVENOUS | Status: AC
  Administered 2018-09-13: 5 mg via INTRAVENOUS
  Filled 2018-09-13: qty 5

## 2018-09-13 MED ORDER — ENOXAPARIN SODIUM 40 MG/0.4ML ~~LOC~~ SOLN
40.0000 mg | SUBCUTANEOUS | Status: DC
  Administered 2018-09-13: 40 mg via SUBCUTANEOUS
  Filled 2018-09-13: qty 0.4

## 2018-09-13 MED ORDER — MIDAZOLAM HCL 5 MG/5ML IJ SOLN
INTRAMUSCULAR | Status: AC
  Filled 2018-09-13: qty 5

## 2018-09-13 MED ORDER — DIGOXIN 0.25 MG/ML IJ SOLN
0.2500 mg | Freq: Once | INTRAMUSCULAR | Status: AC
  Administered 2018-09-13: 0.25 mg via INTRAVENOUS
  Filled 2018-09-13 (×2): qty 2

## 2018-09-13 NOTE — ED Notes (Signed)
EDP informed of patient fluctuating BP. Verbal orders received. Will continue to monitor. Family at bedside. Pt appears less anxious than upon arrival. RR WNL and unlabored.

## 2018-09-13 NOTE — ED Notes (Signed)
Pt appears anxious, diaphoretic, pale. Unable to sit back in bed comfortably. Pt sitting straight up in bed at this time. Pt provided verbal reassurance and slowed breathing techniques due to hyperventilation.

## 2018-09-13 NOTE — ED Notes (Signed)
Pt dry heaving  

## 2018-09-13 NOTE — H&P (Addendum)
[facility] -  at [facility]   PATIENT NAME: Bruce [name]    MR#:  [id number]  DATE OF BIRTH:  1971-01-12  DATE OF ADMISSION:  09/13/2018  PRIMARY CARE PHYSICIAN: Patient, No Pcp Per   REQUESTING/REFERRING PHYSICIAN: Dionne Bucy, MD  CHIEF COMPLAINT:   Chief Complaint  Patient presents with  . Chest Pain    HISTORY OF PRESENT ILLNESS: Bruce [name]  is a 47 y.o. male with a known history of recurrent presentation to the ED for chest pain as well as history of paroxysmal atrial fibrillation with history of alcohol abuse and substance abuse in the past states that he does not drink on daily basis.  He also states that he stop using cocaine.  He states that he was having sex earlier after that he started having sharp substernal chest pain and started feeling worse.  Agent was brought to the emergency room and was noted to have A. fib with RVR.  Had to be started on a Cardizem drip.  Currently heart rate is improved  PAST MEDICAL HISTORY:   Past Medical History:  Diagnosis Date  . Dental caries   . Hepatitis C infection   . Iritis   . PAF (paroxysmal atrial fibrillation) (HCC)    a. diagnosed 03/19/18; b. CHADS2VASc 0  . Polysubstance abuse (HCC)    a. cocaine, tobacco, etoh  . Scrotal mass   . Tick borne fever   . Uveitis     PAST SURGICAL HISTORY:  Past Surgical History:  Procedure Laterality Date  . FINGER SURGERY     infection, middle left  . fractured jaw    . SHOULDER SURGERY     right    SOCIAL HISTORY:  Social History   Tobacco Use  . Smoking status: Current Some Day Smoker    Types: Cigarettes  . Smokeless tobacco: Never Used  Substance Use Topics  . Alcohol use: Yes    Comment: 6 pack per week    FAMILY HISTORY:  Family History  Problem Relation Age of Onset  . Drug abuse Father     DRUG ALLERGIES:  Allergies  Allergen Reactions  . Penicillins Other (See Comments)    Childhood reaction    REVIEW OF SYSTEMS:    CONSTITUTIONAL: No fever, fatigue or weakness.  EYES: No blurred or double vision.  EARS, NOSE, AND THROAT: No tinnitus or ear pain.  RESPIRATORY: No cough, shortness of breath, wheezing or hemoptysis.  CARDIOVASCULAR: Positive chest pain, orthopnea, edema.  GASTROINTESTINAL: No nausea, vomiting, diarrhea or abdominal pain.  GENITOURINARY: No dysuria, hematuria.  ENDOCRINE: No polyuria, nocturia,  HEMATOLOGY: No anemia, easy bruising or bleeding SKIN: No rash or lesion. MUSCULOSKELETAL: No joint pain or arthritis.   NEUROLOGIC: No tingling, numbness, weakness.  PSYCHIATRY: No anxiety or depression.   MEDICATIONS AT HOME:  Prior to Admission medications   Medication Sig Start Date End Date Taking? Authorizing Provider  nitroGLYCERIN (NITROSTAT) 0.4 MG SL tablet Place 1 tablet (0.4 mg total) under the tongue every 5 (five) minutes as needed for chest pain. 06/10/18 06/10/19  Phineas Semen, MD      PHYSICAL EXAMINATION:   VITAL SIGNS: Blood pressure 138/84, pulse 99, temperature 97.6 F (36.4 C), temperature source Oral, resp. rate 15, height 5\' 7"  (1.702 m), weight 81.6 kg, SpO2 99 %.  GENERAL:  47 y.o.-year-old patient lying in the bed with no acute distress.  EYES: Pupils equal, round, reactive to light and accommodation. No scleral icterus. Extraocular muscles intact.  HEENT: Head atraumatic, normocephalic. Oropharynx and nasopharynx clear.  NECK:  Supple, no jugular venous distention. No thyroid enlargement, no tenderness.  LUNGS: Normal breath sounds bilaterally, no wheezing, rales,rhonchi or crepitation. No use of accessory muscles of respiration.  CARDIOVASCULAR: Irregularly irregular. No murmurs, rubs, or gallops.  ABDOMEN: Soft, nontender, nondistended. Bowel sounds present. No organomegaly or mass.  EXTREMITIES: No pedal edema, cyanosis, or clubbing.  NEUROLOGIC: Cranial nerves II through XII are intact. Muscle strength 5/5 in all extremities. Sensation intact. Gait not  checked.  PSYCHIATRIC: The patient is alert and oriented x 3.  SKIN: No obvious rash, lesion, or ulcer.   LABORATORY PANEL:   CBC Recent Labs  Lab 09/13/18 1712  WBC 4.9  HGB 18.4*  HCT 53.1*  PLT 253  MCV 86.9  MCH 30.1  MCHC 34.7  RDW 12.8   ------------------------------------------------------------------------------------------------------------------  Chemistries  Recent Labs  Lab 09/13/18 1712  NA 143  K 3.7  CL 103  CO2 28  GLUCOSE 112*  BUN 5*  CREATININE 0.90  CALCIUM 9.4   ------------------------------------------------------------------------------------------------------------------ estimated creatinine clearance is 101.5 mL/min (by C-G formula based on SCr of 0.9 mg/dL). ------------------------------------------------------------------------------------------------------------------ No results for input(s): TSH, T4TOTAL, T3FREE, THYROIDAB in the last 72 hours.  Invalid input(s): FREET3   Coagulation profile No results for input(s): INR, PROTIME in the last 168 hours. ------------------------------------------------------------------------------------------------------------------- No results for input(s): DDIMER in the last 72 hours. -------------------------------------------------------------------------------------------------------------------  Cardiac Enzymes Recent Labs  Lab 09/13/18 1712  TROPONINI 0.03*   ------------------------------------------------------------------------------------------------------------------ Invalid input(s): POCBNP  ---------------------------------------------------------------------------------------------------------------  Urinalysis    Component Value Date/Time   COLORURINE YELLOW 05/01/2012 0835   APPEARANCEUR CLEAR 05/01/2012 0835   LABSPEC 1.015 05/01/2012 0835   PHURINE 8.0 05/01/2012 0835   GLUCOSEU NEGATIVE 05/01/2012 0835   HGBUR NEGATIVE 05/01/2012 0835   BILIRUBINUR NEGATIVE  05/01/2012 0835   KETONESUR NEGATIVE 05/01/2012 0835   PROTEINUR NEGATIVE 05/01/2012 0835   UROBILINOGEN 1.0 05/01/2012 0835   NITRITE NEGATIVE 05/01/2012 0835   LEUKOCYTESUR NEGATIVE 05/01/2012 0835     RADIOLOGY: Dg Chest Port 1 View  Result Date: 09/13/2018 CLINICAL DATA:  Acute onset nausea and vomiting 1 hour ago. Cough. History of polysubstance abuse. EXAM: PORTABLE CHEST 1 VIEW COMPARISON:  Chest radiograph August 19, 2018. FINDINGS: Cardiac silhouette is upper limits of normal in size. Mediastinal silhouette is unremarkable. No pleural effusions or focal consolidations. Trachea projects midline and there is no pneumothorax. Soft tissue planes and included osseous structures are non-suspicious. IMPRESSION: Borderline cardiomegaly.  No acute pulmonary process. Electronically Signed   By: Awilda Metro M.D.   On: 09/13/2018 17:46    EKG: Orders placed or performed during the hospital encounter of 09/13/18  . ED EKG within 10 minutes  . ED EKG within 10 minutes  . EKG 12-Lead  . EKG 12-Lead  . ED EKG  . ED EKG  . EKG 12-Lead  . EKG 12-Lead    IMPRESSION AND PLAN: Patient is 47 year old presenting with chest pain  1.  A. fib with RVR currently on IV Cardizem drip which I will try to convert to oral Cardizem give him a dose of IV dig and use IV Cardizem as needed Place him on aspirin once a day Patient had a echo in May which showed diastolic dysfunction Treat him with aspirin Patient should be discharged on Cardizem or other rate lowering medication if possible  2.  Substance abuse urine drug screen currently pending  3. misc : lovenox for dvt proph  4.  Nicotine  abuse smoking cessation provided 4 minutes spent strongly recommend patient stop smoking offered him a nicotine patch he states that he does not want a nicotine patch  All the records are reviewed and case discussed with ED provider. Management plans discussed with the patient, family and they are in  agreement.  CODE STATUS: Code Status History    Date Active Date Inactive Code Status Order ID Comments User Context   03/19/2018 2027 03/20/2018 1444 Full Code [id number]  Adrian Saran, MD Inpatient       TOTAL TIME TAKING CARE OF THIS PATIENT: 55 minutes.    Auburn Bilberry M.D on 09/13/2018 at 7:26 PM  Between 7am to 6pm - Pager - [phone]  After 6pm go to [email or web address] - password [email or web address] [facility]  [facility] Office  [phone]  CC: Primary care physician; Patient, No Pcp Per

## 2018-09-13 NOTE — ED Notes (Signed)
Date and time results received: 09/13/18 6:43 PM  (use smartphrase ".now" to insert current time)  Test: troponin Critical Value: 0.03  Name of Provider Notified: Dr. Dionne Bucy.

## 2018-09-13 NOTE — ED Notes (Addendum)
Admitting MD at the bedside. Pt sitting up on stretcher with family at the bedside. States he is feeling much better but is now just very tired. Skin warm and dry. No acute distress noted.

## 2018-09-13 NOTE — ED Notes (Signed)
MD at the bedside  

## 2018-09-13 NOTE — ED Triage Notes (Addendum)
Pt c/o CP xfew hours today with sudden onset of nausea and vomiting x1hr ago. PT hx of irreg HR per pt. PT coughing and vomiting in triage, diaphoretic

## 2018-09-13 NOTE — ED Provider Notes (Signed)
[facility] Emergency Department Provider Note ____________________________________________   First MD Initiated Contact with Patient 09/13/18 1716     (approximate)  I have reviewed the triage vital signs and the nursing notes.   HISTORY  Chief Complaint Chest Pain    HPI Bruce [name] is a 47 y.o. male with PMH as noted below including a history of paroxysmal atrial fibrillation who presents with chest pain, acute onset about 2 hours prior to arrival, occurring while he was having sexual intercourse, and associated with shortness of breath and lightheadedness.  The patient states that he feels like his chest is extremely tight and he has pressure.  He has had chest pain before but not like this, and he states that the symptoms are more severe than he has had during prior episodes of atrial fibrillation.  His last episode of atrial fibrillation was about a week ago but he states it resolved after short time on its own.  He denies any drug use recently, but does say he drank a small bottle of gin and some beer yesterday.  Past Medical History:  Diagnosis Date  . Dental caries   . Hepatitis C infection   . Iritis   . PAF (paroxysmal atrial fibrillation) (HCC)    a. diagnosed 03/19/18; b. CHADS2VASc 0  . Polysubstance abuse (HCC)    a. cocaine, tobacco, etoh  . Scrotal mass   . Tick borne fever   . Uveitis     Patient Active Problem List   Diagnosis Date Noted  . Atrial fibrillation (HCC) 03/19/2018  . Abnormality of gait 02/12/2013  . Mandibular fracture (HCC) 12/25/2012  . Flu vaccine need 12/25/2012  . Scrotal mass 05/22/2012  . Dental caries 05/22/2012  . Hepatitis C infection 05/16/2012  . Myalgia 05/06/2012  . Arthralgia 05/06/2012  . Tick borne fever, likely 05/05/2012  . Tinea cruris, likely 05/05/2012  . Spermatocele of epididymis 05/01/2012  . Bilateral varicoceles 05/01/2012  . Bilateral hydrocele 05/01/2012    Past Surgical History:   Procedure Laterality Date  . FINGER SURGERY     infection, middle left  . fractured jaw    . SHOULDER SURGERY     right    Prior to Admission medications   Medication Sig Start Date End Date Taking? Authorizing Provider  nitroGLYCERIN (NITROSTAT) 0.4 MG SL tablet Place 1 tablet (0.4 mg total) under the tongue every 5 (five) minutes as needed for chest pain. 06/10/18 06/10/19  Phineas Semen, MD    Allergies Penicillins  Family History  Problem Relation Age of Onset  . Drug abuse Father     Social History Social History   Tobacco Use  . Smoking status: Current Some Day Smoker    Types: Cigarettes  . Smokeless tobacco: Never Used  Substance Use Topics  . Alcohol use: Yes    Comment: 6 pack per week  . Drug use: Yes    Types: IV, Cocaine    Review of Systems  Constitutional: No fever. Eyes: No redness. ENT: No sore throat. Cardiovascular: Positive for chest pain. Respiratory: Positive for shortness of breath. Gastrointestinal: No vomiting. Genitourinary: Negative for flank pain.  Musculoskeletal: Negative for back pain. Skin: Negative for rash. Neurological: Negative for headache.   ____________________________________________   PHYSICAL EXAM:  VITAL SIGNS: ED Triage Vitals  Enc Vitals Group     BP 09/13/18 1711 113/79     Pulse Rate 09/13/18 1720 67     Resp 09/13/18 1711 (!) 28  Temp 09/13/18 1711 97.6 F (36.4 C)     Temp Source 09/13/18 1711 Oral     SpO2 09/13/18 1711 100 %     Weight 09/13/18 1706 180 lb (81.6 kg)     Height 09/13/18 1706 5\' 7"  (1.702 m)     Head Circumference --      Peak Flow --      Pain Score 09/13/18 1705 9     Pain Loc --      Pain Edu? --      Excl. in GC? --     Constitutional: Alert and oriented.  Uncomfortable appearing, diaphoretic. Eyes: Conjunctivae are normal.  Head: Atraumatic. Nose: No congestion/rhinnorhea. Mouth/Throat: Mucous membranes are moist.   Neck: Normal range of motion.  Cardiovascular:  Tachycardic, irregular rhythm. Grossly normal heart sounds.  Good peripheral circulation. Respiratory: Somewhat increased respiratory effort.  No retractions. Lungs CTAB. Gastrointestinal: Soft and nontender. No distention.  Genitourinary: No flank tenderness. Musculoskeletal: No lower extremity edema.  Extremities warm and well perfused.  Neurologic:  Normal speech and language. No gross focal neurologic deficits are appreciated.  Skin:  Skin is warm and dry. No rash noted. Psychiatric: Mood and affect are normal. Speech and behavior are normal.  ____________________________________________   LABS (all labs ordered are listed, but only abnormal results are displayed)  Labs Reviewed  BASIC METABOLIC PANEL - Abnormal; Notable for the following components:      Result Value   Glucose, Bld 112 (*)    BUN 5 (*)    All other components within normal limits  CBC - Abnormal; Notable for the following components:   RBC 6.11 (*)    Hemoglobin 18.4 (*)    HCT 53.1 (*)    All other components within normal limits  TROPONIN I - Abnormal; Notable for the following components:   Troponin I 0.03 (*)    All other components within normal limits  URINE DRUG SCREEN, QUALITATIVE ([facility] ONLY)   ____________________________________________  EKG  ED ECG REPORT I, Dionne Bucy, the attending physician, personally viewed and interpreted this ECG.  Date: 09/13/2018 EKG Time: 1708 Rate: 201 Rhythm: Atrial fibrillation with RVR QRS Axis: normal Intervals: normal ST/T Wave abnormalities: Nonspecific abnormalities, likely rate related Narrative Interpretation: Atrial fibrillation with RVR   ED ECG REPORT I, Dionne Bucy, the attending physician, personally viewed and interpreted this ECG.  Date: 09/13/2018 EKG Time: 1748 Rate: 103 Rhythm: Atrial fibrillation QRS Axis: normal Intervals: normal ST/T Wave abnormalities: Borderline repolarization abnormality Narrative  Interpretation: no evidence of acute ischemia   ____________________________________________  RADIOLOGY  CXR: No edema or other acute findings  ____________________________________________   PROCEDURES  Procedure(s) performed: No  Procedures  Critical Care performed: Yes  CRITICAL CARE Performed by: Dionne Bucy   Total critical care time: 60 minutes  Critical care time was exclusive of separately billable procedures and treating other patients.  Critical care was necessary to treat or prevent imminent or life-threatening deterioration.  Critical care was time spent personally by me on the following activities: development of treatment plan with patient and/or surrogate as well as nursing, discussions with consultants, evaluation of patient's response to treatment, examination of patient, obtaining history from patient or surrogate, ordering and performing treatments and interventions, ordering and review of laboratory studies, ordering and review of radiographic studies, pulse oximetry and re-evaluation of patient's condition. ____________________________________________   INITIAL IMPRESSION / ASSESSMENT AND PLAN / ED COURSE  Pertinent labs & imaging results that were available during my care  of the patient were reviewed by me and considered in my medical decision making (see chart for details).  47 year old male with history of paroxysmal atrial fibrillation presents with acute onset of chest pain, shortness of breath, lightheadedness and palpitations 2 hours ago while he was having intercourse.  The patient reports drinking alcohol yesterday, but not today.  He denies drug use recently.  I reviewed the past medical records in epic; the patient has had several prior ED visits over the last year for chest pain without rapid atrial fibrillation, and it was thought to be likely related to cocaine use.  He had an echocardiogram in May 2019 which showed normal EF.  On  exam, the patient was initially extremely uncomfortable appearing, diaphoretic, speaking in short sentences, and clutching his shirt of his companion who was standing next to the stretcher.  His heart rate was in the 180s to 200 but he was maintaining a good blood pressure.  EKG was consistent with atrial fibrillation with RVR.  Although the patient was diaphoretic and having active chest pain, given the fact that his blood pressure was good I elected to try rate control with IV Cardizem rather than immediately going to cardioversion.  The patient got two doses of IV Cardizem with some response, and significant response on the third dose.  A drip was initiated.  His symptoms have significantly improved and his blood pressure has remained stable.  Presentation is consistent with atrial fibrillation with RVR, likely precipitated by exertion and possibly related to alcohol use yesterday.  The patient states that he has been referred to cardiology but has never made an appointment.  Based on his response to the Cardizem and his clinical course of the next 1 to 2 hours we will decide on disposition.  If the patient returned to sinus rhythm and is asymptomatic, I may consider discharge with close follow-up.  If he remains in atrial fibrillation I will admit.  ----------------------------------------- 7:16 PM on 09/13/2018 -----------------------------------------  The patient is rate controlled on Cardizem drip and his symptoms have significantly improved.  The patient was still having some chest pain, so I gave IV Ativan in case there may be some vasospasm related to cocaine (although patient denied using it today).  His lab work-up is unremarkable except for minimally elevated troponin.  Given that he is still in atrial fibrillation on a drip he will require admission for further rate control and work-up.  He agrees with this plan.  I signed the patient out to the  hospitalist. ____________________________________________   FINAL CLINICAL IMPRESSION(S) / ED DIAGNOSES  Final diagnoses:  Atrial fibrillation with RVR (HCC)  Chest pain, unspecified type      NEW MEDICATIONS STARTED DURING THIS VISIT:  New Prescriptions   No medications on file     Note:  This document was prepared using Dragon voice recognition software and may include unintentional dictation errors.    Dionne Bucy, MD 09/13/18 [phone]

## 2018-09-14 DIAGNOSIS — F17200 Nicotine dependence, unspecified, uncomplicated: Secondary | ICD-10-CM

## 2018-09-14 DIAGNOSIS — R079 Chest pain, unspecified: Secondary | ICD-10-CM

## 2018-09-14 DIAGNOSIS — F1910 Other psychoactive substance abuse, uncomplicated: Secondary | ICD-10-CM

## 2018-09-14 DIAGNOSIS — I4891 Unspecified atrial fibrillation: Secondary | ICD-10-CM

## 2018-09-14 DIAGNOSIS — B182 Chronic viral hepatitis C: Secondary | ICD-10-CM

## 2018-09-14 LAB — TROPONIN I
TROPONIN I: 0.03 ng/mL — AB (ref <0.03)
Troponin I: 0.03 ng/mL (ref <0.03)

## 2018-09-14 MED ORDER — SODIUM CHLORIDE 0.9% FLUSH
3.0000 mL | Freq: Two times a day (BID) | INTRAVENOUS | Status: DC
  Administered 2018-09-14: 3 mL via INTRAVENOUS

## 2018-09-14 MED ORDER — DILTIAZEM HCL ER COATED BEADS 180 MG PO CP24: 180.0000 mg | ORAL_CAPSULE | Freq: Every day | ORAL | 1 refills | Status: DC

## 2018-09-14 MED ORDER — FLECAINIDE ACETATE 50 MG PO TABS: 50.0000 mg | ORAL_TABLET | Freq: Two times a day (BID) | ORAL | 1 refills | Status: DC

## 2018-09-14 MED ORDER — DILTIAZEM HCL ER COATED BEADS 180 MG PO CP24
180.0000 mg | ORAL_CAPSULE | Freq: Every day | ORAL | Status: DC
  Administered 2018-09-14: 180 mg via ORAL
  Filled 2018-09-14: qty 1

## 2018-09-14 MED ORDER — FLECAINIDE ACETATE 50 MG PO TABS
50.0000 mg | ORAL_TABLET | Freq: Two times a day (BID) | ORAL | Status: DC
  Administered 2018-09-14: 50 mg via ORAL
  Filled 2018-09-14 (×2): qty 1

## 2018-09-14 NOTE — Progress Notes (Signed)
Discharged to home with a family member.  Prescriptions for new medications.  Follow up appointment made for Dr. Mariah Milling

## 2018-09-14 NOTE — Discharge Summary (Signed)
[facility] [place] [facility]   PATIENT NAME: Bruce [name]    MR#:  [id number]  DATE OF BIRTH:  1970/12/13  DATE OF ADMISSION:  09/13/2018 ADMITTING PHYSICIAN: Auburn Bilberry, MD  DATE OF DISCHARGE: 09/14/2018  PRIMARY CARE PHYSICIAN: Patient, No Pcp Per    ADMISSION DIAGNOSIS:  Atrial fibrillation with RVR (HCC) [I48.91] Chest pain [R07.9] Chest pain, unspecified type [R07.9]  DISCHARGE DIAGNOSIS:  Active Problems:   A-fib (HCC)   SECONDARY DIAGNOSIS:   Past Medical History:  Diagnosis Date  . Dental caries   . Hepatitis C infection   . Iritis   . PAF (paroxysmal atrial fibrillation) (HCC)    a. diagnosed 03/19/18; b. CHADS2VASc 0  . Polysubstance abuse (HCC)    a. cocaine, tobacco, etoh  . Scrotal mass   . Tick borne fever   . Uveitis     HOSPITAL COURSE:   47 year old male with past medical history of substance abuse, hepatitis C, paroxysmal atrial fibrillation who presented to the hospital due to chest pain or palpitations.  1.  Atrial fibrillation with rapid ventricular response-this was the cause of patient's chest pain and palpitations. - Patient was previously in the hospital in May of this past year with similar symptoms but was not discharged on any medications due to his history of substance abuse and relative hypotension.  He now returns back with similar symptoms. - Patient received some IV Cardizem and was placed on a Cardizem drip and has now converted to normal sinus rhythm.  -Seen by cardiology and since patient is quite symptomatic with his paroxysmal atrial fibrillation did recommend medical management with oral flecainide and Cardizem.  Patient is being discharged on those medications.  His chads score is 0 therefore he is not on any anticoagulation presently.  Patient will follow-up with cardiology in 2 weeks.  2.  Elevated troponin-likely secondary supply demand ischemia from the A. fib with RVR.  Troponins did not trend  upwards.     DISCHARGE CONDITIONS:   Stable.   CONSULTS OBTAINED:  Treatment Team:  Antonieta Iba, MD  DRUG ALLERGIES:   Allergies  Allergen Reactions  . Penicillins Other (See Comments)    Childhood reaction    DISCHARGE MEDICATIONS:   Allergies as of 09/14/2018      Reactions   Penicillins Other (See Comments)   Childhood reaction      Medication List    STOP taking these medications   nitroGLYCERIN 0.4 MG SL tablet Commonly known as:  NITROSTAT     TAKE these medications   diltiazem 180 MG 24 hr capsule Commonly known as:  CARDIZEM CD Take 1 capsule (180 mg total) by mouth daily.   flecainide 50 MG tablet Commonly known as:  TAMBOCOR Take 1 tablet (50 mg total) by mouth every 12 (twelve) hours.         DISCHARGE INSTRUCTIONS:   DIET:  Cardiac diet  DISCHARGE CONDITION:  Stable  ACTIVITY:  Activity as tolerated  OXYGEN:  Home Oxygen: No.   Oxygen Delivery: room air  DISCHARGE LOCATION:  home   If you experience worsening of your admission symptoms, develop shortness of breath, life threatening emergency, suicidal or homicidal thoughts you must seek medical attention immediately by calling 911 or calling your MD immediately  if symptoms less severe.  You Must read complete instructions/literature along with all the possible adverse reactions/side effects for all the Medicines you take and that have been prescribed to you. Take any new  Medicines after you have completely understood and accpet all the possible adverse reactions/side effects.   Please note  You were cared for by a hospitalist during your hospital stay. If you have any questions about your discharge medications or the care you received while you were in the hospital after you are discharged, you can call the unit and asked to speak with the hospitalist on call if the hospitalist that took care of you is not available. Once you are discharged, your primary care physician will  handle any further medical issues. Please note that NO REFILLS for any discharge medications will be authorized once you are discharged, as it is imperative that you return to your primary care physician (or establish a relationship with a primary care physician if you do not have one) for your aftercare needs so that they can reassess your need for medications and monitor your lab values.     Today   No acute events overnight, still having some intermittent chest pain but remains in his normal sinus rhythm.  Seen by cardiology who recommend medical management for his paroxysmal A. fib with oral Cardizem and flecainide.  Patient is being discharged on that.  VITAL SIGNS:  Blood pressure (!) 137/94, pulse 79, temperature 98.4 F (36.9 C), resp. rate 20, height 5\' 7"  (1.702 m), weight 75.9 kg, SpO2 99 %.  I/O:    Intake/Output Summary (Last 24 hours) at 09/14/2018 1456 Last data filed at 09/14/2018 1420 Gross per 24 hour  Intake 636.37 ml  Output 450 ml  Net 186.37 ml    PHYSICAL EXAMINATION:  GENERAL:  47 y.o.-year-old patient lying in the bed with no acute distress.  EYES: Pupils equal, round, reactive to light and accommodation. No scleral icterus. Extraocular muscles intact.  HEENT: Head atraumatic, normocephalic. Oropharynx and nasopharynx clear.  NECK:  Supple, no jugular venous distention. No thyroid enlargement, no tenderness.  LUNGS: Normal breath sounds bilaterally, no wheezing, rales,rhonchi. No use of accessory muscles of respiration.  CARDIOVASCULAR: S1, S2 normal. No murmurs, rubs, or gallops.  ABDOMEN: Soft, non-tender, non-distended. Bowel sounds present. No organomegaly or mass.  EXTREMITIES: No pedal edema, cyanosis, or clubbing.  NEUROLOGIC: Cranial nerves II through XII are intact. No focal motor or sensory defecits b/l.  PSYCHIATRIC: The patient is alert and oriented x 3. SKIN: No obvious rash, lesion, or ulcer.   DATA REVIEW:   CBC Recent Labs  Lab  09/13/18 1712  WBC 4.9  HGB 18.4*  HCT 53.1*  PLT 253    Chemistries  Recent Labs  Lab 09/13/18 1712  NA 143  K 3.7  CL 103  CO2 28  GLUCOSE 112*  BUN 5*  CREATININE 0.90  CALCIUM 9.4    Cardiac Enzymes Recent Labs  Lab 09/14/18 0857  TROPONINI 0.03*      RADIOLOGY:  Dg Chest Port 1 View  Result Date: 09/13/2018 CLINICAL DATA:  Acute onset nausea and vomiting 1 hour ago. Cough. History of polysubstance abuse. EXAM: PORTABLE CHEST 1 VIEW COMPARISON:  Chest radiograph August 19, 2018. FINDINGS: Cardiac silhouette is upper limits of normal in size. Mediastinal silhouette is unremarkable. No pleural effusions or focal consolidations. Trachea projects midline and there is no pneumothorax. Soft tissue planes and included osseous structures are non-suspicious. IMPRESSION: Borderline cardiomegaly.  No acute pulmonary process. Electronically Signed   By: Awilda Metro M.D.   On: 09/13/2018 17:46      Management plans discussed with the patient, family and they are in agreement.  CODE STATUS:     Code Status Orders  (From admission, onward)         Start     Ordered   09/13/18 2100  Full code  Continuous     09/13/18 2059          TOTAL TIME TAKING CARE OF THIS PATIENT: 40 minutes.    Houston Siren M.D on 09/14/2018 at 2:56 PM  Between 7am to 6pm - Pager - [phone]  After 6pm go to [email or web address] - Social research officer, government  [facility]  Office  [phone]  CC: Primary care physician; Patient, No Pcp Per

## 2018-09-14 NOTE — Progress Notes (Signed)
Cardiology Consultation:   [name] MRN: [id number]; DOB: [birth year]  Admit date: 09/13/2018 Date of Consult: 09/14/2018  Primary Care Provider: Patient, No Pcp Per Primary Cardiologist: New to [facility] Physician requesting consult: S. Patel Reason for consult: Atrial fibrillation with RVR   Patient Profile:   Bruce [name] is a 47 y.o. male with a hx of polysubstance abuse with ongoing cocaine, alcohol, and tobacco abuse, and hepatitis C stemming from IV drug abuse who is being seen today for the evaluation of new onset Afib with RVR  History of Present Illness:    incarcerated 2016. He reports hepatitis C stemming from his dad giving him IV drugs.  On prior hospital admission reported regular heavy drinking, smoking  03/17/18 he was celebrating his birthday and went on a binge drinking weekend with friends. While celebrating, he was using cocaine on a daily basis. Shortly after waking up on 03/19/18 he developed atrial fibrillation, chest discomfort, presented to the emergency room, converted on Cardizem infusion  Since that time he reports regular episodes of atrial fibrillation several per week He did not seek out medical attention Reports having at least 3 episodes this week, Was making love with girlfriend last night acutely developed tachycardia,  Did not resolve with his usual methods of getting up, moving around, Symptoms were severe and presented to the emergency room  On arrival EKG showing atrial for ablation with RVR rate 200 bpm Had nausea, vomiting, general malaise, shortness of breath, dizziness  In the emergency room given IV digoxin, IV Cardizem, infusion started Improvement in rate down to 100 bpm  Overnight on Cardizem infusion, converted to normal sinus rhythm This morning feels better but very concerned he will continue to have paroxysmal episodes as he has had over the past several weeks to months   Past Medical History:  Diagnosis Date  .  Dental caries   . Hepatitis C infection   . Iritis   . PAF (paroxysmal atrial fibrillation) (HCC)    a. diagnosed 03/19/18; b. CHADS2VASc 0  . Polysubstance abuse (HCC)    a. cocaine, tobacco, etoh  . Scrotal mass   . Tick borne fever   . Uveitis     Past Surgical History:  Procedure Laterality Date  . FINGER SURGERY     infection, middle left  . fractured jaw    . SHOULDER SURGERY     right     Home Medications:  Prior to Admission medications   Medication Sig Start Date End Date Taking? Authorizing Provider  nitroGLYCERIN (NITROSTAT) 0.4 MG SL tablet Place 1 tablet (0.4 mg total) under the tongue every 5 (five) minutes as needed for chest pain. Patient not taking: Reported on 09/13/2018 06/10/18 06/10/19  Phineas Semen, MD    Inpatient Medications: Scheduled Meds: . diltiazem  180 mg Oral Daily  . enoxaparin (LOVENOX) injection  40 mg Subcutaneous Q24H  . flecainide  50 mg Oral Q12H  . sodium chloride flush  3 mL Intravenous Q12H   Continuous Infusions: . sodium chloride Stopped (09/14/18 1427)   PRN Meds: acetaminophen **OR** acetaminophen, HYDROcodone-acetaminophen, ondansetron **OR** ondansetron (ZOFRAN) IV  Allergies:    Allergies  Allergen Reactions  . Penicillins Other (See Comments)    Childhood reaction    Social History:   Social History   Socioeconomic History  . Marital status: Single    Spouse name: Not on file  . Number of children: 0  . Years of education: Not on file  . Highest education  level: Not on file  Occupational History    Employer: UNEMPLOYE  Social Needs  . Financial resource strain: Not hard at all  . Food insecurity:    Worry: Never true    Inability: Never true  . Transportation needs:    Medical: No    Non-medical: Yes  Tobacco Use  . Smoking status: Current Some Day Smoker    Packs/day: 0.25    Types: Cigarettes  . Smokeless tobacco: Never Used  . Tobacco comment: Per pt  " I only smoke cigarettes when I drink beer"    Substance and Sexual Activity  . Alcohol use: Yes    Comment: 6 pack per week  . Drug use: Not Currently    Types: IV, Cocaine  . Sexual activity: Not on file  Lifestyle  . Physical activity:    Days per week: 7 days    Minutes per session: 30 min  . Stress: To some extent  Relationships  . Social connections:    Talks on phone: More than three times a week    Gets together: More than three times a week    Attends religious service: More than 4 times per year    Active member of club or organization: No    Attends meetings of clubs or organizations: Never    Relationship status: Living with partner  . Intimate partner violence:    Fear of current or ex partner: No    Emotionally abused: No    Physically abused: No    Forced sexual activity: No  Other Topics Concern  . Not on file  Social History Narrative  . Not on file    Family History:    Family History  Problem Relation Age of Onset  . Drug abuse Father      ROS:  Please see the history of present illness.  Review of Systems  Constitutional: Negative.   Respiratory: Positive for shortness of breath.   Cardiovascular: Positive for palpitations.       Tachycardia  Gastrointestinal: Negative.   Musculoskeletal: Negative.        Tender area on the back of his neck  Neurological: Negative.   Psychiatric/Behavioral: Negative.   All other systems reviewed and are negative.   Physical Exam/Data:   Vitals:   09/14/18 0000 09/14/18 0353 09/14/18 0500 09/14/18 0834  BP: 117/78 106/66  (!) 137/94  Pulse: 67 (!) 56  79  Resp: 18 18  20   Temp: 98.3 F (36.8 C) 98.2 F (36.8 C)  98.4 F (36.9 C)  TempSrc: Oral     SpO2: 98% 100%  99%  Weight:   75.9 kg   Height:        Intake/Output Summary (Last 24 hours) at 09/14/2018 1509 Last data filed at 09/14/2018 1420 Gross per 24 hour  Intake 636.37 ml  Output 450 ml  Net 186.37 ml   Filed Weights   09/13/18 1706 09/14/18 0500  Weight: 81.6 kg 75.9 kg    Body mass index is 26.22 kg/m.  General:  Well nourished, well developed, in no acute distress HEENT: normal Lymph: no adenopathy Neck: no JVD, small mobile nontender confluence on the back of his neck Endocrine:  No thryomegaly Vascular: No carotid bruits; FA pulses 2+ bilaterally without bruits  Cardiac:  normal S1, S2; RRR; no murmur  Lungs:  clear to auscultation bilaterally, no wheezing, rhonchi or rales  Abd: soft, nontender, no hepatomegaly  Ext: no edema Musculoskeletal:  No deformities, BUE  and BLE strength normal and equal Skin: warm and dry  Neuro:  CNs 2-12 intact, no focal abnormalities noted Psych:  Normal affect   EKG:  The EKG was personally reviewed and demonstrates:   Atrial fibrillation with RVR rate 200 bpm  Telemetry:  Telemetry was personally reviewed and demonstrates:  Normal sinus rhythm  Relevant CV Studies:  Echocardiogram Mar 20, 2018 Left ventricle: The cavity size was normal. Systolic function was   normal. The estimated ejection fraction was in the range of 60%   to 65%. Wall motion was normal; there were no regional wall   motion abnormalities. Features are consistent with a pseudonormal   left ventricular filling pattern, with concomitant abnormal   relaxation and increased filling pressure (grade 2 diastolic   dysfunction). - Mitral valve: There was mild regurgitation. - Left atrium: The atrium was normal in size. - Right ventricle: Systolic function was normal. - Pulmonary arteries: Systolic pressure was within the normal   range.  Laboratory Data:  Chemistry Recent Labs  Lab 09/13/18 1712  NA 143  K 3.7  CL 103  CO2 28  GLUCOSE 112*  BUN 5*  CREATININE 0.90  CALCIUM 9.4  GFRNONAA >60  GFRAA >60  ANIONGAP 12    No results for input(s): PROT, ALBUMIN, AST, ALT, ALKPHOS, BILITOT in the last 168 hours. Hematology Recent Labs  Lab 09/13/18 1712  WBC 4.9  RBC 6.11*  HGB 18.4*  HCT 53.1*  MCV 86.9  MCH 30.1  MCHC  34.7  RDW 12.8  PLT 253   Cardiac Enzymes Recent Labs  Lab 09/13/18 1712 09/13/18 2131 09/14/18 0300 09/14/18 0857  TROPONINI 0.03* 0.03* 0.03* 0.03*   No results for input(s): TROPIPOC in the last 168 hours.  BNPNo results for input(s): BNP, PROBNP in the last 168 hours.  DDimer No results for input(s): DDIMER in the last 168 hours.  Radiology/Studies:  Dg Chest Port 1 View  Result Date: 09/13/2018 CLINICAL DATA:  Acute onset nausea and vomiting 1 hour ago. Cough. History of polysubstance abuse. EXAM: PORTABLE CHEST 1 VIEW COMPARISON:  Chest radiograph August 19, 2018. FINDINGS: Cardiac silhouette is upper limits of normal in size. Mediastinal silhouette is unremarkable. No pleural effusions or focal consolidations. Trachea projects midline and there is no pneumothorax. Soft tissue planes and included osseous structures are non-suspicious. IMPRESSION: Borderline cardiomegaly.  No acute pulmonary process. Electronically Signed   By: Awilda Metro M.D.   On: 09/13/2018 17:46    Assessment and Plan:   1. Atrial fibrillation with RVR He reports frequent episodes paroxysmal atrial fibrillation, very symptomatic Worst episode this admission, rate up to 200, did not break with his usual maneuvers Periodic alcohol, no recent cocaine -Recommend we continue diltiazem 24-hour extended release 180 mg daily We will also start flecainide 50 twice daily Close follow-up in clinic 2 weeks,  Will require routine exercise treadmill after starting flecainide chads vasc low  2.  Chronic chest pain Atypical in nature, likely more musculoskeletal No ischemic work-up at this time  3.  Polysubstance abuse History of cocaine, alcohol Opiate positive drug screen on arrival Discussed how these substances can contribute to cardiac disease Recommended cessation  4. Smoker We have encouraged him to continue to work on weaning his cigarettes and smoking cessation. He will continue to work on this  and does not want any assistance with chantix.   5.  Hepatitis C Recommended he establish care with PMD in [place] where he lives Would benefit from referral  to ID as outpatient    Total encounter time more than 110 minutes  Greater than 50% was spent in counseling and coordination of care with the patient   For questions or updates, please contact [facility] Please consult [email or web address] for contact info under     Signed, Julien Nordmann, MD  09/14/2018 3:09 PM

## 2018-09-17 ENCOUNTER — Emergency Department
Admission: EM | Admit: 2018-09-17 | Discharge: 2018-09-17 | Disposition: A | Discharge status: Home / Self Care | Payer: Self-pay | Attending: Emergency Medicine | Admitting: Emergency Medicine

## 2018-09-17 ENCOUNTER — Encounter: Payer: Self-pay | Admitting: Emergency Medicine

## 2018-09-17 ENCOUNTER — Other Ambulatory Visit: Payer: Self-pay

## 2018-09-17 ENCOUNTER — Emergency Department: Payer: Self-pay

## 2018-09-17 DIAGNOSIS — R0602 Shortness of breath: Secondary | ICD-10-CM | POA: Insufficient documentation

## 2018-09-17 DIAGNOSIS — R079 Chest pain, unspecified: Secondary | ICD-10-CM | POA: Insufficient documentation

## 2018-09-17 DIAGNOSIS — F1410 Cocaine abuse, uncomplicated: Secondary | ICD-10-CM | POA: Insufficient documentation

## 2018-09-17 DIAGNOSIS — F17210 Nicotine dependence, cigarettes, uncomplicated: Secondary | ICD-10-CM | POA: Insufficient documentation

## 2018-09-17 DIAGNOSIS — I480 Paroxysmal atrial fibrillation: Secondary | ICD-10-CM | POA: Insufficient documentation

## 2018-09-17 LAB — BASIC METABOLIC PANEL
ANION GAP: 13 (ref 5–15)
BUN: 8 mg/dL (ref 6–20)
CALCIUM: 8.7 mg/dL — AB (ref 8.9–10.3)
CO2: 26 mmol/L (ref 22–32)
CREATININE: 0.74 mg/dL (ref 0.61–1.24)
Chloride: 99 mmol/L (ref 98–111)
GFR calc Af Amer: >60 mL/min (ref >60)
GFR calc non Af Amer: >60 mL/min (ref >60)
GLUCOSE: 104 mg/dL — AB (ref 70–99)
Potassium: 3.3 mmol/L — ABNORMAL LOW (ref 3.5–5.1)
Sodium: 138 mmol/L (ref 135–145)

## 2018-09-17 LAB — CBC
HEMATOCRIT: 44.9 % (ref 39.0–52.0)
Hemoglobin: 16 g/dL (ref 13.0–17.0)
MCH: 30.9 pg (ref 26.0–34.0)
MCHC: 35.6 g/dL (ref 30.0–36.0)
MCV: 86.7 fL (ref 80.0–100.0)
PLATELETS: 171 10*3/uL (ref 150–400)
RBC: 5.18 MIL/uL (ref 4.22–5.81)
RDW: 12.7 % (ref 11.5–15.5)
WBC: 4.1 10*3/uL (ref 4.0–10.5)
nRBC: 0 % (ref 0.0–0.2)

## 2018-09-17 LAB — URINE DRUG SCREEN, QUALITATIVE (ARMC ONLY)
AMPHETAMINES, UR SCREEN: NOT DETECTED
BENZODIAZEPINE, UR SCRN: NOT DETECTED
Barbiturates, Ur Screen: NOT DETECTED
COCAINE METABOLITE, UR ~~LOC~~: POSITIVE — AB
Cannabinoid 50 Ng, Ur ~~LOC~~: NOT DETECTED
MDMA (Ecstasy)Ur Screen: NOT DETECTED
METHADONE SCREEN, URINE: NOT DETECTED
OPIATE, UR SCREEN: NOT DETECTED
Phencyclidine (PCP) Ur S: NOT DETECTED
Tricyclic, Ur Screen: NOT DETECTED

## 2018-09-17 LAB — TROPONIN I
Troponin I: 0.03 ng/mL (ref <0.03)
Troponin I: 0.03 ng/mL (ref <0.03)

## 2018-09-17 MED ORDER — DIAZEPAM 5 MG PO TABS: 5.0000 mg | ORAL_TABLET | Freq: Three times a day (TID) | ORAL | 0 refills | Status: DC | PRN

## 2018-09-17 MED ORDER — DIAZEPAM 5 MG PO TABS
10.0000 mg | ORAL_TABLET | Freq: Once | ORAL | Status: AC
  Administered 2018-09-17: 10 mg via ORAL
  Filled 2018-09-17: qty 2

## 2018-09-17 NOTE — ED Provider Notes (Signed)
[facility] Emergency Department Provider Note       Time seen: ----------------------------------------- 2:19 PM on 09/17/2018 -----------------------------------------   I have reviewed the triage vital signs and the nursing notes.  HISTORY   Chief Complaint Chest Pain    HPI Bruce [name] is a 47 y.o. male with a history of hepatitis C, paroxysmal atrial fibrillation, substance abuse who presents to the ED for left-sided chest pain that started last night.  Patient describes it as constant, he was admitted earlier this month for atrial fibrillation and similar symptoms.  Today the pain is in his left chest and arm.  He had complained of some shortness of breath.  Past Medical History:  Diagnosis Date  . Dental caries   . Hepatitis C infection   . Iritis   . PAF (paroxysmal atrial fibrillation) (HCC)    a. diagnosed 03/19/18; b. CHADS2VASc 0  . Polysubstance abuse (HCC)    a. cocaine, tobacco, etoh  . Scrotal mass   . Tick borne fever   . Uveitis     Patient Active Problem List   Diagnosis Date Noted  . A-fib (HCC) 09/13/2018  . Atrial fibrillation (HCC) 03/19/2018  . Abnormality of gait 02/12/2013  . Mandibular fracture (HCC) 12/25/2012  . Flu vaccine need 12/25/2012  . Scrotal mass 05/22/2012  . Dental caries 05/22/2012  . Hepatitis C infection 05/16/2012  . Myalgia 05/06/2012  . Arthralgia 05/06/2012  . Tick borne fever, likely 05/05/2012  . Tinea cruris, likely 05/05/2012  . Spermatocele of epididymis 05/01/2012  . Bilateral varicoceles 05/01/2012  . Bilateral hydrocele 05/01/2012    Past Surgical History:  Procedure Laterality Date  . FINGER SURGERY     infection, middle left  . fractured jaw    . SHOULDER SURGERY     right    Allergies Penicillins  Social History Social History   Tobacco Use  . Smoking status: Current Some Day Smoker    Packs/day: 0.25    Types: Cigarettes  . Smokeless tobacco: Never Used  .  Tobacco comment: Per pt  " I only smoke cigarettes when I drink beer"  Substance Use Topics  . Alcohol use: Yes    Comment: 6 pack per week  . Drug use: Not Currently    Types: IV, Cocaine   Review of Systems Constitutional: Negative for fever. Cardiovascular: Positive for chest pain Respiratory: Positive for shortness of breath Gastrointestinal: Negative for abdominal pain, vomiting and diarrhea. Musculoskeletal: Negative for back pain. Skin: Negative for rash. Neurological: Negative for headaches, focal weakness or numbness.  All systems negative/normal/unremarkable except as stated in the HPI  ____________________________________________   PHYSICAL EXAM:  VITAL SIGNS: ED Triage Vitals  Enc Vitals Group     BP 09/17/18 1037 133/84     Pulse Rate 09/17/18 1037 68     Resp 09/17/18 1037 20     Temp 09/17/18 1037 97.8 F (36.6 C)     Temp Source 09/17/18 1037 Oral     SpO2 09/17/18 1037 98 %     Weight 09/17/18 1039 167 lb 6.4 oz (75.9 kg)     Height 09/17/18 1039 5\' 7"  (1.702 m)     Head Circumference --      Peak Flow --      Pain Score 09/17/18 1038 7     Pain Loc --      Pain Edu? --      Excl. in GC? --    Constitutional: Alert and oriented.  Well appearing and in no distress. Cardiovascular: Normal rate, regular rhythm. No murmurs, rubs, or gallops. Respiratory: Normal respiratory effort without tachypnea nor retractions. Breath sounds are clear and equal bilaterally. No wheezes/rales/rhonchi. Gastrointestinal: Soft and nontender. Normal bowel sounds Musculoskeletal: Nontender with normal range of motion in extremities. No lower extremity tenderness nor edema. Neurologic:  Normal speech and language. No gross focal neurologic deficits are appreciated.  Skin:  Skin is warm, dry and intact. No rash noted. Psychiatric: Mood and affect are normal. Speech and behavior are normal.  ____________________________________________  EKG: Interpreted by me.  Sinus rhythm  rate 76 bpm, normal PR interval, normal QRS, normal QT.  ____________________________________________  ED COURSE:  As part of my medical decision making, I reviewed the following data within the electronic MEDICAL RECORD NUMBER History obtained from family if available, nursing notes, old chart and ekg, as well as notes from prior ED visits. Patient presented for chest pain, we will assess with labs and imaging as indicated at this time.   Procedures ____________________________________________   LABS (pertinent positives/negatives)  Labs Reviewed  BASIC METABOLIC PANEL - Abnormal; Notable for the following components:      Result Value   Potassium 3.3 (*)    Glucose, Bld 104 (*)    Calcium 8.7 (*)    All other components within normal limits  TROPONIN I - Abnormal; Notable for the following components:   Troponin I 0.03 (*)    All other components within normal limits  URINE DRUG SCREEN, QUALITATIVE ([facility] ONLY) - Abnormal; Notable for the following components:   Cocaine Metabolite,Ur [place] POSITIVE (*)    All other components within normal limits  TROPONIN I - Abnormal; Notable for the following components:   Troponin I 0.03 (*)    All other components within normal limits  CBC    RADIOLOGY Images were viewed by me  Chest x-ray is unremarkable  ____________________________________________  DIFFERENTIAL DIAGNOSIS   Musculoskeletal pain, GERD, anxiety, MI, unstable angina, cocaine abuse  FINAL ASSESSMENT AND PLAN  Chest pain   Plan: The patient had presented for nonspecific chest pain. Patient's labs we will do chronically elevated troponin at 0.03. Patient's imaging is negative for any acute process.  This is likely cocaine induced chest pain.  He is cleared for outpatient follow-up with cardiology    Ulice Dash, MD   Note: This note was generated in part or whole with voice recognition software. Voice recognition is usually quite accurate but there are  transcription errors that can and very often do occur. I apologize for any typographical errors that were not detected and corrected.     Emily Filbert, MD 09/17/18 [phone]

## 2018-09-17 NOTE — ED Triage Notes (Signed)
Pt c/o left side chest pain starting last night. Describes as constant. Same pain as when admitted earlier this month for afib. HR NSR today. Pain in left chest and arm.  C/o SHOB. Unlabored currently. VSS. NAD

## 2018-09-17 NOTE — ED Notes (Signed)
First Nurse Note: Patient complaining of centralized chest pain starting last PM with nausea.  Taken to Triage 1 for EKG.

## 2018-09-18 ENCOUNTER — Telehealth: Payer: Self-pay | Admitting: *Deleted

## 2018-09-18 DIAGNOSIS — R079 Chest pain, unspecified: Secondary | ICD-10-CM

## 2018-09-18 NOTE — Telephone Encounter (Signed)
Called first number and received message that "the subscriber you're trying to reach is no longer in service."  Second number listed, message left to call back.   Order for [employer] entered.

## 2018-09-18 NOTE — Telephone Encounter (Signed)
-----   Message from Antonieta Ibaimothy J Gollan, MD sent at 09/17/2018  3:58 PM EST ----- Recently seen in the emergency room for atrial fibrillation Had a return visit for chest pain which is chronic, seems to come on whenever he does cocaine Emergency room would like us to work-up chest pain Can we call him to see if he would allow a pharmacologic Myoview, lexiscan. Would also recommend he avoid cocaine which can cause heart attacks and spasm thx TG

## 2018-09-20 NOTE — Telephone Encounter (Signed)
I left a message for the patient to call back at his home #.  Message received on cell #- "subscriber you're trying to reach is no longer in service."

## 2018-09-21 ENCOUNTER — Telehealth: Payer: Self-pay

## 2018-09-21 NOTE — Telephone Encounter (Signed)
Flagged on EMMI report for being unsure if he received new prescriptions.  First attempt to reach patient made, however unable to reach patient.  Left voicemail encouraging callback. Will attempt at later time.

## 2018-09-24 NOTE — Telephone Encounter (Signed)
Patient calling back. Hirra scheduled Lexiscan appointment prior to transferring call to me. Patient verbalized understanding of the following description and pre-procedural instructions.   [facility] MYOVIEW  Your caregiver has ordered a Stress Test with nuclear imaging. The purpose of this test is to evaluate the blood supply to your heart muscle. This procedure is referred to as a "Non-Invasive Stress Test." This is because other than having an IV started in your vein, nothing is inserted or "invades" your body. Cardiac stress tests are done to find areas of poor blood flow to the heart by determining the extent of coronary artery disease (CAD). Some patients exercise on a treadmill, which naturally increases the blood flow to your heart, while others who are  unable to walk on a treadmill due to physical limitations have a pharmacologic/chemical stress agent called Lexiscan . This medicine will mimic walking on a treadmill by temporarily increasing your coronary blood flow.   Please note: these test may take anywhere between 2-4 hours to complete  [facility]  THE VOLUNTEERS AT THE FIRST DESK WILL DIRECT YOU WHERE TO GO  Date of Procedure:______11/20/19_________________  Arrival Time for Procedure:_____07:45 am_____   PLEASE NOTIFY THE OFFICE AT LEAST 24 HOURS IN ADVANCE IF YOU ARE UNABLE TO KEEP YOUR APPOINTMENT.  [phone] AND  PLEASE NOTIFY N[facility]AR MEDIC[facility] AT [facility] AT LEAST 24 HOURS IN ADVANCE IF YOU ARE UNABLE TO KEEP YOUR APPOINTMENT. [phone]  How to prepare for your Myoview test:  1. Do not eat or drink after midnight 2. No caffeine for 24 hours prior to test 3. No smoking 24 hours prior to test. 4. Your medication may be taken with water.  If your doctor stopped a medication because of this test, do not take that medication. 5. Skirts or pants are appropriate. Please wear a short sleeve shirt. 6. No perfume, cologne or lotion. Wear comfortable  walking shoes.

## 2018-09-24 NOTE — Telephone Encounter (Signed)
Reached patient upon second attempt.  Reports he his taking his medications prescribed, though mentioned one makes him itch at times.  Said the itching went away after taking some Benadryl.  Encouraged him to let his doctor know next time he sees them.  Reports doing okay.  No further questions or concerns at this time.  I thanked him for his time.

## 2018-09-26 ENCOUNTER — Encounter: Payer: Self-pay | Attending: Cardiovascular Disease

## 2018-10-01 NOTE — Progress Notes (Deleted)
   Cardiology Office Note Date:  10/01/2018  Patient ID:  Bruce [name], DOB Oct 10, 1971, MRN [id number] PCP:  Default, Provider, MD  Cardiologist:  Dr. Mariah MillingGollan, MD  ***refresh   Chief Complaint: Hospital follow up  History of Present Illness: Bruce [name] is a 47 y.o. male with history of ***   Past Medical History:  Diagnosis Date  . Dental caries   . Hepatitis C infection   . Iritis   . PAF (paroxysmal atrial fibrillation) (HCC)    a. diagnosed 03/19/18; b. CHADS2VASc 0  . Polysubstance abuse (HCC)    a. cocaine, tobacco, etoh  . Scrotal mass   . Tick borne fever   . Uveitis     Past Surgical History:  Procedure Laterality Date  . FINGER SURGERY     infection, middle left  . fractured jaw    . SHOULDER SURGERY     right    No outpatient medications have been marked as taking for the 10/02/18 encounter (Appointment) with [name],  M, PA-C.    Allergies:   Penicillins   Social History:  The patient  reports that he has been smoking cigarettes. He has been smoking about 0.25 packs per day. He has never used smokeless tobacco. He reports that he drinks alcohol. He reports that he has current or past drug history. Drugs: IV and Cocaine.   Family History:  The patient's family history includes Drug abuse in his father.  ROS:   ROS   PHYSICAL EXAM: *** VS:  There were no vitals taken for this visit. BMI: There is no height or weight on file to calculate BMI.  Physical Exam   EKG:  Was ordered and interpreted by me today. Shows ***  Recent Labs: 03/19/2018: Magnesium 2.0 05/28/2018: ALT 151 09/13/2018: TSH 1.002 09/17/2018: BUN 8; Creatinine, Ser 0.74; Hemoglobin 16.0; Platelets 171; Potassium 3.3; Sodium 138  03/20/2018: Cholesterol 123; HDL 41; LDL Cholesterol 62; Total CHOL/HDL Ratio 3.0; Triglycerides 99; VLDL 20   CrCl cannot be calculated (Unknown ideal weight.).   Wt Readings from Last 3 Encounters:  09/17/18 167 lb 6.4 oz (75.9 kg)  09/14/18 167 lb 6.4  oz (75.9 kg)  08/19/18 180 lb (81.6 kg)     Other studies reviewed: Additional studies/records reviewed today include: summarized above  ASSESSMENT AND PLAN:  1. ***  Disposition: F/u with Dr. Mariah MillingGollan or an APP in ***  Current medicines are reviewed at length with the patient today.  The patient did not have any concerns regarding medicines.  Signed, Eula Listenyan , PA-C 10/01/2018 12:40 PM     [facility] [place] [address] [place], [place] [address] [phone]

## 2018-10-02 ENCOUNTER — Ambulatory Visit: Payer: Self-pay | Admitting: Physician Assistant

## 2018-10-08 ENCOUNTER — Encounter
Admission: RE | Admit: 2018-10-08 | Discharge: 2018-10-08 | Disposition: A | Discharge status: Home / Self Care | Payer: Self-pay | Source: Ambulatory Visit | Attending: Cardiovascular Disease | Admitting: Cardiovascular Disease

## 2018-10-08 DIAGNOSIS — R079 Chest pain, unspecified: Secondary | ICD-10-CM | POA: Insufficient documentation

## 2018-10-08 LAB — NM MYOCAR MULTI W/SPECT W/WALL MOTION / EF
CHL CUP NUCLEAR SDS: 0
CHL CUP NUCLEAR SSS: 0
LV dias vol: 112 mL (ref 62–150)
LV sys vol: 54 mL
NUC STRESS TID: 1.16
Peak HR: 93 {beats}/min
Percent HR: 54 %
Rest HR: 51 {beats}/min
SRS: 0

## 2018-10-08 MED ORDER — TECHNETIUM TC 99M TETROFOSMIN IV KIT
10.0000 | PACK | Freq: Once | INTRAVENOUS | Status: AC | PRN
  Administered 2018-10-08: 10.81 via INTRAVENOUS

## 2018-10-08 MED ORDER — TECHNETIUM TC 99M TETROFOSMIN IV KIT
32.7500 | PACK | Freq: Once | INTRAVENOUS | Status: AC | PRN
  Administered 2018-10-08: 32.75 via INTRAVENOUS

## 2018-10-08 MED ORDER — REGADENOSON 0.4 MG/5ML IV SOLN
0.4000 mg | Freq: Once | INTRAVENOUS | Status: AC
  Administered 2018-10-08: 0.4 mg via INTRAVENOUS

## 2018-10-08 NOTE — Progress Notes (Deleted)
Cardiology Office Note Date:  10/08/2018  Patient ID:  Bruce [name], DOB 1971-10-26, MRN [id number] PCP:  Patient, No Pcp Per  Cardiologist:  Dr. Mariah MillingGollan, MD  ***refresh   Chief Complaint: Follow up  History of Present Illness: Bruce [name] is a 47 y.o. male with history of recently [facility], polysubstance abuse with ongoing cocaine, alcohol, and tobacco abuse, and hepatitis C stemming from IV drug abuse who presents for follow up of his Afib and chest pain.   Patient was admitted to the hospital in 03/2018 sudden onset of palpitations in the setting of celebrating his birthday with cocaine, binge drinking, and tobacco use. He was found to be in new onset Afib with RVR with ventricular rates in the 130s bpm. There was associated chest pain, nausea, and vomiting. Echo showed an EF of 60-65%, no RWMA, Gr2DD, mild MR, LA normal in size, RVSF normal, PASP normal. He converted to sinus rhythm with rate control. He was not started on OAC at that time given CHADS2VASc of 0 and this occurred in the setting of the above. Since that admission, he has been seen in the ED 6 times with chest pain as well as being admitted in early 09/2018 for Afib with RVR. During each ED visit for chest pain he was noted to have a negative troponin except for his most recent ED visit on 11/11 as well as his admission on 11/8 for Afib with RVR, which he was noted to have a mildly elevated troponin that was flat trending at 0.03. His urine drug screen was positive for cocaine on every check except for 09/13/2018 admission for Afib with RVR. During patient's admission in early 09/2018, he reported regular episodes of palpitations on a weekly basis. He was noted to be in Afib with RVR. He was started on flecainide 50 mg bid as well as continued on Cardizem CD 180 mg daily. He converted to sinus rhythm prior to discharge. He was not placed on OAC.   Patient was seen in the ED on 09/17/2018 for recurrent chest  pain. Troponin 0.03. EKG showed sinus rhythm with no acute st/t changes. Urine drug screen positive for cocaine. He was discharged to outpatient follow up. [facility], which was completed on 10/08/2018 and showed ***.   ***  Past Medical History:  Diagnosis Date  . Dental caries   . Hepatitis C infection   . Iritis   . PAF (paroxysmal atrial fibrillation) (HCC)    a. diagnosed 03/19/18; b. CHADS2VASc 0  . Polysubstance abuse (HCC)    a. cocaine, tobacco, etoh  . Scrotal mass   . Tick borne fever   . Uveitis     Past Surgical History:  Procedure Laterality Date  . FINGER SURGERY     infection, middle left  . fractured jaw    . SHOULDER SURGERY     right    No outpatient medications have been marked as taking for the 10/10/18 encounter (Appointment) with [name],  M, PA-C.    Allergies:   Penicillins   Social History:  The patient  reports that he has been smoking cigarettes. He has been smoking about 0.25 packs per day. He has never used smokeless tobacco. He reports that he drinks alcohol. He reports that he has current or past drug history. Drugs: IV and Cocaine.   Family History:  The patient's family history includes Drug abuse in his father.  ROS:  ROS   PHYSICAL EXAM: *** VS:  There were no vitals taken for this visit. BMI: There is no height or weight on file to calculate BMI.  Physical Exam   EKG:  Was ordered and interpreted by me today. Shows ***  Recent Labs: 03/19/2018: Magnesium 2.0 05/28/2018: ALT 151 09/13/2018: TSH 1.002 09/17/2018: BUN 8; Creatinine, Ser 0.74; Hemoglobin 16.0; Platelets 171; Potassium 3.3; Sodium 138  03/20/2018: Cholesterol 123; HDL 41; LDL Cholesterol 62; Total CHOL/HDL Ratio 3.0; Triglycerides 99; VLDL 20   CrCl cannot be calculated (Patient's most recent lab result is older than the maximum 21 days allowed.).   Wt Readings from Last 3 Encounters:  09/17/18 167 lb 6.4 oz (75.9 kg)    09/14/18 167 lb 6.4 oz (75.9 kg)  08/19/18 180 lb (81.6 kg)     Other studies reviewed: Additional studies/records reviewed today include: summarized above  ASSESSMENT AND PLAN:  1. ***  Disposition: F/u with Dr. Mariah Milling or an APP in ***  Current medicines are reviewed at length with the patient today.  The patient did not have any concerns regarding medicines.  Signed, [name], PA-C 10/08/2018 10:53 AM     [facility] [place] [address] [place], [place] [address] [phone]

## 2018-10-10 ENCOUNTER — Ambulatory Visit: Payer: Self-pay | Admitting: Physician Assistant

## 2018-10-11 ENCOUNTER — Encounter: Payer: Self-pay | Admitting: Physician Assistant

## 2018-10-14 NOTE — Progress Notes (Addendum)
Cardiology Office Note Date:  10/16/2018  Patient ID:  Bruce [name] April 19, 1971, MRN [id number] PCP:  Patient, No Pcp Per  Cardiologist:  Dr. Mariah Milling, MD    Chief Complaint: Follow up   History of Present Illness: Bruce [name] is a 47 y.o. male with history of recently diagnosed Afib in 03/2018 not on [facility], polysubstance abuse with ongoing cocaine, alcohol, and tobacco abuse, and hepatitis C stemming from IV drug abuse who presents for follow up of his Afib and chest pain.   Patient was admitted to the hospital in 03/2018 sudden onset of palpitations in the setting of celebrating his birthday with cocaine, binge drinking, and tobacco use. He was found to be in new onset Afib with RVR with ventricular rates in the 130s bpm. There was associated chest pain, nausea, and vomiting. Echo showed an EF of 60-65%, no RWMA, Gr2DD, mild MR, LA normal in size, RVSF normal, PASP normal. He converted to sinus rhythm with rate control. He was not started on OAC at that time given CHADS2VASc of 0 and this occurred in the setting of the above. Since that admission, he has been seen in the ED 6 times with chest pain as well as being admitted in early 09/2018 for Afib with RVR. During each ED visit for chest pain he was noted to have a negative troponin except for his most recent ED visit on 11/11 as well as his admission on 11/8 for Afib with RVR, which he was noted to have a mildly elevated troponin that was flat trending at 0.03. His urine drug screen was positive for cocaine on every check except for 09/13/2018 admission for Afib with RVR. During patient's admission in early 09/2018, he reported regular episodes of palpitations on a weekly basis. He was noted to be in Afib with RVR. He was started on flecainide 50 mg bid as well as continued on Cardizem CD 180 mg daily. He converted to sinus rhythm prior to discharge. He was not placed on OAC.   Patient was seen in the ED on 09/17/2018 for recurrent chest pain.  Troponin 0.03. EKG showed sinus rhythm with no acute st/t changes. Urine drug screen positive for cocaine. He was discharged to outpatient follow up. He was scheduled for an outpatient [facility], which was completed on 10/08/2018 and was normal.   Patient comes in doing well today.  He continues to note intermittent tachypalpitations in which she will take an extra flecainide with resolution of symptoms.  He has also continued to note intermittent chest pain that is not associated with exertion or rest and typically improves with Tylenol.  He reports she last used tobacco, alcohol, and/or cocaine approximately 3 to 4 weeks ago.  He reports compliance with diltiazem and flecainide.  He does note some fatigue.   Past Medical History:  Diagnosis Date  . Dental caries   . Hepatitis C infection   . Iritis   . PAF (paroxysmal atrial fibrillation) (HCC)    a. diagnosed 03/19/18; b. CHADS2VASc 0  . Polysubstance abuse (HCC)    a. cocaine, tobacco, etoh  . Scrotal mass   . Tick borne fever   . Uveitis     Past Surgical History:  Procedure Laterality Date  . FINGER SURGERY     infection, middle left  . fractured jaw    . SHOULDER SURGERY     right    Current Meds  Medication Sig  . diazepam (VALIUM) 5 MG tablet Take 1 tablet (  5 mg total) by mouth every 8 (eight) hours as needed for muscle spasms.  Marland Kitchen diltiazem (CARDIZEM CD) 180 MG 24 hr capsule Take 1 capsule (180 mg total) by mouth daily.  . flecainide (TAMBOCOR) 50 MG tablet Take 1 tablet (50 mg total) by mouth every 12 (twelve) hours.    Allergies:   Penicillins   Social History:  The patient  reports that he has been smoking cigarettes. He has been smoking about 0.25 packs per day. He has never used smokeless tobacco. He reports that he drinks alcohol. He reports that he has current or past drug history. Drugs: IV and Cocaine.   Family History:  The patient's family history includes Drug abuse in his father.  ROS:   Review of  Systems  Constitutional: Positive for malaise/fatigue. Negative for chills, diaphoresis, fever and weight loss.  HENT: Negative for congestion.   Eyes: Negative for discharge and redness.  Respiratory: Negative for cough, hemoptysis, sputum production, shortness of breath and wheezing.   Cardiovascular: Positive for palpitations. Negative for chest pain, orthopnea, claudication, leg swelling and PND.  Gastrointestinal: Negative for abdominal pain, blood in stool, heartburn, melena, nausea and vomiting.  Genitourinary: Negative for hematuria.  Musculoskeletal: Negative for falls and myalgias.  Skin: Negative for rash.  Neurological: Negative for dizziness, tingling, tremors, sensory change, speech change, focal weakness, loss of consciousness and weakness.  Endo/Heme/Allergies: Does not bruise/bleed easily.  Psychiatric/Behavioral: Positive for substance abuse. The patient is not nervous/anxious.   All other systems reviewed and are negative.    PHYSICAL EXAM:  VS:  BP 118/72 (BP Location: Left Arm, Patient Position: Sitting, Cuff Size: Normal)   Pulse (!) 54   Ht 5\' 7"  (1.702 m)   Wt 178 lb (80.7 kg)   BMI 27.88 kg/m  BMI: Body mass index is 27.88 kg/m.  Physical Exam  Constitutional: He is oriented to person, place, and time. He appears well-developed and well-nourished.  HENT:  Head: Normocephalic and atraumatic.  Eyes: Right eye exhibits no discharge. Left eye exhibits no discharge.  Bilateral sclera are injected  Neck: Normal range of motion. No JVD present.  Cardiovascular: Normal rate, regular rhythm, S1 normal, S2 normal and normal heart sounds. Exam reveals no distant heart sounds, no friction rub, no midsystolic click and no opening snap.  No murmur heard. Pulmonary/Chest: Effort normal and breath sounds normal. No respiratory distress. He has no decreased breath sounds. He has no wheezes. He has no rales. He exhibits no tenderness.  Abdominal: Soft. He exhibits no  distension. There is no tenderness.  Musculoskeletal: He exhibits no edema.  Neurological: He is alert and oriented to person, place, and time.  Skin: Skin is warm and dry. No cyanosis. Nails show no clubbing.  Psychiatric: He has a normal mood and affect. His speech is normal and behavior is normal. Judgment and thought content normal.     EKG:  Was ordered and interpreted by me today. Shows sinus bradycardia, 54 bpm, no acute st/t changes  Recent Labs: 03/19/2018: Magnesium 2.0 05/28/2018: ALT 151 09/13/2018: TSH 1.002 09/17/2018: BUN 8; Creatinine, Ser 0.74; Hemoglobin 16.0; Platelets 171; Potassium 3.3; Sodium 138  03/20/2018: Cholesterol 123; HDL 41; LDL Cholesterol 62; Total CHOL/HDL Ratio 3.0; Triglycerides 99; VLDL 20   CrCl cannot be calculated (Patient's most recent lab result is older than the maximum 21 days allowed.).   Wt Readings from Last 3 Encounters:  10/16/18 178 lb (80.7 kg)  09/17/18 167 lb 6.4 oz (75.9 kg)  09/14/18  167 lb 6.4 oz (75.9 kg)     Other studies reviewed: Additional studies/records reviewed today include: summarized above  ASSESSMENT AND PLAN:  1. PAF: Currently in sinus rhythm with a bradycardic rate. Decrease Cardizem CD to 120 mg daily given bradycardia. Not currently on OAC given CHADS2VASc is 0.  Continue flecainide 50 mg twice daily.  Check outpatient cardiac monitoring to quantify A. fib burden given reported breakthrough palpitations.  For now, continue flecainide 50 mg twice daily.  I have discussed this medication with [facility] pharmacy and they do not find any absolute contraindication to flecainide use with cocaine abuse.  Nonetheless, patient reports he has not used cocaine for 3 to 4 weeks.  2. Chronic chest pain: Currently chest pain-free. Recent Lexiscan Myoview low risk.  Patient requests further evaluation.  Because of this, we will schedule the patient for a cardiac CT.  3. Polysubstance abuse: Complete cessation is advised.  Patient  indicates he has not drink, smoke, or use cocaine in 3 to 4 weeks.  Patient was advised any further cocaine abuse could be fatal.  He indicates he is done with cocaine.  4. Hypokalemia: Check cmet.   5. Transaminitis/hepatitis C: Check LFT. Refer to PCP for further evaluation and management.  Patient states he will be seen at the [facility] clinic.  Disposition: F/u with Dr. Mariah Milling or an APP in 3 months.  Current medicines are reviewed at length with the patient today.  The patient did not have any concerns regarding medicines.  Signed, [name], PA-C 10/16/2018 8:41 AM     [facility] [place] [address] [place], [place] [address] [phone]

## 2018-10-16 ENCOUNTER — Ambulatory Visit (INDEPENDENT_AMBULATORY_CARE_PROVIDER_SITE_OTHER): Payer: Self-pay | Admitting: Physician Assistant

## 2018-10-16 ENCOUNTER — Encounter: Payer: Self-pay | Admitting: Physician Assistant

## 2018-10-16 ENCOUNTER — Ambulatory Visit (INDEPENDENT_AMBULATORY_CARE_PROVIDER_SITE_OTHER): Payer: Self-pay

## 2018-10-16 VITALS — BP 118/72 | HR 54 | Ht 67.0 in | Wt 178.0 lb

## 2018-10-16 DIAGNOSIS — R002 Palpitations: Secondary | ICD-10-CM

## 2018-10-16 DIAGNOSIS — I480 Paroxysmal atrial fibrillation: Secondary | ICD-10-CM

## 2018-10-16 DIAGNOSIS — E876 Hypokalemia: Secondary | ICD-10-CM

## 2018-10-16 DIAGNOSIS — R079 Chest pain, unspecified: Secondary | ICD-10-CM

## 2018-10-16 DIAGNOSIS — R7401 Elevation of levels of liver transaminase levels: Secondary | ICD-10-CM

## 2018-10-16 DIAGNOSIS — Z79899 Other long term (current) drug therapy: Secondary | ICD-10-CM

## 2018-10-16 DIAGNOSIS — R740 Nonspecific elevation of levels of transaminase and lactic acid dehydrogenase [LDH]: Secondary | ICD-10-CM

## 2018-10-16 DIAGNOSIS — F1910 Other psychoactive substance abuse, uncomplicated: Secondary | ICD-10-CM

## 2018-10-16 MED ORDER — DILTIAZEM HCL ER COATED BEADS 120 MG PO CP24: 120.0000 mg | ORAL_CAPSULE | Freq: Every day | ORAL | 2 refills | Status: DC

## 2018-10-16 MED ORDER — FLECAINIDE ACETATE 50 MG PO TABS: 50.0000 mg | ORAL_TABLET | Freq: Two times a day (BID) | ORAL | 2 refills | Status: DC

## 2018-10-16 NOTE — Patient Instructions (Addendum)
Medication Instructions:  Your physician has recommended you make the following change in your medication:  1- DECREASE Cardizem to 120 mg by mouth once a day. 2- CONTINUE Flecainide 50 mg by mouth two times a day.  If you need a refill on your cardiac medications before your next appointment, please call your pharmacy.   Lab work: Your physician recommends that you return for lab work in: TODAY- CMET.  If you have labs (blood work) drawn today and your tests are completely normal, you will receive your results only by: Marland Kitchen MyChart Message (if you have MyChart) OR . A paper copy in the mail If you have any lab test that is abnormal or we need to change your treatment, we will call you to review the results.  Testing/Procedures: Your physician has recommended that you wear an 14 DAY ZIO event monitor. Event monitors are medical devices that record the heart's electrical activity. Doctors most often [place] these monitors to diagnose arrhythmias. Arrhythmias are problems with the speed or rhythm of the heartbeat. The monitor is a small, portable device. You can wear one while you do your normal daily activities. This is usually used to diagnose what is causing palpitations/syncope (passing out). A Zio Patch Event Heart monitor will be applied to your chest today.  You will wear the patch for 14 days. After 24 hours, you may shower with the heart monitor on. If you feel any PALPITATIONS, you may press and release the button in the middle of the monitor.   Your physician has requested that you have cardiac CT. Cardiac computed tomography (CT) is a painless test that uses an x-ray machine to take clear, detailed pictures of your heart. For further information please visit [email or web address] Please follow instruction sheet as given.   Please arrive at the [facility] main entrance of [facility] at xx:xx AM (30-45 minutes prior to test start time)  [facility] [address] [place], [place] [address] [phone]  Proceed to the [facility] Radiology Department (First Floor).  Please follow these instructions carefully (unless otherwise directed):  Hold all erectile dysfunction medications at least 48 hours prior to test.  On the Night Before the Test: . Be sure to Drink plenty of water. . Do not consume any caffeinated/decaffeinated beverages or chocolate 12 hours prior to your test. . Do not take any antihistamines 12 hours prior to your test.   On the Day of the Test: . Drink plenty of water. Do not drink any water within one hour of the test. . Do not eat any food 4 hours prior to the test. . You may take your regular medications prior to the test.     After the Test: . Drink plenty of water. . After receiving IV contrast, you may experience a mild flushed feeling. This is normal. . On occasion, you may experience a mild rash up to 24 hours after the test. This is not dangerous. If this occurs, you can take Benadryl 25 mg and increase your fluid intake. . If you experience trouble breathing, this can be serious. If it is severe call 911 IMMEDIATELY. If it is mild, please call our office. . If you take any of these medications: Glipizide/Metformin, Avandament, Glucavance, please do not take 48 hours after completing test.     Follow-Up: At [facility], you and your health needs are our priority.  As part of our continuing mission to provide you with exceptional heart care, we  have created designated Provider Care Teams.  These Care Teams include your primary Cardiologist (physician) and Advanced Practice Providers (APPs -  Physician Assistants and Nurse Practitioners) who all work together to provide you with the care you need, when you need it. You will need a follow up appointment in 3 months.  Please call our office 2 months in advance to schedule this appointment.  You may see Dr Julien NordmannIMOTHY GOLLAN or one of the following Advanced Practice  Providers on your designated Care Team:   Nicolasa Duckinghristopher Berge, NP Eula Listenyan Dunn, PA-C . Marisue IvanJacquelyn Visser, PA-C

## 2018-10-17 ENCOUNTER — Telehealth: Payer: Self-pay | Admitting: *Deleted

## 2018-10-17 LAB — COMPREHENSIVE METABOLIC PANEL
A/G RATIO: 1.3 (ref 1.2–2.2)
ALK PHOS: 83 IU/L (ref 39–117)
ALT: 56 IU/L — AB (ref 0–44)
AST: 60 IU/L — ABNORMAL HIGH (ref 0–40)
Albumin: 4 g/dL (ref 3.5–5.5)
BUN/Creatinine Ratio: 7 — ABNORMAL LOW (ref 9–20)
BUN: 6 mg/dL (ref 6–24)
Bilirubin Total: 0.5 mg/dL (ref 0.0–1.2)
CALCIUM: 9.3 mg/dL (ref 8.7–10.2)
CO2: 21 mmol/L (ref 20–29)
CREATININE: 0.85 mg/dL (ref 0.76–1.27)
Chloride: 100 mmol/L (ref 96–106)
GFR calc non Af Amer: 102 mL/min/{1.73_m2} (ref >59)
GFR, EST AFRICAN AMERICAN: 118 mL/min/{1.73_m2} (ref >59)
GLUCOSE: 97 mg/dL (ref 65–99)
Globulin, Total: 3.1 g/dL (ref 1.5–4.5)
Potassium: 3.8 mmol/L (ref 3.5–5.2)
Sodium: 138 mmol/L (ref 134–144)
Total Protein: 7.1 g/dL (ref 6.0–8.5)

## 2018-10-17 NOTE — Telephone Encounter (Signed)
-----   Message from Antonieta Ibaimothy J Gollan, MD sent at 10/10/2018  4:50 PM EST ----- Stress test Normal study, no ischemia, normal ejection fraction

## 2018-10-17 NOTE — Telephone Encounter (Signed)
Other number not in service. Left voicemail message on home number for patient to call back for results.

## 2018-10-17 NOTE — Telephone Encounter (Signed)
Left voicemail message for patient to call back for results.  

## 2018-10-17 NOTE — Telephone Encounter (Signed)
-----   Message from Sondra Bargesyan M Dunn, PA-C sent at 10/17/2018  7:37 AM EST ----- Glucose is normal.  Renal function is normal.  Potassium is normal, though slightly below goal of 4.0.  Liver function remains abnormal, though improved.   Recommend he continue to eat foods high in potassium.  Follow up with PCP for elevated liver function testing and history of hepatitis C.

## 2018-10-19 NOTE — Telephone Encounter (Signed)
Patient returning call to discuss labs results

## 2018-10-19 NOTE — Telephone Encounter (Signed)
Spoke with patient and reviewed his stress test results along with his lab results. He verbalized understanding of all information with no further questions at this time.

## 2018-10-28 ENCOUNTER — Emergency Department: Payer: Self-pay

## 2018-10-28 ENCOUNTER — Emergency Department
Admission: EM | Admit: 2018-10-28 | Discharge: 2018-10-28 | Disposition: A | Discharge status: Home / Self Care | Payer: Self-pay | Attending: Emergency Medicine | Admitting: Emergency Medicine

## 2018-10-28 ENCOUNTER — Encounter: Payer: Self-pay | Admitting: Emergency Medicine

## 2018-10-28 ENCOUNTER — Other Ambulatory Visit: Payer: Self-pay

## 2018-10-28 DIAGNOSIS — F17210 Nicotine dependence, cigarettes, uncomplicated: Secondary | ICD-10-CM | POA: Insufficient documentation

## 2018-10-28 DIAGNOSIS — Z79899 Other long term (current) drug therapy: Secondary | ICD-10-CM | POA: Insufficient documentation

## 2018-10-28 DIAGNOSIS — R079 Chest pain, unspecified: Secondary | ICD-10-CM

## 2018-10-28 LAB — CBC
HEMATOCRIT: 48.3 % (ref 39.0–52.0)
Hemoglobin: 17 g/dL (ref 13.0–17.0)
MCH: 30.5 pg (ref 26.0–34.0)
MCHC: 35.2 g/dL (ref 30.0–36.0)
MCV: 86.7 fL (ref 80.0–100.0)
PLATELETS: 273 10*3/uL (ref 150–400)
RBC: 5.57 MIL/uL (ref 4.22–5.81)
RDW: 12.2 % (ref 11.5–15.5)
WBC: 5.1 10*3/uL (ref 4.0–10.5)
nRBC: 0 % (ref 0.0–0.2)

## 2018-10-28 LAB — COMPREHENSIVE METABOLIC PANEL
ALT: 75 U/L — ABNORMAL HIGH (ref 0–44)
AST: 81 U/L — ABNORMAL HIGH (ref 15–41)
Albumin: 4.3 g/dL (ref 3.5–5.0)
Alkaline Phosphatase: 81 U/L (ref 38–126)
Anion gap: 12 (ref 5–15)
BILIRUBIN TOTAL: 0.8 mg/dL (ref 0.3–1.2)
BUN: 7 mg/dL (ref 6–20)
CO2: 25 mmol/L (ref 22–32)
Calcium: 9.4 mg/dL (ref 8.9–10.3)
Chloride: 101 mmol/L (ref 98–111)
Creatinine, Ser: 0.75 mg/dL (ref 0.61–1.24)
GFR calc Af Amer: >60 mL/min (ref >60)
GFR calc non Af Amer: >60 mL/min (ref >60)
Glucose, Bld: 132 mg/dL — ABNORMAL HIGH (ref 70–99)
Potassium: 3.6 mmol/L (ref 3.5–5.1)
Sodium: 138 mmol/L (ref 135–145)
Total Protein: 8.4 g/dL — ABNORMAL HIGH (ref 6.5–8.1)

## 2018-10-28 LAB — TROPONIN I
Troponin I: <0.03 ng/mL (ref <0.03)
Troponin I: <0.03 ng/mL (ref <0.03)

## 2018-10-28 LAB — ETHANOL: Alcohol, Ethyl (B): 19 mg/dL — ABNORMAL HIGH (ref <10)

## 2018-10-28 MED ORDER — MORPHINE SULFATE (PF) 4 MG/ML IV SOLN
4.0000 mg | Freq: Once | INTRAVENOUS | Status: AC
  Administered 2018-10-28: 4 mg via INTRAVENOUS
  Filled 2018-10-28: qty 1

## 2018-10-28 MED ORDER — SODIUM CHLORIDE 0.9 % IV BOLUS
1000.0000 mL | Freq: Once | INTRAVENOUS | Status: AC
  Administered 2018-10-28: 1000 mL via INTRAVENOUS

## 2018-10-28 MED ORDER — NITROGLYCERIN 0.4 MG SL SUBL
0.4000 mg | SUBLINGUAL_TABLET | SUBLINGUAL | Status: DC | PRN
  Administered 2018-10-28: 0.4 mg via SUBLINGUAL
  Filled 2018-10-28: qty 1

## 2018-10-28 MED ORDER — ONDANSETRON HCL 4 MG/2ML IJ SOLN
4.0000 mg | Freq: Once | INTRAMUSCULAR | Status: AC
  Administered 2018-10-28: 4 mg via INTRAVENOUS
  Filled 2018-10-28: qty 2

## 2018-10-28 NOTE — ED Triage Notes (Signed)
States chest pain began one hour ago while resting, states is L sided. Patient arrives hyperventilating. Able to slow breathing during EKG.

## 2018-10-28 NOTE — Discharge Instructions (Addendum)
These call the number provided for cardiology to inform them of today's ER visit and to arrange a follow-up appointment as soon as possible.  Return to the emergency department for any return of chest pain, trouble breathing, or any other symptom personally concerning to yourself.

## 2018-10-28 NOTE — ED Provider Notes (Signed)
[facility] Emergency Department Provider Note  Time seen: 4:53 PM  I have reviewed the triage vital signs and the nursing notes.   HISTORY  Chief Complaint Chest Pain    HPI Bruce [name] is a [age] y.o. male with a past medical history of hepatitis, paroxysmal atrial fibrillation, polysubstance abuse, presents to the emergency department for chest pain.  According to the patient since around 1 PM today he has been experiencing chest pain.  Describes as an 8/10 along with nausea.  Patient states he is nauseated and then spits in a emesis bag but is not vomiting.  Denies any diaphoresis or shortness of breath.  Describes the pain as a dull aching pain in the center of his chest 8/10 in severity currently.   Past Medical History:  Diagnosis Date  . Dental caries   . Hepatitis C infection   . Iritis   . PAF (paroxysmal atrial fibrillation) (HCC)    a. diagnosed 03/19/18; b. CHADS2VASc 0  . Polysubstance abuse (HCC)    a. cocaine, tobacco, etoh  . Scrotal mass   . Tick borne fever   . Uveitis     Patient Active Problem List   Diagnosis Date Noted  . A-fib (HCC) 09/13/2018  . Atrial fibrillation (HCC) 03/19/2018  . Abnormality of gait 02/12/2013  . Mandibular fracture (HCC) 12/25/2012  . Flu vaccine need 12/25/2012  . Scrotal mass 05/22/2012  . Dental caries 05/22/2012  . Hepatitis C infection 05/16/2012  . Myalgia 05/06/2012  . Arthralgia 05/06/2012  . Tick borne fever, likely 05/05/2012  . Tinea cruris, likely 05/05/2012  . Spermatocele of epididymis 05/01/2012  . Bilateral varicoceles 05/01/2012  . Bilateral hydrocele 05/01/2012    Past Surgical History:  Procedure Laterality Date  . FINGER SURGERY     infection, middle left  . fractured jaw    . SHOULDER SURGERY     right    Prior to Admission medications   Medication Sig Start Date End Date Taking? Authorizing Provider  diazepam (VALIUM) 5 MG tablet Take 1 tablet (5 mg total) by mouth  every 8 (eight) hours as needed for muscle spasms. 09/17/18   Emily FilbertWilliams, Jonathan E, MD  diltiazem (CARDIZEM CD) 120 MG 24 hr capsule Take 1 capsule (120 mg total) by mouth daily. 10/16/18   [name], Ryan M, PA-C  flecainide (TAMBOCOR) 50 MG tablet Take 1 tablet (50 mg total) by mouth 2 (two) times daily. 10/16/18 01/14/19  [name], Ryan M, PA-C    Allergies  Allergen Reactions  . Penicillins Other (See Comments)    Childhood reaction    Family History  Problem Relation Age of Onset  . Drug abuse Father     Social History Social History   Tobacco Use  . Smoking status: Current Some Day Smoker    Packs/day: 0.25    Types: Cigarettes  . Smokeless tobacco: Never Used  . Tobacco comment: Per pt  " I only smoke cigarettes when I drink beer"  Substance Use Topics  . Alcohol use: Yes    Comment: 6 pack per week  . Drug use: Not Currently    Types: IV, Cocaine    Review of Systems Constitutional: Negative for fever. Cardiovascular: Positive for central chest pain Respiratory: Negative for shortness of breath. Gastrointestinal: Negative for abdominal pain positive nausea.  Negative for vomiting Genitourinary: Negative for urinary compaints Musculoskeletal: Negative for leg pain or swelling. Skin: Negative for skin complaints  Neurological: Negative for headache All other ROS  negative  ____________________________________________   PHYSICAL EXAM:  VITAL SIGNS: ED Triage Vitals  Enc Vitals Group     BP 10/28/18 1357 136/89     Pulse Rate 10/28/18 1357 88     Resp 10/28/18 1357 18     Temp 10/28/18 1357 98.6 F (37 C)     Temp Source 10/28/18 1357 Oral     SpO2 10/28/18 1357 97 %     Weight 10/28/18 1358 168 lb (76.2 kg)     Height 10/28/18 1358 5\' 7"  (1.702 m)     Head Circumference --      Peak Flow --      Pain Score 10/28/18 1358 9     Pain Loc --      Pain Edu? --      Excl. in GC? --    Constitutional: Alert and oriented. Well appearing and in no distress.  Lying  in bed watching football. Eyes: Normal exam ENT   Head: Normocephalic and atraumatic.   Mouth/Throat: Mucous membranes are moist. Cardiovascular: Normal rate, regular rhythm. No murmur Respiratory: Normal respiratory effort without tachypnea nor retractions. Breath sounds are clear  Gastrointestinal: Soft and nontender. No distention.   Musculoskeletal: Nontender with normal range of motion in all extremities. No lower extremity tenderness or edema. Neurologic:  Normal speech and language. No gross focal neurologic deficits  Skin:  Skin is warm, dry and intact.  Psychiatric: Mood and affect are normal.   ____________________________________________    EKG  EKG viewed and interpreted by myself shows a normal sinus rhythm at 79 bpm with a narrow QRS, normal axis, normal intervals, no concerning ST changes.  ____________________________________________    RADIOLOGY  Chest x-ray is negative  ____________________________________________   INITIAL IMPRESSION / ASSESSMENT AND PLAN / ED COURSE  Pertinent labs & imaging results that were available during my care of the patient were reviewed by me and considered in my medical decision making (see chart for details).  Patient presents to the emergency department for onset of chest pain around 1 PM today.  Currently states 8/10 chest pain in the middle of his chest.  States nausea but denies any vomiting.  Differential would include ACS, chest wall pain, angina, reflux, pneumonia, pneumothorax.  We will check labs, chest x-ray, treat pain and continue to closely monitor. Patient's initial lab work is negative/reassuring.  Troponin is negative.  We will obtain a repeat troponin and continue to closely watch.  Pain improved after pain medication.  Patient's repeat troponin remains negative.  Overall the patient appears well with a reassuring emergency department work-up.  I discussed with the patient if the chest pain returns or  shortness of breath occurs he is to return to the emergency department otherwise he will call his cardiologist tomorrow.  Patient agreeable to this plan of care.  ____________________________________________   FINAL CLINICAL IMPRESSION(S) / ED DIAGNOSES  Chest pain   Minna AntisPaduchowski, Quirino Kakos, MD 10/28/18 1818

## 2018-10-28 NOTE — ED Notes (Signed)
PT escorted to room, pt changed self into gown, turned on TV and selected channel to view football game.  PT states pain has gotten worse since he has been waiting.  PT noted to have mild cough, states this started after pain started.

## 2018-11-22 ENCOUNTER — Telehealth: Payer: Self-pay | Admitting: Physician Assistant

## 2018-11-22 MED ORDER — DILTIAZEM HCL ER COATED BEADS 180 MG PO CP24: 180.0000 mg | ORAL_CAPSULE | Freq: Every day | ORAL | 3 refills | Status: DC

## 2018-11-22 NOTE — Telephone Encounter (Signed)
The patient has been notified of his monitor results. He is aware of Alycia Rossetti, PA's recommendations to increase diltiazem to 180 mg once daily. The patient has also been advised his monitor report has been sent to Dr. Mariah Milling for further review regarding flecainide. He is aware we will call him back with any further instructions regarding flecainide, but otherwise keep his follow up appointment scheduled with Dr. Mariah Milling on 01/15/19.  The patient voices understanding of the above and is agreeable. He would like his updated dilitazem prescription sent to [facility] on [address]

## 2018-11-22 NOTE — Telephone Encounter (Signed)
Notes recorded by Sondra Barges, PA-C on 11/20/2018 at 5:21 PM EST Heart monitor showed NSR with an average heart rate of 85 bpm.  He was noted to have 25 runs of SVT with the fastest interval lasting 5 beats with a max rate of 207 bpm, and the longest run lasting 14 beats with an average rate of 156 bpm.  Isolated PACs with occasional (1.1% burden), atrial couplets and triplets ere rare.  Isolated PVCs and ventricular couplets were rare.  Ventricular bigeminy was noted.  Patient triggered events did not correspond with significant arrhythmia.   Given his average heart rate was noted to be 85 bpm, increase Cardizem CD to 180 mg daily given the above. With his history of cocaine abuse, I would like to avoid beta blockers, though if needed, we could possibly use Coreg. Ideally, I would like to get him off Flecainide. In that setting, I will also route this result to his primary cardiologist for his review as well. Keep planned follow up with Dr. Mariah Milling.

## 2019-01-15 ENCOUNTER — Ambulatory Visit: Payer: Self-pay | Admitting: Cardiovascular Disease

## 2019-01-20 ENCOUNTER — Emergency Department: Payer: Self-pay

## 2019-01-20 ENCOUNTER — Other Ambulatory Visit: Payer: Self-pay

## 2019-01-20 ENCOUNTER — Emergency Department
Admission: EM | Admit: 2019-01-20 | Discharge: 2019-01-21 | Disposition: A | Discharge status: Home / Self Care | Payer: Self-pay | Attending: Emergency Medicine | Admitting: Emergency Medicine

## 2019-01-20 DIAGNOSIS — F419 Anxiety disorder, unspecified: Secondary | ICD-10-CM | POA: Insufficient documentation

## 2019-01-20 DIAGNOSIS — Z79899 Other long term (current) drug therapy: Secondary | ICD-10-CM | POA: Insufficient documentation

## 2019-01-20 DIAGNOSIS — F17210 Nicotine dependence, cigarettes, uncomplicated: Secondary | ICD-10-CM | POA: Insufficient documentation

## 2019-01-20 DIAGNOSIS — R0789 Other chest pain: Secondary | ICD-10-CM

## 2019-01-20 DIAGNOSIS — R079 Chest pain, unspecified: Secondary | ICD-10-CM | POA: Insufficient documentation

## 2019-01-20 LAB — COMPREHENSIVE METABOLIC PANEL
ALT: 58 U/L — AB (ref 0–44)
AST: 68 U/L — ABNORMAL HIGH (ref 15–41)
Albumin: 4.4 g/dL (ref 3.5–5.0)
Alkaline Phosphatase: 79 U/L (ref 38–126)
Anion gap: 11 (ref 5–15)
BUN: <5 mg/dL — ABNORMAL LOW (ref 6–20)
CO2: 25 mmol/L (ref 22–32)
Calcium: 9.1 mg/dL (ref 8.9–10.3)
Chloride: 105 mmol/L (ref 98–111)
Creatinine, Ser: 0.67 mg/dL (ref 0.61–1.24)
GFR calc Af Amer: >60 mL/min (ref >60)
GFR calc non Af Amer: >60 mL/min (ref >60)
GLUCOSE: 111 mg/dL — AB (ref 70–99)
Potassium: 3.3 mmol/L — ABNORMAL LOW (ref 3.5–5.1)
Sodium: 141 mmol/L (ref 135–145)
Total Bilirubin: 0.5 mg/dL (ref 0.3–1.2)
Total Protein: 8.2 g/dL — ABNORMAL HIGH (ref 6.5–8.1)

## 2019-01-20 LAB — ETHANOL: Alcohol, Ethyl (B): <10 mg/dL (ref <10)

## 2019-01-20 LAB — CBC
HCT: 47.1 % (ref 39.0–52.0)
Hemoglobin: 16.5 g/dL (ref 13.0–17.0)
MCH: 31 pg (ref 26.0–34.0)
MCHC: 35 g/dL (ref 30.0–36.0)
MCV: 88.4 fL (ref 80.0–100.0)
Platelets: 200 10*3/uL (ref 150–400)
RBC: 5.33 MIL/uL (ref 4.22–5.81)
RDW: 13 % (ref 11.5–15.5)
WBC: 4.2 10*3/uL (ref 4.0–10.5)
nRBC: 0 % (ref 0.0–0.2)

## 2019-01-20 LAB — TROPONIN I
Troponin I: 0.03 ng/mL (ref <0.03)
Troponin I: <0.03 ng/mL (ref <0.03)

## 2019-01-20 MED ORDER — LORAZEPAM 2 MG/ML IJ SOLN
1.0000 mg | Freq: Once | INTRAMUSCULAR | Status: AC
  Administered 2019-01-20: 1 mg via INTRAVENOUS
  Filled 2019-01-20: qty 1

## 2019-01-20 MED ORDER — ASPIRIN 81 MG PO CHEW
324.0000 mg | CHEWABLE_TABLET | Freq: Once | ORAL | Status: AC
  Administered 2019-01-20: 324 mg via ORAL
  Filled 2019-01-20: qty 4

## 2019-01-20 NOTE — ED Provider Notes (Signed)
[facility] Emergency Department Provider Note  ____________________________________________   I have reviewed the triage vital signs and the nursing notes. Where available I have reviewed prior notes and, if possible and indicated, outside hospital notes.    HISTORY  Chief Complaint Chest Pain    HPI Bruce [name] is a 48 y.o. male presents today complaining of feeling anxious about his chest.  He states that his heart was racing earlier and it made him feel very anxious.  Is not actually having chest pain.  This is something that is happened to him many times before.  He has seen a cardiologist for it he has had a negative cardiology work-up several times for this he has been to the emergency room about 10 times for this in the last year or so.  He states this is exact same symptom he always has.  He has a sensation of anxiety about his chest pain.  He is try to go on disability for his chest pain.  He does not actually have any stents or any known coronary artery disease although sometimes he does have A. fib with RVR it is reported and he states that he thinks he was in that earlier today and that got his anxiety about this started.  He does not have a history of panic attacks that he knows of.  He denies any leg swelling recent travel or history of PE or DVT.  No family history of PE or DVT no recent surgery no exogenous estrogens obviously.  Patient states that he has been feeling uncomfortable in his chest since this morning nonstop with nothing making the pain better or worse no exertional symptoms and no other alleviating or aggravating symptoms.  He has had no antecedent intervention except for when his first started having a heart race this morning he took his medication that he supposed to take for that and it seemed as if it stopped his heart from racing but after that he states he was quite anxious about it. He states that he does have some discomfort in his chest  today which is nonradiating, and in the left chest where it always is all the time he states.  He denies using cocaine recently.  He does have a history of cocaine abuse.   Past Medical History:  Diagnosis Date  . Dental caries   . Hepatitis C infection   . Iritis   . PAF (paroxysmal atrial fibrillation) (HCC)    a. diagnosed 03/19/18; b. CHADS2VASc 0  . Polysubstance abuse (HCC)    a. cocaine, tobacco, etoh  . Scrotal mass   . Tick borne fever   . Uveitis     Patient Active Problem List   Diagnosis Date Noted  . A-fib (HCC) 09/13/2018  . Atrial fibrillation (HCC) 03/19/2018  . Abnormality of gait 02/12/2013  . Mandibular fracture (HCC) 12/25/2012  . Flu vaccine need 12/25/2012  . Scrotal mass 05/22/2012  . Dental caries 05/22/2012  . Hepatitis C infection 05/16/2012  . Myalgia 05/06/2012  . Arthralgia 05/06/2012  . Tick borne fever, likely 05/05/2012  . Tinea cruris, likely 05/05/2012  . Spermatocele of epididymis 05/01/2012  . Bilateral varicoceles 05/01/2012  . Bilateral hydrocele 05/01/2012    Past Surgical History:  Procedure Laterality Date  . FINGER SURGERY     infection, middle left  . fractured jaw    . SHOULDER SURGERY     right    Prior to Admission medications   Medication Sig  Start Date End Date Taking? Authorizing Provider  diltiazem (CARDIZEM CD) 180 MG 24 hr capsule Take 1 capsule (180 mg total) by mouth daily. 11/22/18 02/20/19 Yes Dunn, Raymon Mutton, PA-C  flecainide (TAMBOCOR) 50 MG tablet Take 1 tablet (50 mg total) by mouth 2 (two) times daily. 10/16/18 01/20/19 Yes Dunn, Raymon Mutton, PA-C    Allergies Penicillins  Family History  Problem Relation Age of Onset  . Drug abuse Father     Social History Social History   Tobacco Use  . Smoking status: Current Some Day Smoker    Packs/day: 0.25    Types: Cigarettes  . Smokeless tobacco: Never Used  . Tobacco comment: Per pt  " I only smoke cigarettes when I drink beer"  Substance Use Topics  .  Alcohol use: Yes    Comment: 6 pack per week  . Drug use: Not Currently    Types: IV, Cocaine    Review of Systems Constitutional: No fever/chills Eyes: No visual changes. ENT: No sore throat. No stiff neck no neck pain Cardiovascular: See HPI Respiratory: Denies shortness of breath. Gastrointestinal:   no vomiting.  No diarrhea.  No constipation. Genitourinary: Negative for dysuria. Musculoskeletal: Negative lower extremity swelling Skin: Negative for rash. Neurological: Negative for severe headaches, focal weakness or numbness.   ____________________________________________   PHYSICAL EXAM:  VITAL SIGNS: ED Triage Vitals  Enc Vitals Group     BP 01/20/19 1903 (!) 148/79     Pulse Rate 01/20/19 1902 62     Resp 01/20/19 1902 16     Temp 01/20/19 1903 98 F (36.7 C)     Temp Source 01/20/19 1902 Oral     SpO2 01/20/19 1903 99 %     Weight 01/20/19 1901 170 lb (77.1 kg)     Height 01/20/19 1901  (1.702 m)     Head Circumference --      Peak Flow --      Pain Score 01/20/19 1901 8     Pain Loc --      Pain Edu? --      Excl. in GC? --     Constitutional: Alert and oriented. Well appearing and in no acute distress.  Right anxious but no acute distress Eyes: Conjunctivae are normal Head: Atraumatic HEENT: No congestion/rhinnorhea. Mucous membranes are moist.  Oropharynx non-erythematous Neck:   Nontender with no meningismus, no masses, no stridor Cardiovascular: Normal rate, regular rhythm. Grossly normal heart sounds.  Good peripheral circulation. Respiratory: Normal respiratory effort.  No retractions. Lungs CTAB. : There is some element of reproducibility to the chest pain when I touch his left chest wall he states outside the pain right there and pulls back however no crepitus no flail chest etc. Abdominal: Soft and nontender. No distention. No guarding no rebound Back:  There is no focal tenderness or step off.  there is no midline tenderness there are no  lesions noted. there is no CVA tenderness  Musculoskeletal: No lower extremity tenderness, no upper extremity tenderness. No joint effusions, no DVT signs strong distal pulses no edema Neurologic:  Normal speech and language. No gross focal neurologic deficits are appreciated.  Skin:  Skin is warm, dry and intact. No rash noted. Psychiatric: Mood and affect are normal. Speech and behavior are anxious.  ____________________________________________   LABS (all labs ordered are listed, but only abnormal results are displayed)  Labs Reviewed  TROPONIN I - Abnormal; Notable for the following components:      Result  Value   Troponin I 0.03 (*)    All other components within normal limits  COMPREHENSIVE METABOLIC PANEL - Abnormal; Notable for the following components:   Potassium 3.3 (*)    Glucose, Bld 111 (*)    BUN <5 (*)    Total Protein 8.2 (*)    AST 68 (*)    ALT 58 (*)    All other components within normal limits  CBC  ETHANOL  TROPONIN I  URINALYSIS, COMPLETE (UACMP) WITH MICROSCOPIC  URINE DRUG SCREEN, QUALITATIVE ([facility] ONLY)    Pertinent labs  results that were available during my care of the patient were reviewed by me and considered in my medical decision making (see chart for details). ____________________________________________  EKG  I personally interpreted any EKGs ordered by me or triage EKG shows sinus rhythm with no ST elevation or depression normal axis unremarkable EKG ____________________________________________  RADIOLOGY  Pertinent labs & imaging results that were available during my care of the patient were reviewed by me and considered in my medical decision making (see chart for details). If possible, patient and/or family made aware of any abnormal findings.  Dg Chest 2 View  Result Date: 01/20/2019 CLINICAL DATA:  Chest pain and shortness of breath EXAM: CHEST - 2 VIEW COMPARISON:  October 28, 2018 FINDINGS: The lungs are clear. Heart size and  pulmonary vascularity are normal. No adenopathy. No bone lesions. No pneumothorax. IMPRESSION: No edema or consolidation. Electronically Signed   By: Bretta Bang III M.D.   On: 01/20/2019 19:33   ____________________________________________    PROCEDURES  Procedure(s) performed: None  Procedures  Critical Care performed: None  ____________________________________________   INITIAL IMPRESSION / ASSESSMENT AND PLAN / ED COURSE  Pertinent labs & imaging results that were available during my care of the patient were reviewed by me and considered in my medical decision making (see chart for details).  Patient here with chronic recurrent chest discomfort for which she is trying to get on disability, his cardiac enzymes are negative his EKG is reassuring I doubt that he has a PE.  He is not in the dysrhythmia.  I did give him Ativan as he was very anxious by his own account he took a nap and he states that when he woke up he felt better than he has in a "very long time" and he would like to go home.  I did offer admission to him and his wife in the room.  I did explain to the limitations of an ER work-up for cardiac symptoms, and he declines admission.  He is very concerned as we are in a pandemic situation he does not want a be exposed to illness.  Given that he refuses admission he is 100% better by his own account after Ativan he has chronic recurrent pain he has been here for this very frequently and he has 2 sets of reassuring cardiac enzymes, we will course discharge him.  He understands I think the repercussions of this decision and does not want to stay.  His wife is with him in the room.  At this time, there does not appear to be clinical evidence to support the diagnosis of pulmonary embolus, dissection, myocarditis, endocarditis, pericarditis, pericardial tamponade, acute coronary syndrome, pneumothorax, pneumonia, or any other acute intrathoracic pathology that will require admission  or acute intervention. Nor is there evidence of any significant intra-abdominal pathology causing this discomfort.    ____________________________________________   FINAL CLINICAL IMPRESSION(S) / ED DIAGNOSES  Final  diagnoses:  None      This chart was dictated using voice recognition software.  Despite best efforts to proofread,  errors can occur which can change meaning.      Jeanmarie Plant, MD 01/20/19 [phone]

## 2019-01-20 NOTE — ED Notes (Signed)
Pt also states that he tried his home medications that he is to take when his chest starts hurting and nothing helped. He states that one med is to slow his HR when it gets fast and the other is to help relieve the pressure in his chest.

## 2019-01-20 NOTE — ED Notes (Signed)
Troponin 0.03

## 2019-01-20 NOTE — Discharge Instructions (Addendum)
Continue taking her medications as prescribed, return to the emergency room if you have racing heart, chest pain, you change your mind about being admitted to the hospital you have any other new or worrisome symptoms.  Otherwise, do not feel to follow-up with your primary care doctor and your cardiologist, and if you cannot get see your cardiologist, follow-up with one listed above.

## 2019-01-20 NOTE — ED Notes (Signed)
Pt was resting well. Pt reports feeling much better without chest pain at this time.

## 2019-01-20 NOTE — ED Triage Notes (Signed)
FIRST NURSE NOTE-pt here for chest pain. Pulled next for EKG. Ambulatory. Hx afib

## 2019-01-20 NOTE — ED Triage Notes (Signed)
Pt states left sided chest pain and shob for 2 hours. Pt states has a fib but is not sure if he is on a blood thinner. Pt appears uncomfortable.pt states does feel lightheaded. Able to speak in full rapid sentences.

## 2019-03-08 ENCOUNTER — Emergency Department: Payer: Self-pay

## 2019-03-08 ENCOUNTER — Emergency Department
Admission: EM | Admit: 2019-03-08 | Discharge: 2019-03-08 | Disposition: A | Discharge status: Home / Self Care | Payer: Self-pay | Attending: Emergency Medicine | Admitting: Emergency Medicine

## 2019-03-08 ENCOUNTER — Other Ambulatory Visit: Payer: Self-pay

## 2019-03-08 DIAGNOSIS — R110 Nausea: Secondary | ICD-10-CM | POA: Insufficient documentation

## 2019-03-08 DIAGNOSIS — I4891 Unspecified atrial fibrillation: Secondary | ICD-10-CM | POA: Insufficient documentation

## 2019-03-08 DIAGNOSIS — F17210 Nicotine dependence, cigarettes, uncomplicated: Secondary | ICD-10-CM | POA: Insufficient documentation

## 2019-03-08 DIAGNOSIS — B370 Candidal stomatitis: Secondary | ICD-10-CM | POA: Insufficient documentation

## 2019-03-08 LAB — BASIC METABOLIC PANEL
Anion gap: 11 (ref 5–15)
BUN: <5 mg/dL — ABNORMAL LOW (ref 6–20)
CO2: 26 mmol/L (ref 22–32)
Calcium: 9.4 mg/dL (ref 8.9–10.3)
Chloride: 101 mmol/L (ref 98–111)
Creatinine, Ser: 0.91 mg/dL (ref 0.61–1.24)
GFR calc Af Amer: >60 mL/min (ref >60)
GFR calc non Af Amer: >60 mL/min (ref >60)
Glucose, Bld: 98 mg/dL (ref 70–99)
Potassium: 3.7 mmol/L (ref 3.5–5.1)
Sodium: 138 mmol/L (ref 135–145)

## 2019-03-08 LAB — HEPATIC FUNCTION PANEL
ALT: 67 U/L — ABNORMAL HIGH (ref 0–44)
AST: 72 U/L — ABNORMAL HIGH (ref 15–41)
Albumin: 3.9 g/dL (ref 3.5–5.0)
Alkaline Phosphatase: 94 U/L (ref 38–126)
Bilirubin, Direct: 0.3 mg/dL — ABNORMAL HIGH (ref 0.0–0.2)
Indirect Bilirubin: 0.7 mg/dL (ref 0.3–0.9)
Total Bilirubin: 1 mg/dL (ref 0.3–1.2)
Total Protein: 8.2 g/dL — ABNORMAL HIGH (ref 6.5–8.1)

## 2019-03-08 LAB — CBC
HCT: 48.1 % (ref 39.0–52.0)
Hemoglobin: 16.6 g/dL (ref 13.0–17.0)
MCH: 31.1 pg (ref 26.0–34.0)
MCHC: 34.5 g/dL (ref 30.0–36.0)
MCV: 90.1 fL (ref 80.0–100.0)
Platelets: 255 10*3/uL (ref 150–400)
RBC: 5.34 MIL/uL (ref 4.22–5.81)
RDW: 12.5 % (ref 11.5–15.5)
WBC: 4.8 10*3/uL (ref 4.0–10.5)
nRBC: 0 % (ref 0.0–0.2)

## 2019-03-08 LAB — LIPASE, BLOOD: Lipase: 59 U/L — ABNORMAL HIGH (ref 11–51)

## 2019-03-08 LAB — TROPONIN I
Troponin I: <0.03 ng/mL (ref <0.03)
Troponin I: <0.03 ng/mL (ref <0.03)

## 2019-03-08 MED ORDER — ACETAMINOPHEN 500 MG PO TABS
1000.0000 mg | ORAL_TABLET | ORAL | Status: AC
  Administered 2019-03-08: 1000 mg via ORAL
  Filled 2019-03-08: qty 2

## 2019-03-08 MED ORDER — ALUM & MAG HYDROXIDE-SIMETH 200-200-20 MG/5ML PO SUSP
15.0000 mL | Freq: Once | ORAL | Status: AC
  Administered 2019-03-08: 15 mL via ORAL
  Filled 2019-03-08: qty 30

## 2019-03-08 MED ORDER — ONDANSETRON 4 MG PO TBDP
4.0000 mg | ORAL_TABLET | Freq: Once | ORAL | Status: AC
  Administered 2019-03-08: 4 mg via ORAL
  Filled 2019-03-08: qty 1

## 2019-03-08 MED ORDER — CLOTRIMAZOLE 10 MG MT TROC: 10.0000 mg | Freq: Every day | OROMUCOSAL | 0 refills | Status: DC

## 2019-03-08 MED ORDER — DILTIAZEM HCL ER COATED BEADS 180 MG PO CP24
180.0000 mg | ORAL_CAPSULE | Freq: Every day | ORAL | Status: DC
  Administered 2019-03-08: 180 mg via ORAL
  Filled 2019-03-08: qty 1

## 2019-03-08 NOTE — ED Provider Notes (Signed)
[facility] Emergency Department Provider Note ____________________________________________   First MD Initiated Contact with Patient 03/08/19 2123     (approximate)  I have reviewed the triage vital signs and the nursing notes.  HISTORY  Chief Complaint Chest Pain and Nausea   HPI Bruce [name] is a 48 y.o. male reports to me the last evening  he is experiencing a discomfort when he swallows, little bit of discomfort his upper abdomen.  Occasional nausea.  Feels like a strain sense when he swallows things a bit of a burning start in his throat goes all the way down and when he eats things he feels uncomfortable.  He is able to swallow, but reports he just feels unusual or slow and burning.  No fevers or chills.  No chest pain except he reports that the burning discomfort.  Some occasional nausea but no vomiting.  Denies that he is having abdominal pain.  No diarrhea.  Still able to eat and drink but just does not feel very comfortable.  No fevers or chills.  No chest pain.  Having a mild headache, but reports that is not unusual for him and usually takes Tylenol and this, thing goes away.  Slight decreased appetite.  Has a history of chronic hepatitis but no abdominal pain.  Past Medical History:  Diagnosis Date  . Dental caries   . Hepatitis C infection   . Iritis   . PAF (paroxysmal atrial fibrillation) (HCC)    a. diagnosed 03/19/18; b. CHADS2VASc 0  . Polysubstance abuse (HCC)    a. cocaine, tobacco, etoh  . Scrotal mass   . Tick borne fever   . Uveitis     Patient Active Problem List   Diagnosis Date Noted  . A-fib (HCC) 09/13/2018  . Atrial fibrillation (HCC) 03/19/2018  . Abnormality of gait 02/12/2013  . Mandibular fracture (HCC) 12/25/2012  . Flu vaccine need 12/25/2012  . Scrotal mass 05/22/2012  . Dental caries 05/22/2012  . Hepatitis C infection 05/16/2012  . Myalgia 05/06/2012  . Arthralgia 05/06/2012  . Tick borne fever, likely  05/05/2012  . Tinea cruris, likely 05/05/2012  . Spermatocele of epididymis 05/01/2012  . Bilateral varicoceles 05/01/2012  . Bilateral hydrocele 05/01/2012    Past Surgical History:  Procedure Laterality Date  . FINGER SURGERY     infection, middle left  . fractured jaw    . SHOULDER SURGERY     right    Prior to Admission medications   Medication Sig Start Date End Date Taking? Authorizing Provider  clotrimazole (MYCELEX) 10 MG troche Take 1 tablet (10 mg total) by mouth 5 (five) times daily. 03/08/19   Sharyn Creamer, MD  diltiazem (CARDIZEM CD) 180 MG 24 hr capsule Take 1 capsule (180 mg total) by mouth daily. 11/22/18 02/20/19  Sondra Barges, PA-C  flecainide (TAMBOCOR) 50 MG tablet Take 1 tablet (50 mg total) by mouth 2 (two) times daily. 10/16/18 01/20/19  Sondra Barges, PA-C    Allergies Penicillins  Family History  Problem Relation Age of Onset  . Drug abuse Father     Social History Social History   Tobacco Use  . Smoking status: Current Some Day Smoker    Packs/day: 0.25    Types: Cigarettes  . Smokeless tobacco: Never Used  . Tobacco comment: Per pt  " I only smoke cigarettes when I drink beer"  Substance Use Topics  . Alcohol use: Yes    Comment: 6 pack per week  .  Drug use: Not Currently    Types: IV, Cocaine    Review of Systems Constitutional: No fever/chills Eyes: No visual changes. ENT: The HPI.  No dental pain.  No swelling in the mouth.  No trouble breathing. Cardiovascular: Denies chest pain. Respiratory: Denies shortness of breath. Gastrointestinal: No abdominal pain.   Genitourinary: Negative for dysuria. Musculoskeletal: Negative for back pain. Skin: Negative for rash. Neurological: Negative for areas of focal weakness or numbness.  Reports he is due to take his evening diltiazem dose, took his flecainide dose this morning  ____________________________________________   PHYSICAL EXAM:  VITAL SIGNS: ED Triage Vitals [03/08/19 1724]  Enc  Vitals Group     BP (!) 162/93     Pulse Rate 81     Resp 18     Temp 98.2 F (36.8 C)     Temp Source Oral     SpO2 98 %     Weight 170 lb (77.1 kg)     Height  (1.702 m)     Head Circumference      Peak Flow      Pain Score 6     Pain Loc      Pain Edu?      Excl. in GC?     Constitutional: Alert and oriented. Well appearing and in no acute distress. Eyes: Conjunctivae are normal. Head: Atraumatic. Nose: No congestion/rhinnorhea. Mouth/Throat: Mucous membranes are moist.  He has a grayish, fairly adherent thickened plaque over his tongue and slight spots on the posterior tonsillar pillars and posterior oropharynx that are similar.  Notes whitish, rather spotty in appearance.  No edema.  No anterior neck pain. Neck: No stridor.  Cardiovascular: Normal rate, regular rhythm. Grossly normal heart sounds.  Good peripheral circulation. Respiratory: Normal respiratory effort.  No retractions. Lungs CTAB. Gastrointestinal: Soft and nontender. No distention. Musculoskeletal: No lower extremity tenderness nor edema. Neurologic:  Normal speech and language. No gross focal neurologic deficits are appreciated.  Skin:  Skin is warm, dry and intact. No rash noted. Psychiatric: Mood and affect are normal. Speech and behavior are normal.  ____________________________________________   LABS (all labs ordered are listed, but only abnormal results are displayed)  Labs Reviewed  BASIC METABOLIC PANEL - Abnormal; Notable for the following components:      Result Value   BUN <5 (*)    All other components within normal limits  HEPATIC FUNCTION PANEL - Abnormal; Notable for the following components:   Total Protein 8.2 (*)    AST 72 (*)    ALT 67 (*)    Bilirubin, Direct 0.3 (*)    All other components within normal limits  LIPASE, BLOOD - Abnormal; Notable for the following components:   Lipase 59 (*)    All other components within normal limits  CBC  TROPONIN I  TROPONIN I    ____________________________________________  EKG  Reviewed enterotomy at 2155 Heart rate 60 QRS 80 QTc 450 Normal sinus rhythm no evidence of ischemia or ectopy ____________________________________________  RADIOLOGY  Dg Chest 2 View  Result Date: 03/08/2019 CLINICAL DATA:  Epigastric pain, difficulty swallowing and nausea since last night after dinner; history atrial fibrillation, smoker, substance abuse EXAM: CHEST - 2 VIEW COMPARISON:  01/20/2019 FINDINGS: Normal heart size, mediastinal contours, and pulmonary vascularity. Lungs clear. No pleural effusion or pneumothorax. Bones unremarkable. IMPRESSION: Normal exam. Electronically Signed   By: Ulyses Southward M.D.   On: 03/08/2019 19:14     ____________________________________________   PROCEDURES  Procedure(s) performed: None  Procedures  Critical Care performed: No  ____________________________________________   INITIAL IMPRESSION / ASSESSMENT AND PLAN / ED COURSE  Pertinent labs & imaging results that were available during my care of the patient were reviewed by me and considered in my medical decision making (see chart for details).   Transfer some nausea, some disc aphasia, some intolerance of foods of the last day and a half.  He is alert and well-appearing.  No evidence of acute pulmonary or cardiac etiology.  Reassuring neurologic and normal examination.  Reports a slight mild headache, but reports this is not infrequent and he treats these with Tylenol.  Not a sudden onset, not a worst headache of his life, and he reports he is happen from time to time and is not unusual.  He does however have evidence on examination that suggest he may have candidiasis, clinical examination is consistent with oral thrush.  I suspect this is likely causing some element of esophagitis as well causing his symptoms today.  Work-up otherwise reassuring.  Given the patient's cardiac history, 2 troponins were performed both normal.  He is  not having any pain reports a burning sensation midsternal which I suspect is esophagitis at this point.  Return precautions and treatment recommendations and follow-up discussed with the patient who is agreeable with the plan.       ____________________________________________   FINAL CLINICAL IMPRESSION(S) / ED DIAGNOSES  Final diagnoses:  Thrush, oral  Nausea        Note:  This document was prepared using Dragon voice recognition software and may include unintentional dictation errors       Sharyn CreamerQuale, Mark, MD 03/08/19 2321

## 2019-03-08 NOTE — ED Triage Notes (Signed)
Epigastric pain, difficulty swallowing and nausea since last night after eating dinner. Pt alert and oriented X4, active, cooperative, pt in NAD. RR even and unlabored, color WNL.

## 2019-03-08 NOTE — Discharge Instructions (Signed)
°  Please return to the emergency room right away if you are to develop a fever, severe nausea, your pain becomes severe or worsens, you have severe chest pain, a severe headache, you are unable to keep food down, begin vomiting any dark or bloody fluid, you develop any dark or bloody stools, feel dehydrated, or other new concerns or symptoms arise.

## 2019-04-03 ENCOUNTER — Emergency Department: Payer: Self-pay

## 2019-04-03 ENCOUNTER — Emergency Department
Admission: EM | Admit: 2019-04-03 | Discharge: 2019-04-03 | Disposition: A | Discharge status: Home / Self Care | Payer: Self-pay | Attending: Emergency Medicine | Admitting: Emergency Medicine

## 2019-04-03 ENCOUNTER — Other Ambulatory Visit: Payer: Self-pay

## 2019-04-03 DIAGNOSIS — K2920 Alcoholic gastritis without bleeding: Secondary | ICD-10-CM | POA: Insufficient documentation

## 2019-04-03 DIAGNOSIS — R0789 Other chest pain: Secondary | ICD-10-CM | POA: Insufficient documentation

## 2019-04-03 DIAGNOSIS — K219 Gastro-esophageal reflux disease without esophagitis: Secondary | ICD-10-CM | POA: Insufficient documentation

## 2019-04-03 DIAGNOSIS — Z79899 Other long term (current) drug therapy: Secondary | ICD-10-CM | POA: Insufficient documentation

## 2019-04-03 DIAGNOSIS — R070 Pain in throat: Secondary | ICD-10-CM | POA: Insufficient documentation

## 2019-04-03 DIAGNOSIS — F17210 Nicotine dependence, cigarettes, uncomplicated: Secondary | ICD-10-CM | POA: Insufficient documentation

## 2019-04-03 HISTORY — DX: Unspecified atrial fibrillation: I48.91

## 2019-04-03 LAB — BASIC METABOLIC PANEL
Anion gap: 11 (ref 5–15)
BUN: 6 mg/dL (ref 6–20)
CO2: 26 mmol/L (ref 22–32)
Calcium: 9 mg/dL (ref 8.9–10.3)
Chloride: 103 mmol/L (ref 98–111)
Creatinine, Ser: 0.81 mg/dL (ref 0.61–1.24)
GFR calc Af Amer: >60 mL/min (ref >60)
GFR calc non Af Amer: >60 mL/min (ref >60)
Glucose, Bld: 154 mg/dL — ABNORMAL HIGH (ref 70–99)
Potassium: 3.6 mmol/L (ref 3.5–5.1)
Sodium: 140 mmol/L (ref 135–145)

## 2019-04-03 LAB — CBC
HCT: 44.9 % (ref 39.0–52.0)
Hemoglobin: 16 g/dL (ref 13.0–17.0)
MCH: 31.2 pg (ref 26.0–34.0)
MCHC: 35.6 g/dL (ref 30.0–36.0)
MCV: 87.5 fL (ref 80.0–100.0)
Platelets: 226 10*3/uL (ref 150–400)
RBC: 5.13 MIL/uL (ref 4.22–5.81)
RDW: 12.5 % (ref 11.5–15.5)
WBC: 3.8 10*3/uL — ABNORMAL LOW (ref 4.0–10.5)
nRBC: 0 % (ref 0.0–0.2)

## 2019-04-03 LAB — ETHANOL: Alcohol, Ethyl (B): 59 mg/dL — ABNORMAL HIGH (ref <10)

## 2019-04-03 LAB — TROPONIN I
Troponin I: <0.03 ng/mL (ref <0.03)
Troponin I: <0.03 ng/mL (ref <0.03)

## 2019-04-03 MED ORDER — KETOROLAC TROMETHAMINE 10 MG PO TABS
10.0000 mg | ORAL_TABLET | Freq: Once | ORAL | Status: AC
  Administered 2019-04-03: 10 mg via ORAL
  Filled 2019-04-03: qty 1

## 2019-04-03 MED ORDER — ALUMINUM-MAGNESIUM-SIMETHICONE 200-200-20 MG/5ML PO SUSP: 30.0000 mL | Freq: Three times a day (TID) | ORAL | 0 refills | Status: DC

## 2019-04-03 MED ORDER — FAMOTIDINE 20 MG PO TABS
40.0000 mg | ORAL_TABLET | Freq: Once | ORAL | Status: AC
  Administered 2019-04-03: 40 mg via ORAL
  Filled 2019-04-03: qty 2

## 2019-04-03 MED ORDER — LIDOCAINE VISCOUS HCL 2 % MT SOLN
15.0000 mL | Freq: Once | OROMUCOSAL | Status: AC
  Administered 2019-04-03: 15 mL via OROMUCOSAL
  Filled 2019-04-03: qty 15

## 2019-04-03 MED ORDER — LIDOCAINE 5 % EX PTCH
1.0000 | MEDICATED_PATCH | CUTANEOUS | Status: DC
  Administered 2019-04-03: 1 via TRANSDERMAL
  Filled 2019-04-03: qty 1

## 2019-04-03 MED ORDER — ALUM & MAG HYDROXIDE-SIMETH 200-200-20 MG/5ML PO SUSP
30.0000 mL | Freq: Once | ORAL | Status: AC
  Administered 2019-04-03: 30 mL via ORAL
  Filled 2019-04-03: qty 30

## 2019-04-03 MED ORDER — SODIUM CHLORIDE 0.9% FLUSH
3.0000 mL | Freq: Once | INTRAVENOUS | Status: AC
  Administered 2019-04-03: 3 mL via INTRAVENOUS

## 2019-04-03 MED ORDER — FAMOTIDINE 20 MG PO TABS: 20.0000 mg | ORAL_TABLET | Freq: Two times a day (BID) | ORAL | 2 refills | Status: DC

## 2019-04-03 NOTE — ED Triage Notes (Signed)
Pt c/o aching pain in the left shoulder radiating down the arm,. Pt states he has a hx of a-fib, "my chest is not hurting",. Denies SOB denies injury, states it just started hurting 2 hours ago.

## 2019-04-03 NOTE — ED Provider Notes (Signed)
[facility] Emergency Department Provider Note  ____________________________________________  Time seen: Approximately 9:35 AM  I have reviewed the triage vital signs and the nursing notes.   HISTORY  Chief Complaint Shoulder Pain  Level 5 Caveat: Portions of the History and Physical including HPI and review of systems are unable to be completely obtained due to patient being a poor historian    HPI Bruce [name] is a 48 y.o. male with a history of atrial fibrillation, hepatitis C, polysubstance abuse  who complains of left shoulder pain that started at about 6:00 AM today.  Gradual onset, nonradiating.  Described as sharp.  No aggravating or alleviating factors.  No exertional symptoms recently.  Denies shortness of breath.  Not pleuritic.  No nausea or vomiting.  While in the ED, he reports that about 9:00 AM the pain also spread to the left lateral chest.  Otherwise same quality.  He does also describe waking up with a sore throat.  He takes Pepto-Bismol sometimes, but does not take a daily antacid.     Past Medical History:  Diagnosis Date  . A-fib (HCC)   . Dental caries   . Hepatitis C infection   . Iritis   . PAF (paroxysmal atrial fibrillation) (HCC)    a. diagnosed 03/19/18; b. CHADS2VASc 0  . Polysubstance abuse (HCC)    a. cocaine, tobacco, etoh  . Scrotal mass   . Tick borne fever   . Uveitis      Patient Active Problem List   Diagnosis Date Noted  . A-fib (HCC) 09/13/2018  . Atrial fibrillation (HCC) 03/19/2018  . Abnormality of gait 02/12/2013  . Mandibular fracture (HCC) 12/25/2012  . Flu vaccine need 12/25/2012  . Scrotal mass 05/22/2012  . Dental caries 05/22/2012  . Hepatitis C infection 05/16/2012  . Myalgia 05/06/2012  . Arthralgia 05/06/2012  . Tick borne fever, likely 05/05/2012  . Tinea cruris, likely 05/05/2012  . Spermatocele of epididymis 05/01/2012  . Bilateral varicoceles 05/01/2012  . Bilateral hydrocele  05/01/2012     Past Surgical History:  Procedure Laterality Date  . FINGER SURGERY     infection, middle left  . fractured jaw    . SHOULDER SURGERY     right     Prior to Admission medications   Medication Sig Start Date End Date Taking? Authorizing Provider  aluminum-magnesium hydroxide-simethicone (MAALOX) 200-200-20 MG/5ML SUSP Take 30 mLs by mouth 4 (four) times daily -  before meals and at bedtime. 04/03/19   Sharman CheekStafford, Trystin Hargrove, MD  clotrimazole (MYCELEX) 10 MG troche Take 1 tablet (10 mg total) by mouth 5 (five) times daily. 03/08/19   Sharyn CreamerQuale, Mark, MD  diltiazem (CARDIZEM CD) 180 MG 24 hr capsule Take 1 capsule (180 mg total) by mouth daily. 11/22/18 02/20/19  [name], Ryan M, PA-C  famotidine (PEPCID) 20 MG tablet Take 1 tablet (20 mg total) by mouth 2 (two) times daily. 04/03/19 07/02/19  Sharman CheekStafford, Alawna Graybeal, MD  flecainide (TAMBOCOR) 50 MG tablet Take 1 tablet (50 mg total) by mouth 2 (two) times daily. 10/16/18 01/20/19  [name], Ryan M, PA-C     Allergies Penicillins   Family History  Problem Relation Age of Onset  . Drug abuse Father     Social History Social History   Tobacco Use  . Smoking status: Current Some Day Smoker    Packs/day: 0.25    Types: Cigarettes  . Smokeless tobacco: Never Used  . Tobacco comment: Per pt  " I only smoke cigarettes  when I drink beer"  Substance Use Topics  . Alcohol use: Yes    Comment: 6 pack per week  . Drug use: Not Currently    Types: IV, Cocaine    Review of Systems  Constitutional:   No fever or chills.  ENT:   No sore throat. No rhinorrhea. Cardiovascular: Positive as above left chest pain without syncope. Respiratory:   No dyspnea or cough. Gastrointestinal:   Negative for abdominal pain, vomiting and diarrhea.  Musculoskeletal:   Positive left shoulder pain without injury All other systems reviewed and are negative except as documented above in ROS and HPI.  ____________________________________________   PHYSICAL  EXAM:  VITAL SIGNS: ED Triage Vitals [04/03/19 0811]  Enc Vitals Group     BP (!) 144/93     Pulse Rate 73     Resp 17     Temp 98.2 F (36.8 C)     Temp Source Oral     SpO2 97 %     Weight 172 lb (78 kg)     Height 5\' 7"  (1.702 m)     Head Circumference      Peak Flow      Pain Score 8     Pain Loc      Pain Edu?      Excl. in GC?     Vital signs reviewed, nursing assessments reviewed.   Constitutional:   Alert and oriented. Non-toxic appearance. Eyes:   Conjunctivae are normal. EOMI. PERRL. ENT      Head:   Normocephalic and atraumatic.      Nose:   No congestion/rhinnorhea.       Mouth/Throat:   MMM, no pharyngeal erythema. No peritonsillar mass.       Neck:   No meningismus. Full ROM. Hematological/Lymphatic/Immunilogical:   No cervical lymphadenopathy. Cardiovascular:   RRR. Symmetric bilateral radial and DP pulses.  No murmurs. Cap refill less than 2 seconds. Respiratory:   Normal respiratory effort without tachypnea/retractions. Breath sounds are clear and equal bilaterally. No wheezes/rales/rhonchi. Gastrointestinal:   Soft and nontender. Non distended. There is no CVA tenderness.  No rebound, rigidity, or guarding. Musculoskeletal:   Normal range of motion in all extremities. No joint effusions.  No lower extremity tenderness.  No edema.  There is point tenderness of the left lateral chest just below the axilla where there is tense musculature.  This reproduces the pain with exquisite tenderness.  No inflammatory changes in the area.  Does not appear to be an axillary lymph node.  Left shoulder pain is reproduced by rotator cuff/pectoralis stress having the patient push forward against resistance.  Neurologic:   Normal speech and language.  Motor grossly intact. No acute focal neurologic deficits are appreciated.  Skin:    Skin is warm, dry and intact. No rash noted.  No petechiae, purpura, or bullae.  ____________________________________________    LABS  (pertinent positives/negatives) (all labs ordered are listed, but only abnormal results are displayed) Labs Reviewed  BASIC METABOLIC PANEL - Abnormal; Notable for the following components:      Result Value   Glucose, Bld 154 (*)    All other components within normal limits  CBC - Abnormal; Notable for the following components:   WBC 3.8 (*)    All other components within normal limits  ETHANOL - Abnormal; Notable for the following components:   Alcohol, Ethyl (B) 59 (*)    All other components within normal limits  TROPONIN I  TROPONIN I  ____________________________________________   EKG  Interpreted by me  Date: 04/03/2019  Rate: 65  Rhythm: normal sinus rhythm  QRS Axis: normal  Intervals: normal  ST/T Wave abnormalities: normal  Conduction Disutrbances: none  Narrative Interpretation: unremarkable  Repeat EKG performed at 9:00 AM, interpreted by me  Date: 04/03/2019  Rate: 92  Rhythm: normal sinus rhythm  QRS Axis: normal  Intervals: normal  ST/T Wave abnormalities: normal  Conduction Disutrbances: none  Narrative Interpretation: unremarkable         ____________________________________________    RADIOLOGY  Dg Chest 2 View  Result Date: 04/03/2019 CLINICAL DATA:  Aching left shoulder pain radiating down left arm. Chest pain. EXAM: CHEST - 2 VIEW COMPARISON:  03/08/2019 FINDINGS: The heart size and mediastinal contours are within normal limits. Both lungs are clear. The visualized skeletal structures are unremarkable. IMPRESSION: No active cardiopulmonary disease. Electronically Signed   By: Charlett Nose M.D.   On: 04/03/2019 08:55    ____________________________________________   PROCEDURES Procedures  ____________________________________________    CLINICAL IMPRESSION / ASSESSMENT AND PLAN / ED COURSE  Medications ordered in the ED: Medications  lidocaine (LIDODERM) 5 % 1 patch (1 patch Transdermal Patch Applied 04/03/19 0939)  sodium  chloride flush (NS) 0.9 % injection 3 mL (3 mLs Intravenous Given 04/03/19 1127)  lidocaine (XYLOCAINE) 2 % viscous mouth solution 15 mL (15 mLs Mouth/Throat Given 04/03/19 0939)  alum & mag hydroxide-simeth (MAALOX/MYLANTA) 200-200-20 MG/5ML suspension 30 mL (30 mLs Oral Given 04/03/19 0939)  famotidine (PEPCID) tablet 40 mg (40 mg Oral Given 04/03/19 1049)  ketorolac (TORADOL) tablet 10 mg (10 mg Oral Given 04/03/19 1051)    Pertinent labs & imaging results that were available during my care of the patient were reviewed by me and considered in my medical decision making (see chart for details).  Md Smola was evaluated in Emergency Department on 04/03/2019 for the symptoms described in the history of present illness. He was evaluated in the context of the global COVID-19 pandemic, which necessitated consideration that the patient might be at risk for infection with the SARS-CoV-2 virus that causes COVID-19. Institutional protocols and algorithms that pertain to the evaluation of patients at risk for COVID-19 are in a state of rapid change based on information released by regulatory bodies including the CDC and federal and state organizations. These policies and algorithms were followed during the patient's care in the ED.   Patient presents with clinically musculoskeletal pain of the left shoulder and left chest.Considering the patient's symptoms, medical history, and physical examination today, I have low suspicion for ACS, PE, TAD, pneumothorax, carditis, mediastinitis, pneumonia, CHF, or sepsis.  No signs of infectious process.  He has a history of atrial fibrillation, not maintained on any antiplatelets or anticoagulants but only diltiazem and flecainide.  He is currently in a sinus rhythm.  I will treated a lidocaine patch over the left chest wall.  He is also having GERD symptoms which I will treat with viscous lidocaine and Maalox for now.  I will check a second troponin due to his A. fib and  substance abuse history but otherwise plan for outpatient follow-up for his chest wall pain. Clinical Course as of Apr 02 1201  Wed Apr 03, 2019  1158 Elevated ethanol level consistent with alcoholic gastritis.  Feeling better after an acids.  Repeat troponin negative.  Stable for discharge  Alcohol, Ethyl (B)(!): 59 [PS]    Clinical Course User Index [PS] Sharman Cheek, MD     ____________________________________________  FINAL CLINICAL IMPRESSION(S) / ED DIAGNOSES    Final diagnoses:  Left-sided chest wall pain  Gastroesophageal reflux disease, esophagitis presence not specified  Acute alcoholic gastritis without hemorrhage     ED Discharge Orders         Ordered    aluminum-magnesium hydroxide-simethicone (MAALOX) 200-200-20 MG/5ML SUSP  3 times daily before meals & bedtime     04/03/19 1200    famotidine (PEPCID) 20 MG tablet  2 times daily     04/03/19 1200          Portions of this note were generated with dragon dictation software. Dictation errors may occur despite best attempts at proofreading.   Sharman Cheek, MD 04/03/19 1202

## 2019-04-03 NOTE — ED Notes (Signed)
This RN to bedside at this time, pt c/o sudden onset 10/10 L sided chest/axilla pain. Pt noted to be holding L axilla upon this RN arrival to bedside. Repeat EKG obtained by this RN. MD at bedside at this time, repeat EKG given to MD.

## 2019-04-03 NOTE — Discharge Instructions (Signed)
Your symptoms today appear to be due to muscle strain of the left chest wall, as well as irritation of the stomach from acid reflux and alcohol.  You should avoid drinking alcohol in the future as it will continue to irritate your stomach.  Take Pepcid 2 times a day for the next month to help alleviate your symptoms.

## 2019-04-18 ENCOUNTER — Emergency Department: Payer: Self-pay

## 2019-04-18 ENCOUNTER — Other Ambulatory Visit: Payer: Self-pay

## 2019-04-18 ENCOUNTER — Emergency Department
Admission: EM | Admit: 2019-04-18 | Discharge: 2019-04-18 | Disposition: A | Discharge status: Home / Self Care | Payer: Self-pay | Attending: Emergency Medicine | Admitting: Emergency Medicine

## 2019-04-18 DIAGNOSIS — R1111 Vomiting without nausea: Secondary | ICD-10-CM

## 2019-04-18 DIAGNOSIS — F17210 Nicotine dependence, cigarettes, uncomplicated: Secondary | ICD-10-CM | POA: Insufficient documentation

## 2019-04-18 DIAGNOSIS — R197 Diarrhea, unspecified: Secondary | ICD-10-CM | POA: Insufficient documentation

## 2019-04-18 DIAGNOSIS — Z79899 Other long term (current) drug therapy: Secondary | ICD-10-CM | POA: Insufficient documentation

## 2019-04-18 DIAGNOSIS — F1410 Cocaine abuse, uncomplicated: Secondary | ICD-10-CM | POA: Insufficient documentation

## 2019-04-18 LAB — URINALYSIS, COMPLETE (UACMP) WITH MICROSCOPIC
Bacteria, UA: NONE SEEN
Bilirubin Urine: NEGATIVE
Glucose, UA: NEGATIVE mg/dL
Hgb urine dipstick: NEGATIVE
Ketones, ur: 20 mg/dL — AB
Leukocytes,Ua: NEGATIVE
Nitrite: NEGATIVE
Protein, ur: NEGATIVE mg/dL
Specific Gravity, Urine: 1.015 (ref 1.005–1.030)
Squamous Epithelial / HPF: NONE SEEN (ref 0–5)
pH: 6 (ref 5.0–8.0)

## 2019-04-18 LAB — HEPATIC FUNCTION PANEL
ALT: 126 U/L — ABNORMAL HIGH (ref 0–44)
AST: 185 U/L — ABNORMAL HIGH (ref 15–41)
Albumin: 4.3 g/dL (ref 3.5–5.0)
Alkaline Phosphatase: 90 U/L (ref 38–126)
Bilirubin, Direct: 0.4 mg/dL — ABNORMAL HIGH (ref 0.0–0.2)
Indirect Bilirubin: 0.8 mg/dL (ref 0.3–0.9)
Total Bilirubin: 1.2 mg/dL (ref 0.3–1.2)
Total Protein: 8.4 g/dL — ABNORMAL HIGH (ref 6.5–8.1)

## 2019-04-18 LAB — GLUCOSE, CAPILLARY: Glucose-Capillary: 80 mg/dL (ref 70–99)

## 2019-04-18 LAB — BASIC METABOLIC PANEL
Anion gap: 13 (ref 5–15)
BUN: 7 mg/dL (ref 6–20)
CO2: 23 mmol/L (ref 22–32)
Calcium: 9.3 mg/dL (ref 8.9–10.3)
Chloride: 105 mmol/L (ref 98–111)
Creatinine, Ser: 0.69 mg/dL (ref 0.61–1.24)
GFR calc Af Amer: >60 mL/min (ref >60)
GFR calc non Af Amer: >60 mL/min (ref >60)
Glucose, Bld: 97 mg/dL (ref 70–99)
Potassium: 3.8 mmol/L (ref 3.5–5.1)
Sodium: 141 mmol/L (ref 135–145)

## 2019-04-18 LAB — URINE DRUG SCREEN, QUALITATIVE (ARMC ONLY)
Amphetamines, Ur Screen: NOT DETECTED
Barbiturates, Ur Screen: NOT DETECTED
Benzodiazepine, Ur Scrn: NOT DETECTED
Cannabinoid 50 Ng, Ur ~~LOC~~: NOT DETECTED
Cocaine Metabolite,Ur ~~LOC~~: POSITIVE — AB
MDMA (Ecstasy)Ur Screen: NOT DETECTED
Methadone Scn, Ur: NOT DETECTED
Opiate, Ur Screen: NOT DETECTED
Phencyclidine (PCP) Ur S: NOT DETECTED
Tricyclic, Ur Screen: NOT DETECTED

## 2019-04-18 LAB — TROPONIN I
Troponin I: <0.03 ng/mL (ref <0.03)
Troponin I: <0.03 ng/mL (ref <0.03)

## 2019-04-18 LAB — ETHANOL: Alcohol, Ethyl (B): <10 mg/dL (ref <10)

## 2019-04-18 LAB — CBC
HCT: 47.5 % (ref 39.0–52.0)
Hemoglobin: 16.8 g/dL (ref 13.0–17.0)
MCH: 31.1 pg (ref 26.0–34.0)
MCHC: 35.4 g/dL (ref 30.0–36.0)
MCV: 88 fL (ref 80.0–100.0)
Platelets: 146 10*3/uL — ABNORMAL LOW (ref 150–400)
RBC: 5.4 MIL/uL (ref 4.22–5.81)
RDW: 12.4 % (ref 11.5–15.5)
WBC: 3.2 10*3/uL — ABNORMAL LOW (ref 4.0–10.5)
nRBC: 0 % (ref 0.0–0.2)

## 2019-04-18 LAB — LIPASE, BLOOD: Lipase: 97 U/L — ABNORMAL HIGH (ref 11–51)

## 2019-04-18 MED ORDER — ONDANSETRON HCL 4 MG PO TABS: 4.0000 mg | ORAL_TABLET | Freq: Three times a day (TID) | ORAL | 0 refills | Status: DC | PRN

## 2019-04-18 MED ORDER — ACETAMINOPHEN 325 MG PO TABS
650.0000 mg | ORAL_TABLET | Freq: Once | ORAL | Status: AC
  Administered 2019-04-18: 19:00:00 650 mg via ORAL
  Filled 2019-04-18: qty 2

## 2019-04-18 MED ORDER — SODIUM CHLORIDE 0.9 % IV BOLUS
1000.0000 mL | Freq: Once | INTRAVENOUS | Status: AC
  Administered 2019-04-18: 1000 mL via INTRAVENOUS

## 2019-04-18 MED ORDER — SODIUM CHLORIDE 0.9% FLUSH
3.0000 mL | Freq: Once | INTRAVENOUS | Status: AC
  Administered 2019-04-18: 14:00:00 3 mL via INTRAVENOUS

## 2019-04-18 NOTE — ED Triage Notes (Signed)
Pt c/o chest pain that started this morning with N/V/D, states he has a hx of a-fib and when he has chest pain he had the vomiting and diarrhea.

## 2019-04-18 NOTE — ED Notes (Signed)
Pt reports pain to his back, neck and side and reports needs something for pain. MD and RN aware. Call bell in reach. Will continue to assess.

## 2019-04-18 NOTE — ED Provider Notes (Addendum)
[facility] Emergency Department Provider Note  ____________________________________________   I have reviewed the triage vital signs and the nursing notes. Where available I have reviewed prior notes and, if possible and indicated, outside hospital notes.    HISTORY  Chief Complaint Chest Pain    HPI Bruce [name] is a 48 y.o. male patient seen and evaluated during the coronavirus epidemic during a time with low staffing with a history of polysubstance abuse including cocaine tobacco and EtOH, last alcohol was 3 days ago, today he began to have some vomiting.  No history of withdrawal.  He had some loose stools as well.  No abdominal pain.  No chest pain or shortness of breath.  He states it was uncomfortable for up at one point.  Patient is very frequently here.  He states he had thrush Once upon a time he is worried the thrush is back.  Has not seen any whiteness on his tongue.  He was worried because he was throwing up.  No complaints.   Patient has taken a nap since he is been here and has eat and drink and had no difficulty.  He does not have a ongoing sensation of foreign body he is just very worried that the thrush might be back.  He also admits to numerous nonbloody episodes of diarrhea.  He initially denied using cocaine for "a long time".  Past Medical History:  Diagnosis Date  . A-fib (HCC)   . Dental caries   . Hepatitis C infection   . Iritis   . PAF (paroxysmal atrial fibrillation) (HCC)    a. diagnosed 03/19/18; b. CHADS2VASc 0  . Polysubstance abuse (HCC)    a. cocaine, tobacco, etoh  . Scrotal mass   . Tick borne fever   . Uveitis     Patient Active Problem List   Diagnosis Date Noted  . A-fib (HCC) 09/13/2018  . Atrial fibrillation (HCC) 03/19/2018  . Abnormality of gait 02/12/2013  . Mandibular fracture (HCC) 12/25/2012  . Flu vaccine need 12/25/2012  . Scrotal mass 05/22/2012  . Dental caries 05/22/2012  . Hepatitis C infection  05/16/2012  . Myalgia 05/06/2012  . Arthralgia 05/06/2012  . Tick borne fever, likely 05/05/2012  . Tinea cruris, likely 05/05/2012  . Spermatocele of epididymis 05/01/2012  . Bilateral varicoceles 05/01/2012  . Bilateral hydrocele 05/01/2012    Past Surgical History:  Procedure Laterality Date  . FINGER SURGERY     infection, middle left  . fractured jaw    . SHOULDER SURGERY     right    Prior to Admission medications   Medication Sig Start Date End Date Taking? Authorizing Provider  aluminum-magnesium hydroxide-simethicone (MAALOX) 200-200-20 MG/5ML SUSP Take 30 mLs by mouth 4 (four) times daily -  before meals and at bedtime. 04/03/19   Sharman CheekStafford, Phillip, MD  clotrimazole (MYCELEX) 10 MG troche Take 1 tablet (10 mg total) by mouth 5 (five) times daily. 03/08/19   Sharyn CreamerQuale, Mark, MD  diltiazem (CARDIZEM CD) 180 MG 24 hr capsule Take 1 capsule (180 mg total) by mouth daily. 11/22/18 02/20/19  [name], Ryan M, PA-C  famotidine (PEPCID) 20 MG tablet Take 1 tablet (20 mg total) by mouth 2 (two) times daily. 04/03/19 07/02/19  Sharman CheekStafford, Phillip, MD  flecainide (TAMBOCOR) 50 MG tablet Take 1 tablet (50 mg total) by mouth 2 (two) times daily. 10/16/18 01/20/19  [name], Ryan M, PA-C    Allergies Penicillins  Family History  Problem Relation Age of Onset  .  Drug abuse Father     Social History Social History   Tobacco Use  . Smoking status: Current Some Day Smoker    Packs/day: 0.25    Types: Cigarettes  . Smokeless tobacco: Never Used  . Tobacco comment: Per pt  " I only smoke cigarettes when I drink beer"  Substance Use Topics  . Alcohol use: Yes    Comment: 6 pack per week  . Drug use: Not Currently    Types: IV, Cocaine    Review of Systems Constitutional: No fever/chills Eyes: No visual changes. ENT: No sore throat. No stiff neck no neck pain Cardiovascular: Denies chest pain. Respiratory: Denies shortness of breath. Gastrointestinal: HPI, positive for nausea vomiting  diarrhea Genitourinary: Negative for dysuria. Musculoskeletal: Negative lower extremity swelling Skin: Negative for rash. Neurological: Negative for severe headaches, focal weakness or numbness.   ____________________________________________   PHYSICAL EXAM:  VITAL SIGNS: ED Triage Vitals  Enc Vitals Group     BP 04/18/19 1629 (!) 155/96     Pulse Rate 04/18/19 1629 86     Resp 04/18/19 1629 17     Temp --      Temp src --      SpO2 04/18/19 1629 100 %     Weight 04/18/19 1332 170 lb (77.1 kg)     Height 04/18/19 1332 5\' 7"  (1.702 m)     Head Circumference --      Peak Flow --      Pain Score 04/18/19 1332 8     Pain Loc --      Pain Edu? --      Excl. in [place]? --     Constitutional: Alert and oriented. Well appearing and in no acute distress.  Patient is quite anxious Eyes: Conjunctivae are normal Head: Atraumatic HEENT: No congestion/rhinnorhea. Mucous membranes are moist.  Oropharynx non-erythematous thrush noted Neck:   Nontender with no meningismus, no masses, no stridor Cardiovascular: Normal rate, regular rhythm. Grossly normal heart sounds.  Good peripheral circulation. Respiratory: Normal respiratory effort.  No retractions. Lungs CTAB. Abdominal: Soft and nontender. No distention. No guarding no rebound Back:  There is no focal tenderness or step off.  there is no midline tenderness there are no lesions noted. there is no CVA tenderness  Musculoskeletal: No lower extremity tenderness, no upper extremity tenderness. No joint effusions, no DVT signs strong distal pulses no edema Neurologic:  Normal speech and language. No gross focal neurologic deficits are appreciated.  Skin:  Skin is warm, dry and intact. No rash noted. Psychiatric: Mood and affect are anxious. Speech and behavior are normal.  ____________________________________________   LABS (all labs ordered are listed, but only abnormal results are displayed)  Labs Reviewed  CBC - Abnormal; Notable for  the following components:      Result Value   WBC 3.2 (*)    Platelets 146 (*)    All other components within normal limits  BASIC METABOLIC PANEL  TROPONIN I  ETHANOL  URINE DRUG SCREEN, QUALITATIVE ([facility] ONLY)  URINALYSIS, COMPLETE (UACMP) WITH MICROSCOPIC  HEPATIC FUNCTION PANEL  LIPASE, BLOOD  CBG MONITORING, ED    Pertinent labs  results that were available during my care of the patient were reviewed by me and considered in my medical decision making (see chart for details). ____________________________________________  EKG  I personally interpreted any EKGs ordered by me or triage Sinus rhythm at 77 bpm no acute ST elevation depression normal axis unremarkable EKG. ____________________________________________  RADIOLOGY  Pertinent labs & imaging results that were available during my care of the patient were reviewed by me and considered in my medical decision making (see chart for details). If possible, patient and/or family made aware of any abnormal findings.  Dg Chest 2 View  Result Date: 04/18/2019 CLINICAL DATA:  Chest pain EXAM: CHEST - 2 VIEW COMPARISON:  04/03/2019 FINDINGS: The heart size and mediastinal contours are within normal limits. Both lungs are clear. The visualized skeletal structures are unremarkable. IMPRESSION: No active cardiopulmonary disease. Electronically Signed   By: Donavan Foil M.D.   On: 04/18/2019 14:56   ____________________________________________    PROCEDURES  Procedure(s) performed: None  Procedures  Critical Care performed: None  ____________________________________________   INITIAL IMPRESSION / ASSESSMENT AND PLAN / ED COURSE  Pertinent labs & imaging results that were available during my care of the patient were reviewed by me and considered in my medical decision making (see chart for details).  Patient here concerned about thrush there is no evidence of thrush, he has had nausea vomiting diarrhea, no evidence of  alcohol withdrawal fortunately, we will give him some IV fluids, check liver function tests and lipase, and reassess abdomen is benign  ----------------------------------------- 7:50 PM on 04/18/2019 -----------------------------------------  In no acute distress, patient is again positive for cocaine.  Patient counseled about cocaine abuse.  No evidence of cocaine associated injury however.  Blood work is reassuring including 2- troponins.  We will discharge him he is in no acute distress.  I will refer him to [facility] for cocaine abuse.  No indication for admission and patient is eager to go home.    ____________________________________________   FINAL CLINICAL IMPRESSION(S) / ED DIAGNOSES  Final diagnoses:  None      This chart was dictated using voice recognition software.  Despite best efforts to proofread,  errors can occur which can change meaning.      Schuyler Amor, MD 04/18/19 1654    Schuyler Amor, MD 04/18/19 1950    Schuyler Amor, MD 04/18/19 [phone]

## 2019-04-18 NOTE — ED Notes (Signed)
Reports feeling like something in his throat, causing him to gag and vomit since this morning. Reports nausea started yesterday, but subsided on its own, today unable to stop vomiting.

## 2019-06-17 IMAGING — CR CHEST - 2 VIEW
2 series · 2 of 2 positions shown · non-contrast
Comparison: 04/03/2019

CLINICAL DATA: Chest pain

EXAM:
CHEST - 2 VIEW

[chest pa]
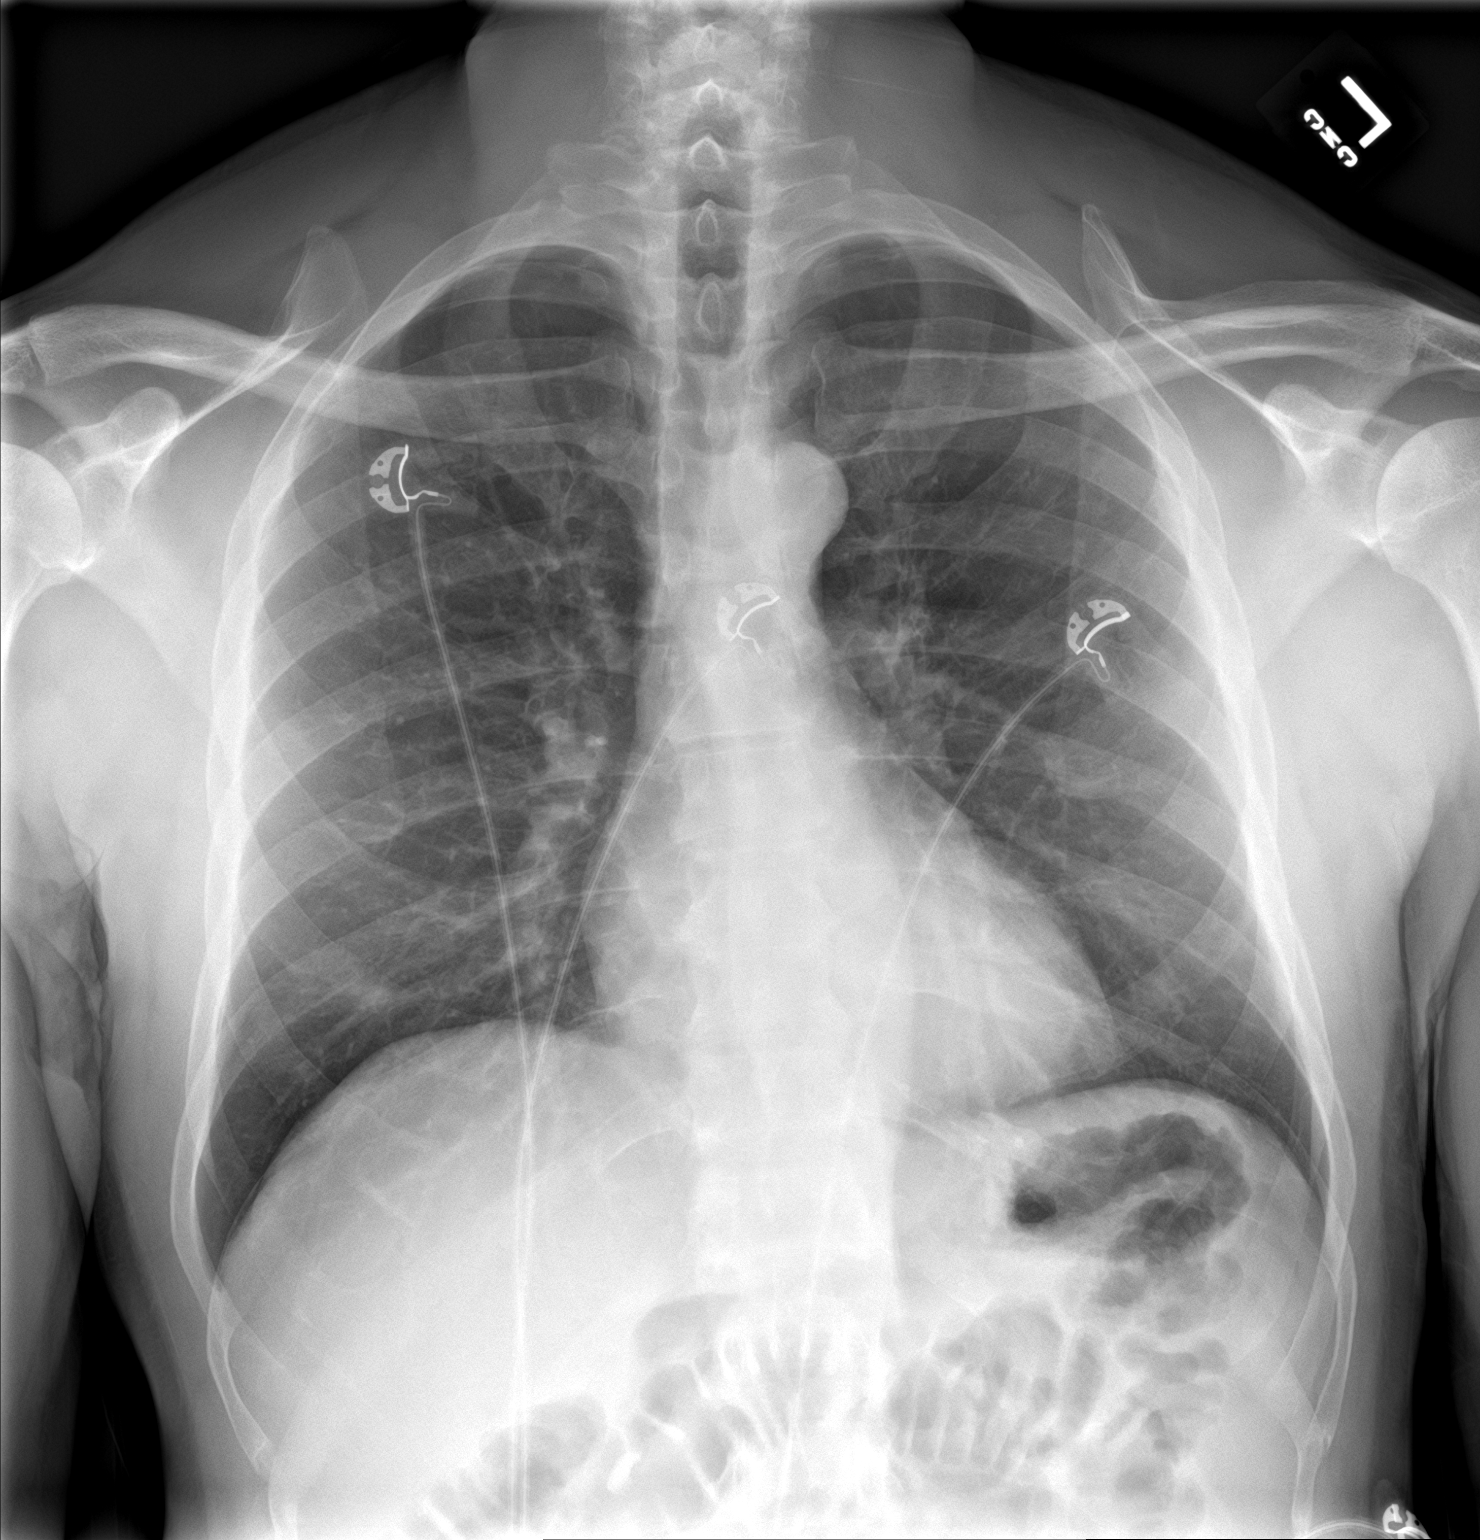

[chest lat]
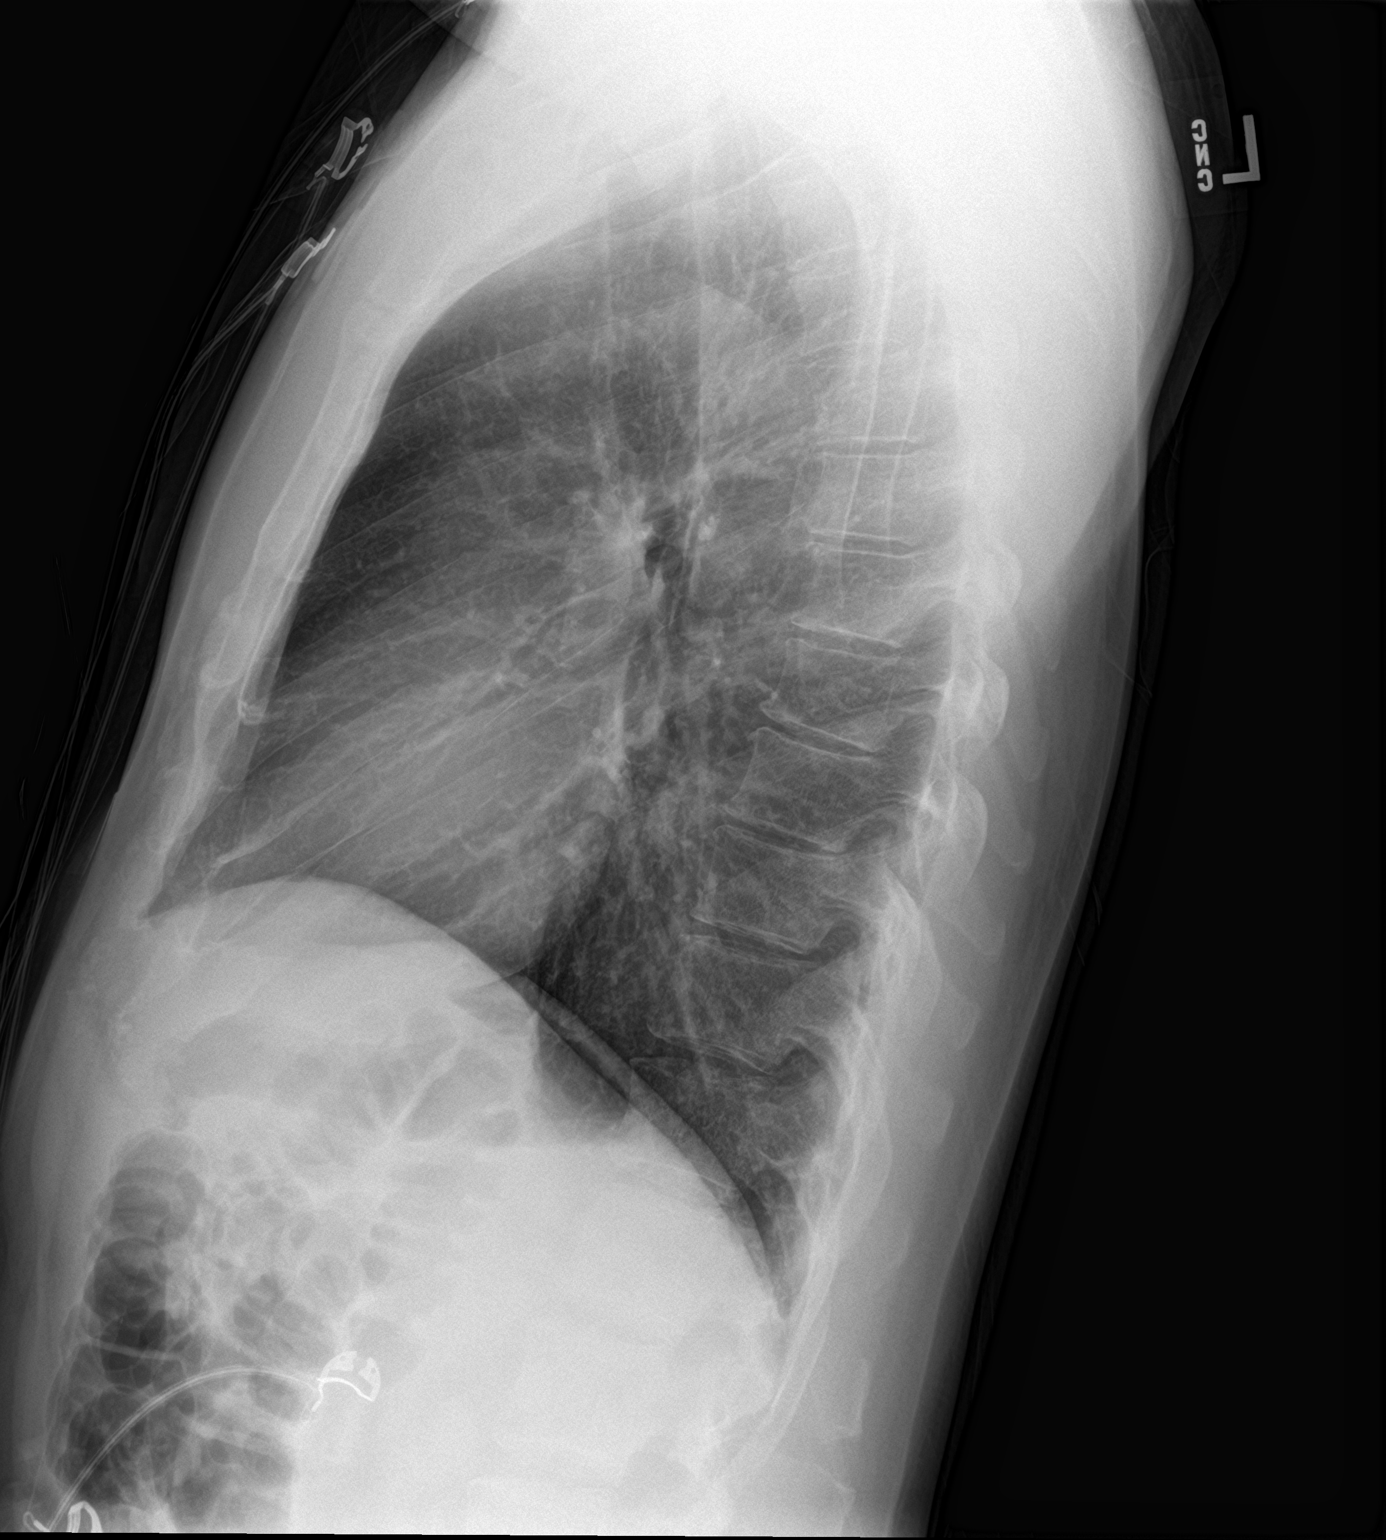

[2 of 2 positions shown; findings below may reference images not displayed]

FINDINGS: The heart size and mediastinal contours are within normal limits.
Both lungs are clear. The visualized skeletal structures are
unremarkable.
IMPRESSION: No active cardiopulmonary disease.

## 2019-06-22 ENCOUNTER — Other Ambulatory Visit: Payer: Self-pay

## 2019-06-22 ENCOUNTER — Encounter: Payer: Self-pay | Admitting: Emergency Medicine

## 2019-06-22 ENCOUNTER — Inpatient Hospital Stay
Admission: EM | Admit: 2019-06-22 | Discharge: 2019-06-28 | DRG: 439 | Disposition: A | Discharge status: Home / Self Care | Payer: Self-pay | Attending: Internal Medicine | Admitting: Internal Medicine

## 2019-06-22 DIAGNOSIS — B1920 Unspecified viral hepatitis C without hepatic coma: Secondary | ICD-10-CM | POA: Diagnosis present

## 2019-06-22 DIAGNOSIS — Z813 Family history of other psychoactive substance abuse and dependence: Secondary | ICD-10-CM

## 2019-06-22 DIAGNOSIS — Z20828 Contact with and (suspected) exposure to other viral communicable diseases: Secondary | ICD-10-CM | POA: Diagnosis present

## 2019-06-22 DIAGNOSIS — D696 Thrombocytopenia, unspecified: Secondary | ICD-10-CM | POA: Diagnosis present

## 2019-06-22 DIAGNOSIS — F1020 Alcohol dependence, uncomplicated: Secondary | ICD-10-CM | POA: Diagnosis present

## 2019-06-22 DIAGNOSIS — I480 Paroxysmal atrial fibrillation: Secondary | ICD-10-CM | POA: Diagnosis present

## 2019-06-22 DIAGNOSIS — M25532 Pain in left wrist: Secondary | ICD-10-CM | POA: Diagnosis not present

## 2019-06-22 DIAGNOSIS — R52 Pain, unspecified: Secondary | ICD-10-CM

## 2019-06-22 DIAGNOSIS — R319 Hematuria, unspecified: Secondary | ICD-10-CM | POA: Diagnosis present

## 2019-06-22 DIAGNOSIS — K859 Acute pancreatitis without necrosis or infection, unspecified: Principal | ICD-10-CM | POA: Diagnosis present

## 2019-06-22 DIAGNOSIS — I248 Other forms of acute ischemic heart disease: Secondary | ICD-10-CM | POA: Diagnosis present

## 2019-06-22 DIAGNOSIS — Z880 Allergy status to penicillin: Secondary | ICD-10-CM

## 2019-06-22 DIAGNOSIS — K311 Adult hypertrophic pyloric stenosis: Secondary | ICD-10-CM | POA: Diagnosis present

## 2019-06-22 DIAGNOSIS — R778 Other specified abnormalities of plasma proteins: Secondary | ICD-10-CM

## 2019-06-22 DIAGNOSIS — Z9114 Patient's other noncompliance with medication regimen: Secondary | ICD-10-CM

## 2019-06-22 DIAGNOSIS — F17210 Nicotine dependence, cigarettes, uncomplicated: Secondary | ICD-10-CM | POA: Diagnosis present

## 2019-06-22 DIAGNOSIS — Z7141 Alcohol abuse counseling and surveillance of alcoholic: Secondary | ICD-10-CM

## 2019-06-22 DIAGNOSIS — R7301 Impaired fasting glucose: Secondary | ICD-10-CM | POA: Diagnosis present

## 2019-06-22 DIAGNOSIS — Z79899 Other long term (current) drug therapy: Secondary | ICD-10-CM

## 2019-06-22 DIAGNOSIS — K2980 Duodenitis without bleeding: Secondary | ICD-10-CM | POA: Diagnosis present

## 2019-06-22 DIAGNOSIS — N400 Enlarged prostate without lower urinary tract symptoms: Secondary | ICD-10-CM | POA: Diagnosis present

## 2019-06-22 DIAGNOSIS — F1910 Other psychoactive substance abuse, uncomplicated: Secondary | ICD-10-CM | POA: Diagnosis present

## 2019-06-22 LAB — URINALYSIS, COMPLETE (UACMP) WITH MICROSCOPIC
Bacteria, UA: NONE SEEN
Bilirubin Urine: NEGATIVE
Glucose, UA: NEGATIVE mg/dL
Hgb urine dipstick: NEGATIVE
Ketones, ur: 20 mg/dL — AB
Leukocytes,Ua: NEGATIVE
Nitrite: NEGATIVE
Protein, ur: 30 mg/dL — AB
Specific Gravity, Urine: 1.018 (ref 1.005–1.030)
pH: 5 (ref 5.0–8.0)

## 2019-06-22 MED ORDER — ONDANSETRON HCL 4 MG/2ML IJ SOLN
4.0000 mg | Freq: Once | INTRAMUSCULAR | Status: AC
  Administered 2019-06-22: 4 mg via INTRAVENOUS
  Filled 2019-06-22: qty 2

## 2019-06-22 MED ORDER — ALUM & MAG HYDROXIDE-SIMETH 200-200-20 MG/5ML PO SUSP
30.0000 mL | Freq: Once | ORAL | Status: AC
  Administered 2019-06-23: 30 mL via ORAL
  Filled 2019-06-22: qty 30

## 2019-06-22 MED ORDER — DICYCLOMINE HCL 10 MG/5ML PO SOLN
10.0000 mg | Freq: Once | ORAL | Status: AC
  Administered 2019-06-23: 10 mg via ORAL
  Filled 2019-06-22: qty 5

## 2019-06-22 MED ORDER — LIDOCAINE VISCOUS HCL 2 % MT SOLN
15.0000 mL | Freq: Once | OROMUCOSAL | Status: AC
  Administered 2019-06-23: 15 mL via ORAL
  Filled 2019-06-22: qty 15

## 2019-06-22 MED ORDER — IOHEXOL 240 MG/ML SOLN
50.0000 mL | Freq: Once | INTRAMUSCULAR | Status: AC
  Administered 2019-06-22: 50 mL via ORAL

## 2019-06-22 MED ORDER — MORPHINE SULFATE (PF) 4 MG/ML IV SOLN
4.0000 mg | Freq: Once | INTRAVENOUS | Status: AC
  Administered 2019-06-22: 4 mg via INTRAVENOUS
  Filled 2019-06-22: qty 1

## 2019-06-22 NOTE — ED Provider Notes (Signed)
[facility] Emergency Department Provider Note   ____________________________________________   First MD Initiated Contact with Patient 06/22/19 2316     (approximate)  I have reviewed the triage vital signs and the nursing notes.   HISTORY  Chief Complaint Abdominal Pain    HPI Bruce [name] is a 48 y.o. male who reports upper abdominal pain onset at 5:00 today.  He has had a bunch of nausea and vomiting with it as well.  Hurts to take a deep breath.  Palpation hurts as well.  Nothing he seems to do makes it better though.  It is moderately severe is been going on constantly since it started.         Past Medical History:  Diagnosis Date   A-fib [facility](HCC)    Dental caries    Hepatitis C infection    Iritis    PAF (paroxysmal atrial fibrillation) (HCC)    a. diagnosed 03/19/18; b. CHADS2VASc 0   Polysubstance abuse (HCC)    a. cocaine, tobacco, etoh   Scrotal mass    Tick borne fever    Uveitis     Patient Active Problem List   Diagnosis Date Noted   A-fib (HCC) 09/13/2018   Atrial fibrillation (HCC) 03/19/2018   Abnormality of gait 02/12/2013   Mandibular fracture (HCC) 12/25/2012   Flu vaccine need 12/25/2012   Scrotal mass 05/22/2012   Dental caries 05/22/2012   Hepatitis C infection 05/16/2012   Myalgia 05/06/2012   Arthralgia 05/06/2012   Tick borne fever, likely 05/05/2012   Tinea cruris, likely 05/05/2012   Spermatocele of epididymis 05/01/2012   Bilateral varicoceles 05/01/2012   Bilateral hydrocele 05/01/2012    Past Surgical History:  Procedure Laterality Date   FINGER SURGERY     infection, middle left   fractured jaw     SHOULDER SURGERY     right    Prior to Admission medications   Medication Sig Start Date End Date Taking? Authorizing Provider  aluminum-magnesium hydroxide-simethicone (MAALOX) 200-200-20 MG/5ML SUSP Take 30 mLs by mouth 4 (four) times daily -  before meals and at  bedtime. 04/03/19   Sharman CheekStafford, Phillip, MD  clotrimazole (MYCELEX) 10 MG troche Take 1 tablet (10 mg total) by mouth 5 (five) times daily. 03/08/19   Sharyn CreamerQuale, Mark, MD  diltiazem (CARDIZEM CD) 180 MG 24 hr capsule Take 1 capsule (180 mg total) by mouth daily. 11/22/18 02/20/19  [name], Ryan M, PA-C  famotidine (PEPCID) 20 MG tablet Take 1 tablet (20 mg total) by mouth 2 (two) times daily. 04/03/19 07/02/19  Sharman CheekStafford, Phillip, MD  flecainide (TAMBOCOR) 50 MG tablet Take 1 tablet (50 mg total) by mouth 2 (two) times daily. 10/16/18 01/20/19  [name], Ryan M, PA-C  ondansetron (ZOFRAN) 4 MG tablet Take 1 tablet (4 mg total) by mouth every 8 (eight) hours as needed for nausea or vomiting. 04/18/19   Jeanmarie PlantMcShane, James A, MD    Allergies Penicillins  Family History  Problem Relation Age of Onset   Drug abuse Father     Social History Social History   Tobacco Use   Smoking status: Current Some Day Smoker    Packs/day: 0.25    Types: Cigarettes   Smokeless tobacco: Never Used   Tobacco comment: Per pt  " I only smoke cigarettes when I drink beer"  Substance Use Topics   Alcohol use: Yes    Comment: 6 pack per week   Drug use: Not Currently    Types: IV, Cocaine  Review of Systems  Constitutional: No fever/chills Eyes: No visual changes. ENT: No sore throat. Cardiovascular: Denies chest pain. Respiratory: Denies shortness of breath. Gastrointestinal: abdominal pain.   nausea,  vomiting.  No diarrhea.  No constipation. Genitourinary: Negative for dysuria. Musculoskeletal: Negative for back pain. Skin: Negative for rash. Neurological: Negative for headaches, focal weakness o  ____________________________________________   PHYSICAL EXAM:  VITAL SIGNS: ED Triage Vitals  Enc Vitals Group     BP 06/22/19 2324 (!) 148/95     Pulse Rate 06/22/19 2324 84     Resp 06/22/19 2324 (!) 26     Temp 06/22/19 2324 98.6 F (37 C)     Temp Source 06/22/19 2324 Oral     SpO2 06/22/19 2324 100 %      Weight 06/22/19 2313 165 lb (74.8 kg)     Height 06/22/19 2313 5\' 7"  (1.702 m)     Head Circumference --      Peak Flow --      Pain Score 06/22/19 2313 10     Pain Loc --      Pain Edu? --      Excl. in [place]? --     Constitutional: Alert and oriented.  Appears to be in pain Eyes: Conjunctivae are normal. Head: Atraumatic. Nose: No congestion/rhinnorhea. Mouth/Throat: Mucous membranes are moist.  Oropharynx non-erythematous. Neck: No stridor.  Cardiovascular: Normal rate, regular rhythm. Grossly normal heart sounds.  Good peripheral circulation. Respiratory: Normal respiratory effort.  No retractions. Lungs CTAB. Gastrointestinal: Soft peers to be tender to palpation in the upper abdomen.. No distention. No abdominal bruits. No CVA tenderness. Musculoskeletal: No lower extremity tenderness nor edema.  Neurologic:  Normal speech and language. No gross focal neurologic deficits are appreciated.  Skin:  Skin is warm, dry and intact. No rash noted.   ____________________________________________   LABS (all labs ordered are listed, but only abnormal results are displayed)  Labs Reviewed  URINALYSIS, COMPLETE (UACMP) WITH MICROSCOPIC - Abnormal; Notable for the following components:      Result Value   Color, Urine YELLOW (*)    APPearance CLEAR (*)    Ketones, ur 20 (*)    Protein, ur 30 (*)    All other components within normal limits  COMPREHENSIVE METABOLIC PANEL - Abnormal; Notable for the following components:   AST 113 (*)    ALT 78 (*)    All other components within normal limits  LIPASE, BLOOD - Abnormal; Notable for the following components:   Lipase 378 (*)    All other components within normal limits  TROPONIN I (HIGH SENSITIVITY) - Abnormal; Notable for the following components:   Troponin I (High Sensitivity) 25 (*)    All other components within normal limits  CBC  TROPONIN I (HIGH SENSITIVITY)   ____________________________________________  EKG  EKG read  interpreted by me shows sinus tach at a rate of 113 normal axis there is a flipped T in lead III and almost biphasic T in aVF which are new from previously. ____________________________________________  RADIOLOGY  ED MD interpretation:   Official radiology report(s): Ct Abdomen Pelvis W Contrast  Result Date: 06/23/2019 CLINICAL DATA:  48 year old male with upper abdominal pain. EXAM: CT ABDOMEN AND PELVIS WITH CONTRAST TECHNIQUE: Multidetector CT imaging of the abdomen and pelvis was performed using the standard protocol following bolus administration of intravenous contrast. CONTRAST:  13mL OMNIPAQUE IOHEXOL 300 MG/ML  SOLN COMPARISON:  CT of the abdomen pelvis dated 05/02/2012 FINDINGS: Lower chest: The visualized  lung bases are clear. No intra-abdominal free air or free fluid. Hepatobiliary: Diffuse fatty infiltration of the liver. No intrahepatic biliary ductal dilatation. The gallbladder is unremarkable. Pancreas: There is diffuse inflammatory changes of the pancreas consistent with acute pancreatitis. Ill-defined focal hypodensity in the uncinate process of the pancreas may represent an area of edema or necrosis. No drainable fluid collection/abscess or pseudocyst identified at this time. Spleen: Normal in size without focal abnormality. Adrenals/Urinary Tract: The adrenal glands are unremarkable. There is no hydronephrosis on either side. There is symmetric enhancement and excretion of contrast by both kidneys. The visualized ureters and urinary bladder appear unremarkable. Stomach/Bowel: The stomach is distended. There is diffuse inflammatory changes and thickening of the proximal duodenum secondary to inflammatory changes of the pancreas. There is probable associated luminal narrowing and resulting in a degree of gastric outlet obstruction and distention of the stomach. The appendix is normal. Vascular/Lymphatic: The abdominal aorta and IVC are unremarkable. The splenic vein, SMV, and main  portal vein are patent. No portal venous gas. No adenopathy. Reproductive: The prostate and seminal vesicles are grossly unremarkable. Bilateral hydroceles partially visualized. Other: None Musculoskeletal: No acute or significant osseous findings. Degenerative changes primarily at L4-L5 with grade 1 anterolisthesis. IMPRESSION: 1. Acute pancreatitis. Ill-defined focal hypodensity in the uncinate process of the pancreas may represent an area of edema or necrosis. No drainable fluid collection/abscess or pseudocyst. 2. Fatty liver. 3. Distended stomach with findings suggestive of degree of gastric outlet obstruction secondary to reactive inflammatory changes of the duodenum. Electronically Signed   By: Elgie CollardArash  Radparvar M.D.   On: 06/23/2019 01:17    ____________________________________________   PROCEDURES  Procedure(s) performed (including Critical Care):  Procedures   ____________________________________________   INITIAL IMPRESSION / ASSESSMENT AND PLAN / ED COURSE  Patient denies having any chest pain or tightness now.  He did have some pain in the left side of his chest earlier.  This is a sharp stabbing pain that comes and goes last for few hours.  This is his chronic daily pain that he has he says.  He is also having a lot of stooling.  This apparently happens frequently he is gone 7 times today.  He cannot tell me if it is diarrhea or not.            ____________________________________________   FINAL CLINICAL IMPRESSION(S) / ED DIAGNOSES  Final diagnoses:  Acute pancreatitis, unspecified complication status, unspecified pancreatitis type  Elevated troponin     ED Discharge Orders    None       Note:  This document was prepared using Dragon voice recognition software and may include unintentional dictation errors.    Arnaldo NatalMalinda, Mckay Tegtmeyer F, MD 06/23/19 Cleophas [id number]

## 2019-06-22 NOTE — ED Notes (Signed)
Pt reports upper abd pain for about 5 hours, worse to the right upper quadrant; pt with history of Hep C; abd tender on palpation; pt says he 's had pain similar to this but not nearly as bad as it is today; pt says pain started after he drank half of a beer and then some water; pt admits to drinking more than usual as he's home so much more during the pandamic

## 2019-06-22 NOTE — ED Notes (Signed)
Pt up to toilet in room to void and collect urine specimen;

## 2019-06-22 NOTE — ED Triage Notes (Signed)
Pt arrives POV to triage with c/o upper left sided abdominal radiating to the right side. Pt states that the pain started at 1700.

## 2019-06-22 NOTE — ED Notes (Signed)
Dr Cinda Quest at bedside to assess

## 2019-06-22 NOTE — ED Notes (Signed)
Geoff from CT in to discuss oral contrast with pt;

## 2019-06-23 ENCOUNTER — Emergency Department: Payer: Self-pay

## 2019-06-23 ENCOUNTER — Other Ambulatory Visit: Payer: Self-pay

## 2019-06-23 ENCOUNTER — Encounter: Payer: Self-pay | Admitting: Radiology

## 2019-06-23 DIAGNOSIS — K311 Adult hypertrophic pyloric stenosis: Secondary | ICD-10-CM

## 2019-06-23 DIAGNOSIS — R7989 Other specified abnormal findings of blood chemistry: Secondary | ICD-10-CM

## 2019-06-23 DIAGNOSIS — K859 Acute pancreatitis without necrosis or infection, unspecified: Secondary | ICD-10-CM | POA: Diagnosis present

## 2019-06-23 LAB — COMPREHENSIVE METABOLIC PANEL
ALT: 78 U/L — ABNORMAL HIGH (ref 0–44)
AST: 113 U/L — ABNORMAL HIGH (ref 15–41)
Albumin: 4.2 g/dL (ref 3.5–5.0)
Alkaline Phosphatase: 94 U/L (ref 38–126)
Anion gap: 11 (ref 5–15)
BUN: 11 mg/dL (ref 6–20)
CO2: 23 mmol/L (ref 22–32)
Calcium: 9.3 mg/dL (ref 8.9–10.3)
Chloride: 103 mmol/L (ref 98–111)
Creatinine, Ser: 0.7 mg/dL (ref 0.61–1.24)
GFR calc Af Amer: >60 mL/min (ref >60)
GFR calc non Af Amer: >60 mL/min (ref >60)
Glucose, Bld: 92 mg/dL (ref 70–99)
Potassium: 3.6 mmol/L (ref 3.5–5.1)
Sodium: 137 mmol/L (ref 135–145)
Total Bilirubin: 1.2 mg/dL (ref 0.3–1.2)
Total Protein: 8 g/dL (ref 6.5–8.1)

## 2019-06-23 LAB — URINE DRUG SCREEN, QUALITATIVE (ARMC ONLY)
Amphetamines, Ur Screen: NOT DETECTED
Barbiturates, Ur Screen: NOT DETECTED
Benzodiazepine, Ur Scrn: NOT DETECTED
Cannabinoid 50 Ng, Ur ~~LOC~~: NOT DETECTED
Cocaine Metabolite,Ur ~~LOC~~: POSITIVE — AB
MDMA (Ecstasy)Ur Screen: NOT DETECTED
Methadone Scn, Ur: NOT DETECTED
Opiate, Ur Screen: POSITIVE — AB
Phencyclidine (PCP) Ur S: NOT DETECTED
Tricyclic, Ur Screen: NOT DETECTED

## 2019-06-23 LAB — SARS CORONAVIRUS 2 BY RT PCR (HOSPITAL ORDER, PERFORMED IN ~~LOC~~ HOSPITAL LAB): SARS Coronavirus 2: NEGATIVE

## 2019-06-23 LAB — BASIC METABOLIC PANEL
Anion gap: 12 (ref 5–15)
BUN: 9 mg/dL (ref 6–20)
CO2: 22 mmol/L (ref 22–32)
Calcium: 8.8 mg/dL — ABNORMAL LOW (ref 8.9–10.3)
Chloride: 105 mmol/L (ref 98–111)
Creatinine, Ser: 0.72 mg/dL (ref 0.61–1.24)
GFR calc Af Amer: >60 mL/min (ref >60)
GFR calc non Af Amer: >60 mL/min (ref >60)
Glucose, Bld: 78 mg/dL (ref 70–99)
Potassium: 3.8 mmol/L (ref 3.5–5.1)
Sodium: 139 mmol/L (ref 135–145)

## 2019-06-23 LAB — CBC
HCT: 45.6 % (ref 39.0–52.0)
HCT: 46 % (ref 39.0–52.0)
Hemoglobin: 15.8 g/dL (ref 13.0–17.0)
Hemoglobin: 16.3 g/dL (ref 13.0–17.0)
MCH: 30.9 pg (ref 26.0–34.0)
MCH: 31 pg (ref 26.0–34.0)
MCHC: 34.6 g/dL (ref 30.0–36.0)
MCHC: 35.4 g/dL (ref 30.0–36.0)
MCV: 87.1 fL (ref 80.0–100.0)
MCV: 89.4 fL (ref 80.0–100.0)
Platelets: 147 10*3/uL — ABNORMAL LOW (ref 150–400)
Platelets: 153 10*3/uL (ref 150–400)
RBC: 5.1 MIL/uL (ref 4.22–5.81)
RBC: 5.28 MIL/uL (ref 4.22–5.81)
RDW: 13 % (ref 11.5–15.5)
RDW: 13.1 % (ref 11.5–15.5)
WBC: 4.3 10*3/uL (ref 4.0–10.5)
WBC: 5 10*3/uL (ref 4.0–10.5)
nRBC: 0 % (ref 0.0–0.2)
nRBC: 0 % (ref 0.0–0.2)

## 2019-06-23 LAB — TROPONIN I (HIGH SENSITIVITY)
Troponin I (High Sensitivity): 22 ng/L — ABNORMAL HIGH (ref <18)
Troponin I (High Sensitivity): 24 ng/L — ABNORMAL HIGH (ref <18)
Troponin I (High Sensitivity): 25 ng/L — ABNORMAL HIGH (ref <18)

## 2019-06-23 LAB — LIPASE, BLOOD: Lipase: 378 U/L — ABNORMAL HIGH (ref 11–51)

## 2019-06-23 LAB — HEMOGLOBIN A1C
Hgb A1c MFr Bld: 5.6 % (ref 4.8–5.6)
Mean Plasma Glucose: 114.02 mg/dL

## 2019-06-23 MED ORDER — PANTOPRAZOLE SODIUM 40 MG IV SOLR
40.0000 mg | Freq: Two times a day (BID) | INTRAVENOUS | Status: DC
  Administered 2019-06-23 – 2019-06-25 (×6): 40 mg via INTRAVENOUS
  Filled 2019-06-23 (×6): qty 40

## 2019-06-23 MED ORDER — ONDANSETRON HCL 4 MG PO TABS
4.0000 mg | ORAL_TABLET | Freq: Four times a day (QID) | ORAL | Status: DC | PRN
  Administered 2019-06-24: 4 mg via ORAL
  Filled 2019-06-23: qty 1

## 2019-06-23 MED ORDER — KETOROLAC TROMETHAMINE 15 MG/ML IJ SOLN
15.0000 mg | Freq: Four times a day (QID) | INTRAMUSCULAR | Status: DC | PRN
  Administered 2019-06-24 – 2019-06-25 (×2): 15 mg via INTRAVENOUS
  Filled 2019-06-23 (×3): qty 1

## 2019-06-23 MED ORDER — FLECAINIDE ACETATE 50 MG PO TABS
50.0000 mg | ORAL_TABLET | Freq: Two times a day (BID) | ORAL | Status: DC
  Filled 2019-06-23 (×2): qty 1

## 2019-06-23 MED ORDER — SODIUM CHLORIDE 0.9 % IV BOLUS
1000.0000 mL | Freq: Once | INTRAVENOUS | Status: AC
  Administered 2019-06-23: 02:00:00 1000 mL via INTRAVENOUS

## 2019-06-23 MED ORDER — MORPHINE SULFATE (PF) 2 MG/ML IV SOLN
2.0000 mg | INTRAVENOUS | Status: DC | PRN
  Administered 2019-06-23 – 2019-06-28 (×29): 2 mg via INTRAVENOUS
  Filled 2019-06-23 (×30): qty 1

## 2019-06-23 MED ORDER — ADULT MULTIVITAMIN W/MINERALS CH
1.0000 | ORAL_TABLET | Freq: Every day | ORAL | Status: DC
  Administered 2019-06-23 – 2019-06-28 (×6): 1 via ORAL
  Filled 2019-06-23 (×6): qty 1

## 2019-06-23 MED ORDER — FOLIC ACID 5 MG/ML IJ SOLN: 1.0000 mg | Freq: Every day | INTRAMUSCULAR | Status: DC

## 2019-06-23 MED ORDER — HYDROMORPHONE HCL 1 MG/ML IJ SOLN
0.5000 mg | Freq: Once | INTRAMUSCULAR | Status: AC
  Administered 2019-06-23: 0.5 mg via INTRAVENOUS
  Filled 2019-06-23: qty 1

## 2019-06-23 MED ORDER — THIAMINE HCL 100 MG/ML IJ SOLN
100.0000 mg | Freq: Every day | INTRAMUSCULAR | Status: DC
  Filled 2019-06-23: qty 2

## 2019-06-23 MED ORDER — SODIUM CHLORIDE 0.9% FLUSH: 3.0000 mL | INTRAVENOUS | Status: DC | PRN

## 2019-06-23 MED ORDER — DIPHENHYDRAMINE HCL 50 MG/ML IJ SOLN
25.0000 mg | Freq: Four times a day (QID) | INTRAMUSCULAR | Status: DC | PRN
  Administered 2019-06-23 – 2019-06-24 (×4): 25 mg via INTRAVENOUS
  Filled 2019-06-23 (×4): qty 1

## 2019-06-23 MED ORDER — METRONIDAZOLE IN NACL 5-0.79 MG/ML-% IV SOLN
500.0000 mg | Freq: Three times a day (TID) | INTRAVENOUS | Status: DC
  Administered 2019-06-23 – 2019-06-26 (×9): 500 mg via INTRAVENOUS
  Filled 2019-06-23 (×12): qty 100

## 2019-06-23 MED ORDER — LORAZEPAM 2 MG/ML IJ SOLN: 1.0000 mg | Freq: Four times a day (QID) | INTRAMUSCULAR | Status: AC | PRN

## 2019-06-23 MED ORDER — IOHEXOL 300 MG/ML  SOLN
100.0000 mL | Freq: Once | INTRAMUSCULAR | Status: AC | PRN
  Administered 2019-06-23: 100 mL via INTRAVENOUS

## 2019-06-23 MED ORDER — ONDANSETRON HCL 4 MG/2ML IJ SOLN
4.0000 mg | Freq: Once | INTRAMUSCULAR | Status: AC
  Administered 2019-06-23: 4 mg via INTRAVENOUS
  Filled 2019-06-23: qty 2

## 2019-06-23 MED ORDER — MORPHINE SULFATE (PF) 2 MG/ML IV SOLN
2.0000 mg | INTRAVENOUS | Status: DC | PRN
  Administered 2019-06-23: 2 mg via INTRAVENOUS
  Filled 2019-06-23: qty 1

## 2019-06-23 MED ORDER — ONDANSETRON HCL 4 MG/2ML IJ SOLN
4.0000 mg | Freq: Four times a day (QID) | INTRAMUSCULAR | Status: DC | PRN
  Administered 2019-06-23 – 2019-06-25 (×3): 4 mg via INTRAVENOUS
  Filled 2019-06-23 (×3): qty 2

## 2019-06-23 MED ORDER — ENOXAPARIN SODIUM 40 MG/0.4ML ~~LOC~~ SOLN
40.0000 mg | SUBCUTANEOUS | Status: DC
  Administered 2019-06-23 – 2019-06-24 (×2): 40 mg via SUBCUTANEOUS
  Filled 2019-06-23 (×3): qty 0.4

## 2019-06-23 MED ORDER — SODIUM CHLORIDE 0.9% FLUSH
3.0000 mL | Freq: Two times a day (BID) | INTRAVENOUS | Status: DC
  Administered 2019-06-23 – 2019-06-28 (×6): 3 mL via INTRAVENOUS

## 2019-06-23 MED ORDER — FOLIC ACID 1 MG PO TABS
1.0000 mg | ORAL_TABLET | Freq: Every day | ORAL | Status: DC
  Administered 2019-06-23 – 2019-06-28 (×6): 1 mg via ORAL
  Filled 2019-06-23 (×6): qty 1

## 2019-06-23 MED ORDER — SODIUM CHLORIDE 0.9 % IV SOLN
2.0000 g | Freq: Three times a day (TID) | INTRAVENOUS | Status: DC
  Administered 2019-06-23 – 2019-06-25 (×7): 2 g via INTRAVENOUS
  Filled 2019-06-23 (×11): qty 2

## 2019-06-23 MED ORDER — LORAZEPAM 1 MG PO TABS
1.0000 mg | ORAL_TABLET | Freq: Four times a day (QID) | ORAL | Status: AC | PRN
  Administered 2019-06-23 – 2019-06-25 (×4): 1 mg via ORAL
  Filled 2019-06-23 (×5): qty 1

## 2019-06-23 MED ORDER — DILTIAZEM HCL ER COATED BEADS 180 MG PO CP24: 180.0000 mg | ORAL_CAPSULE | Freq: Every day | ORAL | Status: DC

## 2019-06-23 MED ORDER — VITAMIN B-1 100 MG PO TABS
100.0000 mg | ORAL_TABLET | Freq: Every day | ORAL | Status: DC
  Administered 2019-06-23 – 2019-06-28 (×6): 100 mg via ORAL
  Filled 2019-06-23 (×6): qty 1

## 2019-06-23 MED ORDER — SODIUM CHLORIDE 0.9 % IV SOLN
250.0000 mL | INTRAVENOUS | Status: DC | PRN
  Administered 2019-06-24: 50 mL via INTRAVENOUS

## 2019-06-23 MED ORDER — SODIUM CHLORIDE 0.9 % IV SOLN
INTRAVENOUS | Status: DC
  Administered 2019-06-23 – 2019-06-24 (×4): via INTRAVENOUS

## 2019-06-23 MED ORDER — THIAMINE HCL 100 MG/ML IJ SOLN: 100.0000 mg | Freq: Every day | INTRAMUSCULAR | Status: DC

## 2019-06-23 NOTE — Consult Note (Signed)
Cardiology Consultation:   [name] MRN: [id number]; DOB: 1971-10-21  Admit date: 06/22/2019 Date of Consult: 06/23/2019  Primary Care Provider: Patient, No Pcp Per Primary Cardiologist: No primary care provider on file.  Primary Electrophysiologist:  None    Patient Profile:   Bruce [name] is a 48 y.o. male with a hx of paroxysmal atrial fibrillation who is being seen today for the evaluation of elevated troponin at the request of Dr Hilton Sinclair.  History of Present Illness:   Bruce [name] is a 48 year old male with history of paroxysmal atrial fibrillation, presenting to the hospital with abdominal pain, diagnosed with acute pancreatitis.  The patient has a longstanding history of polysubstance abuse and alcoholism.  He presents with abdominal pain, nausea, vomiting, and diarrhea.  He has had some pain radiating into the left chest.  He complains of shortness of breath.  A CT scan done in the emergency room showed evidence of acute pancreatitis and he is admitted to the hospitalist service.  Cardiology consultation is obtained because of elevated troponin.  At the time of my evaluation, the patient's wife is at the bedside.  He continues to complain of epigastric pain.  He has not been taking any of his cardiac medicines on a regular basis.  Heart Pathway Score:     Past Medical History:  Diagnosis Date   A-fib [facility])    Dental caries    Hepatitis C infection    Iritis    PAF (paroxysmal atrial fibrillation) (HCC)    a. diagnosed 03/19/18; b. CHADS2VASc 0   Polysubstance abuse (HCC)    a. cocaine, tobacco, etoh   Scrotal mass    Tick borne fever    Uveitis     Past Surgical History:  Procedure Laterality Date   FINGER SURGERY     infection, middle left   fractured jaw     SHOULDER SURGERY     right     Home Medications:  Prior to Admission medications   Medication Sig Start Date End Date Taking? Authorizing Provider  flecainide (TAMBOCOR) 50 MG  tablet Take 1 tablet (50 mg total) by mouth 2 (two) times daily. 10/16/18 06/23/19 Yes Dunn, Raymon Mutton, PA-C  aluminum-magnesium hydroxide-simethicone (MAALOX) 200-200-20 MG/5ML SUSP Take 30 mLs by mouth 4 (four) times daily -  before meals and at bedtime. Patient not taking: Reported on 06/23/2019 04/03/19   Sharman Cheek, MD  clotrimazole [facility]) 10 MG troche Take 1 tablet (10 mg total) by mouth 5 (five) times daily. Patient not taking: Reported on 06/23/2019 03/08/19   Sharyn Creamer, MD  diltiazem (CARDIZEM CD) 180 MG 24 hr capsule Take 1 capsule (180 mg total) by mouth daily. Patient not taking: Reported on 06/23/2019 11/22/18 06/23/19  Sondra Barges, PA-C  famotidine (PEPCID) 20 MG tablet Take 1 tablet (20 mg total) by mouth 2 (two) times daily. Patient not taking: Reported on 06/23/2019 04/03/19 07/02/19  Sharman Cheek, MD    Inpatient Medications: Scheduled Meds:  enoxaparin (LOVENOX) injection  40 mg Subcutaneous Q24H   flecainide  50 mg Oral Q12H   folic acid  1 mg Oral Daily   multivitamin with minerals  1 tablet Oral Daily   pantoprazole (PROTONIX) IV  40 mg Intravenous Q12H   sodium chloride flush  3 mL Intravenous Q12H   thiamine  100 mg Oral Daily   Or   thiamine  100 mg Intravenous Daily   Continuous Infusions:  sodium chloride 100 mL/hr at 06/23/19 0416  sodium chloride     PRN Meds: sodium chloride, diphenhydrAMINE, ketorolac, LORazepam **OR** LORazepam, morphine injection, ondansetron **OR** ondansetron (ZOFRAN) IV, sodium chloride flush  Allergies:    Allergies  Allergen Reactions   Penicillins Other (See Comments)    Childhood reaction    Social History:   Social History   Socioeconomic History   Marital status: Single    Spouse name: Not on file   Number of children: 0   Years of education: Not on file   Highest education level: Not on file  Occupational History    Employer: UNEMPLOYE  Social Needs   Financial resource strain: Not hard at  all   Food insecurity    Worry: Never true    Inability: Never true   Transportation needs    Medical: No    Non-medical: Yes  Tobacco Use   Smoking status: Current Some Day Smoker    Packs/day: 0.25    Types: Cigarettes   Smokeless tobacco: Never Used   Tobacco comment: Per pt  " I only smoke cigarettes when I drink beer"  Substance and Sexual Activity   Alcohol use: Yes    Comment: 6 pack per week   Drug use: Yes    Types: IV, Cocaine    Comment: reports last time he used cocaine was thursday    Sexual activity: Not on file  Lifestyle   Physical activity    Days per week: 7 days    Minutes per session: 30 min   Stress: To some extent  Relationships   Social connections    Talks on phone: More than three times a week    Gets together: More than three times a week    Attends religious service: More than 4 times per year    Active member of club or organization: No    Attends meetings of clubs or organizations: Never    Relationship status: Living with partner   Intimate partner violence    Fear of current or ex partner: No    Emotionally abused: No    Physically abused: No    Forced sexual activity: No  Other Topics Concern   Not on file  Social History Narrative   Not on file    Family History:   Family History  Problem Relation Age of Onset   Drug abuse Father      ROS:  Please see the history of present illness.  All other ROS reviewed and negative.     Physical Exam/Data:   Vitals:   06/23/19 0530 06/23/19 0600 06/23/19 0645 06/23/19 0711  BP: 134/84 121/71 (!) 153/85 (!) 143/102  Pulse: 80 61 62 67  Resp: (!) 31 15 20 19   Temp:   98.6 F (37 C)   TempSrc:   Oral   SpO2: 96% 97% 99% 100%  Weight:      Height:        Intake/Output Summary (Last 24 hours) at 06/23/2019 1129 Last data filed at 06/23/2019 0348 Gross per 24 hour  Intake 1000 ml  Output --  Net 1000 ml   Last 3 Weights 06/22/2019 04/18/2019 04/03/2019  Weight (lbs)  165 lb 170 lb 172 lb  Weight (kg) 74.844 kg 77.111 kg 78.019 kg     Body mass index is 25.84 kg/m.  General: Somewhat ill-appearing male, in no acute distress HEENT: normal Lymph: no adenopathy Neck: no JVD Endocrine:  No thryomegaly Vascular: No carotid bruits; FA pulses 2+ bilaterally  Cardiac:  normal S1, S2; RRR; no murmur  Lungs:  clear to auscultation bilaterally, no wheezing, rhonchi or rales  Abd: soft, diffuse tenderness without rebound or guarding, no hepatomegaly  Ext: no edema Musculoskeletal:  No deformities, BUE and BLE strength normal and equal Skin: warm and dry  Neuro:  CNs 2-12 intact, no focal abnormalities noted Psych:  Normal affect   EKG:  The EKG was personally reviewed and demonstrates: Normal sinus rhythm 84 bpm, left atrial enlargement, no acute ST or T wave changes   Telemetry:  Telemetry was personally reviewed and demonstrates: Normal sinus rhythm with occasional PVCs  Relevant CV Studies: 2D echocardiogram pending, echocardiogram 2019 showed normal LV systolic function  Laboratory Data:  High Sensitivity Troponin:   Recent Labs  Lab 06/22/19 2359 06/23/19 0204 06/23/19 0524  TROPONINIHS 25* 24* 22*     Cardiac EnzymesNo results for input(s): TROPONINI in the last 168 hours. No results for input(s): TROPIPOC in the last 168 hours.  Chemistry Recent Labs  Lab 06/22/19 2359 06/23/19 0524  NA 137 139  K 3.6 3.8  CL 103 105  CO2 23 22  GLUCOSE 92 78  BUN 11 9  CREATININE 0.70 0.72  CALCIUM 9.3 8.8*  GFRNONAA >60 >60  GFRAA >60 >60  ANIONGAP 11 12    Recent Labs  Lab 06/22/19 2359  PROT 8.0  ALBUMIN 4.2  AST 113*  ALT 78*  ALKPHOS 94  BILITOT 1.2   Hematology Recent Labs  Lab 06/22/19 2359 06/23/19 0524  WBC 4.3 5.0  RBC 5.28 5.10  HGB 16.3 15.8  HCT 46.0 45.6  MCV 87.1 89.4  MCH 30.9 31.0  MCHC 35.4 34.6  RDW 13.1 13.0  PLT 153 147*   BNPNo results for input(s): BNP, PROBNP in the last 168 hours.  DDimer No  results for input(s): DDIMER in the last 168 hours.   Radiology/Studies:  Ct Abdomen Pelvis W Contrast  Result Date: 06/23/2019 CLINICAL DATA:  48 year old male with upper abdominal pain. EXAM: CT ABDOMEN AND PELVIS WITH CONTRAST TECHNIQUE: Multidetector CT imaging of the abdomen and pelvis was performed using the standard protocol following bolus administration of intravenous contrast. CONTRAST:  OMNIPAQUE IOHEXOL 300 MG/ML  SOLN COMPARISON:  CT of the abdomen pelvis dated 05/02/2012 FINDINGS: Lower chest: The visualized lung bases are clear. No intra-abdominal free air or free fluid. Hepatobiliary: Diffuse fatty infiltration of the liver. No intrahepatic biliary ductal dilatation. The gallbladder is unremarkable. Pancreas: There is diffuse inflammatory changes of the pancreas consistent with acute pancreatitis. Ill-defined focal hypodensity in the uncinate process of the pancreas may represent an area of edema or necrosis. No drainable fluid collection/abscess or pseudocyst identified at this time. Spleen: Normal in size without focal abnormality. Adrenals/Urinary Tract: The adrenal glands are unremarkable. There is no hydronephrosis on either side. There is symmetric enhancement and excretion of contrast by both kidneys. The visualized ureters and urinary bladder appear unremarkable. Stomach/Bowel: The stomach is distended. There is diffuse inflammatory changes and thickening of the proximal duodenum secondary to inflammatory changes of the pancreas. There is probable associated luminal narrowing and resulting in a degree of gastric outlet obstruction and distention of the stomach. The appendix is normal. Vascular/Lymphatic: The abdominal aorta and IVC are unremarkable. The splenic vein, SMV, and main portal vein are patent. No portal venous gas. No adenopathy. Reproductive: The prostate and seminal vesicles are grossly unremarkable. Bilateral hydroceles partially visualized. Other: None  Musculoskeletal: No acute or significant osseous findings. Degenerative changes primarily at  L4-L5 with grade 1 anterolisthesis. IMPRESSION: 1. Acute pancreatitis. Ill-defined focal hypodensity in the uncinate process of the pancreas may represent an area of edema or necrosis. No drainable fluid collection/abscess or pseudocyst. 2. Fatty liver. 3. Distended stomach with findings suggestive of degree of gastric outlet obstruction secondary to reactive inflammatory changes of the duodenum. Electronically Signed   By: Elgie Collard M.D.   On: 06/23/2019 01:17    Assessment and Plan:   1. Minimal troponin elevation: Likely related to acute pancreatitis.  No evidence of acute coronary syndrome.  EKG shows no ST or T wave changes and his symptoms are not consistent with ACS.  There is a low level flat trend of troponin elevation.  I would not recommend any further cardiac evaluation at this point. 2. Paroxysmal atrial fibrillation: The patient has been noncompliant with his medicines.  He is really not a good candidate for antiarrhythmic drug therapy.  He does not require anticoagulation with a chads 2 Vasc of 0, nor is he a good candidate for anticoagulation.  I would not recommend restarting flecainide at this point.   3. Acute pancreatitis: Management per hospitalist team  We will follow-up after the patient's echocardiogram is completed to make sure there are no major abnormalities.  Otherwise there do not appear to be any acute cardiac issues at this point.   For questions or updates, please contact [facility] Please consult [email or web address] for contact info under     Signed, Tonny Bollman, MD  06/23/2019 11:29 AM

## 2019-06-23 NOTE — ED Notes (Signed)
Attempt to call report, not able to at this time due to possible floor reassignment.

## 2019-06-23 NOTE — ED Notes (Signed)
Blood redrawn and sent to lab

## 2019-06-23 NOTE — ED Notes (Signed)
Pt back from CT

## 2019-06-23 NOTE — ED Notes (Signed)
Patient advised to hold oral contrast for 10-15 minutes after drinking Maalox/Lidocaine.

## 2019-06-23 NOTE — Progress Notes (Signed)
[name], male   DOB: 02-21-1971, 48 y.o.   MRN: [id number]  [employer] PROGRESS NOTE  Bruce [name] GEX:[id number] DOB: [birth year] DOA: 06/22/2019 PCP: Patient, No Pcp Per  HPI/Subjective: Patient started developing chest pain pain around 5 PM last night.  It worsened around 10 PM and he could not take it anymore.  He had some nausea and vomiting.  He said he had increased urination and some bowel movements.  In the ER he was found to have an elevated lipase and CAT scan showing acute pancreatitis.  Objective: Vitals:   06/23/19 0645 06/23/19 0711  BP: (!) 153/85 (!) 143/102  Pulse: 62 67  Resp: 20 19  Temp: 98.6 F (37 C)   SpO2: 99% 100%    Filed Weights   06/22/19 2313  Weight: 74.8 kg    ROS: Review of Systems  Constitutional: Negative for chills and fever.  Eyes: Negative for blurred vision.  Respiratory: Negative for cough and shortness of breath.   Cardiovascular: Positive for chest pain.  Gastrointestinal: Positive for abdominal pain, nausea and vomiting. Negative for constipation and diarrhea.  Genitourinary: Negative for dysuria.  Musculoskeletal: Negative for joint pain.  Neurological: Negative for dizziness and headaches.   Exam: Physical Exam  Constitutional: He is oriented to person, place, and time.  HENT:  Nose: No mucosal edema.  Mouth/Throat: No oropharyngeal exudate or posterior oropharyngeal edema.  Eyes: Pupils are equal, round, and reactive to light. Conjunctivae, EOM and lids are normal.  Neck: No JVD present. Carotid bruit is not present. No edema present. No thyroid mass and no thyromegaly present.  Cardiovascular: S1 normal and S2 normal. Exam reveals no gallop.  No murmur heard. Pulses:      Dorsalis pedis pulses are 2+ on the right side and 2+ on the left side.  Respiratory: No respiratory distress. He has no wheezes. He has no rhonchi. He has no rales.  GI: Soft. Bowel sounds are normal. There is abdominal tenderness in  the right upper quadrant, epigastric area and left upper quadrant.  Musculoskeletal:     Right ankle: He exhibits no swelling.     Left ankle: He exhibits no swelling.  Lymphadenopathy:    He has no cervical adenopathy.  Neurological: He is alert and oriented to person, place, and time. No cranial nerve deficit.  Skin: Skin is warm. No rash noted. Nails show no clubbing.  Psychiatric: He has a normal mood and affect.      Data Reviewed: Basic Metabolic Panel: Recent Labs  Lab 06/22/19 2359 06/23/19 0524  NA 137 139  K 3.6 3.8  CL 103 105  CO2 23 22  GLUCOSE 92 78  BUN 11 9  CREATININE 0.70 0.72  CALCIUM 9.3 8.8*   Liver Function Tests: Recent Labs  Lab 06/22/19 2359  AST 113*  ALT 78*  ALKPHOS 94  BILITOT 1.2  PROT 8.0  ALBUMIN 4.2   Recent Labs  Lab 06/22/19 2359  LIPASE 378*   CBC: Recent Labs  Lab 06/22/19 2359 06/23/19 0524  WBC 4.3 5.0  HGB 16.3 15.8  HCT 46.0 45.6  MCV 87.1 89.4  PLT 153 147*     Recent Results (from the past 240 hour(s))  SARS Coronavirus 2 [facility] order, Performed in [facility] hospital lab) Nasopharyngeal Nasopharyngeal Swab     Status: None   Collection Time: 06/23/19  3:45 AM   Specimen: Nasopharyngeal Swab  Result Value Ref Range Status   SARS Coronavirus 2 NEGATIVE  NEGATIVE Final    Comment: (NOTE) If result is NEGATIVE SARS-CoV-2 target nucleic acids are NOT DETECTED. The SARS-CoV-2 RNA is generally detectable in upper and lower  respiratory specimens during the acute phase of infection. The lowest  concentration of SARS-CoV-2 viral copies this assay can detect is 250  copies / mL. A negative result does not preclude SARS-CoV-2 infection  and should not be used as the sole basis for treatment or other  patient management decisions.  A negative result may occur with  improper specimen collection / handling, submission of specimen other  than nasopharyngeal swab, presence of viral mutation(s) within the  areas  targeted by this assay, and inadequate number of viral copies  (<250 copies / mL). A negative result must be combined with clinical  observations, patient history, and epidemiological information. If result is POSITIVE SARS-CoV-2 target nucleic acids are DETECTED. The SARS-CoV-2 RNA is generally detectable in upper and lower  respiratory specimens dur ing the acute phase of infection.  Positive  results are indicative of active infection with SARS-CoV-2.  Clinical  correlation with patient history and other diagnostic information is  necessary to determine patient infection status.  Positive results do  not rule out bacterial infection or co-infection with other viruses. If result is PRESUMPTIVE POSTIVE SARS-CoV-2 nucleic acids MAY BE PRESENT.   A presumptive positive result was obtained on the submitted specimen  and confirmed on repeat testing.  While 2019 novel coronavirus  (SARS-CoV-2) nucleic acids may be present in the submitted sample  additional confirmatory testing may be necessary for epidemiological  and / or clinical management purposes  to differentiate between  SARS-CoV-2 and other Sarbecovirus currently known to infect humans.  If clinically indicated additional testing with an alternate test  methodology [phone]) is advised. The SARS-CoV-2 RNA is generally  detectable in upper and lower respiratory sp ecimens during the acute  phase of infection. The expected result is Negative. Fact Sheet for Patients:  BoilerBrush.com.cy Fact Sheet for Healthcare Providers: https://pope.com/ This test is not yet approved or cleared by the [place] FDA and has been authorized for detection and/or diagnosis of SARS-CoV-2 by FDA under an Emergency Use Authorization (EUA).  This EUA will remain in effect (meaning this test can be used) for the duration of the COVID-19 declaration under Section 564(b)(1) of the Act, 21 U.S.C. section  360bbb-3(b)(1), unless the authorization is terminated or revoked sooner. Performed at [facility], [address], [place], [place] [address]      Studies: Ct Abdomen Pelvis W Contrast  Result Date: 06/23/2019 CLINICAL DATA:  48 year old male with upper abdominal pain. EXAM: CT ABDOMEN AND PELVIS WITH CONTRAST TECHNIQUE: Multidetector CT imaging of the abdomen and pelvis was performed using the standard protocol following bolus administration of intravenous contrast. CONTRAST:  OMNIPAQUE IOHEXOL 300 MG/ML  SOLN COMPARISON:  CT of the abdomen pelvis dated 05/02/2012 FINDINGS: Lower chest: The visualized lung bases are clear. No intra-abdominal free air or free fluid. Hepatobiliary: Diffuse fatty infiltration of the liver. No intrahepatic biliary ductal dilatation. The gallbladder is unremarkable. Pancreas: There is diffuse inflammatory changes of the pancreas consistent with acute pancreatitis. Ill-defined focal hypodensity in the uncinate process of the pancreas may represent an area of edema or necrosis. No drainable fluid collection/abscess or pseudocyst identified at this time. Spleen: Normal in size without focal abnormality. Adrenals/Urinary Tract: The adrenal glands are unremarkable. There is no hydronephrosis on either side. There is symmetric enhancement and excretion of contrast by both  kidneys. The visualized ureters and urinary bladder appear unremarkable. Stomach/Bowel: The stomach is distended. There is diffuse inflammatory changes and thickening of the proximal duodenum secondary to inflammatory changes of the pancreas. There is probable associated luminal narrowing and resulting in a degree of gastric outlet obstruction and distention of the stomach. The appendix is normal. Vascular/Lymphatic: The abdominal aorta and IVC are unremarkable. The splenic vein, SMV, and main portal vein are patent. No portal venous gas. No adenopathy. Reproductive: The prostate and seminal  vesicles are grossly unremarkable. Bilateral hydroceles partially visualized. Other: None Musculoskeletal: No acute or significant osseous findings. Degenerative changes primarily at L4-L5 with grade 1 anterolisthesis. IMPRESSION: 1. Acute pancreatitis. Ill-defined focal hypodensity in the uncinate process of the pancreas may represent an area of edema or necrosis. No drainable fluid collection/abscess or pseudocyst. 2. Fatty liver. 3. Distended stomach with findings suggestive of degree of gastric outlet obstruction secondary to reactive inflammatory changes of the duodenum. Electronically Signed   By: Elgie Collard M.D.   On: 06/23/2019 01:17    Scheduled Meds: . enoxaparin (LOVENOX) injection  40 mg Subcutaneous Q24H  . folic acid  1 mg Oral Daily  . multivitamin with minerals  1 tablet Oral Daily  . pantoprazole (PROTONIX) IV  40 mg Intravenous Q12H  . sodium chloride flush  3 mL Intravenous Q12H  . thiamine  100 mg Oral Daily   Or  . thiamine  100 mg Intravenous Daily   Continuous Infusions: . sodium chloride 100 mL/hr at 06/23/19 0416  . sodium chloride      Assessment/Plan:  1. Acute pancreatitis likely severe with edema and or necrosis seen on CT scan.  Continue vigorous IV fluid hydration.  Continue to monitor vitals.  Add empiric antibiotic.  PRN pain and nausea medications 2. Duodenal inflammation.  Give IV Protonix.  GI consultation. 3. Alcohol abuse.  Patient put on alcohol withdrawal protocol. 4. Paroxysmal atrial fibrillation.  Seen by cardiology.  Not a good candidate for anticoagulation or antiarrhythmics.  Medication stopped by cardiology. 5. Impaired fasting glucose.  Patient not a diabetic hemoglobin A1c 5.6 6. Borderline elevated troponin secondary to demand ischemia from acute pancreatitis.  No further work-up as per cardiology 7. History of hepatitis C  Code Status:     Code Status Orders  (From admission, onward)         Start     Ordered   06/23/19  0331  Full code  Continuous     06/23/19 0331        Code Status History    Date Active Date Inactive Code Status Order ID Comments User Context   09/13/2018 2100 09/14/2018 1851 Full Code [id number]  Auburn Bilberry, MD Inpatient   03/19/2018 2027 03/20/2018 1444 Full Code [id number]  Adrian Saran, MD Inpatient   Advance Care Planning Activity      Disposition Plan: To be determined  Consultants:  Cardiology  Gastroenterology  Time spent: 35 minutes spent with patient in coordination of care and speaking with specialist and nursing staff   Alford Highland  [employer]

## 2019-06-23 NOTE — Progress Notes (Signed)
Pharmacy Antibiotic Note  Migel Hannis is a 48 y.o. male admitted on 06/22/2019 with acute pancreatitis. Pharmacy has been consulted for cefepime dosing.  Plan: Cefepime 2 g IV q8h  Height: 5\' 7"  (170.2 cm) Weight: 165 lb (74.8 kg) IBW/kg (Calculated) : 66.1  Temp (24hrs), Avg:98.6 F (37 C), Min:98.6 F (37 C), Max:98.6 F (37 C)  Recent Labs  Lab 06/22/19 2359 06/23/19 0524  WBC 4.3 5.0  CREATININE 0.70 0.72    Estimated Creatinine Clearance: 103.3 mL/min (by C-G formula based on SCr of 0.72 mg/dL).    Allergies  Allergen Reactions  . Penicillins Other (See Comments)    Childhood reaction    Antimicrobials this admission: Cefepime 8/16 >> Flagyl 8/16 >>  Dose adjustments this admission: NA  Microbiology results:   Thank you for allowing pharmacy to be a part of this patient's care.  Tawnya Crook, PharmD 06/23/2019 1:26 PM

## 2019-06-23 NOTE — H&P (Signed)
[facility] [place] [facility]   PATIENT NAME: Bruce [name]    MR#:  [id number]  DATE OF BIRTH:  May 24, 1971  DATE OF ADMISSION:  06/22/2019  PRIMARY CARE PHYSICIAN: Patient, No Pcp Per   REQUESTING/REFERRING PHYSICIAN: Dorothea Glassman, MD  CHIEF COMPLAINT:   Chief Complaint  Patient presents with  . Abdominal Pain    HISTORY OF PRESENT ILLNESS:  Bland [place]  is a 48 y.o. male with a known history of atrial fibrillation, hepatitis C, polysubstance abuse.  He presented to the emergency room complaining of epigastric and upper abdominal pain which began around 5 PM after patient drank one half beer.  Patient reports pain is burning and aching with a pain score 6-8 out of 10 and has been constant with no relieving factors since it began.  He has experienced nausea and vomiting as well as diarrhea stools since that time as well however no hematemesis, hematochezia, or melena.  He reports having sharp left anterior chest pain which lasted less than 5 seconds since the abdominal pain began.  He had no associated shortness of breath, diaphoresis, nausea, or radiation of the pain.  He has no history of MI or stroke.  CT abdomen demonstrates acute pancreatitis with ill-defined focal hypodensity in the uncinate process of the pancreas.  No drainable fluid collection or abscess.  EKG demonstrates inferior T wave change.  Troponin is 25.  He has been admitted to the hospital service for further evaluation and management.  PAST MEDICAL HISTORY:   Past Medical History:  Diagnosis Date  . A-fib (HCC)   . Dental caries   . Hepatitis C infection   . Iritis   . PAF (paroxysmal atrial fibrillation) (HCC)    a. diagnosed 03/19/18; b. CHADS2VASc 0  . Polysubstance abuse (HCC)    a. cocaine, tobacco, etoh  . Scrotal mass   . Tick borne fever   . Uveitis     PAST SURGICAL HISTORY:   Past Surgical History:  Procedure Laterality Date  . FINGER SURGERY     infection, middle  left  . fractured jaw    . SHOULDER SURGERY     right    SOCIAL HISTORY:   Social History   Tobacco Use  . Smoking status: Current Some Day Smoker    Packs/day: 0.25    Types: Cigarettes  . Smokeless tobacco: Never Used  . Tobacco comment: Per pt  " I only smoke cigarettes when I drink beer"  Substance Use Topics  . Alcohol use: Yes    Comment: 6 pack per week    FAMILY HISTORY:   Family History  Problem Relation Age of Onset  . Drug abuse Father     DRUG ALLERGIES:   Allergies  Allergen Reactions  . Penicillins Other (See Comments)    Childhood reaction    REVIEW OF SYSTEMS:   Review of Systems  Constitutional: Negative for chills, fever and malaise/fatigue.  HENT: Negative for congestion, nosebleeds, sinus pain and sore throat.   Eyes: Negative for blurred vision and double vision.  Respiratory: Negative for cough, sputum production, shortness of breath and wheezing.   Cardiovascular: Positive for chest pain (sharp left chest). Negative for palpitations, orthopnea and leg swelling.  Gastrointestinal: Positive for abdominal pain, diarrhea, nausea and vomiting. Negative for blood in stool, constipation, heartburn and melena.  Genitourinary: Negative for dysuria, flank pain and hematuria.  Musculoskeletal: Negative for falls and myalgias.  Skin: Negative for itching and rash.  Neurological: Negative for dizziness, weakness and headaches.  Psychiatric/Behavioral: Negative for depression.   MEDICATIONS AT HOME:   Prior to Admission medications   Medication Sig Start Date End Date Taking? Authorizing Provider  aluminum-magnesium hydroxide-simethicone (MAALOX) 200-200-20 MG/5ML SUSP Take 30 mLs by mouth 4 (four) times daily -  before meals and at bedtime. 04/03/19   Sharman Cheek, MD  clotrimazole (MYCELEX) 10 MG troche Take 1 tablet (10 mg total) by mouth 5 (five) times daily. 03/08/19   Sharyn Creamer, MD  diltiazem (CARDIZEM CD) 180 MG 24 hr capsule Take 1 capsule  (180 mg total) by mouth daily. 11/22/18 02/20/19  Sondra Barges, PA-C  famotidine (PEPCID) 20 MG tablet Take 1 tablet (20 mg total) by mouth 2 (two) times daily. 04/03/19 07/02/19  Sharman Cheek, MD  flecainide (TAMBOCOR) 50 MG tablet Take 1 tablet (50 mg total) by mouth 2 (two) times daily. 10/16/18 01/20/19  Dunn, Raymon Mutton, PA-C      VITAL SIGNS:  Blood pressure (!) 148/95, pulse 84, temperature 98.6 F (37 C), temperature source Oral, resp. rate (!) 26, height 5\' 7"  (1.702 m), weight 74.8 kg, SpO2 100 %.  PHYSICAL EXAMINATION:  Physical Exam  GENERAL:  48 y.o.-year-old patient lying in the bed with no acute distress.  EYES: Pupils equal, round, reactive to light and accommodation. No scleral icterus. Extraocular muscles intact.  HEENT: Head atraumatic, normocephalic. Oropharynx and nasopharynx clear.  NECK:  Supple, no jugular venous distention. No thyroid enlargement, no tenderness.  LUNGS: Normal breath sounds bilaterally, no wheezing, rales,rhonchi or crepitation. No use of accessory muscles of respiration.  CARDIOVASCULAR: Regular rate and rhythm, S1, S2 normal. No murmurs, rubs, or gallops.  ABDOMEN: Soft, nondistended, with epigastric tenderness. Bowel sounds present. No organomegaly or mass.  EXTREMITIES: No pedal edema, cyanosis, or clubbing.  NEUROLOGIC: Cranial nerves II through XII are intact. Muscle strength 5/5 in all extremities. Sensation intact. Gait not checked.  PSYCHIATRIC: The patient is alert and oriented x 3.  Normal affect and good eye contact. SKIN: No obvious rash, lesion, or ulcer.   LABORATORY PANEL:   CBC Recent Labs  Lab 06/22/19 2359  WBC 4.3  HGB 16.3  HCT 46.0  PLT 153   ------------------------------------------------------------------------------------------------------------------  Chemistries  Recent Labs  Lab 06/22/19 2359  NA 137  K 3.6  CL 103  CO2 23  GLUCOSE 92  BUN 11  CREATININE 0.70  CALCIUM 9.3  AST 113*  ALT 78*  ALKPHOS  94  BILITOT 1.2   ------------------------------------------------------------------------------------------------------------------  Cardiac Enzymes No results for input(s): TROPONINI in the last 168 hours. ------------------------------------------------------------------------------------------------------------------  RADIOLOGY:  Ct Abdomen Pelvis W Contrast  Result Date: 06/23/2019 CLINICAL DATA:  48 year old male with upper abdominal pain. EXAM: CT ABDOMEN AND PELVIS WITH CONTRAST TECHNIQUE: Multidetector CT imaging of the abdomen and pelvis was performed using the standard protocol following bolus administration of intravenous contrast. CONTRAST:  OMNIPAQUE IOHEXOL 300 MG/ML  SOLN COMPARISON:  CT of the abdomen pelvis dated 05/02/2012 FINDINGS: Lower chest: The visualized lung bases are clear. No intra-abdominal free air or free fluid. Hepatobiliary: Diffuse fatty infiltration of the liver. No intrahepatic biliary ductal dilatation. The gallbladder is unremarkable. Pancreas: There is diffuse inflammatory changes of the pancreas consistent with acute pancreatitis. Ill-defined focal hypodensity in the uncinate process of the pancreas may represent an area of edema or necrosis. No drainable fluid collection/abscess or pseudocyst identified at this time. Spleen: Normal in size without focal abnormality. Adrenals/Urinary Tract: The adrenal glands are unremarkable.  There is no hydronephrosis on either side. There is symmetric enhancement and excretion of contrast by both kidneys. The visualized ureters and urinary bladder appear unremarkable. Stomach/Bowel: The stomach is distended. There is diffuse inflammatory changes and thickening of the proximal duodenum secondary to inflammatory changes of the pancreas. There is probable associated luminal narrowing and resulting in a degree of gastric outlet obstruction and distention of the stomach. The appendix is normal. Vascular/Lymphatic: The abdominal  aorta and IVC are unremarkable. The splenic vein, SMV, and main portal vein are patent. No portal venous gas. No adenopathy. Reproductive: The prostate and seminal vesicles are grossly unremarkable. Bilateral hydroceles partially visualized. Other: None Musculoskeletal: No acute or significant osseous findings. Degenerative changes primarily at L4-L5 with grade 1 anterolisthesis. IMPRESSION: 1. Acute pancreatitis. Ill-defined focal hypodensity in the uncinate process of the pancreas may represent an area of edema or necrosis. No drainable fluid collection/abscess or pseudocyst. 2. Fatty liver. 3. Distended stomach with findings suggestive of degree of gastric outlet obstruction secondary to reactive inflammatory changes of the duodenum. Electronically Signed   By: Elgie Collard M.D.   On: 06/23/2019 01:17      IMPRESSION AND PLAN:   1.  Acute pancreatitis - N.p.o. - Normal saline at 100 cc/h - We will manage pain with analgesic - We discussed the importance of alcohol abstinence  2.  Elevated troponin - Continue to trend troponin levels -EKG in the a.m. -Echocardiogram - Cardiology consulted for further evaluation - Telemetry monitoring - Basic metabolic profile and CBC in the a.m.  3.  EtOH abuse - CIWA protocol  4.  History of atrial fibrillation - Diltiazem continued -Telemetry monitoring - BMP in the a.m. -Echocardiogram  DVT and PPI prophylaxis    All the records are reviewed and case discussed with ED provider. The plan of care was discussed in details with the patient (and family). I answered all questions. The patient agreed to proceed with the above mentioned plan. Further management will depend upon hospital course.   CODE STATUS: Full code  TOTAL TIME TAKING CARE OF THIS PATIENT:45 minutes.    Milas Kocher Andalyn Heckstall CRNPon 06/23/2019 at 2:35 AM  Pager - [phone]  After 6pm go to [email or web address] - Social research officer, government  [facility]   Office  [phone]  CC: Primary care physician; Patient, No Pcp Per   Note: This dictation was prepared with Dragon dictation along with smaller phrase technology. Any transcriptional errors that result from this process are unintentional.

## 2019-06-23 NOTE — ED Notes (Signed)
Patient given two warm blankets. Wife at bedside.

## 2019-06-23 NOTE — ED Notes (Signed)
Attempt to call report without success.  

## 2019-06-23 NOTE — Consult Note (Addendum)
Cephas Darby, MD [address]  [place]  [place],  [address]  Main: [phone]  Fax: [phone] Pager: [phone]   Consultation  Referring Provider:     No ref. provider found Primary Care Physician:  Patient, No Pcp Per Primary Gastroenterologist: Althia Forts        Reason for Consultation:     Gastric outlet obstruction  Date of Admission:  06/22/2019 Date of Consultation:  06/23/2019         HPI:   Bruce [name] is a 48 y.o. male with history of polysubstance abuse, alcohol use who is admitted yesterday with acute alcoholic pancreatitis.  Patient presented with upper abdominal pain associated with nausea and vomiting.  He was found to have elevated lipase in 300s, underwent CT abdomen and pelvis which confirmed acute pancreatitis and inflammation in the duodenum with possible gastric outlet obstruction.  Mildly elevated transaminases consistent with alcoholic liver disease, normal hemoglobin. Patient is started on IV fluids, kept n.p.o.  GI is consulted to evaluate for possible gastric outlet obstruction.  He is currently on IV Protonix.  When I interviewed the patient, he reports upper abdominal pain, he is tolerating water well.  He denies nausea or vomiting, fever, chills.  His last drink of alcohol was on Friday night.  He had 4 beers that day.  He said he had similar episode when he was 70. Patient also has history of paroxysmal A. fib.  He had mildly elevated troponin for which cardiology was consulted who recommended no further work-up at this time  NSAIDs: He is also taking Aleve 3 to 4 pills daily for chest pain for last 1 month  Antiplts/Anticoagulants/Anti thrombotics: None  GI Procedures: None  Past Medical History:  Diagnosis Date   A-fib ([place])    Dental caries    Hepatitis C infection    Iritis    PAF (paroxysmal atrial fibrillation) ([place])    a. diagnosed 03/19/18; b. CHADS2VASc 0   Polysubstance abuse ([place])    a. cocaine, tobacco,  etoh   Scrotal mass    Tick borne fever    Uveitis     Past Surgical History:  Procedure Laterality Date   FINGER SURGERY     infection, middle left   fractured jaw     SHOULDER SURGERY     right    Prior to Admission medications   Medication Sig Start Date End Date Taking? Authorizing Provider  flecainide (TAMBOCOR) 50 MG tablet Take 1 tablet (50 mg total) by mouth 2 (two) times daily. 10/16/18 06/23/19 Yes Dunn, Areta Haber, PA-C  aluminum-magnesium hydroxide-simethicone (MAALOX) 200-200-20 MG/5ML SUSP Take 30 mLs by mouth 4 (four) times daily -  before meals and at bedtime. Patient not taking: Reported on 06/23/2019 04/03/19   Carrie Mew, MD  clotrimazole [facility]) 10 MG troche Take 1 tablet (10 mg total) by mouth 5 (five) times daily. Patient not taking: Reported on 06/23/2019 03/08/19   Delman Kitten, MD  diltiazem (CARDIZEM CD) 180 MG 24 hr capsule Take 1 capsule (180 mg total) by mouth daily. Patient not taking: Reported on 06/23/2019 11/22/18 06/23/19  Rise Mu, PA-C  famotidine (PEPCID) 20 MG tablet Take 1 tablet (20 mg total) by mouth 2 (two) times daily. Patient not taking: Reported on 06/23/2019 04/03/19 07/02/19  Carrie Mew, MD   Current Facility-Administered Medications:    0.9 %  sodium chloride infusion, , Intravenous, Continuous, Loletha Grayer, MD, Last Rate: 125 mL/hr at 06/23/19 1418  0.9 %  sodium chloride infusion, 250 mL, Intravenous, PRN, Seals, Angela H, NP   ceFEPIme (MAXIPIME) 2 g in sodium chloride 0.9 % 100 mL IVPB, 2 g, Intravenous, Q8H, Ellington, Abby K, RPH   diphenhydrAMINE (BENADRYL) injection 25 mg, 25 mg, Intravenous, Q6H PRN, Mansy, Jan A, MD, 25 mg at 06/23/19 0730   enoxaparin (LOVENOX) injection 40 mg, 40 mg, Subcutaneous, Q24H, Seals, Angela H, NP, 40 mg at 95/62/13 0865   folic acid (FOLVITE) tablet 1 mg, 1 mg, Oral, Daily, Seals, Angela H, NP, 1 mg at 06/23/19 1033   ketorolac (TORADOL) 15 MG/ML injection 15 mg, 15 mg,  Intravenous, Q6H PRN, Mansy, Jan A, MD   LORazepam (ATIVAN) tablet 1 mg, 1 mg, Oral, Q6H PRN, 1 mg at 06/23/19 1307 **OR** LORazepam (ATIVAN) injection 1 mg, 1 mg, Intravenous, Q6H PRN, Seals, Theo Dills, NP   metroNIDAZOLE (FLAGYL) IVPB 500 mg, 500 mg, Intravenous, Q8H, Wieting, Richard, MD   morphine 2 MG/ML injection 2 mg, 2 mg, Intravenous, Q3H PRN, Mansy, Jan A, MD, 2 mg at 06/23/19 1031   multivitamin with minerals tablet 1 tablet, 1 tablet, Oral, Daily, Seals, Angela H, NP, 1 tablet at 06/23/19 1031   ondansetron (ZOFRAN) tablet 4 mg, 4 mg, Oral, Q6H PRN **OR** ondansetron (ZOFRAN) injection 4 mg, 4 mg, Intravenous, Q6H PRN, Seals, Angela H, NP, 4 mg at 06/23/19 0417   pantoprazole (PROTONIX) injection 40 mg, 40 mg, Intravenous, Q12H, Seals, Angela H, NP, 40 mg at 06/23/19 1053   sodium chloride flush (NS) 0.9 % injection 3 mL, 3 mL, Intravenous, Q12H, Seals, Angela H, NP, 3 mL at 06/23/19 1054   sodium chloride flush (NS) 0.9 % injection 3 mL, 3 mL, Intravenous, PRN, Seals, Angela H, NP   thiamine (VITAMIN B-1) tablet 100 mg, 100 mg, Oral, Daily, 100 mg at 06/23/19 1031 **OR** thiamine (B-1) injection 100 mg, 100 mg, Intravenous, Daily, Seals, Theo Dills, NP   Family History  Problem Relation Age of Onset   Drug abuse Father      Social History   Tobacco Use   Smoking status: Current Some Day Smoker    Packs/day: 0.25    Types: Cigarettes   Smokeless tobacco: Never Used   Tobacco comment: Per pt  " I only smoke cigarettes when I drink beer"  Substance Use Topics   Alcohol use: Yes    Comment: 6 pack per week   Drug use: Yes    Types: IV, Cocaine    Comment: reports last time he used cocaine was thursday     Allergies as of 06/22/2019 - Review Complete 06/22/2019  Allergen Reaction Noted   Penicillins Other (See Comments) 03/30/2012    Review of Systems:    All systems reviewed and negative except where noted in HPI.   Physical Exam:  Vital signs in last  24 hours: Temp:  [98.6 F (37 C)] 98.6 F (37 C) (08/16 0645) Pulse Rate:  [61-86] 72 (08/16 1229) Resp:  [13-31] 18 (08/16 1229) BP: (121-153)/(71-109) 138/90 (08/16 1229) SpO2:  [95 %-100 %] 98 % (08/16 1229) Weight:  [74.8 kg] 74.8 kg (08/15 2313) Last BM Date: 06/22/19 General:   Pleasant, cooperative in NAD Head:  Normocephalic and atraumatic. Eyes:   No icterus.   Conjunctiva pink. PERRLA. Ears:  Normal auditory acuity. Neck:  Supple; no masses or thyroidomegaly Lungs: Respirations even and unlabored. Lungs clear to auscultation bilaterally.   No wheezes, crackles, or rhonchi.  Heart:  Regular rate and  rhythm;  Without murmur, clicks, rubs or gallops Abdomen:  Soft, nondistended, mild tenderness in epigastric region. Normal bowel sounds. No appreciable masses or hepatomegaly.  No rebound or guarding.  Rectal:  Not performed. Msk:  Symmetrical without gross deformities.  Strength normal Extremities:  Without edema, cyanosis or clubbing. Neurologic:  Alert and oriented x3;  grossly normal neurologically. Psych:  Alert and cooperative. Normal affect.  LAB RESULTS: CBC Latest Ref Rng & Units 06/23/2019 06/22/2019 04/18/2019  WBC 4.0 - 10.5 K/uL 5.0 4.3 3.2(L)  Hemoglobin 13.0 - 17.0 g/dL 15.8 16.3 16.8  Hematocrit 39.0 - 52.0 % 45.6 46.0 47.5  Platelets 150 - 400 K/uL 147(L) 153 146(L)    BMET BMP Latest Ref Rng & Units 06/23/2019 06/22/2019 04/18/2019  Glucose 70 - 99 mg/dL 78 92 97  BUN 6 - 20 mg/dL 9 11 7   Creatinine 0.61 - 1.24 mg/dL 0.72 0.70 0.69  BUN/Creat Ratio 9 - 20 - - -  Sodium 135 - 145 mmol/L 139 137 141  Potassium 3.5 - 5.1 mmol/L 3.8 3.6 3.8  Chloride 98 - 111 mmol/L 105 103 105  CO2 22 - 32 mmol/L 22 23 23   Calcium 8.9 - 10.3 mg/dL 8.8(L) 9.3 9.3    LFT Hepatic Function Latest Ref Rng & Units 06/22/2019 04/18/2019 03/08/2019  Total Protein 6.5 - 8.1 g/dL 8.0 8.4(H) 8.2(H)  Albumin 3.5 - 5.0 g/dL 4.2 4.3 3.9  AST 15 - 41 U/L 113(H) 185(H) 72(H)  ALT 0 - 44  U/L 78(H) 126(H) 67(H)  Alk Phosphatase 38 - 126 U/L 94 90 94  Total Bilirubin 0.3 - 1.2 mg/dL 1.2 1.2 1.0  Bilirubin, Direct 0.0 - 0.2 mg/dL - 0.4(H) 0.3(H)     STUDIES: Ct Abdomen Pelvis W Contrast  Result Date: 06/23/2019 CLINICAL DATA:  48 year old male with upper abdominal pain. EXAM: CT ABDOMEN AND PELVIS WITH CONTRAST TECHNIQUE: Multidetector CT imaging of the abdomen and pelvis was performed using the standard protocol following bolus administration of intravenous contrast. CONTRAST:  129m OMNIPAQUE IOHEXOL 300 MG/ML  SOLN COMPARISON:  CT of the abdomen pelvis dated 05/02/2012 FINDINGS: Lower chest: The visualized lung bases are clear. No intra-abdominal free air or free fluid. Hepatobiliary: Diffuse fatty infiltration of the liver. No intrahepatic biliary ductal dilatation. The gallbladder is unremarkable. Pancreas: There is diffuse inflammatory changes of the pancreas consistent with acute pancreatitis. Ill-defined focal hypodensity in the uncinate process of the pancreas may represent an area of edema or necrosis. No drainable fluid collection/abscess or pseudocyst identified at this time. Spleen: Normal in size without focal abnormality. Adrenals/Urinary Tract: The adrenal glands are unremarkable. There is no hydronephrosis on either side. There is symmetric enhancement and excretion of contrast by both kidneys. The visualized ureters and urinary bladder appear unremarkable. Stomach/Bowel: The stomach is distended. There is diffuse inflammatory changes and thickening of the proximal duodenum secondary to inflammatory changes of the pancreas. There is probable associated luminal narrowing and resulting in a degree of gastric outlet obstruction and distention of the stomach. The appendix is normal. Vascular/Lymphatic: The abdominal aorta and IVC are unremarkable. The splenic vein, SMV, and main portal vein are patent. No portal venous gas. No adenopathy. Reproductive: The prostate and seminal  vesicles are grossly unremarkable. Bilateral hydroceles partially visualized. Other: None Musculoskeletal: No acute or significant osseous findings. Degenerative changes primarily at L4-L5 with grade 1 anterolisthesis. IMPRESSION: 1. Acute pancreatitis. Ill-defined focal hypodensity in the uncinate process of the pancreas may represent an area of edema or necrosis. No  drainable fluid collection/abscess or pseudocyst. 2. Fatty liver. 3. Distended stomach with findings suggestive of degree of gastric outlet obstruction secondary to reactive inflammatory changes of the duodenum. Electronically Signed   By: Anner Crete M.D.   On: 06/23/2019 01:17      Impression / Plan:   Bruce [name] is a 48 y.o. male with alcohol abuse, polysubstance abuse, history of cocaine abuse admitted with acute alcoholic pancreatitis.  GI is consulted for duodenal inflammation and possible gastric outlet obstruction.  Patient has history of heavy NSAID use for chest pain and peptic ulcer disease is a possibility to explain the CT findings  Partial gastric outlet obstruction secondary to severe duodenal inflammation seen on the CT scan Recommend to continue IV Protonix Continue maintenance IV fluids for acute pancreatitis Okay with clear liquid diet as patient denies nausea or vomiting EGD can be performed closer to discharge once he recovers from acute pancreatitis Perform urine drug screen to rule out cocaine abuse as patient will need to undergo anesthesia for EGD Abstinence from alcohol and stop NSAID use Chest pain probably secondary to erosive/reflux esophagitis from chronic alcohol use  Thank you for involving me in the care of this patient.  Dr. Vicente Males will cover from tomorrow    LOS: 0 days   Sherri Sear, MD  06/23/2019, 2:40 PM   Note: This dictation was prepared with Dragon dictation along with smaller phrase technology. Any transcriptional errors that result from this process are unintentional.

## 2019-06-23 NOTE — ED Notes (Signed)
ED TO INPATIENT HANDOFF REPORT  ED Nurse Name and Phone #: laurie [phone]   S Name/Age/Gender Bruce [name] 48 y.o. male Room/Bed: ED13A/ED13A  Code Status   Code Status: Full Code  Home/SNF/Other Home Patient oriented to: self, place, time and situation Is this baseline? Yes   Triage Complete: Triage complete  Chief Complaint abd pain  Triage Note Pt arrives POV to triage with c/o upper left sided abdominal radiating to the right side. Pt states that the pain started at 1700.   Allergies Allergies  Allergen Reactions  . Penicillins Other (See Comments)    Childhood reaction    Level of Care/Admitting Diagnosis ED Disposition    ED Disposition Condition [facility]: [place] [[id number]]  Level of Care: Med-Surg [16]  Covid Evaluation: Asymptomatic Screening Protocol (No Symptoms)  Diagnosis: Pancreatitis [381771]  Admitting Physician: Mayer Camel [[id number]]  Attending Physician: Mayer Camel [[id number]]  Estimated length of stay: past midnight tomorrow  Certification:: I certify this patient will need inpatient services for at least 2 midnights  PT Class (Do Not Modify): Inpatient [101]  PT Acc Code (Do Not Modify): Private [1]       B Medical/Surgery History Past Medical History:  Diagnosis Date  . A-fib ([place])   . Dental caries   . Hepatitis C infection   . Iritis   . PAF (paroxysmal atrial fibrillation) ([place])    a. diagnosed 03/19/18; b. CHADS2VASc 0  . Polysubstance abuse ([place])    a. cocaine, tobacco, etoh  . Scrotal mass   . Tick borne fever   . Uveitis    Past Surgical History:  Procedure Laterality Date  . FINGER SURGERY     infection, middle left  . fractured jaw    . SHOULDER SURGERY     right     A IV Location/Drains/Wounds Patient Lines/Drains/Airways Status   Active Line/Drains/Airways    Name:   Placement date:   Placement time:   Site:   Days:   Peripheral IV 06/22/19 Left Hand    06/22/19    2328    Hand   1          Intake/Output Last 24 hours No intake or output data in the 24 hours ending 06/23/19 0334  Labs/Imaging Results for orders placed or performed during the hospital encounter of 06/22/19 (from the past 48 hour(s))  Urinalysis, Complete w Microscopic     Status: Abnormal   Collection Time: 06/22/19 11:25 PM  Result Value Ref Range   Color, Urine YELLOW (A) YELLOW   APPearance CLEAR (A) CLEAR   Specific Gravity, Urine 1.018 1.005 - 1.030   pH 5.0 5.0 - 8.0   Glucose, UA NEGATIVE NEGATIVE mg/dL   Hgb urine dipstick NEGATIVE NEGATIVE   Bilirubin Urine NEGATIVE NEGATIVE   Ketones, ur 20 (A) NEGATIVE mg/dL   Protein, ur 30 (A) NEGATIVE mg/dL   Nitrite NEGATIVE NEGATIVE   Leukocytes,Ua NEGATIVE NEGATIVE   RBC / HPF 0-5 0 - 5 RBC/hpf   WBC, UA 0-5 0 - 5 WBC/hpf   Bacteria, UA NONE SEEN NONE SEEN   Squamous Epithelial / LPF 0-5 0 - 5   Mucus PRESENT     Comment: Performed at [facility], [address] [place], [place] [address]  CBC     Status: None   Collection Time: 06/22/19 11:59 PM  Result Value Ref Range   WBC 4.3 4.0 - 10.5 K/uL  RBC 5.28 4.22 - 5.81 MIL/uL   Hemoglobin 16.3 13.0 - 17.0 g/dL   HCT 96.246.0 95.239.0 - 84.152.0 %   MCV 87.1 80.0 - 100.0 fL   MCH 30.9 26.0 - 34.0 pg   MCHC 35.4 30.0 - 36.0 g/dL   RDW 32.413.1 40.111.5 - 02.715.5 %   Platelets 153 150 - 400 K/uL   nRBC 0.0 0.0 - 0.2 %    Comment: Performed at [facility], [address] [place], [place] [address]  Comprehensive metabolic panel     Status: Abnormal   Collection Time: 06/22/19 11:59 PM  Result Value Ref Range   Sodium 137 135 - 145 mmol/L   Potassium 3.6 3.5 - 5.1 mmol/L   Chloride 103 98 - 111 mmol/L   CO2 23 22 - 32 mmol/L   Glucose, Bld 92 70 - 99 mg/dL   BUN 11 6 - 20 mg/dL   Creatinine, Ser 4.400.70 0.61 - 1.24 mg/dL   Calcium 9.3 8.9 - 34.710.3 mg/dL   Total Protein 8.0 6.5 - 8.1 g/dL   Albumin 4.2 3.5 - 5.0 g/dL   AST 425113 (H) 15 - 41 U/L   ALT 78  (H) 0 - 44 U/L   Alkaline Phosphatase 94 38 - 126 U/L   Total Bilirubin 1.2 0.3 - 1.2 mg/dL   GFR calc non Af Amer >60 >60 mL/min   GFR calc Af Amer >60 >60 mL/min   Anion gap 11 5 - 15    Comment: Performed at [facility], [address] [place], [place] [address]  Lipase, blood     Status: Abnormal   Collection Time: 06/22/19 11:59 PM  Result Value Ref Range   Lipase 378 (H) 11 - 51 U/L    Comment: Performed at [facility], [address] [place], [place] [address]  Troponin I (High Sensitivity)     Status: Abnormal   Collection Time: 06/22/19 11:59 PM  Result Value Ref Range   Troponin I (High Sensitivity) 25 (H) <18 ng/L    Comment: (NOTE) Elevated high sensitivity troponin I (hsTnI) values and significant  changes across serial measurements may suggest ACS but many other  chronic and acute conditions are known to elevate hsTnI results.  Refer to the "Links" section for chest pain algorithms and additional  guidance. Performed at [facility], [address] [place], [place] [address]   Troponin I (High Sensitivity)     Status: Abnormal   Collection Time: 06/23/19  2:04 AM  Result Value Ref Range   Troponin I (High Sensitivity) 24 (H) <18 ng/L    Comment: (NOTE) Elevated high sensitivity troponin I (hsTnI) values and significant  changes across serial measurements may suggest ACS but many other  chronic and acute conditions are known to elevate hsTnI results.  Refer to the "Links" section for chest pain algorithms and additional  guidance. Performed at [facility], [address] [place], [place] [address]    Ct Abdomen Pelvis W Contrast  Result Date: 06/23/2019 CLINICAL DATA:  48 year old male with upper abdominal pain. EXAM: CT ABDOMEN AND PELVIS WITH CONTRAST TECHNIQUE: Multidetector CT imaging of the abdomen and pelvis was performed using the standard protocol following bolus administration of intravenous contrast.  CONTRAST:  100mL OMNIPAQUE IOHEXOL 300 MG/ML  SOLN COMPARISON:  CT of the abdomen pelvis dated 05/02/2012 FINDINGS: Lower chest: The visualized lung bases are clear. No intra-abdominal free air or free fluid. Hepatobiliary: Diffuse fatty infiltration of the liver.  No intrahepatic biliary ductal dilatation. The gallbladder is unremarkable. Pancreas: There is diffuse inflammatory changes of the pancreas consistent with acute pancreatitis. Ill-defined focal hypodensity in the uncinate process of the pancreas may represent an area of edema or necrosis. No drainable fluid collection/abscess or pseudocyst identified at this time. Spleen: Normal in size without focal abnormality. Adrenals/Urinary Tract: The adrenal glands are unremarkable. There is no hydronephrosis on either side. There is symmetric enhancement and excretion of contrast by both kidneys. The visualized ureters and urinary bladder appear unremarkable. Stomach/Bowel: The stomach is distended. There is diffuse inflammatory changes and thickening of the proximal duodenum secondary to inflammatory changes of the pancreas. There is probable associated luminal narrowing and resulting in a degree of gastric outlet obstruction and distention of the stomach. The appendix is normal. Vascular/Lymphatic: The abdominal aorta and IVC are unremarkable. The splenic vein, SMV, and main portal vein are patent. No portal venous gas. No adenopathy. Reproductive: The prostate and seminal vesicles are grossly unremarkable. Bilateral hydroceles partially visualized. Other: None Musculoskeletal: No acute or significant osseous findings. Degenerative changes primarily at L4-L5 with grade 1 anterolisthesis. IMPRESSION: 1. Acute pancreatitis. Ill-defined focal hypodensity in the uncinate process of the pancreas may represent an area of edema or necrosis. No drainable fluid collection/abscess or pseudocyst. 2. Fatty liver. 3. Distended stomach with findings suggestive of degree of  gastric outlet obstruction secondary to reactive inflammatory changes of the duodenum. Electronically Signed   By: Elgie CollardArash  Radparvar M.D.   On: 06/23/2019 01:17    Pending Labs Unresulted Labs (From admission, onward)    Start     Ordered   06/30/19 0500  Creatinine, serum  (enoxaparin (LOVENOX)    CrCl >/= 30 ml/min)  Weekly,   STAT    Comments: while on enoxaparin therapy    06/23/19 0331   06/23/19 0500  Basic metabolic panel  Tomorrow morning,   STAT     06/23/19 0331   06/23/19 0500  CBC  Tomorrow morning,   STAT     06/23/19 0331   06/23/19 0331  HIV antibody (Routine Testing)  Once,   STAT     06/23/19 0331   06/23/19 0331  CBC  (enoxaparin (LOVENOX)    CrCl >/= 30 ml/min)  Once,   STAT    Comments: Baseline for enoxaparin therapy IF NOT ALREADY DRAWN.  Notify MD if PLT < 100 K.    06/23/19 0331   06/23/19 0331  Creatinine, serum  (enoxaparin (LOVENOX)    CrCl >/= 30 ml/min)  Once,   STAT    Comments: Baseline for enoxaparin therapy IF NOT ALREADY DRAWN.    06/23/19 0331   06/23/19 0331  Hemoglobin A1c  Once,   STAT     06/23/19 0331          Vitals/Pain Today's Vitals   06/23/19 0030 06/23/19 0200 06/23/19 0230 06/23/19 0245  BP: (!) 130/95 (!) 138/100 (!) 149/94   Pulse: 86   65  Resp: 16 (!) 24 20 16   Temp:      TempSrc:      SpO2: 99%   98%  Weight:      Height:      PainSc:        Isolation Precautions No active isolations  Medications Medications  diltiazem (CARDIZEM CD) 24 hr capsule 180 mg (has no administration in time range)  pantoprazole (PROTONIX) injection 40 mg (has no administration in time range)  enoxaparin (LOVENOX) injection 40 mg (has no administration  in time range)  0.9 %  sodium chloride infusion (has no administration in time range)  sodium chloride flush (NS) 0.9 % injection 3 mL (has no administration in time range)  sodium chloride flush (NS) 0.9 % injection 3 mL (has no administration in time range)  0.9 %  sodium chloride  infusion (has no administration in time range)  ondansetron (ZOFRAN) tablet 4 mg (has no administration in time range)    Or  ondansetron (ZOFRAN) injection 4 mg (has no administration in time range)  morphine 2 MG/ML injection 2 mg (has no administration in time range)  folic acid injection 1 mg (has no administration in time range)  thiamine (B-1) injection 100 mg (has no administration in time range)  LORazepam (ATIVAN) tablet 1 mg (has no administration in time range)    Or  LORazepam (ATIVAN) injection 1 mg (has no administration in time range)  thiamine (VITAMIN B-1) tablet 100 mg (has no administration in time range)    Or  thiamine (B-1) injection 100 mg (has no administration in time range)  folic acid (FOLVITE) tablet 1 mg (has no administration in time range)  multivitamin with minerals tablet 1 tablet (has no administration in time range)  alum & mag hydroxide-simeth (MAALOX/MYLANTA) 200-200-20 MG/5ML suspension 30 mL (30 mLs Oral Given 06/23/19 0006)    And  lidocaine (XYLOCAINE) 2 % viscous mouth solution 15 mL (15 mLs Oral Given 06/23/19 0006)  dicyclomine (BENTYL) 10 MG/5ML syrup 10 mg (10 mg Oral Given 06/23/19 0005)  morphine 4 MG/ML injection 4 mg (4 mg Intravenous Given 06/22/19 2347)  ondansetron (ZOFRAN) injection 4 mg (4 mg Intravenous Given 06/22/19 2345)  iohexol (OMNIPAQUE) 240 MG/ML injection 50 mL (50 mLs Oral Contrast Given 06/22/19 2354)  iohexol (OMNIPAQUE) 300 MG/ML solution 100 mL (100 mLs Intravenous Contrast Given 06/23/19 0046)  HYDROmorphone (DILAUDID) injection 0.5 mg (0.5 mg Intravenous Given 06/23/19 0209)  ondansetron (ZOFRAN) injection 4 mg (4 mg Intravenous Given 06/23/19 0209)  sodium chloride 0.9 % bolus 1,000 mL (1,000 mLs Intravenous New Bag/Given 06/23/19 0207)    Mobility walks Low fall risk   Focused Assessments GI   R Recommendations: See Admitting Provider Note  Report given to:   Additional Notes:

## 2019-06-23 NOTE — Progress Notes (Signed)
Patient c/o of 8/10 of abdominal pain as well of itching due to morphine. MD Mansy notified. Verbal order given for benadryl 25 mg q6 PRN, Toradol 15 mg q6 PRN and Morphine q3 PRN. Orders placed by this RN. Will administer and continue to monitor.   Update: Patient reports that he last used cocaine Thursday. Will update patients RN.    \\Annita Ratliff, Lysle Rubens

## 2019-06-23 NOTE — ED Notes (Signed)
Report given to Laurie Allen RN 

## 2019-06-23 NOTE — ED Notes (Signed)
Answered pt's call bell; pt c/o increasing pain and N/V; pt sitting up on elbow, using emesis bag; noted to be diaphoretic; MD notified;

## 2019-06-23 NOTE — ED Notes (Signed)
Report called to floor RN

## 2019-06-24 ENCOUNTER — Inpatient Hospital Stay (HOSPITAL_COMMUNITY)
Admit: 2019-06-24 | Discharge: 2019-06-24 | Disposition: A | Discharge status: Home / Self Care | Payer: Self-pay | Attending: Internal Medicine | Admitting: Internal Medicine

## 2019-06-24 ENCOUNTER — Inpatient Hospital Stay: Admit: 2019-06-24 | Payer: Self-pay

## 2019-06-24 DIAGNOSIS — R0789 Other chest pain: Secondary | ICD-10-CM

## 2019-06-24 LAB — LIPASE, BLOOD: Lipase: 1020 U/L — ABNORMAL HIGH (ref 11–51)

## 2019-06-24 LAB — ECHOCARDIOGRAM COMPLETE
Height: 67 in
Weight: 2473.6 oz

## 2019-06-24 MED ORDER — SODIUM CHLORIDE 0.9 % IV BOLUS
1000.0000 mL | Freq: Once | INTRAVENOUS | Status: AC
  Administered 2019-06-24: 1000 mL via INTRAVENOUS

## 2019-06-24 MED ORDER — LACTATED RINGERS IV SOLN
INTRAVENOUS | Status: DC
  Administered 2019-06-24 – 2019-06-28 (×8): via INTRAVENOUS

## 2019-06-24 MED ORDER — BOOST / RESOURCE BREEZE PO LIQD CUSTOM
1.0000 | Freq: Three times a day (TID) | ORAL | Status: DC
  Administered 2019-06-25: 1 via ORAL

## 2019-06-24 MED ORDER — DOCUSATE SODIUM 100 MG PO CAPS
100.0000 mg | ORAL_CAPSULE | Freq: Two times a day (BID) | ORAL | Status: DC
  Administered 2019-06-24 – 2019-06-28 (×8): 100 mg via ORAL
  Filled 2019-06-24 (×9): qty 1

## 2019-06-24 NOTE — Progress Notes (Signed)
Bruce MoodKiran Kohan [name] , MD [address], [place], [place], [address] [address], [place], [place], [address] Phone: [phone]  Fax: [phone]   Ofilia NeasBilly Bringle is being followed for acute pancreatitis    Subjective: Feels a bit better than yesterday , some pain in rt flank    Objective: Vital signs in last 24 hours: Vitals:   06/23/19 1920 06/24/19 0344 06/24/19 0500 06/24/19 0753  BP: (!) 147/98 (!) 153/94  (!) 153/96  Pulse: 79 71  68  Resp: 16 16  19   Temp: 98.6 F (37 C) 97.9 F (36.6 C)  (!) 100.5 F (38.1 C)  TempSrc: Oral Oral  Oral  SpO2: 100% 96%  97%  Weight:   70.1 kg   Height:       Weight change: -4.717 kg  Intake/Output Summary (Last 24 hours) at 06/24/2019 0901 Last data filed at 06/24/2019 [id number] Gross per 24 hour  Intake 1567.07 ml  Output 1100 ml  Net 467.07 ml     Exam: Heart:: Regular rate and rhythm, S1S2 present or without murmur or extra heart sounds Lungs: normal, clear to auscultation and clear to auscultation and percussion Abdomen: soft , mild distension, mild abdominal tenderness,no guarding or rigidity, BS+   Lab Results: @LABTEST2 @ Micro Results: Recent Results (from the past 240 hour(s))  SARS Coronavirus 2 [facility](Hospital order, Performed in [facility] lab) Nasopharyngeal Nasopharyngeal Swab     Status: None   Collection Time: 06/23/19  3:45 AM   Specimen: Nasopharyngeal Swab  Result Value Ref Range Status   SARS Coronavirus 2 NEGATIVE NEGATIVE Final    Comment: (NOTE) If result is NEGATIVE SARS-CoV-2 target nucleic acids are NOT DETECTED. The SARS-CoV-2 RNA is generally detectable in upper and lower  respiratory specimens during the acute phase of infection. The lowest  concentration of SARS-CoV-2 viral copies this assay can detect is 250  copies / mL. A negative result does not preclude SARS-CoV-2 infection  and should not be used as the sole basis for treatment or other  patient management decisions.   A negative result may occur with  improper specimen collection / handling, submission of specimen other  than nasopharyngeal swab, presence of viral mutation(s) within the  areas targeted by this assay, and inadequate number of viral copies  (<250 copies / mL). A negative result must be combined with clinical  observations, patient history, and epidemiological information. If result is POSITIVE SARS-CoV-2 target nucleic acids are DETECTED. The SARS-CoV-2 RNA is generally detectable in upper and lower  respiratory specimens dur ing the acute phase of infection.  Positive  results are indicative of active infection with SARS-CoV-2.  Clinical  correlation with patient history and other diagnostic information is  necessary to determine patient infection status.  Positive results do  not rule out bacterial infection or co-infection with other viruses. If result is PRESUMPTIVE POSTIVE SARS-CoV-2 nucleic acids MAY BE PRESENT.   A presumptive positive result was obtained on the submitted specimen  and confirmed on repeat testing.  While 2019 novel coronavirus  (SARS-CoV-2) nucleic acids may be present in the submitted sample  additional confirmatory testing may be necessary for epidemiological  and / or clinical management purposes  to differentiate between  SARS-CoV-2 and other Sarbecovirus currently known to infect humans.  If clinically indicated additional testing with an alternate test  methodology [phone]([id number]) is advised. The SARS-CoV-2 RNA is generally  detectable in upper and lower respiratory sp ecimens during the acute  phase  of infection. The expected result is Negative. Fact Sheet for Patients:  StrictlyIdeas.no Fact Sheet for Healthcare Providers: BankingDealers.co.za This test is not yet approved or cleared by the [place] FDA and has been authorized for detection and/or diagnosis of SARS-CoV-2 by FDA under an Emergency Use  Authorization (EUA).  This EUA will remain in effect (meaning this test can be used) for the duration of the COVID-19 declaration under Section 564(b)(1) of the Act, 21 U.S.C. section 360bbb-3(b)(1), unless the authorization is terminated or revoked sooner. Performed at [facility], [address], [place], [place] [address]    Studies/Results: Ct Abdomen Pelvis W Contrast  Result Date: 06/23/2019 CLINICAL DATA:  48 year old male with upper abdominal pain. EXAM: CT ABDOMEN AND PELVIS WITH CONTRAST TECHNIQUE: Multidetector CT imaging of the abdomen and pelvis was performed using the standard protocol following bolus administration of intravenous contrast. CONTRAST:  154mL OMNIPAQUE IOHEXOL 300 MG/ML  SOLN COMPARISON:  CT of the abdomen pelvis dated 05/02/2012 FINDINGS: Lower chest: The visualized lung bases are clear. No intra-abdominal free air or free fluid. Hepatobiliary: Diffuse fatty infiltration of the liver. No intrahepatic biliary ductal dilatation. The gallbladder is unremarkable. Pancreas: There is diffuse inflammatory changes of the pancreas consistent with acute pancreatitis. Ill-defined focal hypodensity in the uncinate process of the pancreas may represent an area of edema or necrosis. No drainable fluid collection/abscess or pseudocyst identified at this time. Spleen: Normal in size without focal abnormality. Adrenals/Urinary Tract: The adrenal glands are unremarkable. There is no hydronephrosis on either side. There is symmetric enhancement and excretion of contrast by both kidneys. The visualized ureters and urinary bladder appear unremarkable. Stomach/Bowel: The stomach is distended. There is diffuse inflammatory changes and thickening of the proximal duodenum secondary to inflammatory changes of the pancreas. There is probable associated luminal narrowing and resulting in a degree of gastric outlet obstruction and distention of the stomach. The appendix is normal.  Vascular/Lymphatic: The abdominal aorta and IVC are unremarkable. The splenic vein, SMV, and main portal vein are patent. No portal venous gas. No adenopathy. Reproductive: The prostate and seminal vesicles are grossly unremarkable. Bilateral hydroceles partially visualized. Other: None Musculoskeletal: No acute or significant osseous findings. Degenerative changes primarily at L4-L5 with grade 1 anterolisthesis. IMPRESSION: 1. Acute pancreatitis. Ill-defined focal hypodensity in the uncinate process of the pancreas may represent an area of edema or necrosis. No drainable fluid collection/abscess or pseudocyst. 2. Fatty liver. 3. Distended stomach with findings suggestive of degree of gastric outlet obstruction secondary to reactive inflammatory changes of the duodenum. Electronically Signed   By: Anner Crete M.D.   On: 06/23/2019 01:17   Medications: I have reviewed the patient's current medications. Scheduled Meds:  enoxaparin (LOVENOX) injection  40 mg Subcutaneous T01S   folic acid  1 mg Oral Daily   multivitamin with minerals  1 tablet Oral Daily   pantoprazole (PROTONIX) IV  40 mg Intravenous Q12H   sodium chloride flush  3 mL Intravenous Q12H   thiamine  100 mg Oral Daily   Or   thiamine  100 mg Intravenous Daily   Continuous Infusions:  sodium chloride 125 mL/hr at 06/24/19 0114   sodium chloride     ceFEPime (MAXIPIME) IV 2 g (06/24/19 0512)   metronidazole 500 mg (06/23/19 2226)   PRN Meds:.sodium chloride, diphenhydrAMINE, ketorolac, LORazepam **OR** LORazepam, morphine injection, ondansetron **OR** ondansetron (ZOFRAN) IV, sodium chloride flush   Assessment: Active Problems:   Pancreatitis  Gatlin Supinski 48 y.o. male with a history  of polysubstance, alcohol use.  Admitted on 06/22/2019 with alcoholic pancreatitis.  Initially on CT scan concern for possible gastric outlet obstruction.  Has been taking 3-4 Aleve daily for a month.  Urine positive for cocaine.  No  intrahepatic biliary ductal dilation on CT scan of the abdomen.  Plan: 1.  Stop all alcohol and illegal drug use. 2.  IV Ringer lactate would be the fluid of choice for acute pancreatitis. 3.  Commence on clears when he feels he is ready to start eating and drinking and gradually advance to a low-fat diet. 4.  If develops worsening abdominal pain and tenderness on examination may need to consider repeat imaging to rule out necrosis. 5.  Stop all NSAID use.  There is some concern for gastric outlet obstruction on the CAT scan which may be due to edema from all the pancreatitis.  We can follow him clinically if he is able to tolerate liquids then we could consider evaluation as an outpatient that he has had an acute episode of pancreatitis and in addition he has cocaine in his system which would be a contraindication for anesthesia at this point of time. 6. PPI  LOS: 1 day   Bruce MoodKiran Ilia Dimaano, MD 06/24/2019, 9:01 AM

## 2019-06-24 NOTE — Progress Notes (Signed)
*  PRELIMINARY RESULTS* Echocardiogram 2D Echocardiogram has been performed.  Bruce [name] 06/24/2019, 2:23 PM

## 2019-06-24 NOTE — Progress Notes (Signed)
[name], male   DOB: August 05, 1971, 48 y.o.   MRN: [id number]  [facility] PROGRESS NOTE  Bruce [name] ONG:[id number] DOB: 10/18/1971 DOA: 06/22/2019 PCP: Patient, No Pcp Per  HPI/Subjective: Patient still having a lot of pain in the abdomen.  Still have some nausea but no vomiting.  Still not feeling well at all.  Objective: Vitals:   06/24/19 0344 06/24/19 0753  BP: (!) 153/94 (!) 153/96  Pulse: 71 68  Resp: 16 19  Temp: 97.9 F (36.6 C) (!) 100.5 F (38.1 C)  SpO2: 96% 97%    Filed Weights   06/22/19 2313 06/24/19 0500  Weight: 74.8 kg 70.1 kg    ROS: Review of Systems  Constitutional: Negative for chills and fever.  Eyes: Negative for blurred vision.  Respiratory: Negative for cough, shortness of breath and wheezing.   Cardiovascular: Positive for chest pain.  Gastrointestinal: Positive for abdominal pain and nausea. Negative for constipation, diarrhea and vomiting.  Genitourinary: Negative for dysuria.  Musculoskeletal: Negative for joint pain.  Neurological: Negative for dizziness and headaches.   Exam: Physical Exam  Constitutional: He is oriented to person, place, and time.  HENT:  Nose: No mucosal edema.  Mouth/Throat: No oropharyngeal exudate or posterior oropharyngeal edema.  Eyes: Pupils are equal, round, and reactive to light. Conjunctivae, EOM and lids are normal.  Neck: No JVD present. Carotid bruit is not present. No edema present. No thyroid mass and no thyromegaly present.  Cardiovascular: S1 normal and S2 normal. Exam reveals no gallop.  No murmur heard. Pulses:      Dorsalis pedis pulses are 2+ on the right side and 2+ on the left side.  Respiratory: No respiratory distress. He has no wheezes. He has no rhonchi. He has no rales.  GI: Soft. Bowel sounds are normal. There is abdominal tenderness in the right upper quadrant, epigastric area and left upper quadrant.  Musculoskeletal:     Right ankle: He exhibits no swelling.      Left ankle: He exhibits no swelling.  Lymphadenopathy:    He has no cervical adenopathy.  Neurological: He is alert and oriented to person, place, and time. No cranial nerve deficit.  Skin: Skin is warm. No rash noted. Nails show no clubbing.  Psychiatric: He has a normal mood and affect.      Data Reviewed: Basic Metabolic Panel: Recent Labs  Lab 06/22/19 2359 06/23/19 0524  NA 137 139  K 3.6 3.8  CL 103 105  CO2 23 22  GLUCOSE 92 78  BUN 11 9  CREATININE 0.70 0.72  CALCIUM 9.3 8.8*   Liver Function Tests: Recent Labs  Lab 06/22/19 2359  AST 113*  ALT 78*  ALKPHOS 94  BILITOT 1.2  PROT 8.0  ALBUMIN 4.2   Recent Labs  Lab 06/22/19 2359 06/24/19 0453  LIPASE 378* 1,020*   CBC: Recent Labs  Lab 06/22/19 2359 06/23/19 0524  WBC 4.3 5.0  HGB 16.3 15.8  HCT 46.0 45.6  MCV 87.1 89.4  PLT 153 147*     Recent Results (from the past 240 hour(s))  SARS Coronavirus 2 [facility] order, Performed in [facility] hospital lab) Nasopharyngeal Nasopharyngeal Swab     Status: None   Collection Time: 06/23/19  3:45 AM   Specimen: Nasopharyngeal Swab  Result Value Ref Range Status   SARS Coronavirus 2 NEGATIVE NEGATIVE Final    Comment: (NOTE) If result is NEGATIVE SARS-CoV-2 target nucleic acids are NOT DETECTED. The SARS-CoV-2 RNA is generally  detectable in upper and lower  respiratory specimens during the acute phase of infection. The lowest  concentration of SARS-CoV-2 viral copies this assay can detect is 250  copies / mL. A negative result does not preclude SARS-CoV-2 infection  and should not be used as the sole basis for treatment or other  patient management decisions.  A negative result may occur with  improper specimen collection / handling, submission of specimen other  than nasopharyngeal swab, presence of viral mutation(s) within the  areas targeted by this assay, and inadequate number of viral copies  (<250 copies / mL). A negative result must be  combined with clinical  observations, patient history, and epidemiological information. If result is POSITIVE SARS-CoV-2 target nucleic acids are DETECTED. The SARS-CoV-2 RNA is generally detectable in upper and lower  respiratory specimens dur ing the acute phase of infection.  Positive  results are indicative of active infection with SARS-CoV-2.  Clinical  correlation with patient history and other diagnostic information is  necessary to determine patient infection status.  Positive results do  not rule out bacterial infection or co-infection with other viruses. If result is PRESUMPTIVE POSTIVE SARS-CoV-2 nucleic acids MAY BE PRESENT.   A presumptive positive result was obtained on the submitted specimen  and confirmed on repeat testing.  While 2019 novel coronavirus  (SARS-CoV-2) nucleic acids may be present in the submitted sample  additional confirmatory testing may be necessary for epidemiological  and / or clinical management purposes  to differentiate between  SARS-CoV-2 and other Sarbecovirus currently known to infect humans.  If clinically indicated additional testing with an alternate test  methodology [id number]) is advised. The SARS-CoV-2 RNA is generally  detectable in upper and lower respiratory sp ecimens during the acute  phase of infection. The expected result is Negative. Fact Sheet for Patients:  BoilerBrush.com.cy Fact Sheet for Healthcare Providers: https://pope.com/ This test is not yet approved or cleared by the [place] FDA and has been authorized for detection and/or diagnosis of SARS-CoV-2 by FDA under an Emergency Use Authorization (EUA).  This EUA will remain in effect (meaning this test can be used) for the duration of the COVID-19 declaration under Section 564(b)(1) of the Act, 21 U.S.C. section 360bbb-3(b)(1), unless the authorization is terminated or revoked sooner. Performed at [facility], [address], [place], [place] [address]      Studies: Ct Abdomen Pelvis W Contrast  Result Date: 06/23/2019 CLINICAL DATA:  48 year old male with upper abdominal pain. EXAM: CT ABDOMEN AND PELVIS WITH CONTRAST TECHNIQUE: Multidetector CT imaging of the abdomen and pelvis was performed using the standard protocol following bolus administration of intravenous contrast. CONTRAST:  OMNIPAQUE IOHEXOL 300 MG/ML  SOLN COMPARISON:  CT of the abdomen pelvis dated 05/02/2012 FINDINGS: Lower chest: The visualized lung bases are clear. No intra-abdominal free air or free fluid. Hepatobiliary: Diffuse fatty infiltration of the liver. No intrahepatic biliary ductal dilatation. The gallbladder is unremarkable. Pancreas: There is diffuse inflammatory changes of the pancreas consistent with acute pancreatitis. Ill-defined focal hypodensity in the uncinate process of the pancreas may represent an area of edema or necrosis. No drainable fluid collection/abscess or pseudocyst identified at this time. Spleen: Normal in size without focal abnormality. Adrenals/Urinary Tract: The adrenal glands are unremarkable. There is no hydronephrosis on either side. There is symmetric enhancement and excretion of contrast by both kidneys. The visualized ureters and urinary bladder appear unremarkable. Stomach/Bowel: The stomach is distended. There is diffuse inflammatory changes and thickening of the  proximal duodenum secondary to inflammatory changes of the pancreas. There is probable associated luminal narrowing and resulting in a degree of gastric outlet obstruction and distention of the stomach. The appendix is normal. Vascular/Lymphatic: The abdominal aorta and IVC are unremarkable. The splenic vein, SMV, and main portal vein are patent. No portal venous gas. No adenopathy. Reproductive: The prostate and seminal vesicles are grossly unremarkable. Bilateral hydroceles partially visualized. Other: None Musculoskeletal: No  acute or significant osseous findings. Degenerative changes primarily at L4-L5 with grade 1 anterolisthesis. IMPRESSION: 1. Acute pancreatitis. Ill-defined focal hypodensity in the uncinate process of the pancreas may represent an area of edema or necrosis. No drainable fluid collection/abscess or pseudocyst. 2. Fatty liver. 3. Distended stomach with findings suggestive of degree of gastric outlet obstruction secondary to reactive inflammatory changes of the duodenum. Electronically Signed   By: Elgie Collard M.D.   On: 06/23/2019 01:17    Scheduled Meds: . docusate sodium  100 mg Oral BID  . enoxaparin (LOVENOX) injection  40 mg Subcutaneous Q24H  . feeding supplement  1 Container Oral TID BM  . folic acid  1 mg Oral Daily  . multivitamin with minerals  1 tablet Oral Daily  . pantoprazole (PROTONIX) IV  40 mg Intravenous Q12H  . sodium chloride flush  3 mL Intravenous Q12H  . thiamine  100 mg Oral Daily   Or  . thiamine  100 mg Intravenous Daily   Continuous Infusions: . sodium chloride    . ceFEPime (MAXIPIME) IV 2 g (06/24/19 1400)  . lactated ringers    . metronidazole 500 mg (06/24/19 1025)    Assessment/Plan:  1. Acute severe pancreatitis with edema and or necrosis seen on CT scan.  Continue vigorous IV fluid hydration.  Continue to monitor vitals.  Continue empiric antibiotic.  PRN pain and nausea medications.  Lipase trending upward. 2. Duodenal inflammation.  Give IV Protonix.  GI consultation. 3. Alcohol abuse.  Patient put on alcohol withdrawal protocol. 4. Paroxysmal atrial fibrillation.  Seen by cardiology.  Not a good candidate for anticoagulation or antiarrhythmics.  Medication stopped by cardiology. 5. Impaired fasting glucose.  Patient not a diabetic hemoglobin A1c 5.6 6. Borderline elevated troponin secondary to demand ischemia from acute pancreatitis.  No further work-up as per cardiology 7. History of hepatitis C  Code Status:     Code Status Orders  (From  admission, onward)         Start     Ordered   06/23/19 0331  Full code  Continuous     06/23/19 0331        Code Status History    Date Active Date Inactive Code Status Order ID Comments User Context   09/13/2018 2100 09/14/2018 1851 Full Code [id number]  Auburn Bilberry, MD Inpatient   03/19/2018 2027 03/20/2018 1444 Full Code [id number]  Adrian Saran, MD Inpatient   Advance Care Planning Activity      Disposition Plan: To be determined  Consultants:  Cardiology  Gastroenterology  Time spent: 28 minutes  Zanaiya Calabria [employer]

## 2019-06-24 NOTE — Progress Notes (Signed)
Initial Nutrition Assessment  DOCUMENTATION CODES:   Not applicable  INTERVENTION:  Provide Boost Breeze po TID, each supplement provides 250 kcal and 9 grams of protein.  Provided "Pancreatitis Nutrition Therapy" and "Pancreatitis Label Reading Tips" handouts from the Academy of Nutrition and Dietetics. Discussed patient will be advanced as tolerated to a low-fat diet.   NUTRITION DIAGNOSIS:   Inadequate oral intake related to decreased appetite as evidenced by per patient/family report.  GOAL:   Patient will meet greater than or equal to 90% of their needs  MONITOR:   PO intake, Supplement acceptance, Diet advancement, Labs, Weight trends, I & O's  REASON FOR ASSESSMENT:   Malnutrition Screening Tool    ASSESSMENT:   48 year old male with PMHx of hepatitis C, paroxysmal A-fib, polysubstance abuse (cocaine, tobacco, EtOH) admitted with alcoholic pancreatitis, also with concern for possible gastric outlet obstruction.   Met with patient at bedside. He reports he has had a decreased appetite for the past 3 days. He has been experiencing abdominal pain and nausea. He reports he has a good appetite and intake at baseline. He has had some water today. He tried to eat an [place] ice and then developed N/V. He is amenable to trying Boost Breeze to see if he tolerates. He reports he has had pancreatitis before. Discussed patient's diet will be advanced as tolerated by MD to low-fat. He reports he has not learned about low-fat diet before and would like handouts.  Patient reports his UBW had been 160-170 lbs and he has lost weight. Per chart he was 77.1 kg on 04/18/2019. He is currently 70.1 kg (154.6 lbs). He has lost 7 kg (9.1% body weight) over the past month, which is significant for time frame.  Medications reviewed and include: Colace 573 mg BID, folic acid 1 mg daily, MVI daily, pantoprazole, thiamine 100 mg daily, cefepime, Flagyl.  Labs reviewed: Lipase 1020.  Patient does not  meet criteria for malnutrition at this time but is at risk for acute malnutrition.  NUTRITION - FOCUSED PHYSICAL EXAM:    Most Recent Value  Orbital Region  No depletion  Upper Arm Region  No depletion  Thoracic and Lumbar Region  No depletion  Buccal Region  No depletion  Temple Region  No depletion  Clavicle Bone Region  Mild depletion  Clavicle and Acromion Bone Region  No depletion  Scapular Bone Region  No depletion  Dorsal Hand  No depletion  Patellar Region  No depletion  Anterior Thigh Region  No depletion  Posterior Calf Region  No depletion  Edema (RD Assessment)  None  Hair  Reviewed  Eyes  Reviewed  Mouth  Reviewed  Skin  Reviewed  Nails  Reviewed     Diet Order:   Diet Order            Diet clear liquid Room service appropriate? Yes; Fluid consistency: Thin  Diet effective now             EDUCATION NEEDS:   Education needs have been addressed  Skin:  Skin Assessment: Reviewed RN Assessment  Last BM:  06/23/2019 per chart  Height:   Ht Readings from Last 1 Encounters:  06/22/19 5' 7"  (1.702 m)   Weight:   Wt Readings from Last 1 Encounters:  06/24/19 70.1 kg   Ideal Body Weight:  67.3 kg  BMI:  Body mass index is 24.21 kg/m.  Estimated Nutritional Needs:   Kcal:  2202-5427  Protein:  85-100 grams  Fluid:  1.8-2.1 L/day  Willey Blade, MS, RD, LDN Office: [phone] Pager: [phone] After Hours/Weekend Pager: [phone]

## 2019-06-25 DIAGNOSIS — D696 Thrombocytopenia, unspecified: Secondary | ICD-10-CM

## 2019-06-25 DIAGNOSIS — F1020 Alcohol dependence, uncomplicated: Secondary | ICD-10-CM

## 2019-06-25 DIAGNOSIS — K859 Acute pancreatitis without necrosis or infection, unspecified: Principal | ICD-10-CM

## 2019-06-25 DIAGNOSIS — I480 Paroxysmal atrial fibrillation: Secondary | ICD-10-CM

## 2019-06-25 DIAGNOSIS — R778 Other specified abnormalities of plasma proteins: Secondary | ICD-10-CM

## 2019-06-25 LAB — TROPONIN I (HIGH SENSITIVITY)
Troponin I (High Sensitivity): 49 ng/L — ABNORMAL HIGH (ref <18)
Troponin I (High Sensitivity): 49 ng/L — ABNORMAL HIGH (ref <18)

## 2019-06-25 LAB — CBC
HCT: 37.9 % — ABNORMAL LOW (ref 39.0–52.0)
Hemoglobin: 13.1 g/dL (ref 13.0–17.0)
MCH: 30.9 pg (ref 26.0–34.0)
MCHC: 34.6 g/dL (ref 30.0–36.0)
MCV: 89.4 fL (ref 80.0–100.0)
Platelets: 19 10*3/uL — CL (ref 150–400)
RBC: 4.24 MIL/uL (ref 4.22–5.81)
RDW: 14.1 % (ref 11.5–15.5)
WBC: 11.8 10*3/uL — ABNORMAL HIGH (ref 4.0–10.5)
nRBC: 0 % (ref 0.0–0.2)

## 2019-06-25 LAB — URINALYSIS, ROUTINE W REFLEX MICROSCOPIC
Bilirubin Urine: NEGATIVE
Glucose, UA: NEGATIVE mg/dL
Ketones, ur: 5 mg/dL — AB
Leukocytes,Ua: NEGATIVE
Nitrite: NEGATIVE
Protein, ur: 100 mg/dL — AB
Specific Gravity, Urine: 1.015 (ref 1.005–1.030)
pH: 6 (ref 5.0–8.0)

## 2019-06-25 LAB — CBC WITH DIFFERENTIAL/PLATELET
Abs Immature Granulocytes: 0.09 10*3/uL — ABNORMAL HIGH (ref 0.00–0.07)
Basophils Absolute: 0 10*3/uL (ref 0.0–0.1)
Basophils Relative: 0 %
Eosinophils Absolute: 0.1 10*3/uL (ref 0.0–0.5)
Eosinophils Relative: 1 %
HCT: 35.8 % — ABNORMAL LOW (ref 39.0–52.0)
Hemoglobin: 12.2 g/dL — ABNORMAL LOW (ref 13.0–17.0)
Immature Granulocytes: 1 %
Lymphocytes Relative: 20 %
Lymphs Abs: 2.4 10*3/uL (ref 0.7–4.0)
MCH: 31 pg (ref 26.0–34.0)
MCHC: 34.1 g/dL (ref 30.0–36.0)
MCV: 90.9 fL (ref 80.0–100.0)
Monocytes Absolute: 1.4 10*3/uL — ABNORMAL HIGH (ref 0.1–1.0)
Monocytes Relative: 12 %
Neutro Abs: 8 10*3/uL — ABNORMAL HIGH (ref 1.7–7.7)
Neutrophils Relative %: 66 %
Platelets: 18 10*3/uL — CL (ref 150–400)
RBC: 3.94 MIL/uL — ABNORMAL LOW (ref 4.22–5.81)
RDW: 14.1 % (ref 11.5–15.5)
WBC: 12 10*3/uL — ABNORMAL HIGH (ref 4.0–10.5)
nRBC: 0 % (ref 0.0–0.2)

## 2019-06-25 LAB — PROTIME-INR
INR: 1.4 — ABNORMAL HIGH (ref 0.8–1.2)
Prothrombin Time: 17.3 seconds — ABNORMAL HIGH (ref 11.4–15.2)

## 2019-06-25 LAB — COMPREHENSIVE METABOLIC PANEL
ALT: 32 U/L (ref 0–44)
AST: 75 U/L — ABNORMAL HIGH (ref 15–41)
Albumin: 3 g/dL — ABNORMAL LOW (ref 3.5–5.0)
Alkaline Phosphatase: 62 U/L (ref 38–126)
Anion gap: 6 (ref 5–15)
BUN: 18 mg/dL (ref 6–20)
CO2: 25 mmol/L (ref 22–32)
Calcium: 8.4 mg/dL — ABNORMAL LOW (ref 8.9–10.3)
Chloride: 106 mmol/L (ref 98–111)
Creatinine, Ser: 0.88 mg/dL (ref 0.61–1.24)
GFR calc Af Amer: >60 mL/min (ref >60)
GFR calc non Af Amer: >60 mL/min (ref >60)
Glucose, Bld: 94 mg/dL (ref 70–99)
Potassium: 3.4 mmol/L — ABNORMAL LOW (ref 3.5–5.1)
Sodium: 137 mmol/L (ref 135–145)
Total Bilirubin: 3.4 mg/dL — ABNORMAL HIGH (ref 0.3–1.2)
Total Protein: 6.5 g/dL (ref 6.5–8.1)

## 2019-06-25 LAB — HIV ANTIBODY (ROUTINE TESTING W REFLEX): HIV Screen 4th Generation wRfx: NONREACTIVE

## 2019-06-25 LAB — TYPE AND SCREEN
ABO/RH(D): A POS
Antibody Screen: NEGATIVE

## 2019-06-25 LAB — LIPASE, BLOOD: Lipase: 240 U/L — ABNORMAL HIGH (ref 11–51)

## 2019-06-25 LAB — APTT: aPTT: 38 seconds — ABNORMAL HIGH (ref 24–36)

## 2019-06-25 LAB — FIBRINOGEN: Fibrinogen: 227 mg/dL (ref 210–475)

## 2019-06-25 MED ORDER — SODIUM CHLORIDE 0.9% IV SOLUTION: Freq: Once | INTRAVENOUS | Status: DC

## 2019-06-25 MED ORDER — LEVOFLOXACIN IN D5W 500 MG/100ML IV SOLN
500.0000 mg | INTRAVENOUS | Status: DC
  Administered 2019-06-25: 500 mg via INTRAVENOUS
  Filled 2019-06-25 (×2): qty 100

## 2019-06-25 MED ORDER — SODIUM CHLORIDE 0.9% FLUSH
10.0000 mL | Freq: Two times a day (BID) | INTRAVENOUS | Status: DC
  Administered 2019-06-25 – 2019-06-26 (×4): 10 mL

## 2019-06-25 MED ORDER — SODIUM CHLORIDE 0.9% FLUSH: 10.0000 mL | INTRAVENOUS | Status: DC | PRN

## 2019-06-25 MED ORDER — FAMOTIDINE IN NACL 20-0.9 MG/50ML-% IV SOLN
20.0000 mg | Freq: Two times a day (BID) | INTRAVENOUS | Status: DC
  Administered 2019-06-25 – 2019-06-28 (×7): 20 mg via INTRAVENOUS
  Filled 2019-06-25 (×7): qty 50

## 2019-06-25 MED ORDER — ENSURE MAX PROTEIN PO LIQD
11.0000 [oz_av] | Freq: Two times a day (BID) | ORAL | Status: DC
  Administered 2019-06-25 – 2019-06-27 (×5): 11 [oz_av] via ORAL
  Filled 2019-06-25: qty 330

## 2019-06-25 MED ORDER — NITROGLYCERIN 2 % TD OINT
0.5000 [in_us] | TOPICAL_OINTMENT | Freq: Once | TRANSDERMAL | Status: AC
  Administered 2019-06-25: 0.5 [in_us] via TOPICAL
  Filled 2019-06-25: qty 1

## 2019-06-25 NOTE — Progress Notes (Signed)
[name], male   DOB: Feb 12, 1971, 48 y.o.   MRN: [id number]  [facility] PROGRESS NOTE  Bruce [name]:[id number] DOB: [birth year] DOA: 06/22/2019 PCP: Patient, No Pcp Per  HPI/Subjective: Patient's abdominal pain is better today than it was when he came in.  Had some blood in the urine when he came in but that seems to be clearing up.  Urinating frequently but only small amounts.  No bleeding from anywhere as per the patient.  Came to see the patient early this morning because platelet count came down.  Objective: Vitals:   06/25/19 0309 06/25/19 0801  BP: (!) 156/95 (!) 149/83  Pulse: 64 63  Resp: 18 19  Temp: 99.6 F (37.6 C) 99.6 F (37.6 C)  SpO2: 98% 95%    Filed Weights   06/22/19 2313 06/24/19 0500 06/25/19 0306  Weight: 74.8 kg 70.1 kg 74.6 kg    ROS: Review of Systems  Constitutional: Negative for chills and fever.  Eyes: Negative for blurred vision.  Respiratory: Negative for cough, shortness of breath and wheezing.   Cardiovascular: Positive for chest pain.  Gastrointestinal: Positive for abdominal pain and nausea. Negative for constipation, diarrhea and vomiting.  Genitourinary: Negative for dysuria.  Musculoskeletal: Negative for joint pain.  Neurological: Negative for dizziness and headaches.   Exam: Physical Exam  Constitutional: He is oriented to person, place, and time.  HENT:  Nose: No mucosal edema.  Mouth/Throat: No oropharyngeal exudate or posterior oropharyngeal edema.  Eyes: Pupils are equal, round, and reactive to light. Conjunctivae, EOM and lids are normal.  Neck: No JVD present. Carotid bruit is not present. No edema present. No thyroid mass and no thyromegaly present.  Cardiovascular: S1 normal and S2 normal. Exam reveals no gallop.  No murmur heard. Pulses:      Dorsalis pedis pulses are 2+ on the right side and 2+ on the left side.  Respiratory: No respiratory distress. He has no wheezes. He has no rhonchi. He has no  rales.  GI: Soft. Bowel sounds are normal. There is abdominal tenderness in the right upper quadrant, epigastric area and left upper quadrant.  Musculoskeletal:     Right ankle: He exhibits no swelling.     Left ankle: He exhibits no swelling.  Lymphadenopathy:    He has no cervical adenopathy.  Neurological: He is alert and oriented to person, place, and time. No cranial nerve deficit.  Skin: Skin is warm. No rash noted. Nails show no clubbing.  Psychiatric: He has a normal mood and affect.      Data Reviewed: Basic Metabolic Panel: Recent Labs  Lab 06/22/19 2359 06/23/19 0524 06/25/19 0226  NA 137 139 137  K 3.6 3.8 3.4*  CL 103 105 106  CO2 23 22 25   GLUCOSE 92 78 94  BUN 11 9 18   CREATININE 0.70 0.72 0.88  CALCIUM 9.3 8.8* 8.4*   Liver Function Tests: Recent Labs  Lab 06/22/19 2359 06/25/19 0226  AST 113* 75*  ALT 78* 32  ALKPHOS 94 62  BILITOT 1.2 3.4*  PROT 8.0 6.5  ALBUMIN 4.2 3.0*   Recent Labs  Lab 06/22/19 2359 06/24/19 0453 06/25/19 0226  LIPASE 378* 1,020* 240*   CBC: Recent Labs  Lab 06/22/19 2359 06/23/19 0524 06/25/19 0226 06/25/19 0747  WBC 4.3 5.0 11.8* 12.0*  NEUTROABS  --   --   --  8.0*  HGB 16.3 15.8 13.1 12.2*  HCT 46.0 45.6 37.9* 35.8*  MCV 87.1 89.4 89.4 90.9  PLT 153 147* 19* 18*     Recent Results (from the past 240 hour(s))  SARS Coronavirus 2 [facility] order, Performed in [facility] hospital lab) Nasopharyngeal Nasopharyngeal Swab     Status: None   Collection Time: 06/23/19  3:45 AM   Specimen: Nasopharyngeal Swab  Result Value Ref Range Status   SARS Coronavirus 2 NEGATIVE NEGATIVE Final    Comment: (NOTE) If result is NEGATIVE SARS-CoV-2 target nucleic acids are NOT DETECTED. The SARS-CoV-2 RNA is generally detectable in upper and lower  respiratory specimens during the acute phase of infection. The lowest  concentration of SARS-CoV-2 viral copies this assay can detect is 250  copies / mL. A negative result  does not preclude SARS-CoV-2 infection  and should not be used as the sole basis for treatment or other  patient management decisions.  A negative result may occur with  improper specimen collection / handling, submission of specimen other  than nasopharyngeal swab, presence of viral mutation(s) within the  areas targeted by this assay, and inadequate number of viral copies  (<250 copies / mL). A negative result must be combined with clinical  observations, patient history, and epidemiological information. If result is POSITIVE SARS-CoV-2 target nucleic acids are DETECTED. The SARS-CoV-2 RNA is generally detectable in upper and lower  respiratory specimens dur ing the acute phase of infection.  Positive  results are indicative of active infection with SARS-CoV-2.  Clinical  correlation with patient history and other diagnostic information is  necessary to determine patient infection status.  Positive results do  not rule out bacterial infection or co-infection with other viruses. If result is PRESUMPTIVE POSTIVE SARS-CoV-2 nucleic acids MAY BE PRESENT.   A presumptive positive result was obtained on the submitted specimen  and confirmed on repeat testing.  While 2019 novel coronavirus  (SARS-CoV-2) nucleic acids may be present in the submitted sample  additional confirmatory testing may be necessary for epidemiological  and / or clinical management purposes  to differentiate between  SARS-CoV-2 and other Sarbecovirus currently known to infect humans.  If clinically indicated additional testing with an alternate test  methodology [phone]) is advised. The SARS-CoV-2 RNA is generally  detectable in upper and lower respiratory sp ecimens during the acute  phase of infection. The expected result is Negative. Fact Sheet for Patients:  BoilerBrush.com.cy Fact Sheet for Healthcare Providers: https://pope.com/ This test is not yet approved or  cleared by the [place] FDA and has been authorized for detection and/or diagnosis of SARS-CoV-2 by FDA under an Emergency Use Authorization (EUA).  This EUA will remain in effect (meaning this test can be used) for the duration of the COVID-19 declaration under Section 564(b)(1) of the Act, 21 U.S.C. section 360bbb-3(b)(1), unless the authorization is terminated or revoked sooner. Performed at [facility], [address] [place], [place] [address]       Scheduled Meds: . sodium chloride   Intravenous Once  . docusate sodium  100 mg Oral BID  . feeding supplement  1 Container Oral TID BM  . folic acid  1 mg Oral Daily  . multivitamin with minerals  1 tablet Oral Daily  . sodium chloride flush  10-40 mL Intracatheter Q12H  . sodium chloride flush  3 mL Intravenous Q12H  . thiamine  100 mg Oral Daily   Or  . thiamine  100 mg Intravenous Daily   Continuous Infusions: . sodium chloride Stopped (06/24/19 2303)  . ceFEPime (MAXIPIME) IV 2 g (06/25/19 0742)  .  famotidine (PEPCID) IV    . lactated ringers 150 mL/hr at 06/25/19 0606  . metronidazole 500 mg (06/25/19 0740)    Assessment/Plan:  1. Acute severe pancreatitis with edema and or necrosis seen on CT scan.  Continue vigorous IV fluid hydration.  Continue to monitor vitals.  Continue empiric antibiotic.  PRN pain and nausea medications.  Lipase trending upward. 2. Acute drop in platelets.  Was 147,000 yesterday and dropped down to 19,000.  I repeated it because I did not believe the number and it is 18,000.  Peripheral smear and pathologist review pending.  Case discussed with Dr. Donneta Romberg oncology to see the patient.  Patient wants to hold off on platelet transfusion until he sees the hematologist.  Stop Lovenox.  Change Protonix over to Pepcid.  Continue antibiotic for right now. 3. Duodenal inflammation.  Change Protonix over to Pepcid.  Appreciate GI follow-up. 4. Alcohol abuse.  Patient put on alcohol  withdrawal protocol. 5. Paroxysmal atrial fibrillation.  Seen by cardiology.  Not a good candidate for anticoagulation or antiarrhythmics.  Medication stopped by cardiology. 6. Impaired fasting glucose.  Patient not a diabetic hemoglobin A1c 5.6 7. Borderline elevated troponin secondary to demand ischemia from acute pancreatitis.  No further work-up as per cardiology 8. History of hepatitis C  Code Status:     Code Status Orders  (From admission, onward)         Start     Ordered   06/23/19 0331  Full code  Continuous     06/23/19 0331        Code Status History    Date Active Date Inactive Code Status Order ID Comments User Context   09/13/2018 2100 09/14/2018 1851 Full Code [id number]  Auburn Bilberry, MD Inpatient   03/19/2018 2027 03/20/2018 1444 Full Code [id number]  Adrian Saran, MD Inpatient   Advance Care Planning Activity      Disposition Plan: To be determined  Consultants:  Cardiology  Gastroenterology  Hematology  Time spent: 28 minutes.  Case discussed with family at bedside.  Case discussed with hematology  Saryiah Bencosme [employer]

## 2019-06-25 NOTE — Progress Notes (Signed)
Progress Note  Patient Name: Bruce [name] Date of Encounter: 06/25/2019  Primary Cardiologist: Mariah MillingGollan  Subjective   Transient chest pain this morning; wonders if it could have been a-fib.  Abdomen still sore but improving.  Inpatient Medications    Scheduled Meds: . sodium chloride   Intravenous Once  . docusate sodium  100 mg Oral BID  . folic acid  1 mg Oral Daily  . multivitamin with minerals  1 tablet Oral Daily  . Ensure Max Protein  11 oz Oral BID  . sodium chloride flush  10-40 mL Intracatheter Q12H  . sodium chloride flush  3 mL Intravenous Q12H  . thiamine  100 mg Oral Daily   Or  . thiamine  100 mg Intravenous Daily   Continuous Infusions: . sodium chloride Stopped (06/24/19 2303)  . famotidine (PEPCID) IV 20 mg (06/25/19 2107)  . lactated ringers 150 mL/hr at 06/25/19 1955  . levofloxacin (LEVAQUIN) IV 500 mg (06/25/19 1627)  . metronidazole 500 mg (06/25/19 1459)   PRN Meds: sodium chloride, diphenhydrAMINE, LORazepam **OR** LORazepam, morphine injection, ondansetron **OR** ondansetron (ZOFRAN) IV, sodium chloride flush, sodium chloride flush   Vital Signs    Vitals:   06/25/19 0309 06/25/19 0801 06/25/19 1732 06/25/19 1944  BP: (!) 156/95 (!) 149/83 (!) 155/88 (!) 149/94  Pulse: 64 63 62 63  Resp: 18 19 16 17   Temp: 99.6 F (37.6 C) 99.6 F (37.6 C) 99.4 F (37.4 C) 98.6 F (37 C)  TempSrc: Oral Oral Oral Oral  SpO2: 98% 95% 94% 95%  Weight:      Height:        Intake/Output Summary (Last 24 hours) at 06/25/2019 2201 Last data filed at 06/25/2019 1144 Gross per 24 hour  Intake 1844.98 ml  Output 700 ml  Net 1144.98 ml   Last 3 Weights 06/25/2019 06/24/2019 06/22/2019  Weight (lbs) 164 lb 6.4 oz 154 lb 9.6 oz 165 lb  Weight (kg) 74.571 kg 70.126 kg 74.844 kg      Telemetry    Sinus rhythm with PAC's and PVC's - Personally Reviewed  ECG   No new tracing.  Physical Exam   GEN: No acute distress.   Neck: No JVD Cardiac: RRR, no  murmurs, rubs, or gallops.  Respiratory: Clear to auscultation bilaterally. GI: Firm with mild epigastric tenderness MS: No edema; No deformity. Neuro:  Nonfocal  Psych: Normal affect   Labs    High Sensitivity Troponin:   Recent Labs  Lab 06/22/19 2359 06/23/19 0204 06/23/19 0524 06/25/19 0226 06/25/19 0425  TROPONINIHS 25* 24* 22* 49* 49*      Cardiac EnzymesNo results for input(s): TROPONINI in the last 168 hours. No results for input(s): TROPIPOC in the last 168 hours.   Chemistry Recent Labs  Lab 06/22/19 2359 06/23/19 0524 06/25/19 0226  NA 137 139 137  K 3.6 3.8 3.4*  CL 103 105 106  CO2 23 22 25   GLUCOSE 92 78 94  BUN 11 9 18   CREATININE 0.70 0.72 0.88  CALCIUM 9.3 8.8* 8.4*  PROT 8.0  --  6.5  ALBUMIN 4.2  --  3.0*  AST 113*  --  75*  ALT 78*  --  32  ALKPHOS 94  --  62  BILITOT 1.2  --  3.4*  GFRNONAA >60 >60 >60  GFRAA >60 >60 >60  ANIONGAP 11 12 6      Hematology Recent Labs  Lab 06/23/19 0524 06/25/19 0226 06/25/19 0747  WBC 5.0 11.8*  12.0*  RBC 5.10 4.24 3.94*  HGB 15.8 13.1 12.2*  HCT 45.6 37.9* 35.8*  MCV 89.4 89.4 90.9  MCH 31.0 30.9 31.0  MCHC 34.6 34.6 34.1  RDW 13.0 14.1 14.1  PLT 147* 19* 18*    BNPNo results for input(s): BNP, PROBNP in the last 168 hours.   DDimer No results for input(s): DDIMER in the last 168 hours.   Radiology    No results found.  Cardiac Studies   Echo (06/25/2019):  1. The left ventricle has normal systolic function, with an ejection fraction of 55-60%. The cavity size was normal. Left ventricular diastolic parameters were normal.  2. The right ventricle has normal systolic function. The cavity was normal. There is no increase in right ventricular wall thickness. Right ventricular systolic pressure is normal with an estimated pressure of 20.6 mmHg.  3. No evidence of mitral valve stenosis.  4. The tricuspid valve is grossly normal.  5. The aortic valve is grossly normal. No stenosis of the  aortic valve.  6. The aorta is normal unless otherwise noted.  Patient Profile     48 y.o. male with h/o PAF admitted with acute pancreatitis.  Assessment & Plan    Elevated troponin: Most likely supply-demand mismatch in the setting of acute pancreatitis.  Echo normal without WMA.  Patient with history of medication non-compliance.  No further inpatient workup; could consider outpatient stress test once patient has recovered from acute pancreatitis and thrombocytopenia.Marland Kitchen  PAF: Maintaining sinus rhythm.  Patient reports being prescribed medications in the past but not taking them regularly.  CHADSVASC score = 0.  No indication for anticoagulation, particularly with marked thrombocytopenia.  If patient has recurrent a-fib with RVR, consider addition of beta-blocker, as BP allows.  Acute pancreatitis:  Per IM.  Thrombocytopenia: Marked drop in platelets noted; patient denies significant bleeding.  Avoid antiplatelet agents and anticoagulants at this time.  Further workup per IM and hematology.  [facility] will sign off.   Medication Recommendations:  No standing meds. Other recommendations (labs, testing, etc):  None Follow up as an outpatient:  ~1 month after d/c with Dr. Rockey Situ or APP.  For questions or updates, please contact [place] Please consult [email or web address] for contact info under [facility].     Signed, Nelva Bush, MD  06/25/2019, 10:01 PM

## 2019-06-25 NOTE — Plan of Care (Signed)

## 2019-06-25 NOTE — Consult Note (Signed)
[place] CONSULT NOTE  Patient Care Team: Patient, No Pcp Per as PCP - General (General Practice)  CHIEF COMPLAINTS/PURPOSE OF CONSULTATION: Severe thrombocytopenia.  HISTORY OF PRESENTING ILLNESS:  Bruce [name] 48 y.o.  male history of alcohol/polydrug substance abuse; and paroxysmal A. Fib-is currently admitted to hospital for acute pancreatitis thought to be from alcohol.   Patient was in the hospital for severe abdominal pain nausea vomiting.  Patient CT scan in the hospital showed changes suggestive of acute pancreatitis but no evidence of any abscess formation.  No hepatosplenomegaly.   Patient states that his abdominal pain is improved with the last few days.  His nausea is improved.    Patient denies any easy bruising.  Denies any bleeding.  Denies any chest pain or shortness of breath.  Patient last drank alcohol approximately 5 days ago.  Any previous hematologic problems.  Never had a bone marrow biopsy.   Review of Systems  Constitutional: Negative for chills, diaphoresis, fever, malaise/fatigue and weight loss.  HENT: Negative for nosebleeds and sore throat.   Eyes: Negative for double vision.  Respiratory: Negative for cough, hemoptysis, sputum production, shortness of breath and wheezing.   Cardiovascular: Negative for chest pain, palpitations, orthopnea and leg swelling.  Gastrointestinal: Positive for abdominal pain, heartburn and vomiting. Negative for blood in stool, constipation, diarrhea, melena and nausea.  Genitourinary: Negative for dysuria, frequency and urgency.  Musculoskeletal: Negative for back pain and joint pain.  Skin: Negative.  Negative for itching and rash.  Neurological: Negative for dizziness, tingling, focal weakness, weakness and headaches.  Endo/Heme/Allergies: Does not bruise/bleed easily.  Psychiatric/Behavioral: Negative for depression. The patient is not nervous/anxious and does not have insomnia.      MEDICAL HISTORY:   Past Medical History:  Diagnosis Date  . A-fib ([place])   . Dental caries   . Hepatitis C infection   . Iritis   . PAF (paroxysmal atrial fibrillation) ([place])    a. diagnosed 03/19/18; b. CHADS2VASc 0  . Polysubstance abuse ([place])    a. cocaine, tobacco, etoh  . Scrotal mass   . Tick borne fever   . Uveitis     SURGICAL HISTORY: Past Surgical History:  Procedure Laterality Date  . FINGER SURGERY     infection, middle left  . fractured jaw    . SHOULDER SURGERY     right    SOCIAL HISTORY: Social History   Socioeconomic History  . Marital status: Single    Spouse name: Not on file  . Number of children: 0  . Years of education: Not on file  . Highest education level: Not on file  Occupational History    Employer: [employer]  . Financial resource strain: Not hard at all  . Food insecurity    Worry: Never true    Inability: Never true  . Transportation needs    Medical: No    Non-medical: Yes  Tobacco Use  . Smoking status: Current Some Day Smoker    Packs/day: 0.25    Types: Cigarettes  . Smokeless tobacco: Never Used  . Tobacco comment: Per pt  " I only smoke cigarettes when I drink beer"  Substance and Sexual Activity  . Alcohol use: Yes    Comment: 6 pack per week  . Drug use: Yes    Types: IV, Cocaine    Comment: reports last time he used cocaine was thursday   . Sexual activity: Not on file  Lifestyle  . Physical  activity    Days per week: 7 days    Minutes per session: 30 min  . Stress: To some extent  Relationships  . Social connections    Talks on phone: More than three times a week    Gets together: More than three times a week    Attends religious service: More than 4 times per year    Active member of club or organization: No    Attends meetings of clubs or organizations: Never    Relationship status: Living with partner  . Intimate partner violence    Fear of current or ex partner: No    Emotionally abused: No    Physically  abused: No    Forced sexual activity: No  Other Topics Concern  . Not on file  Social History Narrative  . Not on file    FAMILY HISTORY: Family History  Problem Relation Age of Onset  . Drug abuse Father     ALLERGIES:  is allergic to penicillins.  MEDICATIONS:  Current Facility-Administered Medications  Medication Dose Route Frequency Provider Last Rate Last Dose  . 0.9 %  sodium chloride infusion (Manually program via Guardrails IV Fluids)   Intravenous Once Mansy, Jan A, MD      . 0.9 %  sodium chloride infusion  250 mL Intravenous PRN Seals, Theo Dills, NP   Stopped at 06/24/19 2303  . ceFEPIme (MAXIPIME) 2 g in sodium chloride 0.9 % 100 mL IVPB  2 g Intravenous Q8H [place], Abby K, RPH 200 mL/hr at 06/25/19 0742 2 g at 06/25/19 0742  . diphenhydrAMINE (BENADRYL) injection 25 mg  25 mg Intravenous Q6H PRN Mansy, Jan A, MD   25 mg at 06/24/19 0505  . docusate sodium (COLACE) capsule 100 mg  100 mg Oral BID Loletha Grayer, MD   100 mg at 06/25/19 7829  . famotidine (PEPCID) IVPB 20 mg premix  20 mg Intravenous Q12H Wieting, Richard, MD      . feeding supplement (BOOST / RESOURCE BREEZE) liquid 1 Container  1 Container Oral TID BM Wieting, Richard, MD      . folic acid (FOLVITE) tablet 1 mg  1 mg Oral Daily Seals, Angela H, NP   1 mg at 06/25/19 0820  . lactated ringers infusion   Intravenous Continuous Loletha Grayer, MD 150 mL/hr at 06/25/19 1300    . LORazepam (ATIVAN) tablet 1 mg  1 mg Oral Q6H PRN Mayer Camel, NP   1 mg at 06/25/19 5621   Or  . LORazepam (ATIVAN) injection 1 mg  1 mg Intravenous Q6H PRN Seals, Levada Dy H, NP      . metroNIDAZOLE (FLAGYL) IVPB 500 mg  500 mg Intravenous Q8H Wieting, Richard, MD 100 mL/hr at 06/25/19 0740 500 mg at 06/25/19 0740  . morphine 2 MG/ML injection 2 mg  2 mg Intravenous Q3H PRN Mansy, Jan A, MD   2 mg at 06/25/19 1255  . multivitamin with minerals tablet 1 tablet  1 tablet Oral Daily Seals, Theo Dills, NP   1 tablet at 06/25/19  3086  . ondansetron (ZOFRAN) tablet 4 mg  4 mg Oral Q6H PRN Seals, Levada Dy H, NP   4 mg at 06/24/19 0004   Or  . ondansetron (ZOFRAN) injection 4 mg  4 mg Intravenous Q6H PRN Gardiner Barefoot H, NP   4 mg at 06/25/19 0245  . sodium chloride flush (NS) 0.9 % injection 10-40 mL  10-40 mL Intracatheter PRN Loletha Grayer, MD      .  sodium chloride flush (NS) 0.9 % injection 10-40 mL  10-40 mL Intracatheter Q12H Loletha Grayer, MD   10 mL at 06/25/19 6213  . sodium chloride flush (NS) 0.9 % injection 3 mL  3 mL Intravenous Q12H Seals, Theo Dills, NP   3 mL at 06/24/19 2129  . sodium chloride flush (NS) 0.9 % injection 3 mL  3 mL Intravenous PRN Seals, Theo Dills, NP      . thiamine (VITAMIN B-1) tablet 100 mg  100 mg Oral Daily Seals, Angela H, NP   100 mg at 06/25/19 0820   Or  . thiamine (B-1) injection 100 mg  100 mg Intravenous Daily Gardiner Barefoot H, NP          .  PHYSICAL EXAMINATION:  Vitals:   06/25/19 0309 06/25/19 0801  BP: (!) 156/95 (!) 149/83  Pulse: 64 63  Resp: 18 19  Temp: 99.6 F (37.6 C) 99.6 F (37.6 C)  SpO2: 98% 95%   Filed Weights   06/22/19 2313 06/24/19 0500 06/25/19 0306  Weight: 165 lb (74.8 kg) 154 lb 9.6 oz (70.1 kg) 164 lb 6.4 oz (74.6 kg)    Physical Exam  Constitutional: He is oriented to person, place, and time and well-developed, well-nourished, and in no distress.  HENT:  Head: Normocephalic and atraumatic.  Mouth/Throat: Oropharynx is clear and moist. No oropharyngeal exudate.  Eyes: Pupils are equal, round, and reactive to light.  Neck: Normal range of motion. Neck supple.  Cardiovascular: Normal rate and regular rhythm.  Pulmonary/Chest: No respiratory distress. He has no wheezes.  Abdominal: Soft. Bowel sounds are normal. He exhibits no distension and no mass. There is no abdominal tenderness. There is no rebound and no guarding.  Tenderness in the epigastric/generalized.  No rigidity or guarding.  Musculoskeletal: Normal range of motion.         General: No tenderness or edema.  Neurological: He is alert and oriented to person, place, and time.  Skin: Skin is warm.  Psychiatric: Affect normal.     LABORATORY DATA:  I have reviewed the data as listed Lab Results  Component Value Date   WBC 12.0 (H) 06/25/2019   HGB 12.2 (L) 06/25/2019   HCT 35.8 (L) 06/25/2019   MCV 90.9 06/25/2019   PLT 18 (LL) 06/25/2019   Recent Labs    03/08/19 2149  04/18/19 1732 06/22/19 2359 06/23/19 0524 06/25/19 0226  NA  --    < >  --  137 139 137  K  --    < >  --  3.6 3.8 3.4*  CL  --    < >  --  103 105 106  CO2  --    < >  --  _0 GLUCOSE  --    < >  --  92 78 94  BUN  --    < >  --  _1 CREATININE  --    < >  --  0.70 0.72 0.88  CALCIUM  --    < >  --  9.3 8.8* 8.4*  GFRNONAA  --    < >  --  >60 >60 >60  GFRAA  --    < >  --  >60 >60 >60  PROT 8.2*  --  8.4* 8.0  --  6.5  ALBUMIN 3.9  --  4.3 4.2  --  3.0*  AST 72*  --  185* 113*  --  75*  ALT 67*  --  126* 78*  --  32  ALKPHOS 94  --  90 94  --  62  BILITOT 1.0  --  1.2 1.2  --  3.4*  BILIDIR 0.3*  --  0.4*  --   --   --   IBILI 0.7  --  0.8  --   --   --    < > = values in this interval not displayed.    RADIOGRAPHIC STUDIES: I have personally reviewed the radiological images as listed and agreed with the findings in the report. Ct Abdomen Pelvis W Contrast  Result Date: 06/23/2019 CLINICAL DATA:  48 year old male with upper abdominal pain. EXAM: CT ABDOMEN AND PELVIS WITH CONTRAST TECHNIQUE: Multidetector CT imaging of the abdomen and pelvis was performed using the standard protocol following bolus administration of intravenous contrast. CONTRAST:  159m OMNIPAQUE IOHEXOL 300 MG/ML  SOLN COMPARISON:  CT of the abdomen pelvis dated 05/02/2012 FINDINGS: Lower chest: The visualized lung bases are clear. No intra-abdominal free air or free fluid. Hepatobiliary: Diffuse fatty infiltration of the liver. No intrahepatic biliary ductal dilatation. The gallbladder is  unremarkable. Pancreas: There is diffuse inflammatory changes of the pancreas consistent with acute pancreatitis. Ill-defined focal hypodensity in the uncinate process of the pancreas may represent an area of edema or necrosis. No drainable fluid collection/abscess or pseudocyst identified at this time. Spleen: Normal in size without focal abnormality. Adrenals/Urinary Tract: The adrenal glands are unremarkable. There is no hydronephrosis on either side. There is symmetric enhancement and excretion of contrast by both kidneys. The visualized ureters and urinary bladder appear unremarkable. Stomach/Bowel: The stomach is distended. There is diffuse inflammatory changes and thickening of the proximal duodenum secondary to inflammatory changes of the pancreas. There is probable associated luminal narrowing and resulting in a degree of gastric outlet obstruction and distention of the stomach. The appendix is normal. Vascular/Lymphatic: The abdominal aorta and IVC are unremarkable. The splenic vein, SMV, and main portal vein are patent. No portal venous gas. No adenopathy. Reproductive: The prostate and seminal vesicles are grossly unremarkable. Bilateral hydroceles partially visualized. Other: None Musculoskeletal: No acute or significant osseous findings. Degenerative changes primarily at L4-L5 with grade 1 anterolisthesis. IMPRESSION: 1. Acute pancreatitis. Ill-defined focal hypodensity in the uncinate process of the pancreas may represent an area of edema or necrosis. No drainable fluid collection/abscess or pseudocyst. 2. Fatty liver. 3. Distended stomach with findings suggestive of degree of gastric outlet obstruction secondary to reactive inflammatory changes of the duodenum. Electronically Signed   By: AAnner CreteM.D.   On: 06/23/2019 01:17    Thrombocytopenia ([facility] # 48year old male patient history of alcoholism is currently admitted to hospital for acute alcohol induced pancreatitis-noted to have  significant thrombocytopenia.  #Severe thrombocytopenia-etiology unclear-multifactorial-EtOH abuse/pancreatitis.  Question antibiotics.  Clinically not suggestive of HIT; 4 T score-low probability.  Agree with holding Lovenox for now.  Check DIC panel/PT PTT; heparin platelet for factor antibody.  Would not recommend platelet transfusion unless clinically bleeding or less than 10,000.  I think patient's platelet should rebound in the next 2 to 3 days.  #Acute alcohol induced pancreatitis-clinically improving/lipase improving.  Followed by gastroenterology.  #Alcoholism-discussed/counseled regarding alcohol cessation.   Thank you Dr.Wieting for allowing me to participate in the care of your pleasant patient. Please do not hesitate to contact me with questions or concerns in the interim.   All questions were answered. The patient knows to call the clinic with any problems, questions or concerns.  Cammie Sickle, MD 06/25/2019 1:20 PM

## 2019-06-25 NOTE — Progress Notes (Signed)
Notified Dr. Sidney Ace about critical platelet of 19 from lab. Mansy gave order for 2 units of platelets and type and screen.

## 2019-06-25 NOTE — Assessment & Plan Note (Addendum)
#   48 year old male patient history of alcoholism is currently admitted to hospital for acute alcohol induced pancreatitis-noted to have severe thrombocytopenia [[place]-18]  #Severe thrombocytopenia-etiology unclear-multifactorial-EtOH abuse/pancreatitis.  Patient's platelets significantly improving 83 from nadir of 18 2 days ago.  No evidence of bleeding.  #Acute alcohol induced pancreatitis-clinically improving/lipase improving.  Followed by gastroenterology.  #Left wrist pain-question etiology recommend ice packs.  #Alcoholism-discussed/counseled regarding alcohol cessation.    #Patient can be discharged from hematology standpoint; recommend CBC in approximately 2 weeks with PCP.  Follow-up with hematology as needed.

## 2019-06-25 NOTE — Progress Notes (Signed)
Bruce [name] , MD [address] [place], [place], [place], [address] [address], [place], [place], [place], [address] Phone: [phone]  Fax: [phone]   Bruce [name] is being followed for acute pancreatitis   Subjective: Pain better, tolerating liquids, wants to eat    Objective: Vital signs in last 24 hours: Vitals:   06/25/19 0149 06/25/19 0306 06/25/19 0309 06/25/19 0801  BP: (!) 146/79  (!) 156/95 (!) 149/83  Pulse: (!) 56  64 63  Resp: 16  18 19   Temp: 98.1 F (36.7 C)  99.6 F (37.6 C) 99.6 F (37.6 C)  TempSrc: Oral  Oral Oral  SpO2: 99%  98% 95%  Weight:  74.6 kg    Height:       Weight change: 4.445 kg  Intake/Output Summary (Last 24 hours) at 06/25/2019 1136 Last data filed at 06/25/2019 0345 Gross per 24 hour  Intake 1844.98 ml  Output 1050 ml  Net 794.98 ml     Exam: Heart:: Regular rate and rhythm, S1S2 present or without murmur or extra heart sounds Lungs: normal, clear to auscultation and clear to auscultation and percussion Abdomen: soft, mild epigastric tenderness , normal bowel sounds   Lab Results: @LABTEST2 @ Micro Results: Recent Results (from the past 240 hour(s))  SARS Coronavirus 2 [facility] order, Performed in [place] hospital lab) Nasopharyngeal Nasopharyngeal Swab     Status: None   Collection Time: 06/23/19  3:45 AM   Specimen: Nasopharyngeal Swab  Result Value Ref Range Status   SARS Coronavirus 2 NEGATIVE NEGATIVE Final    Comment: (NOTE) If result is NEGATIVE SARS-CoV-2 target nucleic acids are NOT DETECTED. The SARS-CoV-2 RNA is generally detectable in upper and lower  respiratory specimens during the acute phase of infection. The lowest  concentration of SARS-CoV-2 viral copies this assay can detect is 250  copies / mL. A negative result does not preclude SARS-CoV-2 infection  and should not be used as the sole basis for treatment or other  patient management decisions.  A negative result may occur with   improper specimen collection / handling, submission of specimen other  than nasopharyngeal swab, presence of viral mutation(s) within the  areas targeted by this assay, and inadequate number of viral copies  (<250 copies / mL). A negative result must be combined with clinical  observations, patient history, and epidemiological information. If result is POSITIVE SARS-CoV-2 target nucleic acids are DETECTED. The SARS-CoV-2 RNA is generally detectable in upper and lower  respiratory specimens dur ing the acute phase of infection.  Positive  results are indicative of active infection with SARS-CoV-2.  Clinical  correlation with patient history and other diagnostic information is  necessary to determine patient infection status.  Positive results do  not rule out bacterial infection or co-infection with other viruses. If result is PRESUMPTIVE POSTIVE SARS-CoV-2 nucleic acids MAY BE PRESENT.   A presumptive positive result was obtained on the submitted specimen  and confirmed on repeat testing.  While 2019 novel coronavirus  (SARS-CoV-2) nucleic acids may be present in the submitted sample  additional confirmatory testing may be necessary for epidemiological  and / or clinical management purposes  to differentiate between  SARS-CoV-2 and other Sarbecovirus currently known to infect humans.  If clinically indicated additional testing with an alternate test  methodology [phone]) is advised. The SARS-CoV-2 RNA is generally  detectable in upper and lower respiratory sp ecimens during the acute  phase of infection. The expected result is Negative. Fact Sheet  for Patients:  BoilerBrush.com.cyhttps://www.fda.gov/media/136312/download Fact Sheet for Healthcare Providers: https://pope.com/https://www.fda.gov/media/136313/download This test is not yet approved or cleared by the [place] FDA and has been authorized for detection and/or diagnosis of SARS-CoV-2 by FDA under an Emergency Use Authorization (EUA).  This EUA will  remain in effect (meaning this test can be used) for the duration of the COVID-19 declaration under Section 564(b)(1) of the Act, 21 U.S.C. section 360bbb-3(b)(1), unless the authorization is terminated or revoked sooner. Performed at [facility], [address] [place], [place] [address]    Studies/Results: No results found. Medications: I have reviewed the patient's current medications. Scheduled Meds: . sodium chloride   Intravenous Once  . docusate sodium  100 mg Oral BID  . feeding supplement  1 Container Oral TID BM  . folic acid  1 mg Oral Daily  . multivitamin with minerals  1 tablet Oral Daily  . sodium chloride flush  10-40 mL Intracatheter Q12H  . sodium chloride flush  3 mL Intravenous Q12H  . thiamine  100 mg Oral Daily   Or  . thiamine  100 mg Intravenous Daily   Continuous Infusions: . sodium chloride Stopped (06/24/19 2303)  . ceFEPime (MAXIPIME) IV 2 g (06/25/19 0742)  . famotidine (PEPCID) IV    . lactated ringers 150 mL/hr at 06/25/19 0606  . metronidazole 500 mg (06/25/19 0740)   PRN Meds:.sodium chloride, diphenhydrAMINE, LORazepam **OR** LORazepam, morphine injection, ondansetron **OR** ondansetron (ZOFRAN) IV, sodium chloride flush, sodium chloride flush   Assessment: Active Problems:   Pancreatitis  Bruce [name] 48 y.o. male with a history of polysubstance, alcohol use.  Admitted on 06/22/2019 with alcoholic pancreatitis.  Initially on CT scan concern for possible gastric outlet obstruction.  Has been taking 3-4 Aleve daily for a month.  Urine positive for cocaine.  No intrahepatic biliary ductal dilation on CT scan of the abdomen.  Plan: 1.  Stop all alcohol and illegal drug use. 2.  IV Ringer lactate  3.  soft low fat diet  4.  If develops worsening abdominal pain and tenderness on examination may need to consider repeat imaging to rule out necrosis. 5.  Stop all NSAID use.  There is some concern for gastric outlet obstruction on the  CAT scan which may be due to edema from all the pancreatitis.  We can follow him clinically if he is able to tolerate liquids then we could consider evaluation as an outpatient that he has had an acute episode of pancreatitis and in addition he has cocaine in his system which would be a contraindication for anesthesia at this point of time. 6. PPI     LOS: 2 days   Wyline MoodKiran Dewayne Severe, MD 06/25/2019, 11:36 AM

## 2019-06-26 DIAGNOSIS — K852 Alcohol induced acute pancreatitis without necrosis or infection: Secondary | ICD-10-CM

## 2019-06-26 LAB — BASIC METABOLIC PANEL
Anion gap: 8 (ref 5–15)
BUN: 15 mg/dL (ref 6–20)
CO2: 25 mmol/L (ref 22–32)
Calcium: 8.5 mg/dL — ABNORMAL LOW (ref 8.9–10.3)
Chloride: 101 mmol/L (ref 98–111)
Creatinine, Ser: 0.79 mg/dL (ref 0.61–1.24)
GFR calc Af Amer: >60 mL/min (ref >60)
GFR calc non Af Amer: >60 mL/min (ref >60)
Glucose, Bld: 109 mg/dL — ABNORMAL HIGH (ref 70–99)
Potassium: 3 mmol/L — ABNORMAL LOW (ref 3.5–5.1)
Sodium: 134 mmol/L — ABNORMAL LOW (ref 135–145)

## 2019-06-26 LAB — BPAM PLATELET PHERESIS
Blood Product Expiration Date: 202008202359
Unit Type and Rh: 6200

## 2019-06-26 LAB — PREPARE PLATELET PHERESIS: Unit division: 0

## 2019-06-26 LAB — CBC
HCT: 31.2 % — ABNORMAL LOW (ref 39.0–52.0)
Hemoglobin: 11 g/dL — ABNORMAL LOW (ref 13.0–17.0)
MCH: 31 pg (ref 26.0–34.0)
MCHC: 35.3 g/dL (ref 30.0–36.0)
MCV: 87.9 fL (ref 80.0–100.0)
Platelets: 23 10*3/uL — CL (ref 150–400)
RBC: 3.55 MIL/uL — ABNORMAL LOW (ref 4.22–5.81)
RDW: 13.9 % (ref 11.5–15.5)
WBC: 9.4 10*3/uL (ref 4.0–10.5)
nRBC: 0.2 % (ref 0.0–0.2)

## 2019-06-26 LAB — HEPARIN INDUCED PLATELET AB (HIT ANTIBODY): Heparin Induced Plt Ab: 0.075 OD (ref 0.000–0.400)

## 2019-06-26 MED ORDER — OXYCODONE HCL 5 MG PO TABS: 5.0000 mg | ORAL_TABLET | ORAL | Status: DC | PRN

## 2019-06-26 MED ORDER — POTASSIUM CHLORIDE CRYS ER 20 MEQ PO TBCR
40.0000 meq | EXTENDED_RELEASE_TABLET | Freq: Once | ORAL | Status: AC
  Administered 2019-06-26: 40 meq via ORAL
  Filled 2019-06-26: qty 2

## 2019-06-26 MED ORDER — TAMSULOSIN HCL 0.4 MG PO CAPS
0.4000 mg | ORAL_CAPSULE | Freq: Every day | ORAL | Status: DC
  Administered 2019-06-26 – 2019-06-28 (×3): 0.4 mg via ORAL
  Filled 2019-06-26 (×3): qty 1

## 2019-06-26 MED ORDER — METRONIDAZOLE 500 MG PO TABS
500.0000 mg | ORAL_TABLET | Freq: Three times a day (TID) | ORAL | Status: DC
  Administered 2019-06-26 – 2019-06-28 (×6): 500 mg via ORAL
  Filled 2019-06-26 (×8): qty 1

## 2019-06-26 MED ORDER — DIPHENHYDRAMINE HCL 25 MG PO CAPS
25.0000 mg | ORAL_CAPSULE | Freq: Four times a day (QID) | ORAL | Status: DC | PRN
  Administered 2019-06-26: 22:00:00 25 mg via ORAL
  Filled 2019-06-26: qty 1

## 2019-06-26 MED ORDER — LEVOFLOXACIN 500 MG PO TABS
500.0000 mg | ORAL_TABLET | ORAL | Status: DC
  Administered 2019-06-26 – 2019-06-27 (×2): 500 mg via ORAL
  Filled 2019-06-26 (×2): qty 1

## 2019-06-26 NOTE — Progress Notes (Signed)
[name], male   DOB: 05/03/1971, 48 y.o.   MRN: [id number]  [employer] PROGRESS NOTE  Bruce [name] YQI:[id number] DOB: 1971-10-23 DOA: 06/22/2019 PCP: Patient, No Pcp Per  HPI/Subjective: Patient still has some abdominal pain and some soreness.  Some nausea.  Still with some difficulty urinating only urinating small amounts each time.  Objective: Vitals:   06/26/19 0320 06/26/19 0823  BP: (!) 147/88 (!) 151/103  Pulse: (!) 59 63  Resp:    Temp: 98.8 F (37.1 C)   SpO2: 94% 99%    Filed Weights   06/24/19 0500 06/25/19 0306 06/26/19 0320  Weight: 70.1 kg 74.6 kg 74.8 kg    ROS: Review of Systems  Constitutional: Negative for chills and fever.  Eyes: Negative for blurred vision.  Respiratory: Negative for cough, shortness of breath and wheezing.   Cardiovascular: Positive for chest pain.  Gastrointestinal: Positive for abdominal pain and nausea. Negative for constipation, diarrhea and vomiting.  Genitourinary: Negative for dysuria.  Musculoskeletal: Negative for joint pain.  Neurological: Negative for dizziness and headaches.   Exam: Physical Exam  Constitutional: He is oriented to person, place, and time.  HENT:  Nose: No mucosal edema.  Mouth/Throat: No oropharyngeal exudate or posterior oropharyngeal edema.  Eyes: Pupils are equal, round, and reactive to light. Conjunctivae, EOM and lids are normal.  Neck: No JVD present. Carotid bruit is not present. No edema present. No thyroid mass and no thyromegaly present.  Cardiovascular: S1 normal and S2 normal. Exam reveals no gallop.  No murmur heard. Pulses:      Dorsalis pedis pulses are 2+ on the right side and 2+ on the left side.  Respiratory: No respiratory distress. He has no wheezes. He has no rhonchi. He has no rales.  GI: Soft. Bowel sounds are normal. There is abdominal tenderness in the right upper quadrant, epigastric area and left upper quadrant.  Rectal exam-patient's prostate slightly  enlarged.  No tenderness  Musculoskeletal:     Right ankle: He exhibits no swelling.     Left ankle: He exhibits no swelling.  Lymphadenopathy:    He has no cervical adenopathy.  Neurological: He is alert and oriented to person, place, and time. No cranial nerve deficit.  Skin: Skin is warm. No rash noted. Nails show no clubbing.  Psychiatric: He has a normal mood and affect.      Data Reviewed: Basic Metabolic Panel: Recent Labs  Lab 06/22/19 2359 06/23/19 0524 06/25/19 0226 06/26/19 0532  NA 137 139 137 134*  K 3.6 3.8 3.4* 3.0*  CL 103 105 106 101  CO2 23 22 25 25   GLUCOSE 92 78 94 109*  BUN 11 9 18 15   CREATININE 0.70 0.72 0.88 0.79  CALCIUM 9.3 8.8* 8.4* 8.5*   Liver Function Tests: Recent Labs  Lab 06/22/19 2359 06/25/19 0226  AST 113* 75*  ALT 78* 32  ALKPHOS 94 62  BILITOT 1.2 3.4*  PROT 8.0 6.5  ALBUMIN 4.2 3.0*   Recent Labs  Lab 06/22/19 2359 06/24/19 0453 06/25/19 0226  LIPASE 378* 1,020* 240*   CBC: Recent Labs  Lab 06/22/19 2359 06/23/19 0524 06/25/19 0226 06/25/19 0747 06/26/19 0532  WBC 4.3 5.0 11.8* 12.0* 9.4  NEUTROABS  --   --   --  8.0*  --   HGB 16.3 15.8 13.1 12.2* 11.0*  HCT 46.0 45.6 37.9* 35.8* 31.2*  MCV 87.1 89.4 89.4 90.9 87.9  PLT 153 147* 19* 18* 23*  Recent Results (from the past 240 hour(s))  SARS Coronavirus 2 [facility] order, Performed in [facility] hospital lab) Nasopharyngeal Nasopharyngeal Swab     Status: None   Collection Time: 06/23/19  3:45 AM   Specimen: Nasopharyngeal Swab  Result Value Ref Range Status   SARS Coronavirus 2 NEGATIVE NEGATIVE Final    Comment: (NOTE) If result is NEGATIVE SARS-CoV-2 target nucleic acids are NOT DETECTED. The SARS-CoV-2 RNA is generally detectable in upper and lower  respiratory specimens during the acute phase of infection. The lowest  concentration of SARS-CoV-2 viral copies this assay can detect is 250  copies / mL. A negative result does not preclude  SARS-CoV-2 infection  and should not be used as the sole basis for treatment or other  patient management decisions.  A negative result may occur with  improper specimen collection / handling, submission of specimen other  than nasopharyngeal swab, presence of viral mutation(s) within the  areas targeted by this assay, and inadequate number of viral copies  (<250 copies / mL). A negative result must be combined with clinical  observations, patient history, and epidemiological information. If result is POSITIVE SARS-CoV-2 target nucleic acids are DETECTED. The SARS-CoV-2 RNA is generally detectable in upper and lower  respiratory specimens dur ing the acute phase of infection.  Positive  results are indicative of active infection with SARS-CoV-2.  Clinical  correlation with patient history and other diagnostic information is  necessary to determine patient infection status.  Positive results do  not rule out bacterial infection or co-infection with other viruses. If result is PRESUMPTIVE POSTIVE SARS-CoV-2 nucleic acids MAY BE PRESENT.   A presumptive positive result was obtained on the submitted specimen  and confirmed on repeat testing.  While 2019 novel coronavirus  (SARS-CoV-2) nucleic acids may be present in the submitted sample  additional confirmatory testing may be necessary for epidemiological  and / or clinical management purposes  to differentiate between  SARS-CoV-2 and other Sarbecovirus currently known to infect humans.  If clinically indicated additional testing with an alternate test  methodology [phone]) is advised. The SARS-CoV-2 RNA is generally  detectable in upper and lower respiratory sp ecimens during the acute  phase of infection. The expected result is Negative. Fact Sheet for Patients:  BoilerBrush.com.cy Fact Sheet for Healthcare Providers: https://pope.com/ This test is not yet approved or cleared by the  [place] FDA and has been authorized for detection and/or diagnosis of SARS-CoV-2 by FDA under an Emergency Use Authorization (EUA).  This EUA will remain in effect (meaning this test can be used) for the duration of the COVID-19 declaration under Section 564(b)(1) of the Act, 21 U.S.C. section 360bbb-3(b)(1), unless the authorization is terminated or revoked sooner. Performed at [facility], [address] [place], [place] [address]       Scheduled Meds: . sodium chloride   Intravenous Once  . docusate sodium  100 mg Oral BID  . folic acid  1 mg Oral Daily  . levofloxacin  500 mg Oral Q24H  . metroNIDAZOLE  500 mg Oral Q8H  . multivitamin with minerals  1 tablet Oral Daily  . potassium chloride  40 mEq Oral Once  . Ensure Max Protein  11 oz Oral BID  . sodium chloride flush  10-40 mL Intracatheter Q12H  . sodium chloride flush  3 mL Intravenous Q12H  . tamsulosin  0.4 mg Oral Daily  . thiamine  100 mg Oral Daily   Continuous Infusions: . sodium chloride  Stopped (06/25/19 1439)  . famotidine (PEPCID) IV 20 mg (06/26/19 0849)  . lactated ringers 75 mL/hr at 06/26/19 1120    Assessment/Plan:  1. Acute severe pancreatitis with edema and or necrosis seen on CT scan.  Continue vigorous IV fluid hydration.  Continue to monitor vitals.  Continue empiric antibiotic.  PRN pain and nausea medication. Diet advanced by gastroenterology.  Check lipase again tomorrow morning. 2. Acute thrombocytopenia.  Appreciate hematology consultation.  Holding Lovenox, cefepime and Protonix.  Platelet count slightly coming up today to 23,000. 3. Duodenal inflammation.  Change Protonix over to Pepcid.  Appreciate GI follow-up. 4. Alcohol abuse.  Patient put on alcohol withdrawal protocol. 5. Paroxysmal atrial fibrillation.  Seen by cardiology.  Not a good candidate for anticoagulation or antiarrhythmics.  Medication stopped by cardiology. 6. Impaired fasting glucose.  Patient not a  diabetic hemoglobin A1c 5.6 7. Borderline elevated troponin secondary to demand ischemia from acute pancreatitis.  No further work-up as per cardiology. 8. History of hepatitis C 9. BPH start Flomax  Code Status:     Code Status Orders  (From admission, onward)         Start     Ordered   06/23/19 0331  Full code  Continuous     06/23/19 0331        Code Status History    Date Active Date Inactive Code Status Order ID Comments User Context   09/13/2018 2100 09/14/2018 1851 Full Code [id number]  Auburn Bilberry, MD Inpatient   03/19/2018 2027 03/20/2018 1444 Full Code [id number]  Adrian Saran, MD Inpatient   Advance Care Planning Activity      Disposition Plan: To be determined  Consultants:  Cardiology  Gastroenterology  Hematology  Time spent: 27 minutes.  Case discussed with family at bedside.  Takila Kronberg [employer]

## 2019-06-26 NOTE — Progress Notes (Signed)
Bruce [name]   DOB:[birth year]   ZO#:[id number]    Subjective: Patient's nausea improving.  There is tolerating soft diet.  Denies any blood in stools or black-colored stools.  Denies any [name] bleeding/nosebleeds.  Objective:  Vitals:   06/26/19 0320 06/26/19 0823  BP: (!) 147/88 (!) 151/103  Pulse: (!) 59 63  Resp:    Temp: 98.8 F (37.1 C)   SpO2: 94% 99%     Intake/Output Summary (Last 24 hours) at 06/26/2019 1659 Last data filed at 06/26/2019 1123 Gross per 24 hour  Intake 4322.98 ml  Output 325 ml  Net 3997.98 ml    Physical Exam  Constitutional: He is oriented to person, place, and time and well-developed, well-nourished, and in no distress.  HENT:  Head: Normocephalic and atraumatic.  Mouth/Throat: Oropharynx is clear and moist. No oropharyngeal exudate.  Eyes: Pupils are equal, round, and reactive to light.  Neck: Normal range of motion. Neck supple.  Cardiovascular: Normal rate and regular rhythm.  Pulmonary/Chest: Effort normal and breath sounds normal. No respiratory distress. He has no wheezes.  Abdominal: Soft. Bowel sounds are normal. He exhibits no distension and no mass. There is no abdominal tenderness. There is no rebound and no guarding.  Mild epigastric discomfort noted.  Musculoskeletal: Normal range of motion.        General: No tenderness or edema.  Neurological: He is alert and oriented to person, place, and time.  Skin: Skin is warm.  Psychiatric: Affect normal.     Labs:  Lab Results  Component Value Date   WBC 9.4 06/26/2019   HGB 11.0 (L) 06/26/2019   HCT 31.2 (L) 06/26/2019   MCV 87.9 06/26/2019   PLT 23 (LL) 06/26/2019   NEUTROABS 8.0 (H) 06/25/2019    Lab Results  Component Value Date   NA 134 (L) 06/26/2019   K 3.0 (L) 06/26/2019   CL 101 06/26/2019   CO2 25 06/26/2019    Studies:  No results found.  Thrombocytopenia [facility]) # 48 year old male patient history of alcoholism is currently admitted to hospital for acute alcohol  induced pancreatitis-noted to have severe thrombocytopenia [Nadir-18]  #Severe thrombocytopenia-etiology unclear-multifactorial-EtOH abuse/pancreatitis.  Less likely/antibiotics or HIT.  Patient's platelets slightly improving at 23 from nadir of 18 yesterday.  No evidence of DIC noted.  Hold off platelet transfusion unless less than 10,000 or patient is bleeding.  #Acute alcohol induced pancreatitis-clinically improving/lipase-improving followed by gastroenterology.  #Alcoholism-discussed/counseled regarding alcohol cessation.    Cammie Sickle, MD 06/26/2019  4:59 PM

## 2019-06-26 NOTE — Progress Notes (Signed)
Bruce [name] , MD [address] [place], [place], [place], [address] [address], [place], [place], [place], [address] Phone: [phone]  Fax: [phone]   Bruce [name] is being followed for acute pancreatitis   Subjective: Feels better, tolerating liquids well   Objective: Vital signs in last 24 hours: Vitals:   06/25/19 1944 06/26/19 0013 06/26/19 0320 06/26/19 0823  BP: (!) 149/94 (!) 150/80 (!) 147/88 (!) 151/103  Pulse: 63 63 (!) 59 63  Resp: 17     Temp: 98.6 F (37 C)  98.8 F (37.1 C)   TempSrc: Oral  Oral   SpO2: 95%  94% 99%  Weight:   74.8 kg   Height:       Weight change: 0.272 kg  Intake/Output Summary (Last 24 hours) at 06/26/2019 1043 Last data filed at 06/26/2019 2951 Gross per 24 hour  Intake 4322.98 ml  Output 300 ml  Net 4022.98 ml     Exam: Heart:: Regular rate and rhythm, S1S2 present or without murmur or extra heart sounds Lungs: normal, clear to auscultation and clear to auscultation and percussion Abdomen: soft, nontender, normal bowel sounds   Lab Results: @LABTEST2 @ Micro Results: Recent Results (from the past 240 hour(s))  SARS Coronavirus 2 [facility] order, Performed in [place] hospital lab) Nasopharyngeal Nasopharyngeal Swab     Status: None   Collection Time: 06/23/19  3:45 AM   Specimen: Nasopharyngeal Swab  Result Value Ref Range Status   SARS Coronavirus 2 NEGATIVE NEGATIVE Final    Comment: (NOTE) If result is NEGATIVE SARS-CoV-2 target nucleic acids are NOT DETECTED. The SARS-CoV-2 RNA is generally detectable in upper and lower  respiratory specimens during the acute phase of infection. The lowest  concentration of SARS-CoV-2 viral copies this assay can detect is 250  copies / mL. A negative result does not preclude SARS-CoV-2 infection  and should not be used as the sole basis for treatment or other  patient management decisions.  A negative result may occur with  improper specimen collection / handling,  submission of specimen other  than nasopharyngeal swab, presence of viral mutation(s) within the  areas targeted by this assay, and inadequate number of viral copies  (<250 copies / mL). A negative result must be combined with clinical  observations, patient history, and epidemiological information. If result is POSITIVE SARS-CoV-2 target nucleic acids are DETECTED. The SARS-CoV-2 RNA is generally detectable in upper and lower  respiratory specimens dur ing the acute phase of infection.  Positive  results are indicative of active infection with SARS-CoV-2.  Clinical  correlation with patient history and other diagnostic information is  necessary to determine patient infection status.  Positive results do  not rule out bacterial infection or co-infection with other viruses. If result is PRESUMPTIVE POSTIVE SARS-CoV-2 nucleic acids MAY BE PRESENT.   A presumptive positive result was obtained on the submitted specimen  and confirmed on repeat testing.  While 2019 novel coronavirus  (SARS-CoV-2) nucleic acids may be present in the submitted sample  additional confirmatory testing may be necessary for epidemiological  and / or clinical management purposes  to differentiate between  SARS-CoV-2 and other Sarbecovirus currently known to infect humans.  If clinically indicated additional testing with an alternate test  methodology [phone]) is advised. The SARS-CoV-2 RNA is generally  detectable in upper and lower respiratory sp ecimens during the acute  phase of infection. The expected result is Negative. Fact Sheet for Patients:  StrictlyIdeas.no Fact Sheet for Healthcare  Providers: https://pope.com/https://www.fda.gov/media/136313/download This test is not yet approved or cleared by the [place] FDA and has been authorized for detection and/or diagnosis of SARS-CoV-2 by FDA under an Emergency Use Authorization (EUA).  This EUA will remain in effect (meaning this test can be  used) for the duration of the COVID-19 declaration under Section 564(b)(1) of the Act, 21 U.S.C. section 360bbb-3(b)(1), unless the authorization is terminated or revoked sooner. Performed at [facility], [address] [place], [place] [address]    Studies/Results: No results found. Medications: I have reviewed the patient's current medications. Scheduled Meds: . sodium chloride   Intravenous Once  . docusate sodium  100 mg Oral BID  . folic acid  1 mg Oral Daily  . multivitamin with minerals  1 tablet Oral Daily  . Ensure Max Protein  11 oz Oral BID  . sodium chloride flush  10-40 mL Intracatheter Q12H  . sodium chloride flush  3 mL Intravenous Q12H  . thiamine  100 mg Oral Daily   Or  . thiamine  100 mg Intravenous Daily   Continuous Infusions: . sodium chloride Stopped (06/25/19 1439)  . famotidine (PEPCID) IV 20 mg (06/26/19 0849)  . lactated ringers Stopped (06/26/19 0603)  . levofloxacin (LEVAQUIN) IV Stopped (06/25/19 1726)  . metronidazole 500 mg (06/26/19 0603)   PRN Meds:.sodium chloride, diphenhydrAMINE, morphine injection, ondansetron **OR** ondansetron (ZOFRAN) IV, sodium chloride flush, sodium chloride flush   Assessment: Active Problems:   Pancreatitis   Thrombocytopenia (HCC)   Elevated troponin  Bruce Gattis48 y.o.malewith a history of polysubstance, alcohol use. Admitted on 06/22/2019 with alcoholic pancreatitis. Initially on CT scan concern for possible gastric outlet obstruction. Has been taking 3-4 Aleve daily for a month. Urine positive for cocaine. No intrahepatic biliary ductal dilation on CT scan of the abdomen.Abdominal pain has improved   Plan: 1. Stop all alcohol and illegal drug use. 2. soft low fat diet and advance gradually as tyolerated  3.  Stop all NSAID use. There is some concern for gastric outlet obstruction on the CAT scan which may be due to edema from all the pancreatitis. since tolerating orally will need  EGD as an outpatient - he would need to stop using cocaine as contraindicated with anesthesai.  4. PPI 5. H pylori stool antigen  I will sign off.  Please call me if any further GI concerns or questions.  We would like to thank you for the opportunity to participate in the care of [facility].     LOS: 3 days   Wyline MoodKiran Yisel Megill, MD 06/26/2019, 10:43 AM

## 2019-06-27 LAB — CBC
HCT: 31 % — ABNORMAL LOW (ref 39.0–52.0)
Hemoglobin: 10.9 g/dL — ABNORMAL LOW (ref 13.0–17.0)
MCH: 31.1 pg (ref 26.0–34.0)
MCHC: 35.2 g/dL (ref 30.0–36.0)
MCV: 88.3 fL (ref 80.0–100.0)
Platelets: 38 10*3/uL — ABNORMAL LOW (ref 150–400)
RBC: 3.51 MIL/uL — ABNORMAL LOW (ref 4.22–5.81)
RDW: 14.3 % (ref 11.5–15.5)
WBC: 7.8 10*3/uL (ref 4.0–10.5)
nRBC: 1.8 % — ABNORMAL HIGH (ref 0.0–0.2)

## 2019-06-27 LAB — COMPREHENSIVE METABOLIC PANEL
ALT: 29 U/L (ref 0–44)
AST: 72 U/L — ABNORMAL HIGH (ref 15–41)
Albumin: 2.6 g/dL — ABNORMAL LOW (ref 3.5–5.0)
Alkaline Phosphatase: 49 U/L (ref 38–126)
Anion gap: 6 (ref 5–15)
BUN: 12 mg/dL (ref 6–20)
CO2: 27 mmol/L (ref 22–32)
Calcium: 8.6 mg/dL — ABNORMAL LOW (ref 8.9–10.3)
Chloride: 102 mmol/L (ref 98–111)
Creatinine, Ser: 0.81 mg/dL (ref 0.61–1.24)
GFR calc Af Amer: >60 mL/min (ref >60)
GFR calc non Af Amer: >60 mL/min (ref >60)
Glucose, Bld: 101 mg/dL — ABNORMAL HIGH (ref 70–99)
Potassium: 3.1 mmol/L — ABNORMAL LOW (ref 3.5–5.1)
Sodium: 135 mmol/L (ref 135–145)
Total Bilirubin: 2.6 mg/dL — ABNORMAL HIGH (ref 0.3–1.2)
Total Protein: 5.8 g/dL — ABNORMAL LOW (ref 6.5–8.1)

## 2019-06-27 LAB — LIPASE, BLOOD: Lipase: 93 U/L — ABNORMAL HIGH (ref 11–51)

## 2019-06-27 MED ORDER — POTASSIUM CHLORIDE CRYS ER 20 MEQ PO TBCR
40.0000 meq | EXTENDED_RELEASE_TABLET | Freq: Once | ORAL | Status: AC
  Administered 2019-06-27: 10:00:00 40 meq via ORAL
  Filled 2019-06-27: qty 2

## 2019-06-27 NOTE — Progress Notes (Signed)
[name], male   DOB: May 26, 1971, 48 y.o.   MRN: [id number]  [facility] PROGRESS NOTE  Bruce [name] QVZ:[id number] DOB: 1971-03-29 DOA: 06/22/2019 PCP: Patient, No Pcp Per  HPI/Subjective: Patient has some abdominal soreness.  States he is urinating a little bit better.  Tolerating diet.  Platelets starting to come up.  Objective: Vitals:   06/27/19 0341 06/27/19 0727  BP: (!) 139/94 121/90  Pulse: 74 86  Resp: 20 18  Temp: 99.6 F (37.6 C) 99 F (37.2 C)  SpO2: 100% 98%    Filed Weights   06/25/19 0306 06/26/19 0320 06/27/19 0341  Weight: 74.6 kg 74.8 kg 74.1 kg    ROS: Review of Systems  Constitutional: Negative for chills and fever.  Eyes: Negative for blurred vision.  Respiratory: Negative for cough, shortness of breath and wheezing.   Cardiovascular: Negative for chest pain.  Gastrointestinal: Positive for abdominal pain. Negative for constipation, diarrhea, nausea and vomiting.  Genitourinary: Negative for dysuria.  Musculoskeletal: Negative for joint pain.  Neurological: Negative for dizziness and headaches.   Exam: Physical Exam  Constitutional: He is oriented to person, place, and time.  HENT:  Nose: No mucosal edema.  Mouth/Throat: No oropharyngeal exudate or posterior oropharyngeal edema.  Eyes: Pupils are equal, round, and reactive to light. Conjunctivae, EOM and lids are normal.  Neck: No JVD present. Carotid bruit is not present. No edema present. No thyroid mass and no thyromegaly present.  Cardiovascular: S1 normal and S2 normal. Exam reveals no gallop.  No murmur heard. Pulses:      Dorsalis pedis pulses are 2+ on the right side and 2+ on the left side.  Respiratory: No respiratory distress. He has no wheezes. He has no rhonchi. He has no rales.  GI: Soft. Bowel sounds are normal. There is abdominal tenderness in the right upper quadrant, epigastric area and left upper quadrant.  Musculoskeletal:     Right ankle: He exhibits no  swelling.     Left ankle: He exhibits no swelling.  Lymphadenopathy:    He has no cervical adenopathy.  Neurological: He is alert and oriented to person, place, and time. No cranial nerve deficit.  Skin: Skin is warm. No rash noted. Nails show no clubbing.  Psychiatric: He has a normal mood and affect.      Data Reviewed: Basic Metabolic Panel: Recent Labs  Lab 06/22/19 2359 06/23/19 0524 06/25/19 0226 06/26/19 0532 06/27/19 0545  NA 137 139 137 134* 135  K 3.6 3.8 3.4* 3.0* 3.1*  CL 103 105 106 101 102  CO2 23 22 25 25 27   GLUCOSE 92 78 94 109* 101*  BUN 11 9 18 15 12   CREATININE 0.70 0.72 0.88 0.79 0.81  CALCIUM 9.3 8.8* 8.4* 8.5* 8.6*   Liver Function Tests: Recent Labs  Lab 06/22/19 2359 06/25/19 0226 06/27/19 0545  AST 113* 75* 72*  ALT 78* 32 29  ALKPHOS 94 62 49  BILITOT 1.2 3.4* 2.6*  PROT 8.0 6.5 5.8*  ALBUMIN 4.2 3.0* 2.6*   Recent Labs  Lab 06/22/19 2359 06/24/19 0453 06/25/19 0226 06/27/19 0545  LIPASE 378* 1,020* 240* 93*   CBC: Recent Labs  Lab 06/23/19 0524 06/25/19 0226 06/25/19 0747 06/26/19 0532 06/27/19 0545  WBC 5.0 11.8* 12.0* 9.4 7.8  NEUTROABS  --   --  8.0*  --   --   HGB 15.8 13.1 12.2* 11.0* 10.9*  HCT 45.6 37.9* 35.8* 31.2* 31.0*  MCV 89.4 89.4 90.9 87.9 88.3  PLT 147* 19* 18* 23* 38*     Recent Results (from the past 240 hour(s))  SARS Coronavirus 2 [facility] order, Performed in [facility] hospital lab) Nasopharyngeal Nasopharyngeal Swab     Status: None   Collection Time: 06/23/19  3:45 AM   Specimen: Nasopharyngeal Swab  Result Value Ref Range Status   SARS Coronavirus 2 NEGATIVE NEGATIVE Final    Comment: (NOTE) If result is NEGATIVE SARS-CoV-2 target nucleic acids are NOT DETECTED. The SARS-CoV-2 RNA is generally detectable in upper and lower  respiratory specimens during the acute phase of infection. The lowest  concentration of SARS-CoV-2 viral copies this assay can detect is 250  copies / mL. A negative  result does not preclude SARS-CoV-2 infection  and should not be used as the sole basis for treatment or other  patient management decisions.  A negative result may occur with  improper specimen collection / handling, submission of specimen other  than nasopharyngeal swab, presence of viral mutation(s) within the  areas targeted by this assay, and inadequate number of viral copies  (<250 copies / mL). A negative result must be combined with clinical  observations, patient history, and epidemiological information. If result is POSITIVE SARS-CoV-2 target nucleic acids are DETECTED. The SARS-CoV-2 RNA is generally detectable in upper and lower  respiratory specimens dur ing the acute phase of infection.  Positive  results are indicative of active infection with SARS-CoV-2.  Clinical  correlation with patient history and other diagnostic information is  necessary to determine patient infection status.  Positive results do  not rule out bacterial infection or co-infection with other viruses. If result is PRESUMPTIVE POSTIVE SARS-CoV-2 nucleic acids MAY BE PRESENT.   A presumptive positive result was obtained on the submitted specimen  and confirmed on repeat testing.  While 2019 novel coronavirus  (SARS-CoV-2) nucleic acids may be present in the submitted sample  additional confirmatory testing may be necessary for epidemiological  and / or clinical management purposes  to differentiate between  SARS-CoV-2 and other Sarbecovirus currently known to infect humans.  If clinically indicated additional testing with an alternate test  methodology [id number]) is advised. The SARS-CoV-2 RNA is generally  detectable in upper and lower respiratory sp ecimens during the acute  phase of infection. The expected result is Negative. Fact Sheet for Patients:  BoilerBrush.com.cy Fact Sheet for Healthcare Providers: https://pope.com/ This test is not yet  approved or cleared by the [place] FDA and has been authorized for detection and/or diagnosis of SARS-CoV-2 by FDA under an Emergency Use Authorization (EUA).  This EUA will remain in effect (meaning this test can be used) for the duration of the COVID-19 declaration under Section 564(b)(1) of the Act, 21 U.S.C. section 360bbb-3(b)(1), unless the authorization is terminated or revoked sooner. Performed at [facility], [address] [place], [place] [address]       Scheduled Meds: . sodium chloride   Intravenous Once  . docusate sodium  100 mg Oral BID  . folic acid  1 mg Oral Daily  . levofloxacin  500 mg Oral Q24H  . metroNIDAZOLE  500 mg Oral Q8H  . multivitamin with minerals  1 tablet Oral Daily  . Ensure Max Protein  11 oz Oral BID  . sodium chloride flush  10-40 mL Intracatheter Q12H  . sodium chloride flush  3 mL Intravenous Q12H  . tamsulosin  0.4 mg Oral Daily  . thiamine  100 mg Oral Daily   Continuous Infusions: . sodium  chloride Stopped (06/25/19 1439)  . famotidine (PEPCID) IV 20 mg (06/27/19 1043)  . lactated ringers 75 mL/hr at 06/26/19 1120    Assessment/Plan:  1. Acute severe pancreatitis with edema and or necrosis seen on CT scan.  Continue IV fluids.  Continue empiric antibiotic.  PRN pain and nausea medication.  Tolerating advance diet.  Lipase almost in the normal range. 2. Acute thrombocytopenia.  Appreciate hematology consultation.  Holding Lovenox, cefepime and Protonix.  Platelet count slightly coming up today to 38,000. 3. Duodenal inflammation.  On Pepcid.  Appreciate GI follow-up. 4. Alcohol abuse.  Stop alcohol 5. Paroxysmal atrial fibrillation.  Seen by cardiology.  Not a good candidate for anticoagulation or antiarrhythmics.  Medication stopped by cardiology. 6. Impaired fasting glucose.  Patient not a diabetic hemoglobin A1c 5.6 7. Borderline elevated troponin secondary to demand ischemia from acute pancreatitis.  No further  work-up as per cardiology. 8. History of hepatitis C 9. BPH start Flomax  Code Status:     Code Status Orders  (From admission, onward)         Start     Ordered   06/23/19 0331  Full code  Continuous     06/23/19 0331        Code Status History    Date Active Date Inactive Code Status Order ID Comments User Context   09/13/2018 2100 09/14/2018 1851 Full Code [id number]  Auburn Bilberry, MD Inpatient   03/19/2018 2027 03/20/2018 1444 Full Code [id number]  Adrian Saran, MD Inpatient   Advance Care Planning Activity      Disposition Plan: Potentially able to send home tomorrow if platelet count continues to improve.  Consultants:  Cardiology  Gastroenterology  Hematology  Time spent: 28 minutes.    Bruce [name] [employer]

## 2019-06-27 NOTE — Progress Notes (Signed)
Bruce [name]   DOB:05-Oct-1971   HK#:[id number]    Subjective: Patient continues to deny any nausea vomiting.  He is currently on liquid diet.  He is to complain of mild to moderate abdominal discomfort.  Overall improved since admission.  Denies any bleeding.  Objective:  Vitals:   06/27/19 0341 06/27/19 0727  BP: (!) 139/94 121/90  Pulse: 74 86  Resp: 20 18  Temp: 99.6 F (37.6 C) 99 F (37.2 C)  SpO2: 100% 98%     Intake/Output Summary (Last 24 hours) at 06/27/2019 0953 Last data filed at 06/27/2019 0645 Gross per 24 hour  Intake -  Output 750 ml  Net -750 ml    Physical Exam  Constitutional: He is oriented to person, place, and time and well-developed, well-nourished, and in no distress.  HENT:  Head: Normocephalic and atraumatic.  Mouth/Throat: Oropharynx is clear and moist. No oropharyngeal exudate.  Eyes: Pupils are equal, round, and reactive to light.  Neck: Normal range of motion. Neck supple.  Cardiovascular: Normal rate and regular rhythm.  Pulmonary/Chest: Effort normal and breath sounds normal. No respiratory distress. He has no wheezes.  Abdominal: Soft. Bowel sounds are normal. He exhibits no distension and no mass. There is no abdominal tenderness. There is no rebound and no guarding.  Mild tenderness noted in the epigastric region.  Overall improved.  Musculoskeletal: Normal range of motion.        General: No tenderness or edema.  Neurological: He is alert and oriented to person, place, and time.  Skin: Skin is warm.  Psychiatric: Affect normal.       Labs:  Lab Results  Component Value Date   WBC 7.8 06/27/2019   HGB 10.9 (L) 06/27/2019   HCT 31.0 (L) 06/27/2019   MCV 88.3 06/27/2019   PLT 38 (L) 06/27/2019   NEUTROABS 8.0 (H) 06/25/2019    Lab Results  Component Value Date   NA 135 06/27/2019   K 3.1 (L) 06/27/2019   CL 102 06/27/2019   CO2 27 06/27/2019    Studies:  No results found.  Thrombocytopenia [facility]) # 48 year old male patient  history of alcoholism is currently admitted to hospital for acute alcohol induced pancreatitis-noted to have severe thrombocytopenia [Nadir-18]  #Severe thrombocytopenia-etiology unclear-multifactorial-EtOH abuse/pancreatitis.  Less likely/antibiotics.  HIT antibody panel negative.  Patient's platelets slightly improving at 38 from nadir of 18 yesterday.  No evidence of bleeding.  #Acute alcohol induced pancreatitis-clinically improving/lipase improving.  Followed by gastroenterology.  #Alcoholism-discussed/counseled regarding alcohol cessation.     Cammie Sickle, MD 06/27/2019  9:53 AM

## 2019-06-27 NOTE — Progress Notes (Signed)
Since 1700 he had pairs of PVCs ten times.  Prior to this he has had a pair once every 3-4 hours.  Dr. Brett Albino made aware.

## 2019-06-28 ENCOUNTER — Inpatient Hospital Stay: Payer: Self-pay

## 2019-06-28 LAB — CBC
HCT: 30.7 % — ABNORMAL LOW (ref 39.0–52.0)
Hemoglobin: 10.5 g/dL — ABNORMAL LOW (ref 13.0–17.0)
MCH: 30.9 pg (ref 26.0–34.0)
MCHC: 34.2 g/dL (ref 30.0–36.0)
MCV: 90.3 fL (ref 80.0–100.0)
Platelets: 81 10*3/uL — ABNORMAL LOW (ref 150–400)
RBC: 3.4 MIL/uL — ABNORMAL LOW (ref 4.22–5.81)
RDW: 15 % (ref 11.5–15.5)
WBC: 7.2 10*3/uL (ref 4.0–10.5)
nRBC: 1.1 % — ABNORMAL HIGH (ref 0.0–0.2)

## 2019-06-28 LAB — LIPASE, BLOOD: Lipase: 111 U/L — ABNORMAL HIGH (ref 11–51)

## 2019-06-28 MED ORDER — THIAMINE HCL 100 MG PO TABS: 100.0000 mg | ORAL_TABLET | Freq: Every day | ORAL | 0 refills | Status: DC

## 2019-06-28 MED ORDER — FAMOTIDINE 20 MG PO TABS: 20.0000 mg | ORAL_TABLET | Freq: Two times a day (BID) | ORAL | 0 refills | Status: DC

## 2019-06-28 MED ORDER — METHYLPREDNISOLONE SODIUM SUCC 40 MG IJ SOLR
20.0000 mg | Freq: Once | INTRAMUSCULAR | Status: AC
  Administered 2019-06-28: 20 mg via INTRAVENOUS
  Filled 2019-06-28: qty 1

## 2019-06-28 MED ORDER — ENSURE MAX PROTEIN PO LIQD: 11.0000 [oz_av] | Freq: Two times a day (BID) | ORAL | 0 refills | Status: DC

## 2019-06-28 MED ORDER — ONDANSETRON HCL 4 MG PO TABS: 4.0000 mg | ORAL_TABLET | Freq: Four times a day (QID) | ORAL | 0 refills | Status: DC | PRN

## 2019-06-28 MED ORDER — PREDNISONE 5 MG PO TABS: ORAL_TABLET | ORAL | 0 refills | Status: DC

## 2019-06-28 MED ORDER — OXYCODONE HCL 5 MG PO TABS: 5.0000 mg | ORAL_TABLET | Freq: Four times a day (QID) | ORAL | 0 refills | Status: DC | PRN

## 2019-06-28 MED ORDER — TAMSULOSIN HCL 0.4 MG PO CAPS: 0.4000 mg | ORAL_CAPSULE | Freq: Every day | ORAL | 0 refills | Status: DC

## 2019-06-28 NOTE — Progress Notes (Signed)
Patient refused left wrist brace, states that his ride will be here in a few and that he needs to go ahead and leave.

## 2019-06-28 NOTE — Progress Notes (Signed)
Rudolf Blizard   DOB:[birth year]   FY#:[id number]    Subjective: Patient able to tolerate soft diet well.  No significant nausea vomiting.  Wants to go home.\If possible.  No bleeding.  Objective:  Vitals:   06/28/19 0427 06/28/19 0717  BP: 127/85 (!) 150/97  Pulse: 68 66  Resp: 18 19  Temp: 99.1 F (37.3 C) 98.6 F (37 C)  SpO2: 100% 99%     Intake/Output Summary (Last 24 hours) at 06/28/2019 0957 Last data filed at 06/28/2019 8527 Gross per 24 hour  Intake 480 ml  Output 400 ml  Net 80 ml    Physical Exam  Constitutional: He is oriented to person, place, and time and well-developed, well-nourished, and in no distress.  HENT:  Head: Normocephalic and atraumatic.  Mouth/Throat: Oropharynx is clear and moist. No oropharyngeal exudate.  Eyes: Pupils are equal, round, and reactive to light.  Neck: Normal range of motion. Neck supple.  Cardiovascular: Normal rate and regular rhythm.  Pulmonary/Chest: Effort normal and breath sounds normal. No respiratory distress. He has no wheezes.  Abdominal: Soft. Bowel sounds are normal. He exhibits no distension and no mass. There is no abdominal tenderness. There is no rebound and no guarding.  Mild tenderness noted in the epigastric region.  Overall improved.  Musculoskeletal: Normal range of motion.        General: No tenderness or edema.     Comments: Mild swelling of the left wrist.  Neurological: He is alert and oriented to person, place, and time.  Skin: Skin is warm.  Psychiatric: Affect normal.       Labs:  Lab Results  Component Value Date   WBC 7.2 06/28/2019   HGB 10.5 (L) 06/28/2019   HCT 30.7 (L) 06/28/2019   MCV 90.3 06/28/2019   PLT 81 (L) 06/28/2019   NEUTROABS 8.0 (H) 06/25/2019    Lab Results  Component Value Date   NA 135 06/27/2019   K 3.1 (L) 06/27/2019   CL 102 06/27/2019   CO2 27 06/27/2019    Studies:  Dg Wrist 2 Views Left  Result Date: 06/28/2019 CLINICAL DATA:  Acute left wrist pain without  known injury. EXAM: LEFT WRIST - 2 VIEW COMPARISON:  Radiographs of December 22, 2012. FINDINGS: There is no evidence of fracture or dislocation. There is no evidence of arthropathy or other focal bone abnormality. Soft tissues are unremarkable. IMPRESSION: Negative. Electronically Signed   By: Marijo Conception M.D.   On: 06/28/2019 09:12    Thrombocytopenia [facility]) # 48 year old male patient history of alcoholism is currently admitted to hospital for acute alcohol induced pancreatitis-noted to have severe thrombocytopenia [Nadir-18]  #Severe thrombocytopenia-etiology unclear-multifactorial-EtOH abuse/pancreatitis.  Patient's platelets significantly improving 83 from nadir of 18 2 days ago.  No evidence of bleeding.  #Acute alcohol induced pancreatitis-clinically improving/lipase improving.  Followed by gastroenterology.  #Left wrist pain-question etiology recommend ice packs.  #Alcoholism-discussed/counseled regarding alcohol cessation.    #Patient can be discharged from hematology standpoint; recommend CBC in approximately 2 weeks with PCP.  Follow-up with hematology as needed.   Cammie Sickle, MD 06/28/2019  9:57 AM

## 2019-06-28 NOTE — Discharge Summary (Signed)
[employer] - [place] at [facility]   PATIENT NAME: Bruce [name]    MR#:  [id number]  DATE OF BIRTH:  [birth year]  DATE OF ADMISSION:  06/22/2019 ADMITTING PHYSICIAN: Hannah Beat, MD  DATE OF DISCHARGE: 06/28/2019 11:20 AM  PRIMARY CARE PHYSICIAN: Patient, No Pcp Per    ADMISSION DIAGNOSIS:  Elevated troponin [R79.89] Acute pancreatitis, unspecified complication status, unspecified pancreatitis type [K85.90]  DISCHARGE DIAGNOSIS:  Active Problems:   Pancreatitis   Thrombocytopenia (HCC)   Elevated troponin   SECONDARY DIAGNOSIS:   Past Medical History:  Diagnosis Date  . A-fib (HCC)   . Dental caries   . Hepatitis C infection   . Iritis   . PAF (paroxysmal atrial fibrillation) (HCC)    a. diagnosed 03/19/18; b. CHADS2VASc 0  . Polysubstance abuse (HCC)    a. cocaine, tobacco, etoh  . Scrotal mass   . Tick borne fever   . Uveitis     HOSPITAL COURSE:   1.  Acute severe pancreatitis with edema and or necrosis seen on CT scan.  The patient was given vigorous IV fluids during the hospital course.  The patient's pain had improved.  Lipase has come down.  Patient was tolerating solid food.  Follow-up with Dr. Tobi Bastos gastroenterology as outpatient.  Patient was given empiric antibiotics during the hospital course but discontinued upon discharge. 2.  Acute thrombocytopenia.  The patient's platelet count was in the low normal range and then dropped down to 18,000.  I stopped Lovenox, Protonix and Maxipime.  Platelet count did rebound over the next few days and came up to 81,000. 3.  Duodenal inflammation.  Initially the patient was placed on Protonix and then switched over to Pepcid.  Patient tolerating diet upon discharge position 4.  Alcohol abuse.  Patient must stop alcohol especially with pancreatitis.  No signs of withdrawal during the hospital course. 5.  Paroxysmal atrial fibrillation.  Seen by cardiology.  They stated that he was not a good candidate for  anticoagulation or antiarrhythmics.  All medication stopped by cardiology. 6.  Impaired fasting glucose.  Patient is not a diabetic. 7.  Borderline elevated troponin.  This is demand ischemia from acute pancreatitis.  No further work-up as per cardiology. 8.  History of hepatitis C 9.  BPH start Flomax.  Patient started urinating better after Flomax was started. 10.  Left wrist pain.  Seems to me like some tendon inflammation.  No signs of infection seen on the wrist.  X-ray was negative for fracture.  I did give a dose of IV Solu-Medrol and a few days of prednisone to go home with. 11.  Advised not to use illicit substances  DISCHARGE CONDITIONS:   Satisfactory  CONSULTS OBTAINED:  Cardiology Gastroenterology Oncology  DRUG ALLERGIES:   Allergies  Allergen Reactions  . Penicillins Other (See Comments)    Childhood reaction    DISCHARGE MEDICATIONS:   Allergies as of 06/28/2019      Reactions   Penicillins Other (See Comments)   Childhood reaction      Medication List    STOP taking these medications   aluminum-magnesium hydroxide-simethicone 200-200-20 MG/5ML Susp Commonly known as: MAALOX   clotrimazole 10 MG troche Commonly known as: MYCELEX   diltiazem 180 MG 24 hr capsule Commonly known as: CARDIZEM CD   flecainide 50 MG tablet Commonly known as: TAMBOCOR     TAKE these medications   Ensure Max Protein Liqd Take 330 mLs (11 oz total) by mouth 2 (  two) times daily.   famotidine 20 MG tablet Commonly known as: PEPCID Take 1 tablet (20 mg total) by mouth 2 (two) times daily.   ondansetron 4 MG tablet Commonly known as: ZOFRAN Take 1 tablet (4 mg total) by mouth every 6 (six) hours as needed for nausea.   oxyCODONE 5 MG immediate release tablet Commonly known as: Oxy IR/ROXICODONE Take 1 tablet (5 mg total) by mouth every 6 (six) hours as needed for moderate pain or severe pain.   predniSONE 5 MG tablet Commonly known as: DELTASONE 3 tabs po day1; 2  tabs po day 2; 1 tab po day 3; 1/2 tab po day4,5 Start taking on: June 29, 2019   tamsulosin 0.4 MG Caps capsule Commonly known as: FLOMAX Take 1 capsule (0.4 mg total) by mouth daily.   thiamine 100 MG tablet Take 1 tablet (100 mg total) by mouth daily.        DISCHARGE INSTRUCTIONS:   Follow-up Dr. on his Medicaid card Follow-up Dr. Tobi Bastos in a few weeks  If you experience worsening of your admission symptoms, develop shortness of breath, life threatening emergency, suicidal or homicidal thoughts you must seek medical attention immediately by calling 911 or calling your MD immediately  if symptoms less severe.  You Must read complete instructions/literature along with all the possible adverse reactions/side effects for all the Medicines you take and that have been prescribed to you. Take any new Medicines after you have completely understood and accept all the possible adverse reactions/side effects.   Please note  You were cared for by a hospitalist during your hospital stay. If you have any questions about your discharge medications or the care you received while you were in the hospital after you are discharged, you can call the unit and asked to speak with the hospitalist on call if the hospitalist that took care of you is not available. Once you are discharged, your primary care physician will handle any further medical issues. Please note that NO REFILLS for any discharge medications will be authorized once you are discharged, as it is imperative that you return to your primary care physician (or establish a relationship with a primary care physician if you do not have one) for your aftercare needs so that they can reassess your need for medications and monitor your lab values.    Today   CHIEF COMPLAINT:   Chief Complaint  Patient presents with  . Abdominal Pain    HISTORY OF PRESENT ILLNESS:  Bruce [name]  is a 48 y.o. male with abdominal pain   VITAL SIGNS:   Blood pressure (!) 150/97, pulse 66, temperature 98.6 F (37 C), temperature source Oral, resp. rate 19, height 5\' 7"  (1.702 m), weight 74.1 kg, SpO2 99 %.    PHYSICAL EXAMINATION:  GENERAL:  48 y.o.-year-old patient lying in the bed with no acute distress.  EYES: Pupils equal, round, reactive to light and accommodation. No scleral icterus. Extraocular muscles intact.  HEENT: Head atraumatic, normocephalic. Oropharynx and nasopharynx clear.  NECK:  Supple, no jugular venous distention. No thyroid enlargement, no tenderness.  LUNGS: Normal breath sounds bilaterally, no wheezing, rales,rhonchi or crepitation. No use of accessory muscles of respiration.  CARDIOVASCULAR: S1, S2 normal. No murmurs, rubs, or gallops.  ABDOMEN: Soft, non-tender, non-distended. Bowel sounds present. No organomegaly or mass.  EXTREMITIES: No pedal edema, cyanosis, or clubbing.  NEUROLOGIC: Cranial nerves II through XII are intact. Muscle strength 5/5 in all extremities. Sensation intact. Gait not checked.  PSYCHIATRIC: The patient is alert and oriented x 3.  SKIN: No obvious rash, lesion, or ulcer.   DATA REVIEW:   CBC Recent Labs  Lab 06/28/19 0652  WBC 7.2  HGB 10.5*  HCT 30.7*  PLT 81*    Chemistries  Recent Labs  Lab 06/27/19 0545  NA 135  K 3.1*  CL 102  CO2 27  GLUCOSE 101*  BUN 12  CREATININE 0.81  CALCIUM 8.6*  AST 72*  ALT 29  ALKPHOS 49  BILITOT 2.6*    Microbiology Results  Results for orders placed or performed during the hospital encounter of 06/22/19  SARS Coronavirus 2 [facility] order, Performed in [facility] hospital lab) Nasopharyngeal Nasopharyngeal Swab     Status: None   Collection Time: 06/23/19  3:45 AM   Specimen: Nasopharyngeal Swab  Result Value Ref Range Status   SARS Coronavirus 2 NEGATIVE NEGATIVE Final    Comment: (NOTE) If result is NEGATIVE SARS-CoV-2 target nucleic acids are NOT DETECTED. The SARS-CoV-2 RNA is generally detectable in upper and lower   respiratory specimens during the acute phase of infection. The lowest  concentration of SARS-CoV-2 viral copies this assay can detect is 250  copies / mL. A negative result does not preclude SARS-CoV-2 infection  and should not be used as the sole basis for treatment or other  patient management decisions.  A negative result may occur with  improper specimen collection / handling, submission of specimen other  than nasopharyngeal swab, presence of viral mutation(s) within the  areas targeted by this assay, and inadequate number of viral copies  (<250 copies / mL). A negative result must be combined with clinical  observations, patient history, and epidemiological information. If result is POSITIVE SARS-CoV-2 target nucleic acids are DETECTED. The SARS-CoV-2 RNA is generally detectable in upper and lower  respiratory specimens dur ing the acute phase of infection.  Positive  results are indicative of active infection with SARS-CoV-2.  Clinical  correlation with patient history and other diagnostic information is  necessary to determine patient infection status.  Positive results do  not rule out bacterial infection or co-infection with other viruses. If result is PRESUMPTIVE POSTIVE SARS-CoV-2 nucleic acids MAY BE PRESENT.   A presumptive positive result was obtained on the submitted specimen  and confirmed on repeat testing.  While 2019 novel coronavirus  (SARS-CoV-2) nucleic acids may be present in the submitted sample  additional confirmatory testing may be necessary for epidemiological  and / or clinical management purposes  to differentiate between  SARS-CoV-2 and other Sarbecovirus currently known to infect humans.  If clinically indicated additional testing with an alternate test  methodology [phone]) is advised. The SARS-CoV-2 RNA is generally  detectable in upper and lower respiratory sp ecimens during the acute  phase of infection. The expected result is Negative. Fact  Sheet for Patients:  BoilerBrush.com.cy Fact Sheet for Healthcare Providers: https://pope.com/ This test is not yet approved or cleared by the [place] FDA and has been authorized for detection and/or diagnosis of SARS-CoV-2 by FDA under an Emergency Use Authorization (EUA).  This EUA will remain in effect (meaning this test can be used) for the duration of the COVID-19 declaration under Section 564(b)(1) of the Act, 21 U.S.C. section 360bbb-3(b)(1), unless the authorization is terminated or revoked sooner. Performed at [facility], [address] [place], [place] [address]     RADIOLOGY:  Dg Wrist 2 Views Left  Result Date: 06/28/2019 CLINICAL DATA:  Acute left wrist  pain without known injury. EXAM: LEFT WRIST - 2 VIEW COMPARISON:  Radiographs of December 22, 2012. FINDINGS: There is no evidence of fracture or dislocation. There is no evidence of arthropathy or other focal bone abnormality. Soft tissues are unremarkable. IMPRESSION: Negative. Electronically Signed   By: Lupita Raider M.D.   On: 06/28/2019 09:12      Management plans discussed with the patient, and family and they are in agreement.  CODE STATUS:  Code Status History    Date Active Date Inactive Code Status Order ID Comments User Context   06/23/2019 0331 06/28/2019 1429 Full Code [id number]  Pearletha Alfred, NP ED   09/13/2018 2100 09/14/2018 1851 Full Code [id number]  Auburn Bilberry, MD Inpatient   03/19/2018 2027 03/20/2018 1444 Full Code [id number]  Adrian Saran, MD Inpatient   Advance Care Planning Activity      TOTAL TIME TAKING CARE OF THIS PATIENT: 35 minutes.    Alford Highland M.D on 06/28/2019 at 5:29 PM  Between 7am to 6pm - Pager - [phone]  After 6pm go to [email or web address] - password [email or web address]  [employer] Office  [phone]  CC: Primary care physician; Patient, No Pcp Per

## 2020-01-12 ENCOUNTER — Emergency Department: Payer: Self-pay

## 2020-01-12 ENCOUNTER — Inpatient Hospital Stay
Admission: EM | Admit: 2020-01-12 | Discharge: 2020-01-14 | DRG: 440 | Disposition: A | Discharge status: Home / Self Care | Payer: Self-pay | Attending: Internal Medicine | Admitting: Internal Medicine

## 2020-01-12 ENCOUNTER — Inpatient Hospital Stay: Payer: Self-pay

## 2020-01-12 ENCOUNTER — Other Ambulatory Visit: Payer: Self-pay

## 2020-01-12 ENCOUNTER — Encounter: Payer: Self-pay | Admitting: Emergency Medicine

## 2020-01-12 DIAGNOSIS — K859 Acute pancreatitis without necrosis or infection, unspecified: Secondary | ICD-10-CM | POA: Diagnosis present

## 2020-01-12 DIAGNOSIS — Z7952 Long term (current) use of systemic steroids: Secondary | ICD-10-CM

## 2020-01-12 DIAGNOSIS — Z79899 Other long term (current) drug therapy: Secondary | ICD-10-CM

## 2020-01-12 DIAGNOSIS — Z813 Family history of other psychoactive substance abuse and dependence: Secondary | ICD-10-CM

## 2020-01-12 DIAGNOSIS — B1920 Unspecified viral hepatitis C without hepatic coma: Secondary | ICD-10-CM | POA: Diagnosis present

## 2020-01-12 DIAGNOSIS — Z880 Allergy status to penicillin: Secondary | ICD-10-CM

## 2020-01-12 DIAGNOSIS — F17210 Nicotine dependence, cigarettes, uncomplicated: Secondary | ICD-10-CM | POA: Diagnosis present

## 2020-01-12 DIAGNOSIS — Z20822 Contact with and (suspected) exposure to covid-19: Secondary | ICD-10-CM | POA: Diagnosis present

## 2020-01-12 DIAGNOSIS — Z79891 Long term (current) use of opiate analgesic: Secondary | ICD-10-CM

## 2020-01-12 DIAGNOSIS — E876 Hypokalemia: Secondary | ICD-10-CM

## 2020-01-12 DIAGNOSIS — B182 Chronic viral hepatitis C: Secondary | ICD-10-CM

## 2020-01-12 DIAGNOSIS — I480 Paroxysmal atrial fibrillation: Secondary | ICD-10-CM

## 2020-01-12 DIAGNOSIS — R1011 Right upper quadrant pain: Secondary | ICD-10-CM

## 2020-01-12 DIAGNOSIS — Z8619 Personal history of other infectious and parasitic diseases: Secondary | ICD-10-CM | POA: Diagnosis present

## 2020-01-12 DIAGNOSIS — K852 Alcohol induced acute pancreatitis without necrosis or infection: Principal | ICD-10-CM | POA: Diagnosis present

## 2020-01-12 DIAGNOSIS — F1020 Alcohol dependence, uncomplicated: Secondary | ICD-10-CM | POA: Diagnosis present

## 2020-01-12 DIAGNOSIS — R0789 Other chest pain: Secondary | ICD-10-CM | POA: Diagnosis present

## 2020-01-12 DIAGNOSIS — F1010 Alcohol abuse, uncomplicated: Secondary | ICD-10-CM

## 2020-01-12 DIAGNOSIS — Y903 Blood alcohol level of 60-79 mg/100 ml: Secondary | ICD-10-CM | POA: Diagnosis present

## 2020-01-12 DIAGNOSIS — D696 Thrombocytopenia, unspecified: Secondary | ICD-10-CM | POA: Diagnosis present

## 2020-01-12 LAB — PHOSPHORUS: Phosphorus: 3.4 mg/dL (ref 2.5–4.6)

## 2020-01-12 LAB — CBC WITH DIFFERENTIAL/PLATELET
Abs Immature Granulocytes: 0.01 10*3/uL (ref 0.00–0.07)
Basophils Absolute: 0 10*3/uL (ref 0.0–0.1)
Basophils Relative: 0 %
Eosinophils Absolute: 0.1 10*3/uL (ref 0.0–0.5)
Eosinophils Relative: 2 %
HCT: 42.3 % (ref 39.0–52.0)
Hemoglobin: 15.2 g/dL (ref 13.0–17.0)
Immature Granulocytes: 0 %
Lymphocytes Relative: 17 %
Lymphs Abs: 1 10*3/uL (ref 0.7–4.0)
MCH: 32.3 pg (ref 26.0–34.0)
MCHC: 35.9 g/dL (ref 30.0–36.0)
MCV: 89.8 fL (ref 80.0–100.0)
Monocytes Absolute: 0.4 10*3/uL (ref 0.1–1.0)
Monocytes Relative: 8 %
Neutro Abs: 4.2 10*3/uL (ref 1.7–7.7)
Neutrophils Relative %: 73 %
Platelets: 182 10*3/uL (ref 150–400)
RBC: 4.71 MIL/uL (ref 4.22–5.81)
RDW: 12.1 % (ref 11.5–15.5)
WBC: 5.8 10*3/uL (ref 4.0–10.5)
nRBC: 0 % (ref 0.0–0.2)

## 2020-01-12 LAB — URINALYSIS, ROUTINE W REFLEX MICROSCOPIC
Bacteria, UA: NONE SEEN
Bilirubin Urine: NEGATIVE
Glucose, UA: 50 mg/dL — AB
Ketones, ur: 20 mg/dL — AB
Leukocytes,Ua: NEGATIVE
Nitrite: NEGATIVE
Protein, ur: 100 mg/dL — AB
Specific Gravity, Urine: 1.017 (ref 1.005–1.030)
pH: 5 (ref 5.0–8.0)

## 2020-01-12 LAB — COMPREHENSIVE METABOLIC PANEL
ALT: 50 U/L — ABNORMAL HIGH (ref 0–44)
AST: 75 U/L — ABNORMAL HIGH (ref 15–41)
Albumin: 4 g/dL (ref 3.5–5.0)
Alkaline Phosphatase: 78 U/L (ref 38–126)
Anion gap: 18 — ABNORMAL HIGH (ref 5–15)
BUN: 9 mg/dL (ref 6–20)
CO2: 22 mmol/L (ref 22–32)
Calcium: 8.8 mg/dL — ABNORMAL LOW (ref 8.9–10.3)
Chloride: 100 mmol/L (ref 98–111)
Creatinine, Ser: 0.73 mg/dL (ref 0.61–1.24)
GFR calc Af Amer: >60 mL/min (ref >60)
GFR calc non Af Amer: >60 mL/min (ref >60)
Glucose, Bld: 88 mg/dL (ref 70–99)
Potassium: 3.8 mmol/L (ref 3.5–5.1)
Sodium: 140 mmol/L (ref 135–145)
Total Bilirubin: 1 mg/dL (ref 0.3–1.2)
Total Protein: 8 g/dL (ref 6.5–8.1)

## 2020-01-12 LAB — TROPONIN I (HIGH SENSITIVITY)
Troponin I (High Sensitivity): 17 ng/L (ref <18)
Troponin I (High Sensitivity): 18 ng/L — ABNORMAL HIGH (ref <18)
Troponin I (High Sensitivity): 19 ng/L — ABNORMAL HIGH (ref <18)
Troponin I (High Sensitivity): 21 ng/L — ABNORMAL HIGH (ref <18)

## 2020-01-12 LAB — URINE DRUG SCREEN, QUALITATIVE (ARMC ONLY)
Amphetamines, Ur Screen: NOT DETECTED
Barbiturates, Ur Screen: NOT DETECTED
Benzodiazepine, Ur Scrn: NOT DETECTED
Cannabinoid 50 Ng, Ur ~~LOC~~: NOT DETECTED
Cocaine Metabolite,Ur ~~LOC~~: NOT DETECTED
MDMA (Ecstasy)Ur Screen: NOT DETECTED
Methadone Scn, Ur: NOT DETECTED
Opiate, Ur Screen: POSITIVE — AB
Phencyclidine (PCP) Ur S: NOT DETECTED
Tricyclic, Ur Screen: NOT DETECTED

## 2020-01-12 LAB — RESPIRATORY PANEL BY RT PCR (FLU A&B, COVID)
Influenza A by PCR: NEGATIVE
Influenza B by PCR: NEGATIVE
SARS Coronavirus 2 by RT PCR: NEGATIVE

## 2020-01-12 LAB — ETHANOL: Alcohol, Ethyl (B): 75 mg/dL — ABNORMAL HIGH (ref <10)

## 2020-01-12 LAB — GLUCOSE, CAPILLARY: Glucose-Capillary: 82 mg/dL (ref 70–99)

## 2020-01-12 LAB — LIPASE, BLOOD: Lipase: 359 U/L — ABNORMAL HIGH (ref 11–51)

## 2020-01-12 LAB — MAGNESIUM: Magnesium: 1.7 mg/dL (ref 1.7–2.4)

## 2020-01-12 MED ORDER — DIPHENHYDRAMINE HCL 50 MG/ML IJ SOLN
12.5000 mg | Freq: Three times a day (TID) | INTRAMUSCULAR | Status: DC | PRN
  Administered 2020-01-12 – 2020-01-14 (×4): 12.5 mg via INTRAVENOUS
  Filled 2020-01-12 (×4): qty 1

## 2020-01-12 MED ORDER — ONDANSETRON HCL 4 MG/2ML IJ SOLN
4.0000 mg | Freq: Once | INTRAMUSCULAR | Status: AC
  Administered 2020-01-12: 4 mg via INTRAVENOUS
  Filled 2020-01-12: qty 2

## 2020-01-12 MED ORDER — HYDRALAZINE HCL 20 MG/ML IJ SOLN: 10.0000 mg | Freq: Four times a day (QID) | INTRAMUSCULAR | Status: DC | PRN

## 2020-01-12 MED ORDER — ONDANSETRON HCL 4 MG PO TABS: 4.0000 mg | ORAL_TABLET | Freq: Four times a day (QID) | ORAL | Status: DC | PRN

## 2020-01-12 MED ORDER — MORPHINE SULFATE (PF) 2 MG/ML IV SOLN
2.0000 mg | INTRAVENOUS | Status: DC | PRN
  Administered 2020-01-12 – 2020-01-13 (×4): 2 mg via INTRAVENOUS
  Filled 2020-01-12 (×4): qty 1

## 2020-01-12 MED ORDER — DIPHENHYDRAMINE HCL 50 MG/ML IJ SOLN
12.5000 mg | Freq: Once | INTRAMUSCULAR | Status: AC
  Administered 2020-01-12: 12.5 mg via INTRAVENOUS
  Filled 2020-01-12: qty 1

## 2020-01-12 MED ORDER — HYDROMORPHONE HCL 1 MG/ML IJ SOLN
1.0000 mg | INTRAMUSCULAR | Status: DC | PRN
  Administered 2020-01-12 (×2): 1 mg via INTRAVENOUS
  Filled 2020-01-12 (×2): qty 1

## 2020-01-12 MED ORDER — LORAZEPAM 2 MG/ML IJ SOLN
0.0000 mg | Freq: Four times a day (QID) | INTRAMUSCULAR | Status: DC
  Administered 2020-01-12 – 2020-01-13 (×2): 2 mg via INTRAVENOUS
  Administered 2020-01-13: 1 mg via INTRAVENOUS
  Filled 2020-01-12 (×4): qty 1

## 2020-01-12 MED ORDER — IOHEXOL 300 MG/ML  SOLN
100.0000 mL | Freq: Once | INTRAMUSCULAR | Status: AC | PRN
  Administered 2020-01-12: 100 mL via INTRAVENOUS

## 2020-01-12 MED ORDER — LORAZEPAM 1 MG PO TABS: 1.0000 mg | ORAL_TABLET | ORAL | Status: DC | PRN

## 2020-01-12 MED ORDER — LORAZEPAM 2 MG/ML IJ SOLN
1.0000 mg | INTRAMUSCULAR | Status: DC | PRN
  Administered 2020-01-12: 2 mg via INTRAVENOUS
  Administered 2020-01-12: 1 mg via INTRAVENOUS
  Filled 2020-01-12: qty 1

## 2020-01-12 MED ORDER — LABETALOL HCL 5 MG/ML IV SOLN: 10.0000 mg | Freq: Four times a day (QID) | INTRAVENOUS | Status: DC | PRN

## 2020-01-12 MED ORDER — THIAMINE HCL 100 MG/ML IJ SOLN: 100.0000 mg | Freq: Every day | INTRAMUSCULAR | Status: DC

## 2020-01-12 MED ORDER — THIAMINE HCL 100 MG/ML IJ SOLN
Freq: Once | INTRAVENOUS | Status: AC
  Filled 2020-01-12: qty 1000

## 2020-01-12 MED ORDER — LORAZEPAM 2 MG/ML IJ SOLN: 0.0000 mg | Freq: Two times a day (BID) | INTRAMUSCULAR | Status: DC

## 2020-01-12 MED ORDER — PANTOPRAZOLE SODIUM 40 MG IV SOLR
40.0000 mg | Freq: Every day | INTRAVENOUS | Status: DC
  Administered 2020-01-13: 40 mg via INTRAVENOUS
  Filled 2020-01-12: qty 40

## 2020-01-12 MED ORDER — SODIUM CHLORIDE 0.9 % IV BOLUS
1000.0000 mL | Freq: Once | INTRAVENOUS | Status: AC
  Administered 2020-01-12: 1000 mL via INTRAVENOUS

## 2020-01-12 MED ORDER — FOLIC ACID 1 MG PO TABS: 1.0000 mg | ORAL_TABLET | Freq: Every day | ORAL | Status: DC

## 2020-01-12 MED ORDER — ONDANSETRON HCL 4 MG/2ML IJ SOLN: 4.0000 mg | Freq: Four times a day (QID) | INTRAMUSCULAR | Status: DC | PRN

## 2020-01-12 MED ORDER — THIAMINE HCL 100 MG PO TABS: 100.0000 mg | ORAL_TABLET | Freq: Every day | ORAL | Status: DC

## 2020-01-12 MED ORDER — ENOXAPARIN SODIUM 40 MG/0.4ML ~~LOC~~ SOLN: 40.0000 mg | SUBCUTANEOUS | Status: DC

## 2020-01-12 MED ORDER — TAMSULOSIN HCL 0.4 MG PO CAPS: 0.4000 mg | ORAL_CAPSULE | Freq: Every day | ORAL | Status: DC

## 2020-01-12 MED ORDER — ADULT MULTIVITAMIN W/MINERALS CH: 1.0000 | ORAL_TABLET | Freq: Every day | ORAL | Status: DC

## 2020-01-12 NOTE — Plan of Care (Signed)
Continuing with plan of care. 

## 2020-01-12 NOTE — ED Triage Notes (Addendum)
Pt presents to ED via POV with c/o LUQ abdominal and L sided chest pain, pt states hx of pancreatitis at this time. Pt appears uncomfortable, is shaking in triage and repeatedly crying out.   Pt also noted to be forcefully wretching while in triage at this time.

## 2020-01-12 NOTE — Social Work (Signed)
TOC CM/SW consult received.   In progress  Larwance Rote, MSW, LCSW  [phone] 8am-6pm (weekends)

## 2020-01-12 NOTE — ED Notes (Signed)
Pt reports itching following dilaudid, presents without redness to IV site, angioedema, or SOB. MD notified

## 2020-01-12 NOTE — ED Provider Notes (Signed)
[facility] Emergency Department Provider Note  ____________________________________________   First MD Initiated Contact with Patient 01/12/20 1034     (approximate)  I have reviewed the triage vital signs and the nursing notes.   HISTORY  Chief Complaint Abdominal Pain and Chest Pain    HPI Bruce [name] is a 49 y.o. male with paroxysmal A. fib, polysubstance abuse, history of pancreatic necrosis who comes in with severe abdominal pain.  Reviewed patient's prior records and patient was admitted back in August 2020.  During this admission patient was found to have severe pancreatitis with concern for necrosis on CT scan.  It was thought to be secondary to his alcohol use.  Patient was encouraged to stop drinking.  Patient states that he is cut down his drinking.  States he only drink half a beer yesterday.  States that he has severe upper abdominal pain that is constant, nothing makes better, nothing makes it worse.  Denies any chest pain or shortness of breath.  Denies any fevers.  Has had some nausea and vomiting associated with it.          Past Medical History:  Diagnosis Date  . A-fib ([place])   . Dental caries   . Hepatitis C infection   . Iritis   . PAF (paroxysmal atrial fibrillation) ([place])    a. diagnosed 03/19/18; b. CHADS2VASc 0  . Polysubstance abuse ([place])    a. cocaine, tobacco, etoh  . Scrotal mass   . Tick borne fever   . Uveitis     Patient Active Problem List   Diagnosis Date Noted  . Thrombocytopenia ([place]) 06/25/2019  . Elevated troponin   . Pancreatitis 06/23/2019  . A-fib ([place]) 09/13/2018  . Atrial fibrillation ([place]) 03/19/2018  . Abnormality of gait 02/12/2013  . Mandibular fracture ([place]) 12/25/2012  . Flu vaccine need 12/25/2012  . Scrotal mass 05/22/2012  . Dental caries 05/22/2012  . Hepatitis C infection 05/16/2012  . Myalgia 05/06/2012  . Arthralgia 05/06/2012  . Tick borne fever, likely 05/05/2012  . Tinea  cruris, likely 05/05/2012  . Spermatocele of epididymis 05/01/2012  . Bilateral varicoceles 05/01/2012  . Bilateral hydrocele 05/01/2012    Past Surgical History:  Procedure Laterality Date  . FINGER SURGERY     infection, middle left  . fractured jaw    . SHOULDER SURGERY     right    Prior to Admission medications   Medication Sig Start Date End Date Taking? Authorizing Provider  Ensure Max Protein (ENSURE MAX PROTEIN) LIQD Take 330 mLs (11 oz total) by mouth 2 (two) times daily. 06/28/19   Loletha Grayer, MD  famotidine (PEPCID) 20 MG tablet Take 1 tablet (20 mg total) by mouth 2 (two) times daily. 06/28/19 09/26/19  Loletha Grayer, MD  ondansetron (ZOFRAN) 4 MG tablet Take 1 tablet (4 mg total) by mouth every 6 (six) hours as needed for nausea. 06/28/19   Loletha Grayer, MD  oxyCODONE (OXY IR/ROXICODONE) 5 MG immediate release tablet Take 1 tablet (5 mg total) by mouth every 6 (six) hours as needed for moderate pain or severe pain. 06/28/19   Loletha Grayer, MD  predniSONE (DELTASONE) 5 MG tablet 3 tabs po day1; 2 tabs po day 2; 1 tab po day 3; 1/2 tab po day4,5 06/29/19   Loletha Grayer, MD  tamsulosin (FLOMAX) 0.4 MG CAPS capsule Take 1 capsule (0.4 mg total) by mouth daily. 06/28/19   Loletha Grayer, MD  thiamine 100 MG tablet Take 1  tablet (100 mg total) by mouth daily. 06/28/19   Alford Highland, MD    Allergies Penicillins  Family History  Problem Relation Age of Onset  . Drug abuse Father     Social History Social History   Tobacco Use  . Smoking status: Current Some Day Smoker    Packs/day: 0.25    Types: Cigarettes  . Smokeless tobacco: Never Used  . Tobacco comment: Per pt  " I only smoke cigarettes when I drink beer"  Substance Use Topics  . Alcohol use: Yes    Comment: 6 pack per week  . Drug use: Yes    Types: IV, Cocaine    Comment: reports last time he used cocaine was thursday       Review of Systems Constitutional: No  fever/chills Eyes: No visual changes. ENT: No sore throat. Cardiovascular: Denies chest pain. Respiratory: Denies shortness of breath. Gastrointestinal: Positive abdominal pain, nausea, vomiting Genitourinary: Negative for dysuria. Musculoskeletal: Negative for back pain. Skin: Negative for rash. Neurological: Negative for headaches, focal weakness or numbness. All other ROS negative ____________________________________________   PHYSICAL EXAM:  VITAL SIGNS: ED Triage Vitals  Enc Vitals Group     BP 01/12/20 1014 (!) 145/93     Pulse Rate 01/12/20 1014 (!) 101     Resp 01/12/20 1014 18     Temp 01/12/20 1014 98.2 F (36.8 C)     Temp Source 01/12/20 1014 Oral     SpO2 01/12/20 1014 100 %     Weight 01/12/20 1025 165 lb (74.8 kg)     Height 01/12/20 1025 5\' 7"  (1.702 m)     Head Circumference --      Peak Flow --      Pain Score 01/12/20 1024 10     Pain Loc --      Pain Edu? --      Excl. in GC? --     Constitutional: Alert and oriented. Well appearing and in no acute distress. Eyes: Conjunctivae are normal. EOMI. Head: Atraumatic. Nose: No congestion/rhinnorhea. Mouth/Throat: Mucous membranes are moist.   Neck: No stridor. Trachea Midline. FROM Cardiovascular: Tachycardic, regular rhythm. Grossly normal heart sounds.  Good peripheral circulation. Respiratory: Normal respiratory effort.  No retractions. Lungs CTAB. Gastrointestinal: Significantly tender in his upper abdomen.  No distention. No abdominal bruits.  Musculoskeletal: No lower extremity tenderness nor edema.  No joint effusions. Neurologic:  Normal speech and language. No gross focal neurologic deficits are appreciated.  Skin:  Skin is warm, dry and intact. No rash noted. Psychiatric: Mood and affect are normal. Speech and behavior are normal. GU: Deferred   ____________________________________________   LABS (all labs ordered are listed, but only abnormal results are displayed)  Labs Reviewed   COMPREHENSIVE METABOLIC PANEL - Abnormal; Notable for the following components:      Result Value   Calcium 8.8 (*)    AST 75 (*)    ALT 50 (*)    Anion gap 18 (*)    All other components within normal limits  LIPASE, BLOOD - Abnormal; Notable for the following components:   Lipase 359 (*)    All other components within normal limits  ETHANOL - Abnormal; Notable for the following components:   Alcohol, Ethyl (B) 75 (*)    All other components within normal limits  RESPIRATORY PANEL BY RT PCR (FLU A&B, COVID)  CBC WITH DIFFERENTIAL/PLATELET  URINE DRUG SCREEN, QUALITATIVE ([facility] ONLY)  URINALYSIS, ROUTINE W REFLEX MICROSCOPIC  TROPONIN I (HIGH  SENSITIVITY)  TROPONIN I (HIGH SENSITIVITY)   ____________________________________________   ED ECG REPORT I, Concha Se, the attending physician, personally viewed and interpreted this ECG.  EKG is normal sinus rate of 95, no ST elevation, no T wave inversions, normal intervals ____________________________________________  RADIOLOGY Vela Prose, personally viewed and evaluated these images (plain radiographs) as part of my medical decision making, as well as reviewing the written report by the radiologist.  ED MD interpretation: Chest x-ray no free air  Official radiology report(s): DG Chest Portable 1 View  Result Date: 01/12/2020 CLINICAL DATA:  Right upper quadrant abdomen and chest pain. Shortness of breath. Smoker. EXAM: PORTABLE CHEST 1 VIEW COMPARISON:  04/18/2019 FINDINGS: Normal sized heart. Tortuous aorta. Clear lungs with normal vascularity. Normal appearing bones. IMPRESSION: No acute abnormality. Electronically Signed   By: Beckie Salts M.D.   On: 01/12/2020 11:00    ____________________________________________   PROCEDURES  Procedure(s) performed (including Critical Care):  Procedures   ____________________________________________   INITIAL IMPRESSION / ASSESSMENT AND PLAN / ED COURSE  Damire Remedios was  evaluated in Emergency Department on 01/12/2020 for the symptoms described in the history of present illness. He was evaluated in the context of the global COVID-19 pandemic, which necessitated consideration that the patient might be at risk for infection with the SARS-CoV-2 virus that causes COVID-19. Institutional protocols and algorithms that pertain to the evaluation of patients at risk for COVID-19 are in a state of rapid change based on information released by regulatory bodies including the CDC and federal and state organizations. These policies and algorithms were followed during the patient's care in the ED.    Patient is a 49 year old gentleman with a history of alcohol abuse who comes in with upper abdominal pain.  Patient is significantly tender on exam therefore I think she had a repeat CT imaging to evaluate for worsening necrosis, perforation, obstruction.  We'll get labs to evaluate for electrolyte abnormalities, AKI.  Low suspicion for ACS but will get troponin.  Trope negative  Lipase is elevated and is not as high as it was back in August but higher than when he was discharged.  His LFTs are also slightly elevated.   Pt still having pain. wILL D/W hospital for admission.       ____________________________________________   FINAL CLINICAL IMPRESSION(S) / ED DIAGNOSES   Final diagnoses:  Alcohol-induced acute pancreatitis, unspecified complication status      MEDICATIONS GIVEN DURING THIS VISIT:  Medications  HYDROmorphone (DILAUDID) injection 1 mg (1 mg Intravenous Given 01/12/20 1131)  iohexol (OMNIPAQUE) 300 MG/ML solution 100 mL (has no administration in time range)  sodium chloride 0.9 % bolus 1,000 mL (1,000 mLs Intravenous New Bag/Given 01/12/20 1130)  ondansetron (ZOFRAN) injection 4 mg (4 mg Intravenous Given 01/12/20 1131)     ED Discharge Orders    None       Note:  This document was prepared using Dragon voice recognition software and may include  unintentional dictation errors.   Concha Se, MD 01/12/20 [phone]

## 2020-01-12 NOTE — Progress Notes (Signed)
Significant other(Brenda) at bedside, states they told her she could stay since he was withdrawing, ans that she stayed with him last visit as well.

## 2020-01-12 NOTE — H&P (Signed)
History and Physical    Bruce [name] ONG:[id number] DOB: 09-26-1971 DOA: 01/12/2020  PCP: Patient, No Pcp Per   Patient coming from: Home  I have personally briefly reviewed patient's old medical records in [facility]  Chief Complaint: Abdominal pain  HPI: Bruce [name] is a 49 y.o. male with medical history significant for paroxysmal atrial fibrillation, poly substance abuse, Hepatitis C who presents to the emergency room for evaluation of abdominal pain mostly in the right upper quadrant and periumbilical area.  He rates his pain 10 x 10 in intensity at its worst and describes it as a constant pain with no relieving or aggravating factors of the last 24 hours.  Abdominal pain is associated with nausea and vomiting but he denies having any changes in his bowel habits.  He also complains of left-sided chest pain which is intermittent and non radiating. Denies having any associated symptoms of shortness of breath, dizziness, lightheadedness, cough, fever or chills.  Patient was admitted in the past for severe pancreatitis due to alcohol use. Patient admits to continued alcohol use but states that he cut back  ED Course: Patient with a known history of alcoholic pancreatitis who presents for evaluation of abdominal pain.  CT scan of abdomen and pelvis showed mild uncomplicated pancreatitis.  He is being admitted to the hospital for pain control  Review of Systems: As per HPI otherwise 10 point review of systems negative.    Past Medical History:  Diagnosis Date  . A-fib (HCC)   . Dental caries   . Hepatitis C infection   . Iritis   . PAF (paroxysmal atrial fibrillation) (HCC)    a. diagnosed 03/19/18; b. CHADS2VASc 0  . Polysubstance abuse (HCC)    a. cocaine, tobacco, etoh  . Scrotal mass   . Tick borne fever   . Uveitis     Past Surgical History:  Procedure Laterality Date  . FINGER SURGERY     infection, middle left  . fractured jaw    . SHOULDER SURGERY     right     reports that he has been smoking cigarettes. He has been smoking about 0.25 packs per day. He has never used smokeless tobacco. He reports current alcohol use. He reports current drug use. Drugs: IV and Cocaine.  Allergies  Allergen Reactions  . Penicillins Other (See Comments)    Childhood reaction    Family History  Problem Relation Age of Onset  . Drug abuse Father      Prior to Admission medications   Medication Sig Start Date End Date Taking? Authorizing Provider  Ensure Max Protein (ENSURE MAX PROTEIN) LIQD Take 330 mLs (11 oz total) by mouth 2 (two) times daily. 06/28/19   Alford Highland, MD  famotidine (PEPCID) 20 MG tablet Take 1 tablet (20 mg total) by mouth 2 (two) times daily. 06/28/19 09/26/19  Alford Highland, MD  ondansetron (ZOFRAN) 4 MG tablet Take 1 tablet (4 mg total) by mouth every 6 (six) hours as needed for nausea. 06/28/19   Alford Highland, MD  oxyCODONE (OXY IR/ROXICODONE) 5 MG immediate release tablet Take 1 tablet (5 mg total) by mouth every 6 (six) hours as needed for moderate pain or severe pain. 06/28/19   Alford Highland, MD  predniSONE (DELTASONE) 5 MG tablet 3 tabs po day1; 2 tabs po day 2; 1 tab po day 3; 1/2 tab po day4,5 06/29/19   Alford Highland, MD  tamsulosin (FLOMAX) 0.4 MG CAPS capsule Take 1 capsule (0.4 mg  total) by mouth daily. 06/28/19   Alford Highland, MD  thiamine 100 MG tablet Take 1 tablet (100 mg total) by mouth daily. 06/28/19   Alford Highland, MD    Physical Exam: Vitals:   01/12/20 1025 01/12/20 1315 01/12/20 1400 01/12/20 1415  BP:  (!) 156/98 134/79 133/72  Pulse:  86 78 77  Resp:  19 11 13   Temp:      TempSrc:      SpO2:  97% 93% 96%  Weight: 74.8 kg     Height: 5\' 7"  (1.702 m)        Vitals:   01/12/20 1025 01/12/20 1315 01/12/20 1400 01/12/20 1415  BP:  (!) 156/98 134/79 133/72  Pulse:  86 78 77  Resp:  19 11 13   Temp:      TempSrc:      SpO2:  97% 93% 96%  Weight: 74.8 kg     Height: 5\' 7"  (1.702 m)        Constitutional: NAD, alert and oriented to person, place and time.  Acutely ill-appearing Eyes: PERRL, lids and conjunctivae Pallor ENMT: Dry mucous membranes  Neck: normal, supple, no masses, no thyromegaly Respiratory: clear to auscultation bilaterally, no wheezing, no crackles. Normal respiratory effort. No accessory muscle use.  Cardiovascular: Tachycardic, no murmurs / rubs / gallops. No extremity edema. 2+ pedal pulses. No carotid bruits.  Abdomen: Tenderness in the right upper quadrant and peri-umbilical, no masses palpated. No hepatosplenomegaly. Bowel sounds positive.  Musculoskeletal: no clubbing / cyanosis. No joint deformity upper and lower extremities.  Guarding Skin: no rashes, lesions, ulcers.  Neurologic: No gross focal neurologic deficit. Psychiatric: Normal mood and affect.   Labs on Admission: I have personally reviewed following labs and imaging studies  CBC: Recent Labs  Lab 01/12/20 1123  WBC 5.8  NEUTROABS 4.2  HGB 15.2  HCT 42.3  MCV 89.8  PLT 182   Basic Metabolic Panel: Recent Labs  Lab 01/12/20 1123  NA 140  K 3.8  CL 100  CO2 22  GLUCOSE 88  BUN 9  CREATININE 0.73  CALCIUM 8.8*   GFR: Estimated Creatinine Clearance: 103.3 mL/min (by C-G formula based on SCr of 0.73 mg/dL). Liver Function Tests: Recent Labs  Lab 01/12/20 1123  AST 75*  ALT 50*  ALKPHOS 78  BILITOT 1.0  PROT 8.0  ALBUMIN 4.0   Recent Labs  Lab 01/12/20 1123  LIPASE 359*   No results for input(s): AMMONIA in the last 168 hours. Coagulation Profile: No results for input(s): INR, PROTIME in the last 168 hours. Cardiac Enzymes: No results for input(s): CKTOTAL, CKMB, CKMBINDEX, TROPONINI in the last 168 hours. BNP (last 3 results) No results for input(s): PROBNP in the last 8760 hours. HbA1C: No results for input(s): HGBA1C in the last 72 hours. CBG: No results for input(s): GLUCAP in the last 168 hours. Lipid Profile: No results for input(s): CHOL, HDL,  LDLCALC, TRIG, CHOLHDL, LDLDIRECT in the last 72 hours. Thyroid Function Tests: No results for input(s): TSH, T4TOTAL, FREET4, T3FREE, THYROIDAB in the last 72 hours. Anemia Panel: No results for input(s): VITAMINB12, FOLATE, FERRITIN, TIBC, IRON, RETICCTPCT in the last 72 hours. Urine analysis:    Component Value Date/Time   COLORURINE YELLOW (A) 01/12/2020 1240   APPEARANCEUR CLEAR (A) 01/12/2020 1240   LABSPEC 1.017 01/12/2020 1240   PHURINE 5.0 01/12/2020 1240   GLUCOSEU 50 (A) 01/12/2020 1240   HGBUR SMALL (A) 01/12/2020 1240   BILIRUBINUR NEGATIVE 01/12/2020 1240  KETONESUR 20 (A) 01/12/2020 1240   PROTEINUR 100 (A) 01/12/2020 1240   UROBILINOGEN 1.0 05/01/2012 0835   NITRITE NEGATIVE 01/12/2020 1240   LEUKOCYTESUR NEGATIVE 01/12/2020 1240    Radiological Exams on Admission: CT ABDOMEN PELVIS W CONTRAST  Result Date: 01/12/2020 CLINICAL DATA:  Left upper quadrant abdominal pain. Prior history of pancreatitis. EXAM: CT ABDOMEN AND PELVIS WITH CONTRAST TECHNIQUE: Multidetector CT imaging of the abdomen and pelvis was performed using the standard protocol following bolus administration of intravenous contrast. CONTRAST:  OMNIPAQUE IOHEXOL 300 MG/ML  SOLN COMPARISON:  CT scan 06/23/2019 FINDINGS: Lower chest: Minimal streaky dependent bibasilar atelectasis but no infiltrates or effusions. The heart is normal in size. No pericardial effusion. The distal esophagus is grossly normal. Hepatobiliary: No focal hepatic lesions or intrahepatic biliary dilatation. The gallbladder is normal. No common bile duct dilatation. Pancreas: Mild peripancreatic inflammatory changes and a small amount of fluid in the anterior pararenal spaces bilaterally. There is also some inflammation surrounding the second portion of duodenum. Normal pancreatic enhancement. No lesions or ductal dilatation. Spleen: Normal size. No focal lesions. Adrenals/Urinary Tract: The adrenal glands and kidneys are normal. The  bladder is normal. Stomach/Bowel: The stomach, duodenum, small bowel and colon are grossly normal without oral contrast. No inflammatory changes, mass lesions or obstructive findings. The appendix is normal. Vascular/Lymphatic: The aorta is normal in caliber. No dissection. The branch vessels are patent. The major venous structures are patent. No mesenteric or retroperitoneal mass or adenopathy. Small scattered lymph nodes are noted. Reproductive: The prostate gland and seminal vesicles are unremarkable. Moderate bilateral scrotal hydroceles noted. Other: No pelvic mass or adenopathy. Small amount of free pelvic fluid. No inguinal mass or adenopathy. No abdominal wall hernia or subcutaneous lesions. Musculoskeletal: No significant bony findings. IMPRESSION: 1. CT findings consistent with mild acute uncomplicated pancreatitis. 2. No other significant abdominal/pelvic findings, mass lesions or adenopathy. 3. Moderate bilateral scrotal hydroceles. Electronically Signed   By: Rudie Meyer M.D.   On: 01/12/2020 13:17   DG Chest Portable 1 View  Result Date: 01/12/2020 CLINICAL DATA:  Right upper quadrant abdomen and chest pain. Shortness of breath. Smoker. EXAM: PORTABLE CHEST 1 VIEW COMPARISON:  04/18/2019 FINDINGS: Normal sized heart. Tortuous aorta. Clear lungs with normal vascularity. Normal appearing bones. IMPRESSION: No acute abnormality. Electronically Signed   By: Beckie Salts M.D.   On: 01/12/2020 11:00    EKG: Independently reviewed.  Sinus rhythm  Assessment/Plan Principal Problem:   Pancreatitis Active Problems:   Hepatitis C infection   Thrombocytopenia (HCC)   Acute alcoholic pancreatitis Patient history of alcohol dependence who presents for evaluation of abdominal pain, imaging is suggestive of acute pancreatitis Will keep patient n.p.o. IV fluid hydration with banana bag Pain control and antiemetics Obtain abdominal ultrasound for further evaluation   Alcohol dependence Patient  has been advised to abstain from further alcohol use He is at increased risk for developing symptoms of alcohol withdrawal Will place patient on lorazepam per withdrawal protocol only to closely for signs of alcohol withdrawal   Hepatitis C infection Chronic Patient has elevated liver enzymes secondary to his underlying disease  History of paroxysmal atrial fibrillation Stable Twelve-lead EKG shows sinus rhythm   Left-sided chest pain doubt ACS Twelve-lead EKG does not show any acute findings Obtain serial cardiac enzymes  DVT prophylaxis: Lovenox Code Status: Full code Family Communication: Plan of care was discussed with patient, he verbalizes understanding and agrees with the plan Disposition Plan: Back to previous home environment Consults  called: None    Mayur Duman MD [facility]     01/12/2020, 2:23 PM

## 2020-01-13 DIAGNOSIS — F1010 Alcohol abuse, uncomplicated: Secondary | ICD-10-CM

## 2020-01-13 DIAGNOSIS — I480 Paroxysmal atrial fibrillation: Secondary | ICD-10-CM

## 2020-01-13 LAB — BASIC METABOLIC PANEL
Anion gap: 7 (ref 5–15)
BUN: 8 mg/dL (ref 6–20)
CO2: 27 mmol/L (ref 22–32)
Calcium: 8.3 mg/dL — ABNORMAL LOW (ref 8.9–10.3)
Chloride: 104 mmol/L (ref 98–111)
Creatinine, Ser: 0.74 mg/dL (ref 0.61–1.24)
GFR calc Af Amer: >60 mL/min (ref >60)
GFR calc non Af Amer: >60 mL/min (ref >60)
Glucose, Bld: 87 mg/dL (ref 70–99)
Potassium: 3.3 mmol/L — ABNORMAL LOW (ref 3.5–5.1)
Sodium: 138 mmol/L (ref 135–145)

## 2020-01-13 LAB — TRIGLYCERIDES: Triglycerides: 89 mg/dL (ref <150)

## 2020-01-13 LAB — LIPASE, BLOOD: Lipase: 111 U/L — ABNORMAL HIGH (ref 11–51)

## 2020-01-13 MED ORDER — ENOXAPARIN SODIUM 40 MG/0.4ML ~~LOC~~ SOLN: 40.0000 mg | SUBCUTANEOUS | Status: DC

## 2020-01-13 MED ORDER — THIAMINE HCL 100 MG/ML IJ SOLN
100.0000 mg | Freq: Every day | INTRAMUSCULAR | Status: DC
  Administered 2020-01-13 – 2020-01-14 (×2): 100 mg via INTRAVENOUS
  Filled 2020-01-13 (×2): qty 2

## 2020-01-13 MED ORDER — SODIUM CHLORIDE 0.9 % IV SOLN: INTRAVENOUS | Status: DC

## 2020-01-13 MED ORDER — PRENATAL MULTIVITAMIN CH: 1.0000 | ORAL_TABLET | Freq: Every day | ORAL | Status: DC

## 2020-01-13 MED ORDER — OXYCODONE HCL 5 MG PO TABS
5.0000 mg | ORAL_TABLET | ORAL | Status: DC | PRN
  Administered 2020-01-13 – 2020-01-14 (×4): 5 mg via ORAL
  Filled 2020-01-13 (×4): qty 1

## 2020-01-13 MED ORDER — FOLIC ACID 1 MG PO TABS
1.0000 mg | ORAL_TABLET | Freq: Every day | ORAL | Status: DC
  Administered 2020-01-13 – 2020-01-14 (×2): 1 mg via ORAL
  Filled 2020-01-13 (×2): qty 1

## 2020-01-13 MED ORDER — MORPHINE SULFATE (PF) 2 MG/ML IV SOLN
2.0000 mg | INTRAVENOUS | Status: DC | PRN
  Administered 2020-01-13 – 2020-01-14 (×4): 2 mg via INTRAVENOUS
  Filled 2020-01-13 (×4): qty 1

## 2020-01-13 MED ORDER — ADULT MULTIVITAMIN W/MINERALS CH
1.0000 | ORAL_TABLET | Freq: Every day | ORAL | Status: DC
  Administered 2020-01-13 – 2020-01-14 (×2): 1 via ORAL
  Filled 2020-01-13 (×2): qty 1

## 2020-01-13 MED ORDER — POTASSIUM CHLORIDE CRYS ER 20 MEQ PO TBCR
40.0000 meq | EXTENDED_RELEASE_TABLET | Freq: Once | ORAL | Status: AC
  Administered 2020-01-13: 40 meq via ORAL
  Filled 2020-01-13: qty 2

## 2020-01-13 NOTE — Progress Notes (Signed)
[name], male   DOB: May 03, 1971, 49 y.o.   MRN: [id number] [facility] PROGRESS NOTE  Saahil Kurek ONG:[id number] DOB: 01/16/1971 DOA: 01/12/2020 PCP: Patient, No Pcp Per  HPI/Subjective: Patient feels miserable.  Having abdominal pain throughout his entire abdomen.  States he drinks alcohol.  States he has had pancreatitis in the past.  Objective: Vitals:   01/13/20 0422 01/13/20 1148  BP: (!) 147/97 (!) 153/103  Pulse: 77 85  Resp: 16 18  Temp: 98.4 F (36.9 C) 98.1 F (36.7 C)  SpO2: 100% 100%    Intake/Output Summary (Last 24 hours) at 01/13/2020 1308 Last data filed at 01/13/2020 1107 Gross per 24 hour  Intake 692.11 ml  Output 1050 ml  Net -357.89 ml   Filed Weights   01/12/20 1025  Weight: 74.8 kg    ROS: Review of Systems  Constitutional: Negative for fever.  Eyes: Negative for blurred vision.  Respiratory: Negative for shortness of breath.   Cardiovascular: Negative for chest pain.  Gastrointestinal: Positive for abdominal pain. Negative for nausea and vomiting.  Genitourinary: Negative for dysuria.  Musculoskeletal: Negative for joint pain.  Neurological: Negative for headaches.   Exam: Physical Exam  Constitutional: He is oriented to person, place, and time.  HENT:  Mouth/Throat: No oropharyngeal exudate or posterior oropharyngeal edema.  Eyes: Conjunctivae and lids are normal.  Neck: Carotid bruit is not present.  Cardiovascular: S1 normal and S2 normal. Exam reveals no gallop.  No murmur heard. Respiratory: No respiratory distress. He has no wheezes. He has no rhonchi. He has no rales.  GI: Soft. There is abdominal tenderness.  Musculoskeletal:     Right ankle: No swelling.     Left ankle: No swelling.  Lymphadenopathy:    He has no cervical adenopathy.  Neurological: He is alert and oriented to person, place, and time.  Skin: Skin is warm. No rash noted. Nails show no clubbing.  Psychiatric: He has a normal mood and affect.       Data Reviewed: Basic Metabolic Panel: Recent Labs  Lab 01/12/20 1123 01/12/20 1446 01/13/20 0558  NA 140  --  138  K 3.8  --  3.3*  CL 100  --  104  CO2 22  --  27  GLUCOSE 88  --  87  BUN 9  --  8  CREATININE 0.73  --  0.74  CALCIUM 8.8*  --  8.3*  MG  --  1.7  --   PHOS  --  3.4  --    Liver Function Tests: Recent Labs  Lab 01/12/20 1123  AST 75*  ALT 50*  ALKPHOS 78  BILITOT 1.0  PROT 8.0  ALBUMIN 4.0   Recent Labs  Lab 01/12/20 1123 01/13/20 0558  LIPASE 359* 111*   CBC: Recent Labs  Lab 01/12/20 1123  WBC 5.8  NEUTROABS 4.2  HGB 15.2  HCT 42.3  MCV 89.8  PLT 182    CBG: Recent Labs  Lab 01/12/20 1555  GLUCAP 82    Recent Results (from the past 240 hour(s))  Respiratory Panel by RT PCR (Flu A&B, Covid) - Nasopharyngeal Swab     Status: None   Collection Time: 01/12/20 10:42 AM   Specimen: Nasopharyngeal Swab  Result Value Ref Range Status   SARS Coronavirus 2 by RT PCR NEGATIVE NEGATIVE Final    Comment: (NOTE) SARS-CoV-2 target nucleic acids are NOT DETECTED. The SARS-CoV-2 RNA is generally detectable in upper respiratoy specimens during the acute phase  of infection. The lowest concentration of SARS-CoV-2 viral copies this assay can detect is 131 copies/mL. A negative result does not preclude SARS-Cov-2 infection and should not be used as the sole basis for treatment or other patient management decisions. A negative result may occur with  improper specimen collection/handling, submission of specimen other than nasopharyngeal swab, presence of viral mutation(s) within the areas targeted by this assay, and inadequate number of viral copies (<131 copies/mL). A negative result must be combined with clinical observations, patient history, and epidemiological information. The expected result is Negative. Fact Sheet for Patients:  https://www.moore.com/ Fact Sheet for Healthcare Providers:   https://www.young.biz/ This test is not yet ap proved or cleared by the [place] FDA and  has been authorized for detection and/or diagnosis of SARS-CoV-2 by FDA under an Emergency Use Authorization (EUA). This EUA will remain  in effect (meaning this test can be used) for the duration of the COVID-19 declaration under Section 564(b)(1) of the Act, 21 U.S.C. section 360bbb-3(b)(1), unless the authorization is terminated or revoked sooner.    Influenza A by PCR NEGATIVE NEGATIVE Final   Influenza B by PCR NEGATIVE NEGATIVE Final    Comment: (NOTE) The Xpert Xpress SARS-CoV-2/FLU/RSV assay is intended as an aid in  the diagnosis of influenza from Nasopharyngeal swab specimens and  should not be used as a sole basis for treatment. Nasal washings and  aspirates are unacceptable for Xpert Xpress SARS-CoV-2/FLU/RSV  testing. Fact Sheet for Patients: https://www.moore.com/ Fact Sheet for Healthcare Providers: https://www.young.biz/ This test is not yet approved or cleared by the [place] FDA and  has been authorized for detection and/or diagnosis of SARS-CoV-2 by  FDA under an Emergency Use Authorization (EUA). This EUA will remain  in effect (meaning this test can be used) for the duration of the  Covid-19 declaration under Section 564(b)(1) of the Act, 21  U.S.C. section 360bbb-3(b)(1), unless the authorization is  terminated or revoked. Performed at [facility], [address], [place], [place] [address]      Studies: CT ABDOMEN PELVIS W CONTRAST  Result Date: 01/12/2020 CLINICAL DATA:  Left upper quadrant abdominal pain. Prior history of pancreatitis. EXAM: CT ABDOMEN AND PELVIS WITH CONTRAST TECHNIQUE: Multidetector CT imaging of the abdomen and pelvis was performed using the standard protocol following bolus administration of intravenous contrast. CONTRAST:  OMNIPAQUE IOHEXOL 300 MG/ML  SOLN  COMPARISON:  CT scan 06/23/2019 FINDINGS: Lower chest: Minimal streaky dependent bibasilar atelectasis but no infiltrates or effusions. The heart is normal in size. No pericardial effusion. The distal esophagus is grossly normal. Hepatobiliary: No focal hepatic lesions or intrahepatic biliary dilatation. The gallbladder is normal. No common bile duct dilatation. Pancreas: Mild peripancreatic inflammatory changes and a small amount of fluid in the anterior pararenal spaces bilaterally. There is also some inflammation surrounding the second portion of duodenum. Normal pancreatic enhancement. No lesions or ductal dilatation. Spleen: Normal size. No focal lesions. Adrenals/Urinary Tract: The adrenal glands and kidneys are normal. The bladder is normal. Stomach/Bowel: The stomach, duodenum, small bowel and colon are grossly normal without oral contrast. No inflammatory changes, mass lesions or obstructive findings. The appendix is normal. Vascular/Lymphatic: The aorta is normal in caliber. No dissection. The branch vessels are patent. The major venous structures are patent. No mesenteric or retroperitoneal mass or adenopathy. Small scattered lymph nodes are noted. Reproductive: The prostate gland and seminal vesicles are unremarkable. Moderate bilateral scrotal hydroceles noted. Other: No pelvic mass or adenopathy. Small amount of free pelvic  fluid. No inguinal mass or adenopathy. No abdominal wall hernia or subcutaneous lesions. Musculoskeletal: No significant bony findings. IMPRESSION: 1. CT findings consistent with mild acute uncomplicated pancreatitis. 2. No other significant abdominal/pelvic findings, mass lesions or adenopathy. 3. Moderate bilateral scrotal hydroceles. Electronically Signed   By: Rudie Meyer M.D.   On: 01/12/2020 13:17   DG Chest Portable 1 View  Result Date: 01/12/2020 CLINICAL DATA:  Right upper quadrant abdomen and chest pain. Shortness of breath. Smoker. EXAM: PORTABLE CHEST 1 VIEW  COMPARISON:  04/18/2019 FINDINGS: Normal sized heart. Tortuous aorta. Clear lungs with normal vascularity. Normal appearing bones. IMPRESSION: No acute abnormality. Electronically Signed   By: Beckie Salts M.D.   On: 01/12/2020 11:00   US Abdomen Limited RUQ  Result Date: 01/12/2020 CLINICAL DATA:  Right upper quadrant pain EXAM: ULTRASOUND ABDOMEN LIMITED RIGHT UPPER QUADRANT COMPARISON:  CT abdomen pelvis 01/12/2020 FINDINGS: Gallbladder: No gallstones or wall thickening visualized. No sonographic Murphy sign noted by sonographer. Common bile duct: Diameter: 0.3 cm Liver: No focal lesion identified. Mild diffuse increase in liver parenchymal echogenicity. Portal vein is patent on color Doppler imaging with normal direction of blood flow towards the liver. Other: None. IMPRESSION: 1.  No acute finding sonographically in the right upper quadrant. 2. Diffusely increased liver parenchymal echogenicity most commonly seen with hepatic steatosis. Electronically Signed   By: Emmaline Kluver M.D.   On: 01/12/2020 16:44    Scheduled Meds: . folic acid  1 mg Oral Daily  . LORazepam  0-4 mg Intravenous Q6H   Followed by  . [START ON 01/14/2020] LORazepam  0-4 mg Intravenous Q12H  . multivitamin with minerals  1 tablet Oral Daily  . pantoprazole (PROTONIX) IV  40 mg Intravenous Daily  . thiamine injection  100 mg Intravenous Daily   Continuous Infusions: . sodium chloride 75 mL/hr at 01/13/20 1107    Assessment/Plan:  1. Acute alcoholic pancreatitis.  Lipase trending in the right direction.  IV fluids started.  Started clear liquid diet.  As needed pain medication with IV and oral medications.  Patient advised to stop drinking alcohol.  Patient was last seen with acute pancreatitis back in August when I discharged him home after course of severe pancreatitis.  Add on triglycerides. 2. Alcohol abuse.  Patient placed on alcohol withdrawal protocol.  I ordered thiamine IV and folic acid and multivitamin  orally. 3. Paroxysmal atrial fibrillation.  Patient was seen by cardiology on last hospitalization not a good candidate for anticoagulation or antiarrhythmics. 4. History of hepatitis C 5. Patient has a history of thrombocytopenia on the last hospitalization.  I will discontinue Lovenox and Protonix secondary to this. 6. Hypokalemia replace potassium orally  Code Status:     Code Status Orders  (From admission, onward)         Start     Ordered   01/12/20 1432  Full code  Continuous     01/12/20 1435        Code Status History    Date Active Date Inactive Code Status Order ID Comments User Context   06/23/2019 0331 06/28/2019 1429 Full Code [id number]  Pearletha Alfred, NP ED   09/13/2018 2100 09/14/2018 1851 Full Code [id number]  Auburn Bilberry, MD Inpatient   03/19/2018 2027 03/20/2018 1444 Full Code [id number]  Adrian Saran, MD Inpatient   Advance Care Planning Activity     Family Communication: Permission to speak in front of girlfriend at the bedside Disposition Plan: Pancreatitis will need to resolve  and patient will need to tolerate solid food and have no pain prior to disposition home.  Likely will need a few more days in the hospital.  Time spent: 28 minutes  [employer]

## 2020-01-14 DIAGNOSIS — E876 Hypokalemia: Secondary | ICD-10-CM

## 2020-01-14 LAB — BASIC METABOLIC PANEL
Anion gap: 10 (ref 5–15)
BUN: <5 mg/dL — ABNORMAL LOW (ref 6–20)
CO2: 27 mmol/L (ref 22–32)
Calcium: 8.9 mg/dL (ref 8.9–10.3)
Chloride: 101 mmol/L (ref 98–111)
Creatinine, Ser: 0.69 mg/dL (ref 0.61–1.24)
GFR calc Af Amer: >60 mL/min (ref >60)
GFR calc non Af Amer: >60 mL/min (ref >60)
Glucose, Bld: 107 mg/dL — ABNORMAL HIGH (ref 70–99)
Potassium: 3.3 mmol/L — ABNORMAL LOW (ref 3.5–5.1)
Sodium: 138 mmol/L (ref 135–145)

## 2020-01-14 LAB — LIPASE, BLOOD: Lipase: 70 U/L — ABNORMAL HIGH (ref 11–51)

## 2020-01-14 MED ORDER — ONDANSETRON HCL 4 MG PO TABS: 4.0000 mg | ORAL_TABLET | Freq: Four times a day (QID) | ORAL | 0 refills | Status: DC | PRN

## 2020-01-14 MED ORDER — THIAMINE HCL 100 MG PO TABS: 100.0000 mg | ORAL_TABLET | Freq: Every day | ORAL | 0 refills | Status: DC

## 2020-01-14 MED ORDER — OXYCODONE HCL 5 MG PO TABS: 5.0000 mg | ORAL_TABLET | Freq: Four times a day (QID) | ORAL | 0 refills | Status: DC | PRN

## 2020-01-14 MED ORDER — FOLIC ACID 1 MG PO TABS: 1.0000 mg | ORAL_TABLET | Freq: Every day | ORAL | 0 refills | Status: DC

## 2020-01-14 NOTE — Progress Notes (Signed)
Bruce [name]  A and O x 4. VSS. Pt tolerating diet well. No complaints of pain or nausea. IV removed intact, prescriptions given. Pt voiced understanding of discharge instructions with no further questions. Pt discharged via wheelchair with NT.    Allergies as of 01/14/2020      Reactions   Penicillins Other (See Comments)   Childhood reaction      Medication List    TAKE these medications   acetaminophen 500 MG tablet Commonly known as: TYLENOL Take 500-1,000 mg by mouth every 6 (six) hours as needed for mild pain or fever.   diphenhydrAMINE 25 mg capsule Commonly known as: BENADRYL Take 25-50 mg by mouth daily as needed for itching, allergies or sleep.   folic acid 1 MG tablet Commonly known as: FOLVITE Take 1 tablet (1 mg total) by mouth daily. Start taking on: January 15, 2020   ondansetron 4 MG tablet Commonly known as: ZOFRAN Take 1 tablet (4 mg total) by mouth every 6 (six) hours as needed for nausea.   oxyCODONE 5 MG immediate release tablet Commonly known as: Oxy IR/ROXICODONE Take 1 tablet (5 mg total) by mouth every 6 (six) hours as needed for up to 10 doses for severe pain.   thiamine 100 MG tablet Take 1 tablet (100 mg total) by mouth daily.       Vitals:   01/13/20 2045 01/13/20 2119  BP: (!) 148/102 (!) 140/95  Pulse: 70 68  Resp: 20   Temp: 99 F (37.2 C)   SpO2: 99% 100%    Suzzanne Cloud

## 2020-01-14 NOTE — Discharge Instructions (Signed)
Acute Pancreatitis  The pancreas is a gland that is located behind the stomach on the left side of the abdomen. It produces enzymes that help to digest food. The pancreas also releases the hormones glucagon and insulin, which help to regulate blood sugar. Acute pancreatitis happens when inflammation of the pancreas suddenly occurs and the pancreas becomes irritated and swollen. Most acute attacks last a few days and cause serious problems. Some people become dehydrated and develop low blood pressure. In severe cases, bleeding in the abdomen can lead to shock and can be life-threatening. The lungs, heart, and kidneys may fail. What are the causes? This condition may be caused by:  Alcohol abuse.  Drug abuse.  Gallstones or other conditions that can block the tube that drains the pancreas (pancreatic duct).  A tumor in the pancreas. Other causes include:  Certain medicines.  Exposure to certain chemicals.  Diabetes.  An infection in the pancreas.  Damage caused by an accident (trauma).  The poison (venom) from a scorpion bite.  Abdominal surgery.  Autoimmune pancreatitis. This is when the body's disease-fighting (immune) system attacks the pancreas.  Genes that are passed from parent to child (inherited). In some cases, the cause of this condition is not known. What are the signs or symptoms? Symptoms of this condition include:  Pain in the upper abdomen that may radiate to the back. Pain may be severe.  Tenderness and swelling of the abdomen.  Nausea and vomiting.  Fever. How is this diagnosed? This condition may be diagnosed based on:  A physical exam.  Blood tests.  Imaging tests, such as X-rays, CT or MRI scans, or an ultrasound of the abdomen. How is this treated? Treatment for this condition usually requires a stay in the hospital. Treatment for this condition may include:  Pain medicine.  Fluid replacement through an IV.  Placing a tube in the stomach  to remove stomach contents and to control vomiting (NG tube, or nasogastric tube).  Not eating for 3-4 days. This gives the pancreas a rest, because enzymes are not being produced that can cause further damage.  Antibiotic medicines, if your condition is caused by an infection.  Treating any underlying conditions that may be the cause.  Steroid medicines, if your condition is caused by your immune system attacking your body's own tissues (autoimmune disease).  Surgery on the pancreas or gallbladder. Follow these instructions at home: Eating and drinking   Follow instructions from your health care provider about diet. This may involve avoiding alcohol and decreasing the amount of fat in your diet.  Eat smaller, more frequent meals. This reduces the amount of digestive fluids that the pancreas produces.  Drink enough fluid to keep your urine pale yellow.  Do not drink alcohol if it caused your condition. General instructions  Take over-the-counter and prescription medicines only as told by your health care provider.  Do not drive or use heavy machinery while taking prescription pain medicine.  Ask your health care provider if the medicine prescribed to you can cause constipation. You may need to take steps to prevent or treat constipation, such as: ? Take an over-the-counter or prescription medicine for constipation. ? Eat foods that are high in fiber such as whole grains and beans. ? Limit foods that are high in fat and processed sugars, such as fried or sweet foods.  Do not use any products that contain nicotine or tobacco, such as cigarettes, e-cigarettes, and chewing tobacco. If you need help quitting, ask your   health care provider.  Get plenty of rest.  If directed, check your blood sugar at home as told by your health care provider.  Keep all follow-up visits as told by your health care provider. This is important. Contact a health care provider if you:  Do not recover  as quickly as expected.  Develop new or worsening symptoms.  Have persistent pain, weakness, or nausea.  Recover and then have another episode of pain.  Have a fever. Get help right away if:  You cannot eat or keep fluids down.  Your pain becomes severe.  Your skin or the white part of your eyes turns yellow (jaundice).  You have sudden swelling in your abdomen.  You vomit.  You feel dizzy or you faint.  Your blood sugar is high (over 300 mg/dL). Summary  Acute pancreatitis happens when inflammation of the pancreas suddenly occurs and the pancreas becomes irritated and swollen.  This condition is typically caused by alcohol abuse, drug abuse, or gallstones.  Treatment for this condition usually requires a stay in the hospital. This information is not intended to replace advice given to you by your health care provider. Make sure you discuss any questions you have with your health care provider. Document Revised: 08/13/2018 Document Reviewed: 04/30/2018 Elsevier Patient Education  2020 [employer]  

## 2020-01-14 NOTE — Discharge Summary (Signed)
[place] [facility] [place] NAME: Bruce [name]    MR#:  [id number]  DATE OF BIRTH:  [birth year]  DATE OF ADMISSION:  01/12/2020 ADMITTING PHYSICIAN: Collier Bullock, MD  DATE OF DISCHARGE: 01/14/2020 12:10 PM  PRIMARY CARE PHYSICIAN: [facility]    ADMISSION DIAGNOSIS:  Acute pancreatitis [K85.90] Alcohol-induced acute pancreatitis, unspecified complication status [E52.77]  DISCHARGE DIAGNOSIS:  Principal Problem:   Pancreatitis Active Problems:   Hepatitis C infection   AF (paroxysmal atrial fibrillation) (HCC)   Thrombocytopenia (HCC)   Acute pancreatitis   Alcohol abuse   SECONDARY DIAGNOSIS:   Past Medical History:  Diagnosis Date  . A-fib ([place])   . Dental caries   . Hepatitis C infection   . Iritis   . PAF (paroxysmal atrial fibrillation) ([place])    a. diagnosed 03/19/18; b. CHADS2VASc 0  . Polysubstance abuse ([place])    a. cocaine, tobacco, etoh  . Scrotal mass   . Tick borne fever   . Uveitis     HOSPITAL COURSE:   1.  Acute alcoholic pancreatitis.  Lipase trending down in the right direction.  The patient actually ate breakfast this morning even though he was on a clear liquid diet.  He stated he felt well after doing this and wanted to go home.  Triglycerides normal range.  The patient was advised to stop drinking alcohol.  I reminded him that I saw him back when he had severe pancreatitis last year in August.  If he wants to avoid getting chronic pancreatitis he must stop drinking alcohol.  I prescribed very few pain pills and nausea medication upon going home. 2.  Alcohol abuse.  Patient was placed on alcohol withdrawal protocol.  Currently at the time of discharge did not have any signs of withdrawal.  I ordered oral thiamine and folic acid upon going home. 3.  Paroxysmal atrial fibrillation.  Patient was seen by cardiology on last hospitalization was not a good candidate for anticoagulation or antiarrhythmics. 4.  History  of hepatitis C 5.  Last hospitalization did have a history of thrombocytopenia after Lovenox and Protonix so I discontinued these medications on this hospital stay. 6.  Hypokalemia.  Now that he was eating normal diet this should not be an issue.  DISCHARGE CONDITIONS:   Satisfactory  CONSULTS OBTAINED:  None  DRUG ALLERGIES:   Allergies  Allergen Reactions  . Penicillins Other (See Comments)    Childhood reaction    DISCHARGE MEDICATIONS:   Allergies as of 01/14/2020      Reactions   Penicillins Other (See Comments)   Childhood reaction      Medication List    TAKE these medications   acetaminophen 500 MG tablet Commonly known as: TYLENOL Take 500-1,000 mg by mouth every 6 (six) hours as needed for mild pain or fever.   diphenhydrAMINE 25 mg capsule Commonly known as: BENADRYL Take 25-50 mg by mouth daily as needed for itching, allergies or sleep.   folic acid 1 MG tablet Commonly known as: FOLVITE Take 1 tablet (1 mg total) by mouth daily. Start taking on: January 15, 2020   ondansetron 4 MG tablet Commonly known as: ZOFRAN Take 1 tablet (4 mg total) by mouth every 6 (six) hours as needed for nausea.   oxyCODONE 5 MG immediate release tablet Commonly known as: Oxy IR/ROXICODONE Take 1 tablet (5 mg total) by mouth every 6 (six) hours as needed for up to 10 doses for severe pain.  thiamine 100 MG tablet Take 1 tablet (100 mg total) by mouth daily.        DISCHARGE INSTRUCTIONS:   [facility]  If you experience worsening of your admission symptoms, develop shortness of breath, life threatening emergency, suicidal or homicidal thoughts you must seek medical attention immediately by calling 911 or calling your MD immediately  if symptoms less severe.  You Must read complete instructions/literature along with all the possible adverse reactions/side effects for all the Medicines you take and that have been prescribed to you.  Take any new Medicines after you have completely understood and accept all the possible adverse reactions/side effects.   Please note  You were cared for by a hospitalist during your hospital stay. If you have any questions about your discharge medications or the care you received while you were in the hospital after you are discharged, you can call the unit and asked to speak with the hospitalist on call if the hospitalist that took care of you is not available. Once you are discharged, your primary care physician will handle any further medical issues. Please note that NO REFILLS for any discharge medications will be authorized once you are discharged, as it is imperative that you return to your primary care physician (or establish a relationship with a primary care physician if you do not have one) for your aftercare needs so that they can reassess your need for medications and monitor your lab values.    Today   CHIEF COMPLAINT:   Chief Complaint  Patient presents with  . Abdominal Pain  . Chest Pain    HISTORY OF PRESENT ILLNESS:  Bruce [name]  is a 49 y.o. male came in with chest pain and abdominal pain and found to have acute pancreatitis   VITAL SIGNS:  Blood pressure (!) 140/95, pulse 68, temperature 99 F (37.2 C), temperature source Oral, resp. rate 20, height 5\' 7"  (1.702 m), weight 74.8 kg, SpO2 100 %.  I/O:    Intake/Output Summary (Last 24 hours) at 01/14/2020 1352 Last data filed at 01/14/2020 1026 Gross per 24 hour  Intake 786.53 ml  Output 1450 ml  Net -663.47 ml    PHYSICAL EXAMINATION:  GENERAL:  49 y.o.-year-old patient lying in the bed with no acute distress.  EYES: Pupils equal, round, reactive to light and accommodation. No scleral icterus.  HEENT: Head atraumatic, normocephalic.  LUNGS: Normal breath sounds bilaterally, no wheezing, rales,rhonchi or crepitation. No use of accessory muscles of respiration.  CARDIOVASCULAR: S1, S2 normal. No murmurs, rubs,  or gallops.  ABDOMEN: Soft, slight tenderness, non-distended. EXTREMITIES: No pedal edema, cyanosis, or clubbing.  NEUROLOGIC: Cranial nerves II through XII are intact. Muscle strength 5/5 in all extremities. Sensation intact. Gait not checked.  PSYCHIATRIC: The patient is alert and oriented x 3.  SKIN: No obvious rash, lesion, or ulcer.   DATA REVIEW:   CBC Recent Labs  Lab 01/12/20 1123  WBC 5.8  HGB 15.2  HCT 42.3  PLT 182    Chemistries  Recent Labs  Lab 01/12/20 1123 01/12/20 1446 01/13/20 0558 01/14/20 0828  NA 140  --    < > 138  K 3.8  --    < > 3.3*  CL 100  --    < > 101  CO2 22  --    < > 27  GLUCOSE 88  --    < > 107*  BUN 9  --    < > <  5*  CREATININE 0.73  --    < > 0.69  CALCIUM 8.8*  --    < > 8.9  MG  --  1.7  --   --   AST 75*  --   --   --   ALT 50*  --   --   --   ALKPHOS 78  --   --   --   BILITOT 1.0  --   --   --    < > = values in this interval not displayed.     Microbiology Results  Results for orders placed or performed during the hospital encounter of 01/12/20  Respiratory Panel by RT PCR (Flu A&B, Covid) - Nasopharyngeal Swab     Status: None   Collection Time: 01/12/20 10:42 AM   Specimen: Nasopharyngeal Swab  Result Value Ref Range Status   SARS Coronavirus 2 by RT PCR NEGATIVE NEGATIVE Final    Comment: (NOTE) SARS-CoV-2 target nucleic acids are NOT DETECTED. The SARS-CoV-2 RNA is generally detectable in upper respiratoy specimens during the acute phase of infection. The lowest concentration of SARS-CoV-2 viral copies this assay can detect is 131 copies/mL. A negative result does not preclude SARS-Cov-2 infection and should not be used as the sole basis for treatment or other patient management decisions. A negative result may occur with  improper specimen collection/handling, submission of specimen other than nasopharyngeal swab, presence of viral mutation(s) within the areas targeted by this assay, and inadequate number of  viral copies (<131 copies/mL). A negative result must be combined with clinical observations, patient history, and epidemiological information. The expected result is Negative. Fact Sheet for Patients:  https://www.moore.com/ Fact Sheet for Healthcare Providers:  https://www.young.biz/ This test is not yet ap proved or cleared by the [place] FDA and  has been authorized for detection and/or diagnosis of SARS-CoV-2 by FDA under an Emergency Use Authorization (EUA). This EUA will remain  in effect (meaning this test can be used) for the duration of the COVID-19 declaration under Section 564(b)(1) of the Act, 21 U.S.C. section 360bbb-3(b)(1), unless the authorization is terminated or revoked sooner.    Influenza A by PCR NEGATIVE NEGATIVE Final   Influenza B by PCR NEGATIVE NEGATIVE Final    Comment: (NOTE) The Xpert Xpress SARS-CoV-2/FLU/RSV assay is intended as an aid in  the diagnosis of influenza from Nasopharyngeal swab specimens and  should not be used as a sole basis for treatment. Nasal washings and  aspirates are unacceptable for Xpert Xpress SARS-CoV-2/FLU/RSV  testing. Fact Sheet for Patients: https://www.moore.com/ Fact Sheet for Healthcare Providers: https://www.young.biz/ This test is not yet approved or cleared by the [place] FDA and  has been authorized for detection and/or diagnosis of SARS-CoV-2 by  FDA under an Emergency Use Authorization (EUA). This EUA will remain  in effect (meaning this test can be used) for the duration of the  Covid-19 declaration under Section 564(b)(1) of the Act, 21  U.S.C. section 360bbb-3(b)(1), unless the authorization is  terminated or revoked. Performed at [facility], [address] [place], [place] [address]     RADIOLOGY:  US Abdomen Limited RUQ  Result Date: 01/12/2020 CLINICAL DATA:  Right upper quadrant pain EXAM: ULTRASOUND  ABDOMEN LIMITED RIGHT UPPER QUADRANT COMPARISON:  CT abdomen pelvis 01/12/2020 FINDINGS: Gallbladder: No gallstones or wall thickening visualized. No sonographic Murphy sign noted by sonographer. Common bile duct: Diameter: 0.3 cm Liver: No focal lesion identified. Mild diffuse increase in liver parenchymal echogenicity. Portal  vein is patent on color Doppler imaging with normal direction of blood flow towards the liver. Other: None. IMPRESSION: 1.  No acute finding sonographically in the right upper quadrant. 2. Diffusely increased liver parenchymal echogenicity most commonly seen with hepatic steatosis. Electronically Signed   By: Emmaline Kluver M.D.   On: 01/12/2020 16:44    Management plans discussed with the patient, family and they are in agreement.  CODE STATUS:     Code Status Orders  (From admission, onward)         Start     Ordered   01/12/20 1432  Full code  Continuous     01/12/20 1435        Code Status History    Date Active Date Inactive Code Status Order ID Comments User Context   06/23/2019 0331 06/28/2019 1429 Full Code [id number]  Pearletha Alfred, NP ED   09/13/2018 2100 09/14/2018 1851 Full Code [id number]  Auburn Bilberry, MD Inpatient   03/19/2018 2027 03/20/2018 1444 Full Code [id number]  Adrian Saran, MD Inpatient   Advance Care Planning Activity      TOTAL TIME TAKING CARE OF THIS PATIENT: 33 minutes.    Alford Highland M.D on 01/14/2020 at 1:52 PM  Between 7am to 6pm - Pager - [phone]  After 6pm go to [email or web address] - password [email or web address]  [facility]  CC: Primary care physician; [facility]

## 2020-01-14 NOTE — TOC Transition Note (Signed)
Transition of Care [facility]) - CM/SW Discharge Note   Patient Details  Name: Bruce [name] MRN: [id number] Date of Birth: 1971/10/23  Transition of Care [facility] CM/SW Contact:  Magnus Ivan, LCSW Phone Number: 01/14/2020, 10:40 AM   Clinical Narrative:   CSW met with patient to provide PCP information due to patient being uninsured. Provided [facility]/Medication Management application as well as free/low cost healthcare booklet. Patient has orders to discharge home today. No needs identified. CSW signing off.          Patient Goals and CMS Choice        Discharge Placement                       Discharge Plan and Services                                     Social Determinants of Health (SDOH) Interventions     Readmission Risk Interventions No flowsheet data found.

## 2020-01-22 ENCOUNTER — Telehealth: Payer: Self-pay | Admitting: General Practice

## 2020-01-22 NOTE — Telephone Encounter (Signed)
I attempted to call pt at 12:38pm on 3/17 to answer any questions about the [facility] application he might've had. I was able to leave a vm and will try again at a later time

## 2020-03-09 ENCOUNTER — Emergency Department: Payer: Self-pay

## 2020-03-09 ENCOUNTER — Other Ambulatory Visit: Payer: Self-pay

## 2020-03-09 ENCOUNTER — Encounter: Payer: Self-pay | Admitting: Emergency Medicine

## 2020-03-09 ENCOUNTER — Inpatient Hospital Stay
Admission: EM | Admit: 2020-03-09 | Discharge: 2020-03-15 | DRG: 439 | Disposition: A | Discharge status: Home / Self Care | Payer: Self-pay | Attending: Internal Medicine | Admitting: Internal Medicine

## 2020-03-09 DIAGNOSIS — B1920 Unspecified viral hepatitis C without hepatic coma: Secondary | ICD-10-CM | POA: Diagnosis present

## 2020-03-09 DIAGNOSIS — F1910 Other psychoactive substance abuse, uncomplicated: Secondary | ICD-10-CM

## 2020-03-09 DIAGNOSIS — N401 Enlarged prostate with lower urinary tract symptoms: Secondary | ICD-10-CM | POA: Diagnosis present

## 2020-03-09 DIAGNOSIS — I10 Essential (primary) hypertension: Secondary | ICD-10-CM | POA: Diagnosis present

## 2020-03-09 DIAGNOSIS — Z8619 Personal history of other infectious and parasitic diseases: Secondary | ICD-10-CM | POA: Diagnosis present

## 2020-03-09 DIAGNOSIS — R350 Frequency of micturition: Secondary | ICD-10-CM | POA: Diagnosis present

## 2020-03-09 DIAGNOSIS — K852 Alcohol induced acute pancreatitis without necrosis or infection: Principal | ICD-10-CM | POA: Diagnosis present

## 2020-03-09 DIAGNOSIS — R351 Nocturia: Secondary | ICD-10-CM | POA: Diagnosis present

## 2020-03-09 DIAGNOSIS — F17210 Nicotine dependence, cigarettes, uncomplicated: Secondary | ICD-10-CM | POA: Diagnosis present

## 2020-03-09 DIAGNOSIS — K859 Acute pancreatitis without necrosis or infection, unspecified: Secondary | ICD-10-CM | POA: Diagnosis present

## 2020-03-09 DIAGNOSIS — E876 Hypokalemia: Secondary | ICD-10-CM | POA: Diagnosis not present

## 2020-03-09 DIAGNOSIS — I480 Paroxysmal atrial fibrillation: Secondary | ICD-10-CM | POA: Diagnosis present

## 2020-03-09 DIAGNOSIS — Z20822 Contact with and (suspected) exposure to covid-19: Secondary | ICD-10-CM | POA: Diagnosis present

## 2020-03-09 DIAGNOSIS — F10139 Alcohol abuse with withdrawal, unspecified: Secondary | ICD-10-CM | POA: Diagnosis present

## 2020-03-09 LAB — HEPATIC FUNCTION PANEL
ALT: 76 U/L — ABNORMAL HIGH (ref 0–44)
AST: 129 U/L — ABNORMAL HIGH (ref 15–41)
Albumin: 4.3 g/dL (ref 3.5–5.0)
Alkaline Phosphatase: 104 U/L (ref 38–126)
Bilirubin, Direct: 0.5 mg/dL — ABNORMAL HIGH (ref 0.0–0.2)
Indirect Bilirubin: 1.6 mg/dL — ABNORMAL HIGH (ref 0.3–0.9)
Total Bilirubin: 2.1 mg/dL — ABNORMAL HIGH (ref 0.3–1.2)
Total Protein: 8.7 g/dL — ABNORMAL HIGH (ref 6.5–8.1)

## 2020-03-09 LAB — CBC
HCT: 47.3 % (ref 39.0–52.0)
Hemoglobin: 16.8 g/dL (ref 13.0–17.0)
MCH: 31.9 pg (ref 26.0–34.0)
MCHC: 35.5 g/dL (ref 30.0–36.0)
MCV: 89.9 fL (ref 80.0–100.0)
Platelets: 172 10*3/uL (ref 150–400)
RBC: 5.26 MIL/uL (ref 4.22–5.81)
RDW: 12 % (ref 11.5–15.5)
WBC: 4.7 10*3/uL (ref 4.0–10.5)
nRBC: 0 % (ref 0.0–0.2)

## 2020-03-09 LAB — RESPIRATORY PANEL BY RT PCR (FLU A&B, COVID)
Influenza A by PCR: NEGATIVE
Influenza B by PCR: NEGATIVE
SARS Coronavirus 2 by RT PCR: NEGATIVE

## 2020-03-09 LAB — URINALYSIS, COMPLETE (UACMP) WITH MICROSCOPIC
Bacteria, UA: NONE SEEN
Glucose, UA: NEGATIVE mg/dL
Hgb urine dipstick: NEGATIVE
Ketones, ur: 40 mg/dL — AB
Leukocytes,Ua: NEGATIVE
Nitrite: NEGATIVE
Protein, ur: 30 mg/dL — AB
RBC / HPF: NONE SEEN RBC/hpf (ref 0–5)
Specific Gravity, Urine: 1.02 (ref 1.005–1.030)
WBC, UA: NONE SEEN WBC/hpf (ref 0–5)
pH: 6 (ref 5.0–8.0)

## 2020-03-09 LAB — BASIC METABOLIC PANEL
Anion gap: 15 (ref 5–15)
BUN: 8 mg/dL (ref 6–20)
CO2: 21 mmol/L — ABNORMAL LOW (ref 22–32)
Calcium: 9.5 mg/dL (ref 8.9–10.3)
Chloride: 105 mmol/L (ref 98–111)
Creatinine, Ser: 0.7 mg/dL (ref 0.61–1.24)
GFR calc Af Amer: >60 mL/min (ref >60)
GFR calc non Af Amer: >60 mL/min (ref >60)
Glucose, Bld: 106 mg/dL — ABNORMAL HIGH (ref 70–99)
Potassium: 3.8 mmol/L (ref 3.5–5.1)
Sodium: 141 mmol/L (ref 135–145)

## 2020-03-09 LAB — TROPONIN I (HIGH SENSITIVITY): Troponin I (High Sensitivity): 15 ng/L (ref <18)

## 2020-03-09 LAB — LIPASE, BLOOD: Lipase: 589 U/L — ABNORMAL HIGH (ref 11–51)

## 2020-03-09 MED ORDER — LORAZEPAM 2 MG PO TABS: 0.0000 mg | ORAL_TABLET | Freq: Two times a day (BID) | ORAL | Status: AC

## 2020-03-09 MED ORDER — DIPHENHYDRAMINE HCL 25 MG PO CAPS: 25.0000 mg | ORAL_CAPSULE | Freq: Four times a day (QID) | ORAL | Status: DC | PRN

## 2020-03-09 MED ORDER — ONDANSETRON HCL 4 MG/2ML IJ SOLN
4.0000 mg | Freq: Once | INTRAMUSCULAR | Status: AC
  Administered 2020-03-09: 10:00:00 4 mg via INTRAVENOUS
  Filled 2020-03-09: qty 2

## 2020-03-09 MED ORDER — HYDROMORPHONE HCL 1 MG/ML IJ SOLN
1.0000 mg | Freq: Once | INTRAMUSCULAR | Status: AC
  Administered 2020-03-09: 14:00:00 1 mg via INTRAVENOUS
  Filled 2020-03-09: qty 1

## 2020-03-09 MED ORDER — ONDANSETRON HCL 4 MG PO TABS: 4.0000 mg | ORAL_TABLET | Freq: Four times a day (QID) | ORAL | Status: DC | PRN

## 2020-03-09 MED ORDER — LORAZEPAM 2 MG/ML IJ SOLN: 0.0000 mg | Freq: Four times a day (QID) | INTRAMUSCULAR | Status: AC

## 2020-03-09 MED ORDER — HYDROMORPHONE HCL 1 MG/ML IJ SOLN
1.0000 mg | Freq: Once | INTRAMUSCULAR | Status: AC
  Administered 2020-03-09: 1 mg via INTRAVENOUS
  Filled 2020-03-09: qty 1

## 2020-03-09 MED ORDER — THIAMINE HCL 100 MG PO TABS
100.0000 mg | ORAL_TABLET | Freq: Every day | ORAL | Status: DC
  Administered 2020-03-11 – 2020-03-15 (×5): 100 mg via ORAL
  Filled 2020-03-09 (×5): qty 1

## 2020-03-09 MED ORDER — ACETAMINOPHEN 500 MG PO TABS
1000.0000 mg | ORAL_TABLET | Freq: Once | ORAL | Status: AC
  Administered 2020-03-09: 10:00:00 1000 mg via ORAL
  Filled 2020-03-09: qty 2

## 2020-03-09 MED ORDER — SODIUM CHLORIDE 0.9% FLUSH
3.0000 mL | Freq: Once | INTRAVENOUS | Status: AC
  Administered 2020-03-09: 10:00:00 3 mL via INTRAVENOUS

## 2020-03-09 MED ORDER — CAMPHOR-MENTHOL 0.5-0.5 % EX LOTN
TOPICAL_LOTION | CUTANEOUS | Status: DC | PRN
  Filled 2020-03-09: qty 222

## 2020-03-09 MED ORDER — DIPHENHYDRAMINE HCL 50 MG/ML IJ SOLN
12.5000 mg | Freq: Once | INTRAMUSCULAR | Status: DC
  Filled 2020-03-09: qty 1

## 2020-03-09 MED ORDER — LORAZEPAM 2 MG/ML IJ SOLN
1.0000 mg | Freq: Once | INTRAMUSCULAR | Status: AC
  Administered 2020-03-09: 10:00:00 1 mg via INTRAVENOUS
  Filled 2020-03-09: qty 1

## 2020-03-09 MED ORDER — LORAZEPAM 2 MG PO TABS: 0.0000 mg | ORAL_TABLET | Freq: Four times a day (QID) | ORAL | Status: AC

## 2020-03-09 MED ORDER — SODIUM CHLORIDE 0.9 % IV BOLUS
1000.0000 mL | Freq: Once | INTRAVENOUS | Status: AC
  Administered 2020-03-09: 1000 mL via INTRAVENOUS

## 2020-03-09 MED ORDER — LORAZEPAM 2 MG/ML IJ SOLN: 0.0000 mg | Freq: Two times a day (BID) | INTRAMUSCULAR | Status: AC

## 2020-03-09 MED ORDER — HYDROMORPHONE HCL 1 MG/ML IJ SOLN
0.5000 mg | INTRAMUSCULAR | Status: DC | PRN
  Administered 2020-03-09 – 2020-03-10 (×4): 0.5 mg via INTRAVENOUS
  Filled 2020-03-09 (×4): qty 1

## 2020-03-09 MED ORDER — POTASSIUM CHLORIDE IN NACL 20-0.9 MEQ/L-% IV SOLN
INTRAVENOUS | Status: DC
  Filled 2020-03-09 (×10): qty 1000

## 2020-03-09 MED ORDER — IOHEXOL 300 MG/ML  SOLN
100.0000 mL | Freq: Once | INTRAMUSCULAR | Status: AC | PRN
  Administered 2020-03-09: 12:00:00 100 mL via INTRAVENOUS

## 2020-03-09 MED ORDER — SODIUM CHLORIDE 0.9 % IV BOLUS
1000.0000 mL | Freq: Once | INTRAVENOUS | Status: AC
  Administered 2020-03-09: 14:00:00 1000 mL via INTRAVENOUS

## 2020-03-09 MED ORDER — ONDANSETRON HCL 4 MG/2ML IJ SOLN
4.0000 mg | Freq: Four times a day (QID) | INTRAMUSCULAR | Status: DC | PRN
  Administered 2020-03-09: 18:00:00 4 mg via INTRAVENOUS
  Filled 2020-03-09: qty 2

## 2020-03-09 MED ORDER — THIAMINE HCL 100 MG/ML IJ SOLN
100.0000 mg | Freq: Every day | INTRAMUSCULAR | Status: DC
  Administered 2020-03-09 – 2020-03-10 (×2): 100 mg via INTRAVENOUS
  Filled 2020-03-09 (×3): qty 2

## 2020-03-09 NOTE — ED Notes (Signed)
First Nurse Note: Pt to ED via POV c/o chest and abd pain. Pt dry heaving in lobby. Pt appears uncomfortable

## 2020-03-09 NOTE — ED Provider Notes (Signed)
[facility] Emergency Department Provider Note  ____________________________________________   First MD Initiated Contact with Patient 03/09/20 [phone]     (approximate)  I have reviewed the triage vital signs and the nursing notes.   HISTORY  Chief Complaint Chest Pain and Abdominal Cramping    HPI Bruce [name] is a 49 y.o. male with atrial fibrillation, hepatitis C, polysubstance abuse who comes in with abdominal pain.  Patient reports most notable lower abdominal pain that is constant, severe, started 3 days ago, nothing made it better, nothing makes it worse.  Patient has a history of alcohol pancreatitis states that this feels similar.  He states the pain radiates up into his upper abdomen.  He reports some nausea and vomiting.  He states that he has been cutting down his alcohol has not drank for 3 days.  He also reports some chest discomfort that is intermittent, not as severe as the abdominal pain.  Does report a little bit of dysuria as well          Past Medical History:  Diagnosis Date  . A-fib (HCC)   . Dental caries   . Hepatitis C infection   . Iritis   . PAF (paroxysmal atrial fibrillation) (HCC)    a. diagnosed 03/19/18; b. CHADS2VASc 0  . Polysubstance abuse (HCC)    a. cocaine, tobacco, etoh  . Scrotal mass   . Tick borne fever   . Uveitis     Patient Active Problem List   Diagnosis Date Noted  . Hypokalemia   . Alcohol abuse   . Acute pancreatitis 01/12/2020  . Thrombocytopenia (HCC) 06/25/2019  . Elevated troponin   . Pancreatitis 06/23/2019  . AF (paroxysmal atrial fibrillation) (HCC) 09/13/2018  . Atrial fibrillation (HCC) 03/19/2018  . Abnormality of gait 02/12/2013  . Mandibular fracture (HCC) 12/25/2012  . Flu vaccine need 12/25/2012  . Scrotal mass 05/22/2012  . Dental caries 05/22/2012  . Hepatitis C infection 05/16/2012  . Myalgia 05/06/2012  . Arthralgia 05/06/2012  . Tick borne fever, likely 05/05/2012  .  Tinea cruris, likely 05/05/2012  . Spermatocele of epididymis 05/01/2012  . Bilateral varicoceles 05/01/2012  . Bilateral hydrocele 05/01/2012    Past Surgical History:  Procedure Laterality Date  . FINGER SURGERY     infection, middle left  . fractured jaw    . SHOULDER SURGERY     right    Prior to Admission medications   Medication Sig Start Date End Date Taking? Authorizing Provider  acetaminophen (TYLENOL) 500 MG tablet Take 500-1,000 mg by mouth every 6 (six) hours as needed for mild pain or fever.    [provider]  diphenhydrAMINE (BENADRYL) 25 mg capsule Take 25-50 mg by mouth daily as needed for itching, allergies or sleep.    [provider]  folic acid (FOLVITE) 1 MG tablet Take 1 tablet (1 mg total) by mouth daily. 01/15/20   Alford HighlandWieting, Richard, MD  ondansetron (ZOFRAN) 4 MG tablet Take 1 tablet (4 mg total) by mouth every 6 (six) hours as needed for nausea. 01/14/20   Alford HighlandWieting, Richard, MD  oxyCODONE (OXY IR/ROXICODONE) 5 MG immediate release tablet Take 1 tablet (5 mg total) by mouth every 6 (six) hours as needed for up to 10 doses for severe pain. 01/14/20   Alford HighlandWieting, Richard, MD  thiamine 100 MG tablet Take 1 tablet (100 mg total) by mouth daily. 01/14/20   Alford HighlandWieting, Richard, MD    Allergies Penicillins  Family History  Problem  Relation Age of Onset  . Drug abuse Father     Social History Social History   Tobacco Use  . Smoking status: Current Some Day Smoker    Packs/day: 0.25    Types: Cigarettes  . Smokeless tobacco: Never Used  . Tobacco comment: Per pt  " I only smoke cigarettes when I drink beer"  Substance Use Topics  . Alcohol use: Yes    Comment: 6 pack per week  . Drug use: Yes    Types: IV, Cocaine    Comment: Pt states that he last used "a while back"      Review of Systems Constitutional: No fever/chills Eyes: No visual changes. ENT: No sore throat. Cardiovascular: Positive chest pain Respiratory: Denies shortness of  breath. Gastrointestinal: Positive abdominal pain, nausea, vomiting no diarrhea.  No constipation. Genitourinary: Negative for dysuria. Musculoskeletal: Negative for back pain. Skin: Negative for rash. Neurological: Negative for headaches, focal weakness or numbness. All other ROS negative ____________________________________________   PHYSICAL EXAM:  VITAL SIGNS: ED Triage Vitals  Enc Vitals Group     BP 03/09/20 0916 (!) 148/104     Pulse Rate 03/09/20 0916 (!) 110     Resp 03/09/20 0916 (!) 22     Temp 03/09/20 0916 97.6 F (36.4 C)     Temp Source 03/09/20 0916 Oral     SpO2 03/09/20 0916 100 %     Weight 03/09/20 0914 165 lb (74.8 kg)     Height 03/09/20 0914 5\' 7"  (1.702 m)     Head Circumference --      Peak Flow --      Pain Score 03/09/20 0913 10     Pain Loc --      Pain Edu? --      Excl. in [place]? --     Constitutional: Alert and oriented.  Patient appears to be in pain but is also tremulous Eyes: Conjunctivae are normal. EOMI. Head: Atraumatic. Nose: No congestion/rhinnorhea. Mouth/Throat: Mucous membranes are moist.   Neck: No stridor. Trachea Midline. FROM Cardiovascular: Tachycardic, regular rhythm. Grossly normal heart sounds.  Good peripheral circulation. Respiratory: Normal respiratory effort.  No retractions. Lungs CTAB. Gastrointestinal: Tender throughout his abdomen.  Wound most notable around his umbilicus.  No distention. No abdominal bruits.  Musculoskeletal: No lower extremity tenderness nor edema.  No joint effusions.  Patient is significantly tremulous Neurologic:  Normal speech and language. No gross focal neurologic deficits are appreciated.  Skin:  Skin is warm, dry and intact. No rash noted. Psychiatric: Mood and affect are normal. Speech and behavior are normal. GU: Deferred   ____________________________________________   LABS (all labs ordered are listed, but only abnormal results are displayed)  Labs Reviewed  BASIC METABOLIC PANEL  - Abnormal; Notable for the following components:      Result Value   CO2 21 (*)    Glucose, Bld 106 (*)    All other components within normal limits  LIPASE, BLOOD - Abnormal; Notable for the following components:   Lipase 589 (*)    All other components within normal limits  HEPATIC FUNCTION PANEL - Abnormal; Notable for the following components:   Total Protein 8.7 (*)    AST 129 (*)    ALT 76 (*)    Total Bilirubin 2.1 (*)    Bilirubin, Direct 0.5 (*)    Indirect Bilirubin 1.6 (*)    All other components within normal limits  URINALYSIS, COMPLETE (UACMP) WITH MICROSCOPIC - Abnormal; Notable for the  following components:   Bilirubin Urine SMALL (*)    Ketones, ur 40 (*)    Protein, ur 30 (*)    All other components within normal limits  RESPIRATORY PANEL BY RT PCR (FLU A&B, COVID)  CBC  TROPONIN I (HIGH SENSITIVITY)  TROPONIN I (HIGH SENSITIVITY)   ____________________________________________   ED ECG REPORT I, Concha Se, the attending physician, personally viewed and interpreted this ECG.  EKG sinus tachycardia rate of 109, no ST elevation, some T wave inversions in 3, aVF, normal intervals ____________________________________________  RADIOLOGY Vela Prose, personally viewed and evaluated these images (plain radiographs) as part of my medical decision making, as well as reviewing the written report by the radiologist.  ED MD interpretation: No pneumonia  Official radiology report(s): DG Chest 2 View  Result Date: 03/09/2020 CLINICAL DATA:  Chest pain. Additional history provided: Left-sided chest pain and lower abdominal pain since yesterday. EXAM: CHEST - 2 VIEW COMPARISON:  Chest radiograph 03/13/2020 FINDINGS: Heart size within normal limits. There is no appreciable airspace consolidation. No evidence of pleural effusion or pneumothorax. No acute bony abnormality identified. IMPRESSION: No evidence of acute cardiopulmonary abnormality. Electronically Signed    By: Jackey Loge DO   On: 03/09/2020 09:56   CT ABDOMEN PELVIS W CONTRAST  Result Date: 03/09/2020 CLINICAL DATA:  Abdominal pain, nausea and distension since yesterday. History of hepatitis-C. EXAM: CT ABDOMEN AND PELVIS WITH CONTRAST TECHNIQUE: Multidetector CT imaging of the abdomen and pelvis was performed using the standard protocol following bolus administration of intravenous contrast. CONTRAST:  OMNIPAQUE IOHEXOL 300 MG/ML  SOLN COMPARISON:  01/12/2020 FINDINGS: Lower chest: Unremarkable. Hepatobiliary: Stable diffuse low density of the liver relative to the spleen. Normal appearing gallbladder. Pancreas: Mild diffuse peripancreatic edema and soft tissue stranding without significant change. No interval fluid collections. Spleen: Normal in size without focal abnormality. Adrenals/Urinary Tract: Adrenal glands are unremarkable. Kidneys are normal, without renal calculi, focal lesion, or hydronephrosis. Bladder is unremarkable. Stomach/Bowel: Stomach is within normal limits. Appendix appears normal. No evidence of bowel wall thickening, distention, or inflammatory changes. Vascular/Lymphatic: No significant vascular findings are present. No enlarged abdominal or pelvic lymph nodes. A normal sized calcified lymph node anterior to the caudate lobe of the liver is unchanged. Reproductive: Moderately large bilateral hydroceles with moderate anterior scrotal skin thickening with 2 coarse calcifications in the left scrotum, without significant change. Prostate is unremarkable. Other: No abdominal wall hernia or abnormality. Musculoskeletal: Mild lumbar spine degenerative changes. These include moderate facet degenerative changes at the L4-5 grade 1 anterolisthesis. IMPRESSION: 1. Stable mild diffuse peripancreatic edema and soft tissue stranding, compatible with acute pancreatitis. 2. Stable diffuse hepatic steatosis. 3. Moderately large bilateral hydroceles with moderate anterior scrotal skin thickening  with 2 scrotal pearls on the left, without significant change. Electronically Signed   By: Beckie Salts M.D.   On: 03/09/2020 12:34    ____________________________________________   PROCEDURES  Procedure(s) performed (including Critical Care):  Procedures   ____________________________________________   INITIAL IMPRESSION / ASSESSMENT AND PLAN / ED COURSE   Bruce [name] was evaluated in Emergency Department on 03/09/2020 for the symptoms described in the history of present illness. He was evaluated in the context of the global COVID-19 pandemic, which necessitated consideration that the patient might be at risk for infection with the SARS-CoV-2 virus that causes COVID-19. Institutional protocols and algorithms that pertain to the evaluation of patients at risk for COVID-19 are in a state of rapid change based on information  released by regulatory bodies including the CDC and federal and state organizations. These policies and algorithms were followed during the patient's care in the ED.    Most Likely DDx:  -MSK (atypical chest pain) but will get cardiac markers to evaluate for ACS given risk factors/age -Patient abdominal pain is most likely from his pancreatitis given history of this.  I have low suspicion for dissection at this time.  Patient appears very tremulous and tachycardic and I suspect this is from withdrawal.  We will give a dose of Ativan before giving any pain medicine.  DDx that was also considered d/t potential to cause harm, but was found less likely based on history and physical (as detailed above): -PNA (no fevers, cough but CXR to evaluate) -PNX (reassured with equal b/l breath sounds, CXR to evaluate) -Symptomatic anemia (will get H&H) -Pulmonary embolism as no sob at rest, not pleuritic in nature, no hypoxia -Aortic Dissection as no tearing pain and no radiation to the mid back, pulses equal -Pericarditis no rub on exam, EKG changes or hx to suggest dx -Tamponade  (no notable SOB, tachycardic, hypotensive) -Esophageal rupture (no h/o diffuse vomitting/no crepitus)   11:28 AM patient's trembling and heart rate have come down.  Patient still having pain though.  Will give 1 dose of Dilaudid.  Awaiting for CT scan  CT scan concerning for pancreatitis.  Patient has got his bilateral hydroceles.  Discussed with patient he said no pain in his testicles.  Initial troponin is negative but will get repeat.  Lipase is significantly elevated at 589.  Patient's LFTs are also elevated most likely secondary to his alcohol use.  UA without evidence of UTI.  Discussed with patient his pain is likely better with Dilaudid.  Patient requesting admission for his pancreatitis.  Will discuss with hospital team for admission.  Will place patient on CIWA       ____________________________________________   FINAL CLINICAL IMPRESSION(S) / ED DIAGNOSES   Final diagnoses:  Alcohol-induced acute pancreatitis, unspecified complication status     MEDICATIONS GIVEN DURING THIS VISIT:  Medications  LORazepam (ATIVAN) injection 0-4 mg (has no administration in time range)    Or  LORazepam (ATIVAN) tablet 0-4 mg (has no administration in time range)  LORazepam (ATIVAN) injection 0-4 mg (has no administration in time range)    Or  LORazepam (ATIVAN) tablet 0-4 mg (has no administration in time range)  thiamine tablet 100 mg (has no administration in time range)    Or  thiamine (B-1) injection 100 mg (has no administration in time range)  sodium chloride flush (NS) 0.9 % injection 3 mL (3 mLs Intravenous Given 03/09/20 0951)  LORazepam (ATIVAN) injection 1 mg (1 mg Intravenous Given 03/09/20 1006)  sodium chloride 0.9 % bolus 1,000 mL (0 mLs Intravenous Stopped 03/09/20 1128)  acetaminophen (TYLENOL) tablet 1,000 mg (1,000 mg Oral Given 03/09/20 1005)  ondansetron (ZOFRAN) injection 4 mg (4 mg Intravenous Given 03/09/20 1005)  HYDROmorphone (DILAUDID) injection 1 mg (1 mg  Intravenous Given 03/09/20 1135)  iohexol (OMNIPAQUE) 300 MG/ML solution 100 mL (100 mLs Intravenous Contrast Given 03/09/20 1216)  HYDROmorphone (DILAUDID) injection 1 mg (1 mg Intravenous Given 03/09/20 1418)  sodium chloride 0.9 % bolus 1,000 mL (1,000 mLs Intravenous New Bag/Given 03/09/20 1418)     ED Discharge Orders    None       Note:  This document was prepared using Dragon voice recognition software and may include unintentional dictation errors.   Concha Se,  MD 03/09/20 1506

## 2020-03-09 NOTE — H&P (Addendum)
History and Physical:    Bruce [name]   ZOX:[id number] DOB: 1971/07/13 DOA: 03/09/2020  Referring MD/provider: Dr. Fuller Plan PCP: Patient, No Pcp Per   Patient coming from: Home  Chief Complaint: Abdominal pain  History of Present Illness:   Bruce [name] is an 49 y.o. male with past medical history significant for pancreatitis secondary to alcohol use who was discharged 2 months ago, history of paroxysmal atrial fibrillation with chads vas 2 score of 0 and hepatitis C was in his usual state of health until last night at 1030 when he developed acute abdominal pain nausea and vomiting.  Patient states that this feels exactly like his pancreatitis that he had had before.  Patient is puzzled because he is cut down on his alcohol.  Notes that he had been "only drinking beer".  He also notes that he has been taking a vitamin to protect against pancreatitis.  He feels the [facility] he had had the hour before he developed abdominal pain but caused his pancreatitis.  Patient notes that he was vomiting dark liquid even though he did not eat anything after dinner.  Patient is not sure if there was blood in the vomitus, he did not think so but noted it was dark.  Patient admits to feeling dizzy.  Notes he thinks he passed out last night but is not sure.  Patient states that he found himself on the floor and called his wife.  Does not think he hurt his head.   Review of systems is notable for abdominal pain when he tries to urinate.  Denies dysuria urgency per se.  But notes that when he urinates his abdominal pain gets worse.  Patient also notes that his abdominal pain worsened on the way here going over bumps in the car.  Patient admits that he is not hungry right now but is hoping he can eat tomorrow morning.  Patient denies fevers or chills.  No shortness of breath or chest pain.  No diarrhea.  Patient denies history of complicated withdrawal in the past.  ED Course:  The patient was noted to have a  soft abdomen with diffuse tenderness.  Lipase was 580.  Abdominal CT was done which was consistent with acute pancreatitis but did not show any complications.  Patient was also noted to be somewhat tremulous.  He was treated with Ativan, IV fluid resuscitation and hydromorphone.  ROS:    ROS   Review of Systems: As per HPI  Past Medical History:   Past Medical History:  Diagnosis Date  . A-fib (HCC)   . Dental caries   . Hepatitis C infection   . Iritis   . PAF (paroxysmal atrial fibrillation) (HCC)    a. diagnosed 03/19/18; b. CHADS2VASc 0  . Polysubstance abuse (HCC)    a. cocaine, tobacco, etoh  . Scrotal mass   . Tick borne fever   . Uveitis     Past Surgical History:   Past Surgical History:  Procedure Laterality Date  . FINGER SURGERY     infection, middle left  . fractured jaw    . SHOULDER SURGERY     right    Social History:   Social History   Socioeconomic History  . Marital status: Single    Spouse name: Not on file  . Number of children: 0  . Years of education: Not on file  . Highest education level: Not on file  Occupational History    Employer: UNEMPLOYE  Tobacco  Use  . Smoking status: Current Some Day Smoker    Packs/day: 0.25    Types: Cigarettes  . Smokeless tobacco: Never Used  . Tobacco comment: Per pt  " I only smoke cigarettes when I drink beer"  Substance and Sexual Activity  . Alcohol use: Yes    Comment: 6 pack per week  . Drug use: Yes    Types: IV, Cocaine    Comment: Pt states that he last used "a while back"  . Sexual activity: Not on file  Other Topics Concern  . Not on file  Social History Narrative  . Not on file   Social Determinants of Health   Financial Resource Strain:   . Difficulty of Paying Living Expenses:   Food Insecurity:   . Worried About Programme researcher, broadcasting/film/video in the Last Year:   . Barista in the Last Year:   Transportation Needs:   . Freight forwarder (Medical):   Marland Kitchen Lack of  Transportation (Non-Medical):   Physical Activity:   . Days of Exercise per Week:   . Minutes of Exercise per Session:   Stress:   . Feeling of Stress :   Social Connections:   . Frequency of Communication with Friends and Family:   . Frequency of Social Gatherings with Friends and Family:   . Attends Religious Services:   . Active Member of Clubs or Organizations:   . Attends Banker Meetings:   Marland Kitchen Marital Status:   Intimate Partner Violence:   . Fear of Current or Ex-Partner:   . Emotionally Abused:   Marland Kitchen Physically Abused:   . Sexually Abused:     Allergies   Penicillins  Family history:   Family History  Problem Relation Age of Onset  . Drug abuse Father     Current Medications:   Prior to Admission medications   Medication Sig Start Date End Date Taking? Authorizing Provider  acetaminophen (TYLENOL) 500 MG tablet Take 500-1,000 mg by mouth every 6 (six) hours as needed for mild pain or fever.    [provider]  folic acid (FOLVITE) 1 MG tablet Take 1 tablet (1 mg total) by mouth daily. Patient not taking: Reported on 03/09/2020 01/15/20   Alford Highland, MD  ondansetron (ZOFRAN) 4 MG tablet Take 1 tablet (4 mg total) by mouth every 6 (six) hours as needed for nausea. Patient not taking: Reported on 03/09/2020 01/14/20   Alford Highland, MD  oxyCODONE (OXY IR/ROXICODONE) 5 MG immediate release tablet Take 1 tablet (5 mg total) by mouth every 6 (six) hours as needed for up to 10 doses for severe pain. Patient not taking: Reported on 03/09/2020 01/14/20   Alford Highland, MD  thiamine 100 MG tablet Take 1 tablet (100 mg total) by mouth daily. Patient not taking: Reported on 03/09/2020 01/14/20   Alford Highland, MD    Physical Exam:   Vitals:   03/09/20 1200 03/09/20 1330 03/09/20 1530 03/09/20 1531  BP: (!) 139/93 (!) 143/92 (!) 159/91 (!) 159/91  Pulse: 84 77 95 60  Resp: 18 19 17    Temp:      TempSrc:      SpO2: 100% 99% 98%   Weight:        Height:         Physical Exam: Blood pressure (!) 159/91, pulse 60, temperature 97.6 F (36.4 C), temperature source Oral, resp. rate 17, height 5\' 7"  (1.702 m), weight 74.8 kg, SpO2 98 %. Gen:  Thin man lying in bed appearing anxious with wife at bedside watching TV. Eyes: sclera anicteric, conjuctiva mildly injected bilaterally CVS: S1-S2, regulary, no gallops Respiratory:  decreased air entry likely secondary to decreased inspiratory effort GI: Patient does have bowel sounds.  They are normoactive.  His abdomen is soft.  He does have diffuse tenderness to light palpation throughout.  He does have rebound tenderness throughout. LE: No edema. No cyanosis Neuro: A/O x 3, Moving all extremities equally with normal strength, CN 3-12 intact, grossly nonfocal.  Psych: patient's judgement and insight appear poor, mood and affect are anxious.    Data Review:    Labs: Basic Metabolic Panel: Recent Labs  Lab 03/09/20 0954  NA 141  K 3.8  CL 105  CO2 21*  GLUCOSE 106*  BUN 8  CREATININE 0.70  CALCIUM 9.5   Liver Function Tests: Recent Labs  Lab 03/09/20 0954  AST 129*  ALT 76*  ALKPHOS 104  BILITOT 2.1*  PROT 8.7*  ALBUMIN 4.3   Recent Labs  Lab 03/09/20 0954  LIPASE 589*   No results for input(s): AMMONIA in the last 168 hours. CBC: Recent Labs  Lab 03/09/20 0954  WBC 4.7  HGB 16.8  HCT 47.3  MCV 89.9  PLT 172   Cardiac Enzymes: No results for input(s): CKTOTAL, CKMB, CKMBINDEX, TROPONINI in the last 168 hours.  BNP (last 3 results) No results for input(s): PROBNP in the last 8760 hours. CBG: No results for input(s): GLUCAP in the last 168 hours.  Urinalysis    Component Value Date/Time   COLORURINE YELLOW 03/09/2020 0954   APPEARANCEUR CLEAR 03/09/2020 0954   LABSPEC 1.020 03/09/2020 0954   PHURINE 6.0 03/09/2020 0954   GLUCOSEU NEGATIVE 03/09/2020 0954   HGBUR NEGATIVE 03/09/2020 0954   BILIRUBINUR SMALL (A) 03/09/2020 0954   KETONESUR 40 (A)  03/09/2020 0954   PROTEINUR 30 (A) 03/09/2020 0954   UROBILINOGEN 1.0 05/01/2012 0835   NITRITE NEGATIVE 03/09/2020 0954   LEUKOCYTESUR NEGATIVE 03/09/2020 0954      Radiographic Studies: DG Chest 2 View  Result Date: 03/09/2020 CLINICAL DATA:  Chest pain. Additional history provided: Left-sided chest pain and lower abdominal pain since yesterday. EXAM: CHEST - 2 VIEW COMPARISON:  Chest radiograph 03/13/2020 FINDINGS: Heart size within normal limits. There is no appreciable airspace consolidation. No evidence of pleural effusion or pneumothorax. No acute bony abnormality identified. IMPRESSION: No evidence of acute cardiopulmonary abnormality. Electronically Signed   By: Jackey Loge DO   On: 03/09/2020 09:56   CT ABDOMEN PELVIS W CONTRAST  Result Date: 03/09/2020 CLINICAL DATA:  Abdominal pain, nausea and distension since yesterday. History of hepatitis-C. EXAM: CT ABDOMEN AND PELVIS WITH CONTRAST TECHNIQUE: Multidetector CT imaging of the abdomen and pelvis was performed using the standard protocol following bolus administration of intravenous contrast. CONTRAST:  OMNIPAQUE IOHEXOL 300 MG/ML  SOLN COMPARISON:  01/12/2020 FINDINGS: Lower chest: Unremarkable. Hepatobiliary: Stable diffuse low density of the liver relative to the spleen. Normal appearing gallbladder. Pancreas: Mild diffuse peripancreatic edema and soft tissue stranding without significant change. No interval fluid collections. Spleen: Normal in size without focal abnormality. Adrenals/Urinary Tract: Adrenal glands are unremarkable. Kidneys are normal, without renal calculi, focal lesion, or hydronephrosis. Bladder is unremarkable. Stomach/Bowel: Stomach is within normal limits. Appendix appears normal. No evidence of bowel wall thickening, distention, or inflammatory changes. Vascular/Lymphatic: No significant vascular findings are present. No enlarged abdominal or pelvic lymph nodes. A normal sized calcified lymph node anterior  to the  caudate lobe of the liver is unchanged. Reproductive: Moderately large bilateral hydroceles with moderate anterior scrotal skin thickening with 2 coarse calcifications in the left scrotum, without significant change. Prostate is unremarkable. Other: No abdominal wall hernia or abnormality. Musculoskeletal: Mild lumbar spine degenerative changes. These include moderate facet degenerative changes at the L4-5 grade 1 anterolisthesis. IMPRESSION: 1. Stable mild diffuse peripancreatic edema and soft tissue stranding, compatible with acute pancreatitis. 2. Stable diffuse hepatic steatosis. 3. Moderately large bilateral hydroceles with moderate anterior scrotal skin thickening with 2 scrotal pearls on the left, without significant change. Electronically Signed   By: Beckie Salts M.D.   On: 03/09/2020 12:34    EKG: Independently reviewed.  Sinus tach at 110.  Axis at 90.  P mitrale.  P pulmonale.  Unspecific ST-T wave changes.   Assessment/Plan:   Principal Problem:   Pancreatitis Active Problems:   Hepatitis C infection   AF (paroxysmal atrial fibrillation) (HCC)   Polysubstance abuse (HCC)  Recurrent episode of acute pancreatitis likely secondary to alcohol We will treat patient with conservative management CT without evidence of complication N.p.o., IV fluids Dilaudid for pain control, Zofran as needed for nausea  Alcohol abuse  Patient denies history of complicated withdrawal although he was noted to be tremulous and anxious in ED upon first arrival.  He was treated with Ativan with good effect. We will continue patient on CIWA protocol.  Paroxysmal atrial fibrillation Presently in sinus rhythm and rate controlled. Continue patient on telemetry. Per previous notes, patient was not considered a good candidate for anticoagulation.  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     Other information:   DVT  prophylaxis: SCD ordered. Code Status: Full Family Communication: Wife was at bedside throughout Disposition Plan: Home Consults called: None Admission status: Inpatient  Bryssa Tones Tublu Nikkia Devoss [facility]  If 7PM-7AM, please contact night-coverage [email or web address] Password [email or web address] 03/09/2020, 4:10 PM

## 2020-03-09 NOTE — Progress Notes (Addendum)
Alerted Dr. Luberta Robertson that patient reports some itching after diladud. Patient states he still wants the medication, but he is slightly itchy. MD aware, placed order for lotion. Patients throat is not swollen, breathing is even and unlabored. Will continue to monitor. BP is better after pain control, but still slightly elevated. MD notified of patient's slightly elevated bp. MD acknowledged, no new orders. All safety measures are in place.

## 2020-03-09 NOTE — ED Triage Notes (Signed)
Pt to ED via POV c/o left sided chest pain and lower abdominal pain since yesterday. Pt is dry heaving and diaphoretic in triage. Pt appears moderately uncomfortable at this time. Pt states that he was vomiting large amounts yesterday. States that emesis was brown. Pt has not been able to eat since yesterday.

## 2020-03-09 NOTE — ED Notes (Signed)
Pt transported to room 107 by EDT Britt at this time.

## 2020-03-09 NOTE — ED Notes (Signed)
Attempted to call floor for pt's room assignment. Per Secretary Ginger RN giving bath at this time and is unavailable.  This RN's ascom number given and will wait for return call.

## 2020-03-09 NOTE — ED Notes (Signed)
Pt placed in bathroom in triage per request.

## 2020-03-09 NOTE — Progress Notes (Signed)
Patient arrived on floor from ED. Breathing is even and unlabored. No distress is noted. All safety measures are in place. Patient c/o nausea and pain- medications given as ordered. BP is elevated, most likely due to pain, will recheck after pain medication kicks in. Will continue to monitor.

## 2020-03-09 NOTE — ED Notes (Signed)
Pt requesting pain medication. No orders at this time. Admit MD actively putting in orders. Pt informed and updated.

## 2020-03-10 LAB — COMPREHENSIVE METABOLIC PANEL
ALT: 58 U/L — ABNORMAL HIGH (ref 0–44)
AST: 82 U/L — ABNORMAL HIGH (ref 15–41)
Albumin: 3.9 g/dL (ref 3.5–5.0)
Alkaline Phosphatase: 87 U/L (ref 38–126)
Anion gap: 7 (ref 5–15)
BUN: 10 mg/dL (ref 6–20)
CO2: 26 mmol/L (ref 22–32)
Calcium: 8.9 mg/dL (ref 8.9–10.3)
Chloride: 109 mmol/L (ref 98–111)
Creatinine, Ser: 0.72 mg/dL (ref 0.61–1.24)
GFR calc Af Amer: >60 mL/min (ref >60)
GFR calc non Af Amer: >60 mL/min (ref >60)
Glucose, Bld: 88 mg/dL (ref 70–99)
Potassium: 3.9 mmol/L (ref 3.5–5.1)
Sodium: 142 mmol/L (ref 135–145)
Total Bilirubin: 1.5 mg/dL — ABNORMAL HIGH (ref 0.3–1.2)
Total Protein: 7.9 g/dL (ref 6.5–8.1)

## 2020-03-10 LAB — CBC
HCT: 41.3 % (ref 39.0–52.0)
Hemoglobin: 14.6 g/dL (ref 13.0–17.0)
MCH: 31.7 pg (ref 26.0–34.0)
MCHC: 35.4 g/dL (ref 30.0–36.0)
MCV: 89.8 fL (ref 80.0–100.0)
Platelets: 144 10*3/uL — ABNORMAL LOW (ref 150–400)
RBC: 4.6 MIL/uL (ref 4.22–5.81)
RDW: 12.2 % (ref 11.5–15.5)
WBC: 5.7 10*3/uL (ref 4.0–10.5)
nRBC: 0 % (ref 0.0–0.2)

## 2020-03-10 LAB — LIPASE, BLOOD: Lipase: 258 U/L — ABNORMAL HIGH (ref 11–51)

## 2020-03-10 MED ORDER — ENOXAPARIN SODIUM 40 MG/0.4ML ~~LOC~~ SOLN
40.0000 mg | SUBCUTANEOUS | Status: DC
  Administered 2020-03-10 – 2020-03-14 (×5): 40 mg via SUBCUTANEOUS
  Filled 2020-03-10 (×5): qty 0.4

## 2020-03-10 MED ORDER — MORPHINE SULFATE (PF) 2 MG/ML IV SOLN
2.0000 mg | INTRAVENOUS | Status: DC | PRN
  Administered 2020-03-10: 09:00:00 2 mg via INTRAVENOUS
  Filled 2020-03-10: qty 1

## 2020-03-10 MED ORDER — HYDROMORPHONE HCL 1 MG/ML IJ SOLN
0.5000 mg | INTRAMUSCULAR | Status: DC | PRN
  Administered 2020-03-10 – 2020-03-14 (×22): 0.5 mg via INTRAVENOUS
  Filled 2020-03-10 (×22): qty 1

## 2020-03-10 NOTE — Progress Notes (Signed)
PROGRESS NOTE    Bruce [name]  UTM:[id number]  DOB: 21-Oct-1971  DOA: 03/09/2020 PCP: Patient, No Pcp Per Outpatient Specialists:   Hospital course:  49 year old man was admitted yesterday with acute alcoholic pancreatitis.  CT without any evidence of fluid collection.   Subjective:  Patient states he feels a little bit better with decreased abdominal pain.  However he does have ongoing nausea with minimal vomiting.  Is requesting ice chips.  Also requesting to have Dilaudid instead of IV morphine.  Notes the Dilaudid last about 30 minutes longer.  When I discussed his complaints of itching patient states "I have jock itch, I did not want to tell the nurse that.  I do not really need Benadryl.  I just do better with the Dilaudid".   Objective: Vitals:   03/09/20 1812 03/09/20 2353 03/10/20 0753 03/10/20 0800  BP: (!) 158/89 (!) 148/102 (!) 153/105   Pulse:  88 65 72  Resp:  15 17   Temp:  97.9 F (36.6 C) 98.5 F (36.9 C) 98.5 F (36.9 C)  TempSrc:  Oral Oral Oral  SpO2:  100% 97%   Weight:      Height:        Intake/Output Summary (Last 24 hours) at 03/10/2020 1321 Last data filed at 03/10/2020 0100 Gross per 24 hour  Intake 100 ml  Output 500 ml  Net -400 ml   Filed Weights   03/09/20 0914  Weight: 74.8 kg     Exam:  General: Patient lying comfortably in bed no acute distress. Eyes: sclera anicteric, conjuctiva mild injection bilaterally CVS: S1-S2, regular  Respiratory:  decreased air entry bilaterally secondary to decreased inspiratory effort, rales at bases  GI: Abdomen has bowel sounds that are normoactive.  It is soft.  Decreased tenderness from yesterday.  No rebound tenderness.   LE: No edema.  Neuro: A/O x 3, Moving all extremities equally with normal strength, CN 3-12 intact, grossly nonfocal.  Psych: patient is logical and coherent, judgement and insight appear normal, mood and affect appropriate to situation.   Assessment & Plan:    49 year old admitted with acute alcoholic pancreatitis  Recurrent episode of acute pancreatitis likely secondary to alcohol Continue with conservative management Lipase decreased from 589 to 258 CT without evidence of complication  N.p.o., IV fluids, as needed Zofran Will place patient back on Dilaudid per his request however no IV Benadryl should be given.  If patient has recurrent itching on Dilaudid he needs to be switched back to morphine.  Alcohol abuse  Patient denies history of complicated withdrawal although he was noted to be tremulous and anxious in ED upon first arrival.  He was treated with Ativan with good effect. We will continue patient on CIWA protocol.  Paroxysmal atrial fibrillation Presently in sinus rhythm and rate controlled. Continue patient on telemetry. Per previous notes, patient was not considered a good candidate for anticoagulation.  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    DVT prophylaxis: Lovenox Code Status: Full Family Communication: Patient states he has no family to call Disposition Plan:   Patient is from: Home  Anticipated Discharge Location: Home  Barriers to Discharge: Acute pancreatitis  Is patient medically stable for Discharge: No   Consultants:  None  Procedures:  None  Antimicrobials:  None   Data Reviewed:  Basic Metabolic Panel: Recent Labs  Lab 03/09/20 0954 03/10/20 0440  NA 141 142  K  3.8 3.9  CL 105 109  CO2 21* 26  GLUCOSE 106* 88  BUN 8 10  CREATININE 0.70 0.72  CALCIUM 9.5 8.9   Liver Function Tests: Recent Labs  Lab 03/09/20 0954 03/10/20 0440  AST 129* 82*  ALT 76* 58*  ALKPHOS 104 87  BILITOT 2.1* 1.5*  PROT 8.7* 7.9  ALBUMIN 4.3 3.9   Recent Labs  Lab 03/09/20 0954 03/10/20 0440  LIPASE 589* 258*   No results for input(s): AMMONIA in the last 168 hours. CBC: Recent Labs  Lab 03/09/20 0954  03/10/20 0440  WBC 4.7 5.7  HGB 16.8 14.6  HCT 47.3 41.3  MCV 89.9 89.8  PLT 172 144*   Cardiac Enzymes: No results for input(s): CKTOTAL, CKMB, CKMBINDEX, TROPONINI in the last 168 hours. BNP (last 3 results) No results for input(s): PROBNP in the last 8760 hours. CBG: No results for input(s): GLUCAP in the last 168 hours.  Recent Results (from the past 240 hour(s))  Respiratory Panel by RT PCR (Flu A&B, Covid) - Nasopharyngeal Swab     Status: None   Collection Time: 03/09/20  2:13 PM   Specimen: Nasopharyngeal Swab  Result Value Ref Range Status   SARS Coronavirus 2 by RT PCR NEGATIVE NEGATIVE Final    Comment: (NOTE) SARS-CoV-2 target nucleic acids are NOT DETECTED. The SARS-CoV-2 RNA is generally detectable in upper respiratoy specimens during the acute phase of infection. The lowest concentration of SARS-CoV-2 viral copies this assay can detect is 131 copies/mL. A negative result does not preclude SARS-Cov-2 infection and should not be used as the sole basis for treatment or other patient management decisions. A negative result may occur with  improper specimen collection/handling, submission of specimen other than nasopharyngeal swab, presence of viral mutation(s) within the areas targeted by this assay, and inadequate number of viral copies (<131 copies/mL). A negative result must be combined with clinical observations, patient history, and epidemiological information. The expected result is Negative. Fact Sheet for Patients:  PinkCheek.be Fact Sheet for Healthcare Providers:  GravelBags.it This test is not yet ap proved or cleared by the [place] FDA and  has been authorized for detection and/or diagnosis of SARS-CoV-2 by FDA under an Emergency Use Authorization (EUA). This EUA will remain  in effect (meaning this test can be used) for the duration of the COVID-19 declaration under Section 564(b)(1) of  the Act, 21 U.S.C. section 360bbb-3(b)(1), unless the authorization is terminated or revoked sooner.    Influenza A by PCR NEGATIVE NEGATIVE Final   Influenza B by PCR NEGATIVE NEGATIVE Final    Comment: (NOTE) The Xpert Xpress SARS-CoV-2/FLU/RSV assay is intended as an aid in  the diagnosis of influenza from Nasopharyngeal swab specimens and  should not be used as a sole basis for treatment. Nasal washings and  aspirates are unacceptable for Xpert Xpress SARS-CoV-2/FLU/RSV  testing. Fact Sheet for Patients: PinkCheek.be Fact Sheet for Healthcare Providers: GravelBags.it This test is not yet approved or cleared by the [place] FDA and  has been authorized for detection and/or diagnosis of SARS-CoV-2 by  FDA under an Emergency Use Authorization (EUA). This EUA will remain  in effect (meaning this test can be used) for the duration of the  Covid-19 declaration under Section 564(b)(1) of the Act, 21  U.S.C. section 360bbb-3(b)(1), unless the authorization is  terminated or revoked. Performed at [facility], [address] [place], [place] [address]       Studies: DG Chest 2 View  Result Date: 03/09/2020 CLINICAL DATA:  Chest pain. Additional history provided: Left-sided chest pain and lower abdominal pain since yesterday. EXAM: CHEST - 2 VIEW COMPARISON:  Chest radiograph 03/13/2020 FINDINGS: Heart size within normal limits. There is no appreciable airspace consolidation. No evidence of pleural effusion or pneumothorax. No acute bony abnormality identified. IMPRESSION: No evidence of acute cardiopulmonary abnormality. Electronically Signed   By: Jackey Loge DO   On: 03/09/2020 09:56   CT ABDOMEN PELVIS W CONTRAST  Result Date: 03/09/2020 CLINICAL DATA:  Abdominal pain, nausea and distension since yesterday. History of hepatitis-C. EXAM: CT ABDOMEN AND PELVIS WITH CONTRAST TECHNIQUE: Multidetector CT imaging of  the abdomen and pelvis was performed using the standard protocol following bolus administration of intravenous contrast. CONTRAST:  OMNIPAQUE IOHEXOL 300 MG/ML  SOLN COMPARISON:  01/12/2020 FINDINGS: Lower chest: Unremarkable. Hepatobiliary: Stable diffuse low density of the liver relative to the spleen. Normal appearing gallbladder. Pancreas: Mild diffuse peripancreatic edema and soft tissue stranding without significant change. No interval fluid collections. Spleen: Normal in size without focal abnormality. Adrenals/Urinary Tract: Adrenal glands are unremarkable. Kidneys are normal, without renal calculi, focal lesion, or hydronephrosis. Bladder is unremarkable. Stomach/Bowel: Stomach is within normal limits. Appendix appears normal. No evidence of bowel wall thickening, distention, or inflammatory changes. Vascular/Lymphatic: No significant vascular findings are present. No enlarged abdominal or pelvic lymph nodes. A normal sized calcified lymph node anterior to the caudate lobe of the liver is unchanged. Reproductive: Moderately large bilateral hydroceles with moderate anterior scrotal skin thickening with 2 coarse calcifications in the left scrotum, without significant change. Prostate is unremarkable. Other: No abdominal wall hernia or abnormality. Musculoskeletal: Mild lumbar spine degenerative changes. These include moderate facet degenerative changes at the L4-5 grade 1 anterolisthesis. IMPRESSION: 1. Stable mild diffuse peripancreatic edema and soft tissue stranding, compatible with acute pancreatitis. 2. Stable diffuse hepatic steatosis. 3. Moderately large bilateral hydroceles with moderate anterior scrotal skin thickening with 2 scrotal pearls on the left, without significant change. Electronically Signed   By: Beckie Salts M.D.   On: 03/09/2020 12:34     Scheduled Meds: . diphenhydrAMINE  12.5 mg Intravenous Once  . LORazepam  0-4 mg Intravenous Q6H   Or  . LORazepam  0-4 mg Oral Q6H  .  [START ON 03/11/2020] LORazepam  0-4 mg Intravenous Q12H   Or  . [START ON 03/11/2020] LORazepam  0-4 mg Oral Q12H  . thiamine  100 mg Oral Daily   Or  . thiamine  100 mg Intravenous Daily   Continuous Infusions: . 0.9 % NaCl with KCl 20 mEq / L 100 mL/hr at 03/09/20 1810    Principal Problem:   Pancreatitis Active Problems:   Hepatitis C infection   AF (paroxysmal atrial fibrillation) (HCC)   Polysubstance abuse (HCC)     Srobona Orma Flaming, [facility]  If 7PM-7AM, please contact night-coverage [email or web address] Password [email or web address] 03/10/2020, 1:21 PM    LOS: 1 day

## 2020-03-10 NOTE — Progress Notes (Signed)
Alerted Dr. Luberta Robertson that patient is still complaining of itchiness with diladud. MD switched to morphine, will administer and watch patient closely for s/s of reaction. Also alerted MD that patients BP is 153/105. MD states she is fine with this, no new orders. Will continue to monitor.

## 2020-03-10 NOTE — Progress Notes (Signed)
Assumed care of patient at this time

## 2020-03-11 DIAGNOSIS — F1910 Other psychoactive substance abuse, uncomplicated: Secondary | ICD-10-CM

## 2020-03-11 DIAGNOSIS — I480 Paroxysmal atrial fibrillation: Secondary | ICD-10-CM

## 2020-03-11 LAB — CBC
HCT: 39.5 % (ref 39.0–52.0)
Hemoglobin: 13.7 g/dL (ref 13.0–17.0)
MCH: 31.9 pg (ref 26.0–34.0)
MCHC: 34.7 g/dL (ref 30.0–36.0)
MCV: 92.1 fL (ref 80.0–100.0)
Platelets: 135 10*3/uL — ABNORMAL LOW (ref 150–400)
RBC: 4.29 MIL/uL (ref 4.22–5.81)
RDW: 12 % (ref 11.5–15.5)
WBC: 5.7 10*3/uL (ref 4.0–10.5)
nRBC: 0 % (ref 0.0–0.2)

## 2020-03-11 LAB — COMPREHENSIVE METABOLIC PANEL
ALT: 46 U/L — ABNORMAL HIGH (ref 0–44)
AST: 60 U/L — ABNORMAL HIGH (ref 15–41)
Albumin: 3.6 g/dL (ref 3.5–5.0)
Alkaline Phosphatase: 74 U/L (ref 38–126)
Anion gap: 8 (ref 5–15)
BUN: 8 mg/dL (ref 6–20)
CO2: 26 mmol/L (ref 22–32)
Calcium: 8.8 mg/dL — ABNORMAL LOW (ref 8.9–10.3)
Chloride: 103 mmol/L (ref 98–111)
Creatinine, Ser: 0.7 mg/dL (ref 0.61–1.24)
GFR calc Af Amer: >60 mL/min (ref >60)
GFR calc non Af Amer: >60 mL/min (ref >60)
Glucose, Bld: 81 mg/dL (ref 70–99)
Potassium: 3.6 mmol/L (ref 3.5–5.1)
Sodium: 137 mmol/L (ref 135–145)
Total Bilirubin: 1.3 mg/dL — ABNORMAL HIGH (ref 0.3–1.2)
Total Protein: 7.4 g/dL (ref 6.5–8.1)

## 2020-03-11 LAB — LIPASE, BLOOD: Lipase: 62 U/L — ABNORMAL HIGH (ref 11–51)

## 2020-03-11 MED ORDER — BISACODYL 10 MG RE SUPP: 10.0000 mg | Freq: Every day | RECTAL | Status: DC | PRN

## 2020-03-11 MED ORDER — MAGNESIUM HYDROXIDE 400 MG/5ML PO SUSP
30.0000 mL | Freq: Every evening | ORAL | Status: DC | PRN
  Filled 2020-03-11: qty 30

## 2020-03-11 MED ORDER — AMLODIPINE BESYLATE 5 MG PO TABS
5.0000 mg | ORAL_TABLET | Freq: Every day | ORAL | Status: DC
  Administered 2020-03-11 – 2020-03-13 (×3): 5 mg via ORAL
  Filled 2020-03-11 (×3): qty 1

## 2020-03-11 MED ORDER — TAMSULOSIN HCL 0.4 MG PO CAPS
0.4000 mg | ORAL_CAPSULE | Freq: Every day | ORAL | Status: DC
  Administered 2020-03-11 – 2020-03-15 (×5): 0.4 mg via ORAL
  Filled 2020-03-11 (×5): qty 1

## 2020-03-11 NOTE — Progress Notes (Signed)
PROGRESS NOTE    Bruce [name]   DGL:[id number]  DOB: [birth year]  PCP: Patient, No Pcp Per    DOA: 03/09/2020 LOS: 2   Brief Narrative   Bruce [name] is an 49 y.o. male with past medical history significant for pancreatitis secondary to alcohol use (admission just 2 months ago), history of paroxysmal atrial fibrillation and hepatitis C.  He was admitted on 03/09/20 with abdominal pain, nausea and vomiting secondary to acute alcoholic pancreatitis.  CT abdomen without any evidence of fluid collection.     Assessment & Plan   Principal Problem:   Pancreatitis Active Problems:   Hepatitis C infection   AF (paroxysmal atrial fibrillation) (HCC)   Polysubstance abuse (HCC)    Acute pancreatitis, likely secondary to alcohol, recurrent --continue supportive care --clear liquid diet as tolerated --continue IV hydration --continue Dilaudid PRN pain control, Zofran PRN nausea  BPH - patient complains of lower urinary tract symptoms consistent with prostate enlargement with weak, intermittent stream, frequency, nocturia. --started Flomax --monitor for improvement  Alcohol abuse - no history of complicated withdrawal, but was tremulous and anxious in ED upon first arrival, treated with Ativan with good effect. --continue CIWA protocol with PRN Ativan per score  Hypertension - possibly due to pain, no known prior diagnosis.  BP's have been elevated. --start amlodipine 5 mg daily --monitor for improvement as pain resolves, may not need medication on d/c  Paroxysmal atrial fibrillation - rate controlled and in NSR on admission. --telemetry monitoring --Per previous notes, patient was not considered a good candidate for anticoagulation  Possible history of HIT Will avoid heparin as patient apparently may have had thrombocytopenia to Lovenox in the past  Patient BMI: Body mass index is 25.84 kg/m.   DVT prophylaxis: SCD's  Diet:  Diet Orders (From admission, onward)    Start      Ordered   03/11/20 0744  Diet clear liquid Room service appropriate? Yes; Fluid consistency: Thin  Diet effective now    Question Answer Comment  Room service appropriate? Yes   Fluid consistency: Thin      03/11/20 0743            Code Status: Full Code     Subjective 03/11/20    Patient seen with wife at bedside this morning.  No acute events reported.  States he continues to have intermittent severe episodes of abdominal pain.  He did try some liquid of breakfast but this increases pain and nausea.  Lower urinary tract symptoms including urinary frequency and nocturia with a weak, intermittent stream.  Denies history of prostate issues.  No fevers chills or other acute complaints.   Disposition Plan & Communication   Status is: Inpatient  Remains inpatient appropriate because:Ongoing active pain requiring inpatient pain management and IV treatments appropriate due to intensity of illness or inability to take PO   Dispo: The patient is from: Home              Anticipated d/c is to: Home              Anticipated d/c date is: 2 days              Patient currently is not medically stable to d/c.  Family Communication: Wife at bedside during encounter, updated and all questions answered   Consults, Procedures, Significant Events   Consultants:   None  Procedures:   None  Antimicrobials:   None    Objective   Vitals:  03/10/20 0800 03/10/20 1700 03/10/20 2358 03/11/20 0804  BP:  (!) 143/95 (!) 152/91 (!) 141/89  Pulse: 72 64 64 83  Resp:  17 17 19   Temp: 98.5 F (36.9 C) 98.6 F (37 C) 98.9 F (37.2 C) 98.7 F (37.1 C)  TempSrc: Oral Oral Oral Oral  SpO2:  99% 100% 100%  Weight:      Height:        Intake/Output Summary (Last 24 hours) at 03/11/2020 1327 Last data filed at 03/11/2020 1008 Gross per 24 hour  Intake 0 ml  Output 700 ml  Net -700 ml   Filed Weights   03/09/20 0914  Weight: 74.8 kg    Physical Exam:  General exam: awake,  alert, no acute distress HEENT: moist mucus membranes, hearing grossly normal  Respiratory system: CTAB, no wheezes, rales or rhonchi, normal respiratory effort. Cardiovascular system: normal S1/S2, RRR, no pedal edema.   Gastrointestinal system: soft, tender on palpation with voluntary guarding, no rebound tenderness, +bowel sounds. Central nervous system: A&O x4. no gross focal neurologic deficits, normal speech Extremities: moves all, no edema, normal tone Psychiatry: normal mood, congruent affect, judgement and insight appear normal  Labs   Data Reviewed: I have personally reviewed following labs and imaging studies  CBC: Recent Labs  Lab 03/09/20 0954 03/10/20 0440 03/11/20 0439  WBC 4.7 5.7 5.7  HGB 16.8 14.6 13.7  HCT 47.3 41.3 39.5  MCV 89.9 89.8 92.1  PLT 172 144* 135*   Basic Metabolic Panel: Recent Labs  Lab 03/09/20 0954 03/10/20 0440 03/11/20 0439  NA 141 142 137  K 3.8 3.9 3.6  CL 105 109 103  CO2 21* 26 26  GLUCOSE 106* 88 81  BUN 8 10 8   CREATININE 0.70 0.72 0.70  CALCIUM 9.5 8.9 8.8*   GFR: Estimated Creatinine Clearance: 103.3 mL/min (by C-G formula based on SCr of 0.7 mg/dL). Liver Function Tests: Recent Labs  Lab 03/09/20 0954 03/10/20 0440 03/11/20 0439  AST 129* 82* 60*  ALT 76* 58* 46*  ALKPHOS 104 87 74  BILITOT 2.1* 1.5* 1.3*  PROT 8.7* 7.9 7.4  ALBUMIN 4.3 3.9 3.6   Recent Labs  Lab 03/09/20 0954 03/10/20 0440 03/11/20 0439  LIPASE 589* 258* 62*   No results for input(s): AMMONIA in the last 168 hours. Coagulation Profile: No results for input(s): INR, PROTIME in the last 168 hours. Cardiac Enzymes: No results for input(s): CKTOTAL, CKMB, CKMBINDEX, TROPONINI in the last 168 hours. BNP (last 3 results) No results for input(s): PROBNP in the last 8760 hours. HbA1C: No results for input(s): HGBA1C in the last 72 hours. CBG: No results for input(s): GLUCAP in the last 168 hours. Lipid Profile: No results for input(s):  CHOL, HDL, LDLCALC, TRIG, CHOLHDL, LDLDIRECT in the last 72 hours. Thyroid Function Tests: No results for input(s): TSH, T4TOTAL, FREET4, T3FREE, THYROIDAB in the last 72 hours. Anemia Panel: No results for input(s): VITAMINB12, FOLATE, FERRITIN, TIBC, IRON, RETICCTPCT in the last 72 hours. Sepsis Labs: No results for input(s): PROCALCITON, LATICACIDVEN in the last 168 hours.  Recent Results (from the past 240 hour(s))  Respiratory Panel by RT PCR (Flu A&B, Covid) - Nasopharyngeal Swab     Status: None   Collection Time: 03/09/20  2:13 PM   Specimen: Nasopharyngeal Swab  Result Value Ref Range Status   SARS Coronavirus 2 by RT PCR NEGATIVE NEGATIVE Final    Comment: (NOTE) SARS-CoV-2 target nucleic acids are NOT DETECTED. The SARS-CoV-2 RNA is generally  detectable in upper respiratoy specimens during the acute phase of infection. The lowest concentration of SARS-CoV-2 viral copies this assay can detect is 131 copies/mL. A negative result does not preclude SARS-Cov-2 infection and should not be used as the sole basis for treatment or other patient management decisions. A negative result may occur with  improper specimen collection/handling, submission of specimen other than nasopharyngeal swab, presence of viral mutation(s) within the areas targeted by this assay, and inadequate number of viral copies (<131 copies/mL). A negative result must be combined with clinical observations, patient history, and epidemiological information. The expected result is Negative. Fact Sheet for Patients:  PinkCheek.be Fact Sheet for Healthcare Providers:  GravelBags.it This test is not yet ap proved or cleared by the [place] FDA and  has been authorized for detection and/or diagnosis of SARS-CoV-2 by FDA under an Emergency Use Authorization (EUA). This EUA will remain  in effect (meaning this test can be used) for the duration of  the COVID-19 declaration under Section 564(b)(1) of the Act, 21 U.S.C. section 360bbb-3(b)(1), unless the authorization is terminated or revoked sooner.    Influenza A by PCR NEGATIVE NEGATIVE Final   Influenza B by PCR NEGATIVE NEGATIVE Final    Comment: (NOTE) The Xpert Xpress SARS-CoV-2/FLU/RSV assay is intended as an aid in  the diagnosis of influenza from Nasopharyngeal swab specimens and  should not be used as a sole basis for treatment. Nasal washings and  aspirates are unacceptable for Xpert Xpress SARS-CoV-2/FLU/RSV  testing. Fact Sheet for Patients: PinkCheek.be Fact Sheet for Healthcare Providers: GravelBags.it This test is not yet approved or cleared by the [place] FDA and  has been authorized for detection and/or diagnosis of SARS-CoV-2 by  FDA under an Emergency Use Authorization (EUA). This EUA will remain  in effect (meaning this test can be used) for the duration of the  Covid-19 declaration under Section 564(b)(1) of the Act, 21  U.S.C. section 360bbb-3(b)(1), unless the authorization is  terminated or revoked. Performed at [facility], [address], [place], [place] [address]       Imaging Studies   No results found.   Medications   Scheduled Meds: . amLODipine  5 mg Oral Daily  . diphenhydrAMINE  12.5 mg Intravenous Once  . enoxaparin (LOVENOX) injection  40 mg Subcutaneous Q24H  . LORazepam  0-4 mg Intravenous Q6H   Or  . LORazepam  0-4 mg Oral Q6H  . LORazepam  0-4 mg Intravenous Q12H   Or  . LORazepam  0-4 mg Oral Q12H  . tamsulosin  0.4 mg Oral QPC breakfast  . thiamine  100 mg Oral Daily   Or  . thiamine  100 mg Intravenous Daily   Continuous Infusions: . 0.9 % NaCl with KCl 20 mEq / L 100 mL/hr at 03/11/20 1309       LOS: 2 days    Time spent: 35 minutes    Ezekiel Slocumb, DO [facility]  03/11/2020, 1:27 PM    If 7PM-7AM, please contact  night-coverage. How to contact the [facility] Attending or Consulting provider [place] or covering provider during after hours [place], for this patient?    1. Check the care team in [facility] and look for a) attending/consulting TR[facility] provider listed and b) the [facility] team listed 2. Log into [email or web address] and use [place]'s universal password to access. If you do not have the password, please contact the hospital operator. 3. Locate the [facility] provider you are  looking for under [facility] and page to a number that you can be directly reached. 4. If you still have difficulty reaching the provider, please page the [facility] (Director on Call) for the Hospitalists listed on amion for assistance.

## 2020-03-11 NOTE — Hospital Course (Signed)
Bruce [name] is an 49 y.o. male with past medical history significant for pancreatitis secondary to alcohol use (admission just 2 months ago), history of paroxysmal atrial fibrillation and hepatitis C.  He was admitted on 03/09/20 with abdominal pain, nausea and vomiting secondary to acute alcoholic pancreatitis.  CT abdomen without any evidence of fluid collection.

## 2020-03-12 ENCOUNTER — Telehealth: Payer: Self-pay | Admitting: Pharmacy Technician

## 2020-03-12 MED ORDER — SODIUM CHLORIDE 0.9 % IV SOLN: INTRAVENOUS | Status: DC

## 2020-03-12 MED ORDER — POTASSIUM CHLORIDE 10 MEQ/100ML IV SOLN
10.0000 meq | INTRAVENOUS | Status: AC
  Administered 2020-03-12 (×3): 10 meq via INTRAVENOUS
  Filled 2020-03-12: qty 100

## 2020-03-12 NOTE — Telephone Encounter (Signed)
Provided patient with new patient packet to obtain ongoing [facility] services.  [facility] must receive requested financial documentation within 30 days in order to determine eligibility and provide additional medication assistance.  Bruce [name] Care Manager [facility] 

## 2020-03-12 NOTE — TOC Initial Note (Signed)
Transition of Care [facility]) - Initial/Assessment Note    Patient Details  Name: Bruce [name] MRN: [id number] Date of Birth: 25-Dec-1970  [facility] CM/SW Contact:    Shelbie Hutching, RN Phone Number: 03/12/2020, 11:44 AM  Clinical Narrative:                 Patient admitted to the hospital with acute pancreatitis.  RNCM introduced self and explained role.  Patient is from home where he lives with his fiance, fiance is currently at the bedside.  Patient does not currently have insurance or a primary care provider.  RNCM gave patient the application for [facility] and [facility], both are specifically for patient's without insurance.  Referral made to [facility].  Patient is independent in ADL's and has reliable transportation.  No other discharge needs identified at this time.   Expected Discharge Plan: Home/Self Care Barriers to Discharge: Continued Medical Work up   Patient Goals and CMS Choice Patient states their goals for this hospitalization and ongoing recovery are:: to get well      Expected Discharge Plan and Services Expected Discharge Plan: Home/Self Care   Discharge Planning Services: CM Consult   Living arrangements for the past 2 months: Single Family Home                                      Prior Living Arrangements/Services Living arrangements for the past 2 months: Single Family Home Lives with:: Significant Other Patient language and need for interpreter reviewed:: Yes Do you feel safe going back to the place where you live?: Yes      Need for Family Participation in Patient Care: Yes (Comment) Care giver support system in place?: Yes (comment)(fiance)   Criminal Activity/Legal Involvement Pertinent to Current Situation/Hospitalization: No - Comment as needed  Activities of Daily Living Home Assistive Devices/Equipment: None ADL Screening (condition at time of admission) Patient's cognitive ability adequate to  safely complete daily activities?: Yes Is the patient deaf or have difficulty hearing?: No Does the patient have difficulty seeing, even when wearing glasses/contacts?: No Does the patient have difficulty concentrating, remembering, or making decisions?: No Patient able to express need for assistance with ADLs?: Yes Does the patient have difficulty dressing or bathing?: No Independently performs ADLs?: Yes (appropriate for developmental age) Does the patient have difficulty walking or climbing stairs?: No Weakness of Legs: None Weakness of Arms/Hands: None  Permission Sought/Granted Permission sought to share information with : Case Manager, Family Supports Permission granted to share information with : Yes, Verbal Permission Granted        Permission granted to share info w Relationship: fiance     Emotional Assessment Appearance:: Appears stated age Attitude/Demeanor/Rapport: Engaged Affect (typically observed): Accepting Orientation: : Oriented to Self, Oriented to Place, Oriented to  Time, Oriented to Situation Alcohol / Substance Use: Alcohol Use Psych Involvement: No (comment)  Admission diagnosis:  Pancreatitis [K85.90] Alcohol-induced acute pancreatitis, unspecified complication status [D32.67] Pneumonia due to COVID-19 virus [U07.1, J12.82] Patient Active Problem List   Diagnosis Date Noted  . Polysubstance abuse ([place])   . Hypokalemia   . Alcohol abuse   . Acute pancreatitis 01/12/2020  . Thrombocytopenia ([place]) 06/25/2019  . Elevated troponin   . Pancreatitis 06/23/2019  . AF (paroxysmal atrial fibrillation) ([place]) 09/13/2018  . Atrial fibrillation ([place]) 03/19/2018  . Abnormality of gait 02/12/2013  . Mandibular  fracture (HCC) 12/25/2012  . Flu vaccine need 12/25/2012  . Scrotal mass 05/22/2012  . Dental caries 05/22/2012  . Hepatitis C infection 05/16/2012  . Myalgia 05/06/2012  . Arthralgia 05/06/2012  . Tick borne fever, likely 05/05/2012  . Tinea cruris,  likely 05/05/2012  . Spermatocele of epididymis 05/01/2012  . Bilateral varicoceles 05/01/2012  . Bilateral hydrocele 05/01/2012   PCP:  Patient, No Pcp Per Pharmacy:   [facility] [place], [place] - [address] [place] [address] Phone: [phone] Fax: [phone]  [facility] [address] ([address]), [place] - [address] [place]) [place] [address] Phone: [phone] Fax: [phone]     Social Determinants of Health (SDOH) Interventions    Readmission Risk Interventions No flowsheet data found.

## 2020-03-12 NOTE — Progress Notes (Signed)
PROGRESS NOTE    Deral Schellenberg   NUU:[id number]  DOB: 1971-02-07  PCP: Patient, No Pcp Per    DOA: 03/09/2020 LOS: 3   Brief Narrative   Trevious Rampey is an 49 y.o. male with past medical history significant for pancreatitis secondary to alcohol use (admission just 2 months ago), history of paroxysmal atrial fibrillation and hepatitis C.  He was admitted on 03/09/20 with abdominal pain, nausea and vomiting secondary to acute alcoholic pancreatitis.  CT abdomen without any evidence of fluid collection.     Assessment & Plan   Principal Problem:   Pancreatitis Active Problems:   Hepatitis C infection   AF (paroxysmal atrial fibrillation) (HCC)   Polysubstance abuse (HCC)    Acute pancreatitis, likely secondary to alcohol, recurrent --continue supportive care --clear liquid diet as tolerated --continue IV hydration --continue Dilaudid PRN pain control, Zofran PRN nausea  Hypokalemia - replacing by IV.  Monitor, replete as needed for goal K>4.0  BPH - patient complains of lower urinary tract symptoms consistent with prostate enlargement with weak, intermittent stream, frequency, nocturia. Improved with starting Flomax --continue Flomax --follow up with PCP or urology  Alcohol abuse - no history of complicated withdrawal, but was tremulous and anxious in ED upon first arrival, treated with Ativan with good effect. --continue CIWA protocol with PRN Ativan per score  Hypertension - possibly due to pain, no known prior diagnosis.  BP's have been elevated. --start amlodipine 5 mg daily --monitor for improvement as pain resolves, may not need medication on d/c  Paroxysmal atrial fibrillation - rate controlled and in NSR on admission. 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   DVT prophylaxis: SCD's  Diet:  Diet Orders (From admission, onward)    Start     Ordered   03/11/20 0744  Diet clear liquid Room service appropriate? Yes; Fluid consistency: Thin  Diet effective now    Question Answer Comment  Room service appropriate? Yes   Fluid consistency: Thin      03/11/20 0743            Code Status: Full Code     Subjective 03/12/20    Patient seen with wife at bedside this morning.  No acute events reported.  States Flomax helped a lot.  Tried some juice for breakfast but made pain and nausea worse.  Tolerates water.  No fevers chills or other acute complaints.   Disposition Plan & Communication   Status is: Inpatient  Remains inpatient appropriate because:Ongoing active pain requiring inpatient pain management and IV treatments appropriate due to intensity of illness or inability to take PO   Dispo: The patient is from: Home              Anticipated d/c is to: Home              Anticipated d/c date is: 2 days              Patient currently is not medically stable to d/c.  Family Communication: Wife at bedside during encounter, updated and all questions answered   Consults, Procedures, Significant Events   Consultants:   None  Procedures:   None  Antimicrobials:   None    Objective   Vitals:   03/11/20 0804 03/11/20 1700 03/11/20  2300 03/12/20 0854  BP: (!) 141/89 130/86 (!) 143/94 124/88  Pulse: 83 70 77 67  Resp: 19 13 15 16   Temp: 98.7 F (37.1 C) 98.5 F (36.9 C) 97.8 F (36.6 C) 97.6 F (36.4 C)  TempSrc: Oral Oral Oral Oral  SpO2: 100% 100% 99% 100%  Weight:      Height:        Intake/Output Summary (Last 24 hours) at 03/12/2020 0939 Last data filed at 03/11/2020 1845 Gross per 24 hour  Intake 0 ml  Output --  Net 0 ml   Filed Weights   03/09/20 0914  Weight: 74.8 kg    Physical Exam:  General exam: awake, alert, no acute distress Respiratory system: CTAB, no wheezes,  rales or rhonchi, normal respiratory effort. Cardiovascular system: normal S1/S2, RRR, no pedal edema.   Gastrointestinal system: soft, still tender on palpation but less guarding, no rebound tenderness, +bowel sounds. Central nervous system: A&O x4. no gross focal neurologic deficits, normal speech Extremities: moves all, no edema, normal tone Psychiatry: normal mood, congruent affect, judgement and insight appear normal  Labs   Data Reviewed: I have personally reviewed following labs and imaging studies  CBC: Recent Labs  Lab 03/09/20 0954 03/10/20 0440 03/11/20 0439  WBC 4.7 5.7 5.7  HGB 16.8 14.6 13.7  HCT 47.3 41.3 39.5  MCV 89.9 89.8 92.1  PLT 172 144* 135*   Basic Metabolic Panel: Recent Labs  Lab 03/09/20 0954 03/10/20 0440 03/11/20 0439  NA 141 142 137  K 3.8 3.9 3.6  CL 105 109 103  CO2 21* 26 26  GLUCOSE 106* 88 81  BUN 8 10 8   CREATININE 0.70 0.72 0.70  CALCIUM 9.5 8.9 8.8*   GFR: Estimated Creatinine Clearance: 103.3 mL/min (by C-G formula based on SCr of 0.7 mg/dL). Liver Function Tests: Recent Labs  Lab 03/09/20 0954 03/10/20 0440 03/11/20 0439  AST 129* 82* 60*  ALT 76* 58* 46*  ALKPHOS 104 87 74  BILITOT 2.1* 1.5* 1.3*  PROT 8.7* 7.9 7.4  ALBUMIN 4.3 3.9 3.6   Recent Labs  Lab 03/09/20 0954 03/10/20 0440 03/11/20 0439  LIPASE 589* 258* 62*   No results for input(s): AMMONIA in the last 168 hours. Coagulation Profile: No results for input(s): INR, PROTIME in the last 168 hours. Cardiac Enzymes: No results for input(s): CKTOTAL, CKMB, CKMBINDEX, TROPONINI in the last 168 hours. BNP (last 3 results) No results for input(s): PROBNP in the last 8760 hours. HbA1C: No results for input(s): HGBA1C in the last 72 hours. CBG: No results for input(s): GLUCAP in the last 168 hours. Lipid Profile: No results for input(s): CHOL, HDL, LDLCALC, TRIG, CHOLHDL, LDLDIRECT in the last 72 hours. Thyroid Function Tests: No results for input(s): TSH,  T4TOTAL, FREET4, T3FREE, THYROIDAB in the last 72 hours. Anemia Panel: No results for input(s): VITAMINB12, FOLATE, FERRITIN, TIBC, IRON, RETICCTPCT in the last 72 hours. Sepsis Labs: No results for input(s): PROCALCITON, LATICACIDVEN in the last 168 hours.  Recent Results (from the past 240 hour(s))  Respiratory Panel by RT PCR (Flu A&B, Covid) - Nasopharyngeal Swab     Status: None   Collection Time: 03/09/20  2:13 PM   Specimen: Nasopharyngeal Swab  Result Value Ref Range Status   SARS Coronavirus 2 by RT PCR NEGATIVE NEGATIVE Final    Comment: (NOTE) SARS-CoV-2 target nucleic acids are NOT DETECTED. The SARS-CoV-2 RNA is generally detectable in upper respiratoy specimens during the acute phase of infection. The lowest concentration  of SARS-CoV-2 viral copies this assay can detect is 131 copies/mL. A negative result does not preclude SARS-Cov-2 infection and should not be used as the sole basis for treatment or other patient management decisions. A negative result may occur with  improper specimen collection/handling, submission of specimen other than nasopharyngeal swab, presence of viral mutation(s) within the areas targeted by this assay, and inadequate number of viral copies (<131 copies/mL). A negative result must be combined with clinical observations, patient history, and epidemiological information. The expected result is Negative. Fact Sheet for Patients:  https://www.moore.com/ Fact Sheet for Healthcare Providers:  https://www.young.biz/ This test is not yet ap proved or cleared by the [place] FDA and  has been authorized for detection and/or diagnosis of SARS-CoV-2 by FDA under an Emergency Use Authorization (EUA). This EUA will remain  in effect (meaning this test can be used) for the duration of the COVID-19 declaration under Section 564(b)(1) of the Act, 21 U.S.C. section 360bbb-3(b)(1), unless the authorization is  terminated or revoked sooner.    Influenza A by PCR NEGATIVE NEGATIVE Final   Influenza B by PCR NEGATIVE NEGATIVE Final    Comment: (NOTE) The Xpert Xpress SARS-CoV-2/FLU/RSV assay is intended as an aid in  the diagnosis of influenza from Nasopharyngeal swab specimens and  should not be used as a sole basis for treatment. Nasal washings and  aspirates are unacceptable for Xpert Xpress SARS-CoV-2/FLU/RSV  testing. Fact Sheet for Patients: https://www.moore.com/ Fact Sheet for Healthcare Providers: https://www.young.biz/ This test is not yet approved or cleared by the [place] FDA and  has been authorized for detection and/or diagnosis of SARS-CoV-2 by  FDA under an Emergency Use Authorization (EUA). This EUA will remain  in effect (meaning this test can be used) for the duration of the  Covid-19 declaration under Section 564(b)(1) of the Act, 21  U.S.C. section 360bbb-3(b)(1), unless the authorization is  terminated or revoked. Performed at [facility], [address], [place], [place] [address]       Imaging Studies   No results found.   Medications   Scheduled Meds: . amLODipine  5 mg Oral Daily  . diphenhydrAMINE  12.5 mg Intravenous Once  . enoxaparin (LOVENOX) injection  40 mg Subcutaneous Q24H  . LORazepam  0-4 mg Intravenous Q12H   Or  . LORazepam  0-4 mg Oral Q12H  . tamsulosin  0.4 mg Oral QPC breakfast  . thiamine  100 mg Oral Daily   Or  . thiamine  100 mg Intravenous Daily   Continuous Infusions: . sodium chloride    . potassium chloride 10 mEq (03/12/20 0926)       LOS: 3 days    Time spent: 25 minutes    Pennie Banter, DO [facility]  03/12/2020, 9:39 AM    If 7PM-7AM, please contact night-coverage. How to contact the [facility] Attending or Consulting provider 7A - 7P or covering provider during after hours 7P -7A, for this patient?    1. Check the care team in [name] and look for  a) attending/consulting TRH provider listed and b) the [facility] team listed 2. Log into [email or web address] and use [place]'s universal password to access. If you do not have the password, please contact the hospital operator. 3. Locate the [facility] provider you are looking for under [facility] and page to a number that you can be directly reached. 4. If you still have difficulty reaching the provider, please page the [facility] (Director on Call) for the Hospitalists  listed on amion for assistance.

## 2020-03-13 LAB — MAGNESIUM: Magnesium: 1.7 mg/dL (ref 1.7–2.4)

## 2020-03-13 LAB — COMPREHENSIVE METABOLIC PANEL
ALT: 57 U/L — ABNORMAL HIGH (ref 0–44)
AST: 72 U/L — ABNORMAL HIGH (ref 15–41)
Albumin: 3.3 g/dL — ABNORMAL LOW (ref 3.5–5.0)
Alkaline Phosphatase: 62 U/L (ref 38–126)
Anion gap: 7 (ref 5–15)
BUN: <5 mg/dL — ABNORMAL LOW (ref 6–20)
CO2: 27 mmol/L (ref 22–32)
Calcium: 8.8 mg/dL — ABNORMAL LOW (ref 8.9–10.3)
Chloride: 102 mmol/L (ref 98–111)
Creatinine, Ser: 0.67 mg/dL (ref 0.61–1.24)
GFR calc Af Amer: >60 mL/min (ref >60)
GFR calc non Af Amer: >60 mL/min (ref >60)
Glucose, Bld: 119 mg/dL — ABNORMAL HIGH (ref 70–99)
Potassium: 3.3 mmol/L — ABNORMAL LOW (ref 3.5–5.1)
Sodium: 136 mmol/L (ref 135–145)
Total Bilirubin: 0.9 mg/dL (ref 0.3–1.2)
Total Protein: 7.1 g/dL (ref 6.5–8.1)

## 2020-03-13 MED ORDER — BISACODYL 5 MG PO TBEC: 5.0000 mg | DELAYED_RELEASE_TABLET | Freq: Every day | ORAL | Status: DC | PRN

## 2020-03-13 MED ORDER — SENNA 8.6 MG PO TABS
1.0000 | ORAL_TABLET | Freq: Every day | ORAL | Status: DC
  Administered 2020-03-13 – 2020-03-14 (×2): 8.6 mg via ORAL
  Filled 2020-03-13 (×3): qty 1

## 2020-03-13 MED ORDER — POTASSIUM CHLORIDE IN NACL 40-0.9 MEQ/L-% IV SOLN
INTRAVENOUS | Status: DC
  Administered 2020-03-13 (×2): 100 mL/h via INTRAVENOUS
  Filled 2020-03-13 (×6): qty 1000

## 2020-03-13 MED ORDER — MAGNESIUM SULFATE 2 GM/50ML IV SOLN
2.0000 g | Freq: Once | INTRAVENOUS | Status: AC
  Administered 2020-03-13: 09:00:00 2 g via INTRAVENOUS
  Filled 2020-03-13: qty 50

## 2020-03-13 NOTE — Progress Notes (Addendum)
PROGRESS NOTE    Bruce [name]   PFX:[id number]  DOB: 1971/04/29  PCP: Patient, No Pcp Per    DOA: 03/09/2020 LOS: 4   Brief Narrative   Bruce [name] is an 48 y.o. male with past medical history significant for pancreatitis secondary to alcohol use (admission just 2 months ago), history of paroxysmal atrial fibrillation and hepatitis C.  He was admitted on 03/09/20 with abdominal pain, nausea and vomiting secondary to acute alcoholic pancreatitis.  CT abdomen without any evidence of fluid collection.     Assessment & Plan   Principal Problem:   Pancreatitis Active Problems:   Hepatitis C infection   AF (paroxysmal atrial fibrillation) (HCC)   Polysubstance abuse (HCC)    Acute pancreatitis, likely secondary to alcohol, recurrent Slowly improving.   --continue supportive care --soft diet as tolerated --continue IV hydration --continue Dilaudid PRN pain control, Zofran PRN nausea  Hypokalemia - replacing by IV.  Monitor, replete as needed for goal K>4.0  BPH - patient complains of lower urinary tract symptoms consistent with prostate enlargement with weak, intermittent stream, frequency, nocturia. Improved with starting Flomax. --continue Flomax --follow up with PCP or urology  Alcohol abuse - no history of complicated withdrawal, but was tremulous and anxious in ED upon first arrival, treated with Ativan with good effect. --continue CIWA protocol with PRN Ativan per score  Hypertension - possibly due to pain, no known prior diagnosis.  BP's have been elevated. --start amlodipine 5 mg daily --monitor for improvement as pain resolves, may not need medication on d/c  Paroxysmal atrial fibrillation - rate controlled and in NSR on admission. 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   DVT prophylaxis: SCD's  Diet:  Diet Orders (From admission, onward)    Start     Ordered   03/13/20 0922  DIET SOFT Room service appropriate? Yes; Fluid consistency: Thin  Diet effective now    Question Answer Comment  Room service appropriate? Yes   Fluid consistency: Thin      03/13/20 0921            Code Status: Full Code     Subjective 03/13/20    Patient seen at bedside this morning.  No acute events reported. Says he's starting to feel a little better.  Can't tolerate fruit juices, but some other liquids okay.  He asks if he can try eggs or other soft foods today.  No fevers chills or other acute complaints.   Disposition Plan & Communication   Status is: Inpatient  Remains inpatient appropriate because:Ongoing active pain requiring inpatient pain management and IV treatments appropriate due to intensity of illness or inability to take PO   Dispo: The patient is from: Home              Anticipated d/c is to: Home              Anticipated d/c date is: 2 days              Patient currently is not medically stable to d/c.  Family Communication: None at bedside during encounter, will attempt to call   Consults, Procedures, Significant Events   Consultants:   None  Procedures:   None  Antimicrobials:   None    Objective  Vitals:   03/12/20 0854 03/13/20 0039 03/13/20 0054 03/13/20 0843  BP: 124/88 (!) 149/98  (!) 159/104  Pulse: 67  74 (!) 57  Resp: 16 16  18   Temp: 97.6 F (36.4 C) 98.8 F (37.1 C)  98 F (36.7 C)  TempSrc: Oral Oral  Oral  SpO2: 100%  100% 100%  Weight:      Height:        Intake/Output Summary (Last 24 hours) at 03/13/2020 1154 Last data filed at 03/13/2020 1042 Gross per 24 hour  Intake 2904.14 ml  Output --  Net 2904.14 ml   Filed Weights   03/09/20 0914  Weight: 74.8 kg    Physical Exam:  General exam: awake, alert, no acute distress Respiratory system:  CTAB, no wheezes, rales or rhonchi, normal respiratory effort. Cardiovascular system: normal S1/S2, RRR, no pedal edema.   Gastrointestinal system: soft, slightly less tender on palpation, no guarding, no rebound tenderness, +bowel sounds, does not tolerate deeper palpation.   Labs   Data Reviewed: I have personally reviewed following labs and imaging studies  CBC: Recent Labs  Lab 03/09/20 0954 03/10/20 0440 03/11/20 0439  WBC 4.7 5.7 5.7  HGB 16.8 14.6 13.7  HCT 47.3 41.3 39.5  MCV 89.9 89.8 92.1  PLT 172 144* 135*   Basic Metabolic Panel: Recent Labs  Lab 03/09/20 0954 03/10/20 0440 03/11/20 0439 03/13/20 0429  NA 141 142 137 136  K 3.8 3.9 3.6 3.3*  CL 105 109 103 102  CO2 21* 26 26 27   GLUCOSE 106* 88 81 119*  BUN 8 10 8  <5*  CREATININE 0.70 0.72 0.70 0.67  CALCIUM 9.5 8.9 8.8* 8.8*  MG  --   --   --  1.7   GFR: Estimated Creatinine Clearance: 103.3 mL/min (by C-G formula based on SCr of 0.67 mg/dL). Liver Function Tests: Recent Labs  Lab 03/09/20 0954 03/10/20 0440 03/11/20 0439 03/13/20 0429  AST 129* 82* 60* 72*  ALT 76* 58* 46* 57*  ALKPHOS 104 87 74 62  BILITOT 2.1* 1.5* 1.3* 0.9  PROT 8.7* 7.9 7.4 7.1  ALBUMIN 4.3 3.9 3.6 3.3*   Recent Labs  Lab 03/09/20 0954 03/10/20 0440 03/11/20 0439  LIPASE 589* 258* 62*   No results for input(s): AMMONIA in the last 168 hours. Coagulation Profile: No results for input(s): INR, PROTIME in the last 168 hours. Cardiac Enzymes: No results for input(s): CKTOTAL, CKMB, CKMBINDEX, TROPONINI in the last 168 hours. BNP (last 3 results) No results for input(s): PROBNP in the last 8760 hours. HbA1C: No results for input(s): HGBA1C in the last 72 hours. CBG: No results for input(s): GLUCAP in the last 168 hours. Lipid Profile: No results for input(s): CHOL, HDL, LDLCALC, TRIG, CHOLHDL, LDLDIRECT in the last 72 hours. Thyroid Function Tests: No results for input(s): TSH, T4TOTAL, FREET4, T3FREE, THYROIDAB  in the last 72 hours. Anemia Panel: No results for input(s): VITAMINB12, FOLATE, FERRITIN, TIBC, IRON, RETICCTPCT in the last 72 hours. Sepsis Labs: No results for input(s): PROCALCITON, LATICACIDVEN in the last 168 hours.  Recent Results (from the past 240 hour(s))  Respiratory Panel by RT PCR (Flu A&B, Covid) - Nasopharyngeal Swab     Status: None   Collection Time: 03/09/20  2:13 PM   Specimen: Nasopharyngeal Swab  Result Value Ref Range Status   SARS Coronavirus 2 by RT PCR NEGATIVE NEGATIVE Final    Comment: (NOTE) SARS-CoV-2 target nucleic acids are NOT DETECTED. The SARS-CoV-2 RNA is generally detectable  in upper respiratoy specimens during the acute phase of infection. The lowest concentration of SARS-CoV-2 viral copies this assay can detect is 131 copies/mL. A negative result does not preclude SARS-Cov-2 infection and should not be used as the sole basis for treatment or other patient management decisions. A negative result may occur with  improper specimen collection/handling, submission of specimen other than nasopharyngeal swab, presence of viral mutation(s) within the areas targeted by this assay, and inadequate number of viral copies (<131 copies/mL). A negative result must be combined with clinical observations, patient history, and epidemiological information. The expected result is Negative. Fact Sheet for Patients:  https://www.moore.com/ Fact Sheet for Healthcare Providers:  https://www.young.biz/ This test is not yet ap proved or cleared by the [place] FDA and  has been authorized for detection and/or diagnosis of SARS-CoV-2 by FDA under an Emergency Use Authorization (EUA). This EUA will remain  in effect (meaning this test can be used) for the duration of the COVID-19 declaration under Section 564(b)(1) of the Act, 21 U.S.C. section 360bbb-3(b)(1), unless the authorization is terminated or revoked sooner.     Influenza A by PCR NEGATIVE NEGATIVE Final   Influenza B by PCR NEGATIVE NEGATIVE Final    Comment: (NOTE) The Xpert Xpress SARS-CoV-2/FLU/RSV assay is intended as an aid in  the diagnosis of influenza from Nasopharyngeal swab specimens and  should not be used as a sole basis for treatment. Nasal washings and  aspirates are unacceptable for Xpert Xpress SARS-CoV-2/FLU/RSV  testing. Fact Sheet for Patients: https://www.moore.com/ Fact Sheet for Healthcare Providers: https://www.young.biz/ This test is not yet approved or cleared by the [place] FDA and  has been authorized for detection and/or diagnosis of SARS-CoV-2 by  FDA under an Emergency Use Authorization (EUA). This EUA will remain  in effect (meaning this test can be used) for the duration of the  Covid-19 declaration under Section 564(b)(1) of the Act, 21  U.S.C. section 360bbb-3(b)(1), unless the authorization is  terminated or revoked. Performed at [facility], [address], [place], [place] [address]       Imaging Studies   No results found.   Medications   Scheduled Meds: . amLODipine  5 mg Oral Daily  . diphenhydrAMINE  12.5 mg Intravenous Once  . enoxaparin (LOVENOX) injection  40 mg Subcutaneous Q24H  . LORazepam  0-4 mg Intravenous Q12H   Or  . LORazepam  0-4 mg Oral Q12H  . senna  1 tablet Oral Daily  . tamsulosin  0.4 mg Oral QPC breakfast  . thiamine  100 mg Oral Daily   Or  . thiamine  100 mg Intravenous Daily   Continuous Infusions: . 0.9 % NaCl with KCl 40 mEq / L 100 mL/hr (03/13/20 0835)       LOS: 4 days    Time spent: 20 minutes    Pennie Banter, DO [facility]  03/13/2020, 11:54 AM    If 7PM-7AM, please contact night-coverage. How to contact the [facility] Attending or Consulting provider 7A - 7P or covering provider during after hours 7P -7A, for this patient?    1. Check the care team in [facility] and look for a)  attending/consulting TRH provider listed and b) the [facility] team listed 2. Log into [email or web address] and use [place]'s universal password to access. If you do not have the password, please contact the hospital operator. 3. Locate the [facility] provider you are looking for under [facility] and page to a number that you can  be directly reached. 4. If you still have difficulty reaching the provider, please page the [facility] (Director on Call) for the Hospitalists listed on amion for assistance.

## 2020-03-14 LAB — COMPREHENSIVE METABOLIC PANEL
ALT: 65 U/L — ABNORMAL HIGH (ref 0–44)
AST: 73 U/L — ABNORMAL HIGH (ref 15–41)
Albumin: 3.6 g/dL (ref 3.5–5.0)
Alkaline Phosphatase: 65 U/L (ref 38–126)
Anion gap: 7 (ref 5–15)
BUN: 8 mg/dL (ref 6–20)
CO2: 26 mmol/L (ref 22–32)
Calcium: 8.9 mg/dL (ref 8.9–10.3)
Chloride: 103 mmol/L (ref 98–111)
Creatinine, Ser: 0.71 mg/dL (ref 0.61–1.24)
GFR calc Af Amer: >60 mL/min (ref >60)
GFR calc non Af Amer: >60 mL/min (ref >60)
Glucose, Bld: 112 mg/dL — ABNORMAL HIGH (ref 70–99)
Potassium: 3.8 mmol/L (ref 3.5–5.1)
Sodium: 136 mmol/L (ref 135–145)
Total Bilirubin: 0.8 mg/dL (ref 0.3–1.2)
Total Protein: 7.4 g/dL (ref 6.5–8.1)

## 2020-03-14 LAB — LIPASE, BLOOD: Lipase: 29 U/L (ref 11–51)

## 2020-03-14 MED ORDER — OXYCODONE-ACETAMINOPHEN 7.5-325 MG PO TABS
1.0000 | ORAL_TABLET | ORAL | Status: DC | PRN
  Administered 2020-03-14 – 2020-03-15 (×6): 1 via ORAL
  Filled 2020-03-14 (×6): qty 1

## 2020-03-14 MED ORDER — AMLODIPINE BESYLATE 10 MG PO TABS
10.0000 mg | ORAL_TABLET | Freq: Every day | ORAL | Status: DC
  Administered 2020-03-14 – 2020-03-15 (×2): 10 mg via ORAL
  Filled 2020-03-14 (×2): qty 1

## 2020-03-14 NOTE — Progress Notes (Addendum)
PROGRESS NOTE    Bruce [name]   VQQ:[id number]  DOB: 1971/09/28  PCP: Patient, No Pcp Per    DOA: 03/09/2020 LOS: 5   Brief Narrative   Bruce [name] is an 49 y.o. male with past medical history significant for pancreatitis secondary to alcohol use (admission just 2 months ago), history of paroxysmal atrial fibrillation and hepatitis C.  He was admitted on 03/09/20 with abdominal pain, nausea and vomiting secondary to acute alcoholic pancreatitis.  CT abdomen without any evidence of fluid collection.     Assessment & Plan   Principal Problem:   Pancreatitis Active Problems:   Hepatitis C infection   AF (paroxysmal atrial fibrillation) (HCC)   Polysubstance abuse (HCC)    Acute pancreatitis, likely secondary to alcohol, recurrent - may be developing chronic pancreatitis based on history he gives.  Slowly improving.   --continue supportive care --soft diet as tolerated --off IV fluids, tolerates water well --start on oral pain meds today --continue Dilaudid for breakthrough only --Zofran PRN nausea  Hypokalemia - replacing by IV.  Monitor, replete as needed for goal K>4.0  BPH - patient complains of lower urinary tract symptoms consistent with prostate enlargement with weak, intermittent stream, frequency, nocturia. Improved with starting Flomax. --continue Flomax --follow up with PCP or urology  Alcohol abuse - no history of complicated withdrawal, but was tremulous and anxious in ED upon first arrival, treated with Ativan with good effect. --continue CIWA protocol with PRN Ativan per score  Hypertension - possibly due to pain, no known prior diagnosis.  BP's have been elevated. --start amlodipine 5 mg daily --monitor for improvement as pain resolves, may not need medication on d/c  Paroxysmal atrial fibrillation - rate controlled and in NSR on admission. --telemetry monitoring --Per previous notes, patient was not considered a good candidate for  anticoagulation  Possible history of HIT Will avoid heparin as patient apparently may have had thrombocytopenia to Lovenox in the past  Patient BMI: Body mass index is 25.84 kg/m.   DVT prophylaxis: SCD's  Diet:  Diet Orders (From admission, onward)    Start     Ordered   03/13/20 0922  DIET SOFT Room service appropriate? Yes; Fluid consistency: Thin  Diet effective now    Question Answer Comment  Room service appropriate? Yes   Fluid consistency: Thin      03/13/20 0921            Code Status: Full Code     Subjective 03/14/20    Patient seen at bedside this morning and again this afternoon.  No acute events reported. He says feels a little better, was able to eat some eggs this AM.  Says juice still makes pain worse.  Does fine with water.  Ate potatoes for lunch and tolerated.  He is agreeable to transition to PO pain meds and possible d/c tomorrow.   Disposition Plan & Communication   Status is: Inpatient  Remains inpatient appropriate because:Ongoing active pain requiring inpatient pain management and IV treatments appropriate due to intensity of illness or inability to take PO   Dispo: The patient is from: Home              Anticipated d/c is to: Home              Anticipated d/c date is: tomorrow 5/9              Patient currently is not medically stable to d/c.  Family Communication: None at bedside  during encounter, will attempt to call   Consults, Procedures, Significant Events   Consultants:   None  Procedures:   None  Antimicrobials:   None    Objective   Vitals:   03/13/20 1800 03/14/20 0030 03/14/20 0749 03/14/20 1639  BP: (!) 164/78  (!) 149/108 (!) 162/103  Pulse: 76  63 79  Resp: 18  (!) 22 13  Temp: 98.7 F (37.1 C) 98.4 F (36.9 C) 97.8 F (36.6 C) 98.1 F (36.7 C)  TempSrc: Oral Oral Oral Oral  SpO2: 100%  100% 100%  Weight:      Height:        Intake/Output Summary (Last 24 hours) at 03/14/2020 1645 Last data filed  at 03/14/2020 1055 Gross per 24 hour  Intake 1992.93 ml  Output 575 ml  Net 1417.93 ml   Filed Weights   03/09/20 0914  Weight: 74.8 kg    Physical Exam:  General exam: awake, alert, no acute distress Respiratory system: CTAB, no wheezes, rales or rhonchi, normal respiratory effort. Cardiovascular system: normal S1/S2, RRR, no pedal edema.   Gastrointestinal system: soft, slightly less tender on palpation, no guarding, no rebound tenderness, +bowel sounds, does not tolerate deeper palpation.   Labs   Data Reviewed: I have personally reviewed following labs and imaging studies  CBC: Recent Labs  Lab 03/09/20 0954 03/10/20 0440 03/11/20 0439  WBC 4.7 5.7 5.7  HGB 16.8 14.6 13.7  HCT 47.3 41.3 39.5  MCV 89.9 89.8 92.1  PLT 172 144* 314*   Basic Metabolic Panel: Recent Labs  Lab 03/09/20 0954 03/10/20 0440 03/11/20 0439 03/13/20 0429 03/14/20 0503  NA 141 142 137 136 136  K 3.8 3.9 3.6 3.3* 3.8  CL 105 109 103 102 103  CO2 21* 26 26 27 26   GLUCOSE 106* 88 81 119* 112*  BUN 8 10 8  <5* 8  CREATININE 0.70 0.72 0.70 0.67 0.71  CALCIUM 9.5 8.9 8.8* 8.8* 8.9  MG  --   --   --  1.7  --    GFR: Estimated Creatinine Clearance: 103.3 mL/min (by C-G formula based on SCr of 0.71 mg/dL). Liver Function Tests: Recent Labs  Lab 03/09/20 0954 03/10/20 0440 03/11/20 0439 03/13/20 0429 03/14/20 0503  AST 129* 82* 60* 72* 73*  ALT 76* 58* 46* 57* 65*  ALKPHOS 104 87 74 62 65  BILITOT 2.1* 1.5* 1.3* 0.9 0.8  PROT 8.7* 7.9 7.4 7.1 7.4  ALBUMIN 4.3 3.9 3.6 3.3* 3.6   Recent Labs  Lab 03/09/20 0954 03/10/20 0440 03/11/20 0439 03/14/20 0503  LIPASE 589* 258* 62* 29   No results for input(s): AMMONIA in the last 168 hours. Coagulation Profile: No results for input(s): INR, PROTIME in the last 168 hours. Cardiac Enzymes: No results for input(s): CKTOTAL, CKMB, CKMBINDEX, TROPONINI in the last 168 hours. BNP (last 3 results) No results for input(s): PROBNP in the  last 8760 hours. HbA1C: No results for input(s): HGBA1C in the last 72 hours. CBG: No results for input(s): GLUCAP in the last 168 hours. Lipid Profile: No results for input(s): CHOL, HDL, LDLCALC, TRIG, CHOLHDL, LDLDIRECT in the last 72 hours. Thyroid Function Tests: No results for input(s): TSH, T4TOTAL, FREET4, T3FREE, THYROIDAB in the last 72 hours. Anemia Panel: No results for input(s): VITAMINB12, FOLATE, FERRITIN, TIBC, IRON, RETICCTPCT in the last 72 hours. Sepsis Labs: No results for input(s): PROCALCITON, LATICACIDVEN in the last 168 hours.  Recent Results (from the past 240 hour(s))  Respiratory Panel  by RT PCR (Flu A&B, Covid) - Nasopharyngeal Swab     Status: None   Collection Time: 03/09/20  2:13 PM   Specimen: Nasopharyngeal Swab  Result Value Ref Range Status   SARS Coronavirus 2 by RT PCR NEGATIVE NEGATIVE Final    Comment: (NOTE) SARS-CoV-2 target nucleic acids are NOT DETECTED. The SARS-CoV-2 RNA is generally detectable in upper respiratoy specimens during the acute phase of infection. The lowest concentration of SARS-CoV-2 viral copies this assay can detect is 131 copies/mL. A negative result does not preclude SARS-Cov-2 infection and should not be used as the sole basis for treatment or other patient management decisions. A negative result may occur with  improper specimen collection/handling, submission of specimen other than nasopharyngeal swab, presence of viral mutation(s) within the areas targeted by this assay, and inadequate number of viral copies (<131 copies/mL). A negative result must be combined with clinical observations, patient history, and epidemiological information. The expected result is Negative. Fact Sheet for Patients:  https://www.moore.com/ Fact Sheet for Healthcare Providers:  https://www.young.biz/ This test is not yet ap proved or cleared by the [place] FDA and  has been authorized for  detection and/or diagnosis of SARS-CoV-2 by FDA under an Emergency Use Authorization (EUA). This EUA will remain  in effect (meaning this test can be used) for the duration of the COVID-19 declaration under Section 564(b)(1) of the Act, 21 U.S.C. section 360bbb-3(b)(1), unless the authorization is terminated or revoked sooner.    Influenza A by PCR NEGATIVE NEGATIVE Final   Influenza B by PCR NEGATIVE NEGATIVE Final    Comment: (NOTE) The Xpert Xpress SARS-CoV-2/FLU/RSV assay is intended as an aid in  the diagnosis of influenza from Nasopharyngeal swab specimens and  should not be used as a sole basis for treatment. Nasal washings and  aspirates are unacceptable for Xpert Xpress SARS-CoV-2/FLU/RSV  testing. Fact Sheet for Patients: https://www.moore.com/ Fact Sheet for Healthcare Providers: https://www.young.biz/ This test is not yet approved or cleared by the [place] FDA and  has been authorized for detection and/or diagnosis of SARS-CoV-2 by  FDA under an Emergency Use Authorization (EUA). This EUA will remain  in effect (meaning this test can be used) for the duration of the  Covid-19 declaration under Section 564(b)(1) of the Act, 21  U.S.C. section 360bbb-3(b)(1), unless the authorization is  terminated or revoked. Performed at [facility], [address], [place], [place] [address]       Imaging Studies   No results found.   Medications   Scheduled Meds: . amLODipine  10 mg Oral Daily  . diphenhydrAMINE  12.5 mg Intravenous Once  . enoxaparin (LOVENOX) injection  40 mg Subcutaneous Q24H  . senna  1 tablet Oral Daily  . tamsulosin  0.4 mg Oral QPC breakfast  . thiamine  100 mg Oral Daily   Or  . thiamine  100 mg Intravenous Daily   Continuous Infusions:      LOS: 5 days    Time spent: 35 minutes total with > 50% in coordination of care and spent in direct patient contact and providing  counseling.    Pennie Banter, DO [facility]  03/14/2020, 4:45 PM    If 7PM-7AM, please contact night-coverage. How to contact the [facility] Attending or Consulting provider 7A - 7P or covering provider during after hours 7P -7A, for this patient?    1. Check the care team in [facility] and look for a) attending/consulting [facility] provider listed and b) the [facility]  team listed 2. Log into [email or web address] and use [facility]'s universal password to access. If you do not have the password, please contact the hospital operator. 3. Locate the [facility] provider you are looking for under [facility] and page to a number that you can be directly reached. 4. If you still have difficulty reaching the provider, please page the [facility] (Director on Call) for the Hospitalists listed on amion for assistance.

## 2020-03-15 MED ORDER — TAMSULOSIN HCL 0.4 MG PO CAPS: 0.4000 mg | ORAL_CAPSULE | Freq: Every day | ORAL | 1 refills | Status: DC

## 2020-03-15 MED ORDER — AMLODIPINE BESYLATE 5 MG PO TABS: 5.0000 mg | ORAL_TABLET | Freq: Every day | ORAL | 1 refills | Status: DC

## 2020-03-15 MED ORDER — OXYCODONE-ACETAMINOPHEN 7.5-325 MG PO TABS: 1.0000 | ORAL_TABLET | ORAL | 0 refills | Status: DC | PRN

## 2020-03-15 MED ORDER — ONDANSETRON HCL 8 MG PO TABS
8.0000 mg | ORAL_TABLET | Freq: Three times a day (TID) | ORAL | 1 refills | Status: DC | PRN
Start: 2020-03-15 — End: 2020-05-06

## 2020-03-15 NOTE — Progress Notes (Signed)
MD order received in [facility] to discharge pt home today; verbally reviewed AVS with pt, gave printed Rx to pt for Percocet to take to a pharmacy to be filled; no questions voiced at this time; pt discharged via wheelchair by nursing to the visitor's entrance

## 2020-03-15 NOTE — Discharge Summary (Signed)
Physician Discharge Summary  Bruce [name] ZOX:[id number] DOB: Oct 08, 1971 DOA: 03/09/2020  PCP: Patient, No Pcp Per  Admit date: 03/09/2020 Discharge date: 03/15/2020  Admitted From: home Disposition:  home  Recommendations for Outpatient Follow-up:  1. Follow up with PCP in 1-2 weeks 2. Please obtain BMP/CBC in one week   Home Health: No  Equipment/Devices: None   Discharge Condition: Stable  CODE STATUS: Full  Diet recommendation: Soft/bland, advance as tolerated   Discharge Diagnoses: Principal Problem:   Pancreatitis Active Problems:   Hepatitis C infection   AF (paroxysmal atrial fibrillation) (HCC)   Polysubstance abuse (HCC)    Summary of HPI and Hospital Course:  Bruce NeasBilly Pernell is an 49 y.o. male with past medical history significant for pancreatitis secondary to alcohol use (admission just 2 months ago), history of paroxysmal atrial fibrillation and hepatitis C.  He was admitted on 03/09/20 with abdominal pain, nausea and vomiting secondary to acute alcoholic pancreatitis.  CT abdomen without any evidence of fluid collection.    Acute pancreatitis, likely secondary to alcohol, recurrent - may be developing chronic pancreatitis based on history he gives.  Slowly improving.  Treated conservatively with IV fluids, antiemetics, pain control.  Tolerating soft/bland diet and pain controlled with oral medications at time of discharge.    Hypokalemia - replaced.  Monitor, replete as needed.  Follow up BMP in 1-2 weeks.    BPH - patient complains of lower urinary tract symptoms consistent with prostate enlargement with weak, intermittent stream, frequency, nocturia. Improved with starting Flomax, prescribed on discharge.  Follow up with PCP or urology.  Alcoholabuse- no history of complicated withdrawal, but was tremulous and anxious in ED upon first arrival, treated with Ativan with good effect.  Managed with CIWA protocol with PRN Ativan.  Uneventful course of  withdrawal.  Hypertension - possibly due to pain, no known prior diagnosis.  BP's have been elevated.  Started on amlodipine, prescribed on discharge.  Follow up with PCP.  Patient given application for [facility].  Paroxysmal atrial fibrillation - rate controlled and in NSR on admission. He was monitored on telemetry.  No issues.  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   Call MD for:  persistant dizziness or light-headedness   Complete by: As directed    Call MD for:  persistant nausea and vomiting   Complete by: As directed    Call MD for:  severe uncontrolled pain   Complete by: As directed    Call MD for:  temperature >100.4   Complete by: As directed    Diet - low sodium heart healthy   Complete by: As directed    Discharge instructions   Complete by: As directed    Advance your diet as tolerated.  Stick to LOW FAT foods.  If any certain food makes pain worse, avoid that for a few days and try it again.  Take pain medicine only as needed, and spread out doses if pain is improving to see how you feel without medicine.  Please keep pain medicine in a safe, secure location.  New Medicines -  Amlodipine (brand name Norvasc) - is for blood pressure. Tamsulosin (brand name Flomax) - is for prostate enlargement, to improve urine stream and prevent  frequent urination. Pantoprazole (brand name Protonix) - is to reduce stomach acid, and protect the lining of your stomach (you may have an ulcer or irritated stomach lining.  This medicine will help that to heal.  If you ever see dark/tarry-looking/black stools, you may have bleeding and you should call your doctor.  Please fill out the paperwork for [facility] - they can be your primary care clinic for  medication refills and follow ups on blood pressure, prostate, etc.   Increase activity slowly   Complete by: As directed      Allergies as of 03/15/2020      Reactions   Penicillins Other (See Comments)   Did it involve swelling of the face/tongue/throat, SOB, or low BP? No Did it involve sudden or severe rash/hives, skin peeling, or any reaction on the inside of your mouth or nose? No Did you need to seek medical attention at a hospital or doctor's office? No When did it last happen? If all above answers are "NO", may proceed with cephalosporin use.      Medication List    STOP taking these medications   folic acid 1 MG tablet Commonly known as: FOLVITE   oxyCODONE 5 MG immediate release tablet Commonly known as: Oxy IR/ROXICODONE   thiamine 100 MG tablet     TAKE these medications   acetaminophen 500 MG tablet Commonly known as: TYLENOL Take 500-1,000 mg by mouth every 6 (six) hours as needed for mild pain or fever.   amLODipine 5 MG tablet Commonly known as: NORVASC Take 1 tablet (5 mg total) by mouth daily. Start taking on: Mar 16, 2020   ondansetron 8 MG tablet Commonly known as: Zofran Take 1 tablet (8 mg total) by mouth every 8 (eight) hours as needed for nausea or vomiting. What changed:   medication strength  how much to take  when to take this  reasons to take this   oxyCODONE-acetaminophen 7.5-325 MG tablet Commonly known as: PERCOCET Take 1 tablet by mouth every 4 (four) hours as needed for up to 5 days for severe pain.   tamsulosin 0.4 MG Caps capsule Commonly known as: FLOMAX Take 1 capsule (0.4 mg total) by mouth daily after breakfast. Start taking on: Mar 16, 2020       Allergies  Allergen Reactions  . Penicillins Other (See Comments)    Did it involve swelling of the face/tongue/throat, SOB, or low BP? No Did it involve sudden or severe rash/hives, skin peeling, or any reaction on the inside of your mouth or nose? No Did you  need to seek medical attention at a hospital or doctor's office? No When did it last happen? If all above answers are "NO", may proceed with cephalosporin use.    Consultations:      Procedures/Studies: DG Chest 2 View  Result Date: 03/09/2020 CLINICAL DATA:  Chest pain. Additional history provided: Left-sided chest pain and lower abdominal pain since yesterday. EXAM: CHEST - 2 VIEW COMPARISON:  Chest radiograph 03/13/2020 FINDINGS: Heart size within normal limits. There is no appreciable airspace consolidation. No evidence of pleural effusion or pneumothorax. No acute bony abnormality identified. IMPRESSION: No evidence of acute cardiopulmonary abnormality. Electronically Signed   By: Jackey Loge DO   On: 03/09/2020 09:56   CT ABDOMEN PELVIS W CONTRAST  Result Date: 03/09/2020 CLINICAL DATA:  Abdominal pain, nausea and distension since yesterday. History of hepatitis-C. EXAM: CT ABDOMEN AND PELVIS WITH CONTRAST TECHNIQUE: Multidetector CT imaging of the abdomen and  pelvis was performed using the standard protocol following bolus administration of intravenous contrast. CONTRAST:  OMNIPAQUE IOHEXOL 300 MG/ML  SOLN COMPARISON:  01/12/2020 FINDINGS: Lower chest: Unremarkable. Hepatobiliary: Stable diffuse low density of the liver relative to the spleen. Normal appearing gallbladder. Pancreas: Mild diffuse peripancreatic edema and soft tissue stranding without significant change. No interval fluid collections. Spleen: Normal in size without focal abnormality. Adrenals/Urinary Tract: Adrenal glands are unremarkable. Kidneys are normal, without renal calculi, focal lesion, or hydronephrosis. Bladder is unremarkable. Stomach/Bowel: Stomach is within normal limits. Appendix appears normal. No evidence of bowel wall thickening, distention, or inflammatory changes. Vascular/Lymphatic: No significant vascular findings are present. No enlarged abdominal or pelvic lymph nodes. A normal sized calcified  lymph node anterior to the caudate lobe of the liver is unchanged. Reproductive: Moderately large bilateral hydroceles with moderate anterior scrotal skin thickening with 2 coarse calcifications in the left scrotum, without significant change. Prostate is unremarkable. Other: No abdominal wall hernia or abnormality. Musculoskeletal: Mild lumbar spine degenerative changes. These include moderate facet degenerative changes at the L4-5 grade 1 anterolisthesis. IMPRESSION: 1. Stable mild diffuse peripancreatic edema and soft tissue stranding, compatible with acute pancreatitis. 2. Stable diffuse hepatic steatosis. 3. Moderately large bilateral hydroceles with moderate anterior scrotal skin thickening with 2 scrotal pearls on the left, without significant change. Electronically Signed   By: Beckie Salts M.D.   On: 03/09/2020 12:34       Subjective: Patient seen at bedside.  Reports feeling better.  Tolerating some foods, potatoes best tolerated.  No worsening pain or other acute complaints.    Discharge Exam: Vitals:   03/14/20 2016 03/15/20 0810  BP: 103/68 124/88  Pulse:  (!) 59  Resp:  15  Temp:  97.7 F (36.5 C)  SpO2:  100%   Vitals:   03/14/20 1639 03/14/20 2011 03/14/20 2016 03/15/20 0810  BP: (!) 162/103 111/67 103/68 124/88  Pulse: 79 64  (!) 59  Resp: 13   15  Temp: 98.1 F (36.7 C)   97.7 F (36.5 C)  TempSrc: Oral   Oral  SpO2: 100%   100%  Weight:      Height:        General: Pt is alert, awake, not in acute distress Cardiovascular: RRR, S1/S2 +, no rubs, no gallops Respiratory: CTA bilaterally, no wheezing, no rhonchi Abdominal: Soft, NT, ND, bowel sounds + Extremities: no edema, no cyanosis    The results of significant diagnostics from this hospitalization (including imaging, microbiology, ancillary and laboratory) are listed below for reference.     Microbiology: Recent Results (from the past 240 hour(s))  Respiratory Panel by RT PCR (Flu A&B, Covid) -  Nasopharyngeal Swab     Status: None   Collection Time: 03/09/20  2:13 PM   Specimen: Nasopharyngeal Swab  Result Value Ref Range Status   SARS Coronavirus 2 by RT PCR NEGATIVE NEGATIVE Final    Comment: (NOTE) SARS-CoV-2 target nucleic acids are NOT DETECTED. The SARS-CoV-2 RNA is generally detectable in upper respiratoy specimens during the acute phase of infection. The lowest concentration of SARS-CoV-2 viral copies this assay can detect is 131 copies/mL. A negative result does not preclude SARS-Cov-2 infection and should not be used as the sole basis for treatment or other patient management decisions. A negative result may occur with  improper specimen collection/handling, submission of specimen other than nasopharyngeal swab, presence of viral mutation(s) within the areas targeted by this assay, and inadequate number of viral copies (<131 copies/mL).  A negative result must be combined with clinical observations, patient history, and epidemiological information. The expected result is Negative. Fact Sheet for Patients:  PinkCheek.be Fact Sheet for Healthcare Providers:  GravelBags.it This test is not yet ap proved or cleared by the [place] FDA and  has been authorized for detection and/or diagnosis of SARS-CoV-2 by FDA under an Emergency Use Authorization (EUA). This EUA will remain  in effect (meaning this test can be used) for the duration of the COVID-19 declaration under Section 564(b)(1) of the Act, 21 U.S.C. section 360bbb-3(b)(1), unless the authorization is terminated or revoked sooner.    Influenza A by PCR NEGATIVE NEGATIVE Final   Influenza B by PCR NEGATIVE NEGATIVE Final    Comment: (NOTE) The Xpert Xpress SARS-CoV-2/FLU/RSV assay is intended as an aid in  the diagnosis of influenza from Nasopharyngeal swab specimens and  should not be used as a sole basis for treatment. Nasal washings and  aspirates  are unacceptable for Xpert Xpress SARS-CoV-2/FLU/RSV  testing. Fact Sheet for Patients: PinkCheek.be Fact Sheet for Healthcare Providers: GravelBags.it This test is not yet approved or cleared by the [place] FDA and  has been authorized for detection and/or diagnosis of SARS-CoV-2 by  FDA under an Emergency Use Authorization (EUA). This EUA will remain  in effect (meaning this test can be used) for the duration of the  Covid-19 declaration under Section 564(b)(1) of the Act, 21  U.S.C. section 360bbb-3(b)(1), unless the authorization is  terminated or revoked. Performed at [facility], [address] [place], [place] [address]      Labs: BNP (last 3 results) No results for input(s): BNP in the last 8760 hours. Basic Metabolic Panel: Recent Labs  Lab 03/09/20 0954 03/10/20 0440 03/11/20 0439 03/13/20 0429 03/14/20 0503  NA 141 142 137 136 136  K 3.8 3.9 3.6 3.3* 3.8  CL 105 109 103 102 103  CO2 21* 26 26 27 26   GLUCOSE 106* 88 81 119* 112*  BUN 8 10 8  <5* 8  CREATININE 0.70 0.72 0.70 0.67 0.71  CALCIUM 9.5 8.9 8.8* 8.8* 8.9  MG  --   --   --  1.7  --    Liver Function Tests: Recent Labs  Lab 03/09/20 0954 03/10/20 0440 03/11/20 0439 03/13/20 0429 03/14/20 0503  AST 129* 82* 60* 72* 73*  ALT 76* 58* 46* 57* 65*  ALKPHOS 104 87 74 62 65  BILITOT 2.1* 1.5* 1.3* 0.9 0.8  PROT 8.7* 7.9 7.4 7.1 7.4  ALBUMIN 4.3 3.9 3.6 3.3* 3.6   Recent Labs  Lab 03/09/20 0954 03/10/20 0440 03/11/20 0439 03/14/20 0503  LIPASE 589* 258* 62* 29   No results for input(s): AMMONIA in the last 168 hours. CBC: Recent Labs  Lab 03/09/20 0954 03/10/20 0440 03/11/20 0439  WBC 4.7 5.7 5.7  HGB 16.8 14.6 13.7  HCT 47.3 41.3 39.5  MCV 89.9 89.8 92.1  PLT 172 144* 135*   Cardiac Enzymes: No results for input(s): CKTOTAL, CKMB, CKMBINDEX, TROPONINI in the last 168 hours. BNP: Invalid input(s):  POCBNP CBG: No results for input(s): GLUCAP in the last 168 hours. D-Dimer No results for input(s): DDIMER in the last 72 hours. Hgb A1c No results for input(s): HGBA1C in the last 72 hours. Lipid Profile No results for input(s): CHOL, HDL, LDLCALC, TRIG, CHOLHDL, LDLDIRECT in the last 72 hours. Thyroid function studies No results for input(s): TSH, T4TOTAL, T3FREE, THYROIDAB in the last 72 hours.  Invalid input(s): FREET3 Anemia  work up No results for input(s): VITAMINB12, FOLATE, FERRITIN, TIBC, IRON, RETICCTPCT in the last 72 hours. Urinalysis    Component Value Date/Time   COLORURINE YELLOW 03/09/2020 0954   APPEARANCEUR CLEAR 03/09/2020 0954   LABSPEC 1.020 03/09/2020 0954   PHURINE 6.0 03/09/2020 0954   GLUCOSEU NEGATIVE 03/09/2020 0954   HGBUR NEGATIVE 03/09/2020 0954   BILIRUBINUR SMALL (A) 03/09/2020 0954   KETONESUR 40 (A) 03/09/2020 0954   PROTEINUR 30 (A) 03/09/2020 0954   UROBILINOGEN 1.0 05/01/2012 0835   NITRITE NEGATIVE 03/09/2020 0954   LEUKOCYTESUR NEGATIVE 03/09/2020 0954   Sepsis Labs Invalid input(s): PROCALCITONIN,  WBC,  LACTICIDVEN Microbiology Recent Results (from the past 240 hour(s))  Respiratory Panel by RT PCR (Flu A&B, Covid) - Nasopharyngeal Swab     Status: None   Collection Time: 03/09/20  2:13 PM   Specimen: Nasopharyngeal Swab  Result Value Ref Range Status   SARS Coronavirus 2 by RT PCR NEGATIVE NEGATIVE Final    Comment: (NOTE) SARS-CoV-2 target nucleic acids are NOT DETECTED. The SARS-CoV-2 RNA is generally detectable in upper respiratoy specimens during the acute phase of infection. The lowest concentration of SARS-CoV-2 viral copies this assay can detect is 131 copies/mL. A negative result does not preclude SARS-Cov-2 infection and should not be used as the sole basis for treatment or other patient management decisions. A negative result may occur with  improper specimen collection/handling, submission of specimen other than  nasopharyngeal swab, presence of viral mutation(s) within the areas targeted by this assay, and inadequate number of viral copies (<131 copies/mL). A negative result must be combined with clinical observations, patient history, and epidemiological information. The expected result is Negative. Fact Sheet for Patients:  https://www.moore.com/ Fact Sheet for Healthcare Providers:  https://www.young.biz/ This test is not yet ap proved or cleared by the [place] FDA and  has been authorized for detection and/or diagnosis of SARS-CoV-2 by FDA under an Emergency Use Authorization (EUA). This EUA will remain  in effect (meaning this test can be used) for the duration of the COVID-19 declaration under Section 564(b)(1) of the Act, 21 U.S.C. section 360bbb-3(b)(1), unless the authorization is terminated or revoked sooner.    Influenza A by PCR NEGATIVE NEGATIVE Final   Influenza B by PCR NEGATIVE NEGATIVE Final    Comment: (NOTE) The Xpert Xpress SARS-CoV-2/FLU/RSV assay is intended as an aid in  the diagnosis of influenza from Nasopharyngeal swab specimens and  should not be used as a sole basis for treatment. Nasal washings and  aspirates are unacceptable for Xpert Xpress SARS-CoV-2/FLU/RSV  testing. Fact Sheet for Patients: https://www.moore.com/ Fact Sheet for Healthcare Providers: https://www.young.biz/ This test is not yet approved or cleared by the [place] FDA and  has been authorized for detection and/or diagnosis of SARS-CoV-2 by  FDA under an Emergency Use Authorization (EUA). This EUA will remain  in effect (meaning this test can be used) for the duration of the  Covid-19 declaration under Section 564(b)(1) of the Act, 21  U.S.C. section 360bbb-3(b)(1), unless the authorization is  terminated or revoked. Performed at [facility], [address] [place], [place] [address]       Time coordinating discharge: Over 30 minutes  SIGNED:   Pennie Banter, DO [facility] 03/15/2020, 12:04 PM   If 7PM-7AM, please contact night-coverage [email or web address]

## 2020-03-19 ENCOUNTER — Telehealth: Payer: Self-pay | Admitting: General Practice

## 2020-03-19 NOTE — Telephone Encounter (Signed)
I called patient to go over paperwork requirements to establish care at [facility]. Wife answered and said they were planning on bringing the completed application either today or Tuesday 5/18

## 2020-05-03 ENCOUNTER — Inpatient Hospital Stay
Admission: EM | Admit: 2020-05-03 | Discharge: 2020-05-06 | DRG: 440 | Disposition: A | Discharge status: Home / Self Care | Payer: Self-pay | Attending: Internal Medicine | Admitting: Internal Medicine

## 2020-05-03 ENCOUNTER — Other Ambulatory Visit: Payer: Self-pay

## 2020-05-03 ENCOUNTER — Observation Stay: Payer: Self-pay

## 2020-05-03 ENCOUNTER — Emergency Department: Payer: Self-pay

## 2020-05-03 DIAGNOSIS — F1410 Cocaine abuse, uncomplicated: Secondary | ICD-10-CM | POA: Diagnosis present

## 2020-05-03 DIAGNOSIS — K859 Acute pancreatitis without necrosis or infection, unspecified: Secondary | ICD-10-CM | POA: Diagnosis present

## 2020-05-03 DIAGNOSIS — K760 Fatty (change of) liver, not elsewhere classified: Secondary | ICD-10-CM | POA: Diagnosis present

## 2020-05-03 DIAGNOSIS — K852 Alcohol induced acute pancreatitis without necrosis or infection: Principal | ICD-10-CM | POA: Diagnosis present

## 2020-05-03 DIAGNOSIS — I480 Paroxysmal atrial fibrillation: Secondary | ICD-10-CM | POA: Diagnosis present

## 2020-05-03 DIAGNOSIS — F17210 Nicotine dependence, cigarettes, uncomplicated: Secondary | ICD-10-CM | POA: Diagnosis present

## 2020-05-03 DIAGNOSIS — B1920 Unspecified viral hepatitis C without hepatic coma: Secondary | ICD-10-CM | POA: Diagnosis present

## 2020-05-03 DIAGNOSIS — Z20822 Contact with and (suspected) exposure to covid-19: Secondary | ICD-10-CM | POA: Diagnosis present

## 2020-05-03 DIAGNOSIS — F1910 Other psychoactive substance abuse, uncomplicated: Secondary | ICD-10-CM | POA: Diagnosis present

## 2020-05-03 DIAGNOSIS — F1010 Alcohol abuse, uncomplicated: Secondary | ICD-10-CM | POA: Diagnosis present

## 2020-05-03 LAB — COMPREHENSIVE METABOLIC PANEL
ALT: 45 U/L — ABNORMAL HIGH (ref 0–44)
AST: 63 U/L — ABNORMAL HIGH (ref 15–41)
Albumin: 4.3 g/dL (ref 3.5–5.0)
Alkaline Phosphatase: 83 U/L (ref 38–126)
Anion gap: 16 — ABNORMAL HIGH (ref 5–15)
BUN: 8 mg/dL (ref 6–20)
CO2: 21 mmol/L — ABNORMAL LOW (ref 22–32)
Calcium: 9.6 mg/dL (ref 8.9–10.3)
Chloride: 103 mmol/L (ref 98–111)
Creatinine, Ser: 0.9 mg/dL (ref 0.61–1.24)
GFR calc Af Amer: >60 mL/min (ref >60)
GFR calc non Af Amer: >60 mL/min (ref >60)
Glucose, Bld: 112 mg/dL — ABNORMAL HIGH (ref 70–99)
Potassium: 3.7 mmol/L (ref 3.5–5.1)
Sodium: 140 mmol/L (ref 135–145)
Total Bilirubin: 1.1 mg/dL (ref 0.3–1.2)
Total Protein: 8.5 g/dL — ABNORMAL HIGH (ref 6.5–8.1)

## 2020-05-03 LAB — URINE DRUG SCREEN, QUALITATIVE (ARMC ONLY)
Amphetamines, Ur Screen: NOT DETECTED
Barbiturates, Ur Screen: NOT DETECTED
Benzodiazepine, Ur Scrn: NOT DETECTED
Cannabinoid 50 Ng, Ur ~~LOC~~: NOT DETECTED
Cocaine Metabolite,Ur ~~LOC~~: POSITIVE — AB
MDMA (Ecstasy)Ur Screen: NOT DETECTED
Methadone Scn, Ur: NOT DETECTED
Opiate, Ur Screen: NOT DETECTED
Phencyclidine (PCP) Ur S: NOT DETECTED
Tricyclic, Ur Screen: NOT DETECTED

## 2020-05-03 LAB — URINALYSIS, COMPLETE (UACMP) WITH MICROSCOPIC
Bacteria, UA: NONE SEEN
Bilirubin Urine: NEGATIVE
Glucose, UA: NEGATIVE mg/dL
Hgb urine dipstick: NEGATIVE
Ketones, ur: 5 mg/dL — AB
Leukocytes,Ua: NEGATIVE
Nitrite: NEGATIVE
Protein, ur: NEGATIVE mg/dL
Specific Gravity, Urine: 1.018 (ref 1.005–1.030)
Squamous Epithelial / HPF: NONE SEEN (ref 0–5)
pH: 8 (ref 5.0–8.0)

## 2020-05-03 LAB — CBC WITH DIFFERENTIAL/PLATELET
Abs Immature Granulocytes: 0.01 10*3/uL (ref 0.00–0.07)
Basophils Absolute: 0 10*3/uL (ref 0.0–0.1)
Basophils Relative: 0 %
Eosinophils Absolute: 0 10*3/uL (ref 0.0–0.5)
Eosinophils Relative: 1 %
HCT: 44.9 % (ref 39.0–52.0)
Hemoglobin: 16.6 g/dL (ref 13.0–17.0)
Immature Granulocytes: 0 %
Lymphocytes Relative: 26 %
Lymphs Abs: 1.5 10*3/uL (ref 0.7–4.0)
MCH: 32.3 pg (ref 26.0–34.0)
MCHC: 37 g/dL — ABNORMAL HIGH (ref 30.0–36.0)
MCV: 87.4 fL (ref 80.0–100.0)
Monocytes Absolute: 0.6 10*3/uL (ref 0.1–1.0)
Monocytes Relative: 10 %
Neutro Abs: 3.8 10*3/uL (ref 1.7–7.7)
Neutrophils Relative %: 63 %
Platelets: 226 10*3/uL (ref 150–400)
RBC: 5.14 MIL/uL (ref 4.22–5.81)
RDW: 12.2 % (ref 11.5–15.5)
WBC: 6 10*3/uL (ref 4.0–10.5)
nRBC: 0 % (ref 0.0–0.2)

## 2020-05-03 LAB — ETHANOL: Alcohol, Ethyl (B): 69 mg/dL — ABNORMAL HIGH (ref <10)

## 2020-05-03 LAB — LIPASE, BLOOD: Lipase: 372 U/L — ABNORMAL HIGH (ref 11–51)

## 2020-05-03 LAB — SARS CORONAVIRUS 2 BY RT PCR (HOSPITAL ORDER, PERFORMED IN ~~LOC~~ HOSPITAL LAB): SARS Coronavirus 2: NEGATIVE

## 2020-05-03 MED ORDER — THIAMINE HCL 100 MG PO TABS
100.0000 mg | ORAL_TABLET | Freq: Every day | ORAL | Status: DC
  Administered 2020-05-04 – 2020-05-06 (×3): 100 mg via ORAL
  Filled 2020-05-03 (×3): qty 1

## 2020-05-03 MED ORDER — SODIUM CHLORIDE 0.9 % IV SOLN: Freq: Once | INTRAVENOUS | Status: AC

## 2020-05-03 MED ORDER — HYDROMORPHONE HCL 1 MG/ML IJ SOLN
1.0000 mg | INTRAMUSCULAR | Status: DC | PRN
  Administered 2020-05-03 – 2020-05-04 (×5): 1 mg via INTRAVENOUS
  Filled 2020-05-03 (×5): qty 1

## 2020-05-03 MED ORDER — ACETAMINOPHEN 325 MG PO TABS
650.0000 mg | ORAL_TABLET | Freq: Four times a day (QID) | ORAL | Status: DC | PRN
  Administered 2020-05-04 – 2020-05-05 (×3): 650 mg via ORAL
  Filled 2020-05-03 (×3): qty 2

## 2020-05-03 MED ORDER — ADULT MULTIVITAMIN W/MINERALS CH
1.0000 | ORAL_TABLET | Freq: Every day | ORAL | Status: DC
  Administered 2020-05-04 – 2020-05-06 (×3): 1 via ORAL
  Filled 2020-05-03 (×3): qty 1

## 2020-05-03 MED ORDER — LORAZEPAM 1 MG PO TABS
1.0000 mg | ORAL_TABLET | ORAL | Status: DC | PRN
  Administered 2020-05-04: 1 mg via ORAL
  Filled 2020-05-03: qty 1

## 2020-05-03 MED ORDER — ACETAMINOPHEN 650 MG RE SUPP: 650.0000 mg | Freq: Four times a day (QID) | RECTAL | Status: DC | PRN

## 2020-05-03 MED ORDER — ONDANSETRON HCL 4 MG/2ML IJ SOLN: 4.0000 mg | Freq: Once | INTRAMUSCULAR | Status: AC

## 2020-05-03 MED ORDER — THIAMINE HCL 100 MG/ML IJ SOLN
100.0000 mg | Freq: Every day | INTRAMUSCULAR | Status: DC
  Administered 2020-05-03: 100 mg via INTRAVENOUS
  Filled 2020-05-03: qty 2

## 2020-05-03 MED ORDER — HYDROMORPHONE HCL 1 MG/ML IJ SOLN
1.0000 mg | Freq: Once | INTRAMUSCULAR | Status: AC
  Administered 2020-05-03: 1 mg via INTRAVENOUS
  Filled 2020-05-03: qty 1

## 2020-05-03 MED ORDER — ONDANSETRON HCL 4 MG/2ML IJ SOLN
4.0000 mg | Freq: Once | INTRAMUSCULAR | Status: AC
  Administered 2020-05-03: 4 mg via INTRAVENOUS
  Filled 2020-05-03: qty 2

## 2020-05-03 MED ORDER — PROMETHAZINE HCL 25 MG/ML IJ SOLN
12.5000 mg | Freq: Four times a day (QID) | INTRAMUSCULAR | Status: DC | PRN
  Administered 2020-05-03 – 2020-05-05 (×6): 12.5 mg via INTRAVENOUS
  Filled 2020-05-03 (×6): qty 1

## 2020-05-03 MED ORDER — LORAZEPAM 2 MG/ML IJ SOLN
1.0000 mg | INTRAMUSCULAR | Status: DC | PRN
  Administered 2020-05-03: 1 mg via INTRAVENOUS
  Filled 2020-05-03: qty 1

## 2020-05-03 MED ORDER — DIPHENHYDRAMINE HCL 50 MG/ML IJ SOLN
12.5000 mg | Freq: Four times a day (QID) | INTRAMUSCULAR | Status: DC | PRN
  Administered 2020-05-03 – 2020-05-06 (×9): 12.5 mg via INTRAVENOUS
  Filled 2020-05-03 (×9): qty 1

## 2020-05-03 MED ORDER — SODIUM CHLORIDE 0.9 % IV BOLUS
1000.0000 mL | Freq: Once | INTRAVENOUS | Status: AC
  Administered 2020-05-03: 1000 mL via INTRAVENOUS

## 2020-05-03 MED ORDER — ONDANSETRON HCL 4 MG/2ML IJ SOLN
INTRAMUSCULAR | Status: AC
  Administered 2020-05-03: 4 mg via INTRAVENOUS
  Filled 2020-05-03: qty 2

## 2020-05-03 MED ORDER — ONDANSETRON HCL 4 MG/2ML IJ SOLN
4.0000 mg | Freq: Four times a day (QID) | INTRAMUSCULAR | Status: DC | PRN
  Administered 2020-05-04: 4 mg via INTRAVENOUS
  Filled 2020-05-03 (×2): qty 2

## 2020-05-03 MED ORDER — ONDANSETRON HCL 4 MG PO TABS
4.0000 mg | ORAL_TABLET | Freq: Four times a day (QID) | ORAL | Status: DC | PRN
  Administered 2020-05-05 – 2020-05-06 (×2): 4 mg via ORAL
  Filled 2020-05-03 (×3): qty 1

## 2020-05-03 MED ORDER — POTASSIUM CHLORIDE IN NACL 40-0.9 MEQ/L-% IV SOLN
INTRAVENOUS | Status: AC
  Filled 2020-05-03 (×2): qty 1000

## 2020-05-03 MED ORDER — FOLIC ACID 1 MG PO TABS
1.0000 mg | ORAL_TABLET | Freq: Every day | ORAL | Status: DC
  Administered 2020-05-04 – 2020-05-06 (×3): 1 mg via ORAL
  Filled 2020-05-03 (×3): qty 1

## 2020-05-03 NOTE — ED Notes (Signed)
Pt transported to xray 

## 2020-05-03 NOTE — ED Provider Notes (Signed)
ER Provider Note       Time seen: 5:20 PM    I have reviewed the vital signs and the nursing notes.  HISTORY   Chief Complaint No chief complaint on file.   HPI Bruce [name] is a 49 y.o. male with a history of atrial fibrillation, hepatitis C, polysubstance abuse, scrotal mass who presents today for severe upper abdominal pain.  Patient has a history of pancreatitis, states he drank Pepsi 5 hours ago and that is when the pain started.  Patient arrives with vomiting, complaining of 10 out of 10 upper abdominal pain.  Past Medical History:  Diagnosis Date  . A-fib ([place])   . Dental caries   . Hepatitis C infection   . Iritis   . PAF (paroxysmal atrial fibrillation) ([place])    a. diagnosed 03/19/18; b. CHADS2VASc 0  . Polysubstance abuse ([place])    a. cocaine, tobacco, etoh  . Scrotal mass   . Tick borne fever   . Uveitis     Past Surgical History:  Procedure Laterality Date  . FINGER SURGERY     infection, middle left  . fractured jaw    . SHOULDER SURGERY     right    Allergies Penicillins  Review of Systems Constitutional: Negative for fever. Cardiovascular: Negative for chest pain. Respiratory: Negative for shortness of breath. Gastrointestinal: Positive for abdominal pain, vomiting Musculoskeletal: Negative for back pain. Skin: Negative for rash. Neurological: Negative for headaches, focal weakness or numbness.  All systems negative/normal/unremarkable except as stated in the HPI  ____________________________________________   PHYSICAL EXAM:  VITAL SIGNS: Vitals:   05/03/20 1701  BP: (!) 148/88  Pulse: 97  Resp: 20  Temp: 98.7 F (37.1 C)  SpO2: 99%    Constitutional: Alert and oriented.  Moderate distress Eyes: Conjunctivae are normal. Normal extraocular movements. Cardiovascular: Normal rate, regular rhythm. No murmurs, rubs, or gallops. Respiratory: Normal respiratory effort without tachypnea nor retractions. Breath sounds are clear and  equal bilaterally. No wheezes/rales/rhonchi. Gastrointestinal: Severe epigastric tenderness with possible guarding Musculoskeletal: Nontender with normal range of motion in extremities. No lower extremity tenderness nor edema. Neurologic:  Normal speech and language. No gross focal neurologic deficits are appreciated.  Skin:  Skin is warm, dry and intact. No rash noted. Psychiatric: Speech and behavior are normal.  ____________________________________________   LABS (pertinent positives/negatives)  Labs Reviewed  CBC WITH DIFFERENTIAL/PLATELET - Abnormal; Notable for the following components:      Result Value   MCHC 37.0 (*)    All other components within normal limits  COMPREHENSIVE METABOLIC PANEL - Abnormal; Notable for the following components:   CO2 21 (*)    Glucose, Bld 112 (*)    Total Protein 8.5 (*)    AST 63 (*)    ALT 45 (*)    Anion gap 16 (*)    All other components within normal limits  LIPASE, BLOOD - Abnormal; Notable for the following components:   Lipase 372 (*)    All other components within normal limits  ETHANOL - Abnormal; Notable for the following components:   Alcohol, Ethyl (B) 69 (*)    All other components within normal limits  URINALYSIS, COMPLETE (UACMP) WITH MICROSCOPIC  URINE DRUG SCREEN, QUALITATIVE ([facility] ONLY)   RADIOLOGY  Images were viewed by me Abdomen 2 view IMPRESSION: Negative.  DIFFERENTIAL DIAGNOSIS  Pancreatitis, gastritis, alcohol use disorder, dehydration, electrolyte abnormality  ASSESSMENT AND PLAN  Alcohol induced pancreatitis   Plan: The patient had presented for  severe abdominal pain. Patient's labs did reveal likely alcohol induced pancreatitis.  Pain was unrelieved after fluids and IV Dilaudid with antiemetics.  Patient states he has not been able to tolerate anything by mouth.  I will discuss with the hospitalist for admission.  Daryel November MD    Note: This note was generated in part or whole with voice  recognition software. Voice recognition is usually quite accurate but there are transcription errors that can and very often do occur. I apologize for any typographical errors that were not detected and corrected.     Emily Filbert, MD 05/03/20 [place] [place]

## 2020-05-03 NOTE — ED Triage Notes (Signed)
Pt to the er for severe abd pain. Pt has a hx of pancreatitis. Pt states he drank a pepsi 5 hours ago and that's when the pain started, PT is shaking and is having difficulties concentrating. Pt vomiting during triage.

## 2020-05-03 NOTE — H&P (Addendum)
History and Physical    Bruce [name] BLT:[id number] DOB: 06/03/1971 DOA: 05/03/2020  PCP: Patient, No Pcp Per  Patient coming from: Home  I have personally briefly reviewed patient's old medical records in [place]  Chief Complaint: Abdominal pain  HPI: Bruce [name] is a 49 y.o. male with medical history significant for polysubstance use with history of alcohol induced pancreatitis, paroxysmal atrial fibrillation (not on anticoagulation due to chads vas score of 0), and hepatitis C who presents to the ED for evaluation of abdominal pain.  Patient states he developed severe epigastric abdominal pain earlier this morning which goes across his upper abdomen.  He denies any radiation of pain to his back.  He has had frequent nausea and vomiting and not appetite maintain any adequate oral intake.  He reports several bowel movements of varying consistency but no overt diarrhea.  He denies any subjective fevers, chills, diaphoresis, chest pain, or dyspnea.  He does report alcohol use but does not quantify specific amount.  He says he has not had any alcohol today.  He reports some tobacco and marijuana use.  ED Course:  Initial vitals showed BP 148/88, pulse 97, RR 20, temp 98.7 Fahrenheit, SPO2 99% on room air.  Labs are notable for WBC 6.0, hemoglobin 16.6, platelets 226,000, sodium 140, potassium 3.7, bicarb 21, BUN 8, creatinine 0.9, AST 63, ALT 45, alk phos 83, total bilirubin 1.1, lipase 372, serum ethanol 69.  SARS-CoV-2 PCR is ordered and pending.  Abdominal x-ray is negative for acute abnormality.  Patient was given 1 L normal saline, IV Zofran, and IV Dilaudid 1 mg x 2.  The hospitalist service was consulted to admit for further evaluation and management  Review of Systems: All systems reviewed and are negative except as documented in history of present illness above.   Past Medical History:  Diagnosis Date   A-fib [facility])    Dental caries    Hepatitis C infection     Iritis    PAF (paroxysmal atrial fibrillation) ([place])    a. diagnosed 03/19/18; b. CHADS2VASc 0   Polysubstance abuse ([place])    a. cocaine, tobacco, etoh   Scrotal mass    Tick borne fever    Uveitis     Past Surgical History:  Procedure Laterality Date   FINGER SURGERY     infection, middle left   fractured jaw     SHOULDER SURGERY     right    Social History:  reports that he has been smoking cigarettes. He has been smoking about 0.25 packs per day. He has never used smokeless tobacco. He reports current alcohol use. He reports current drug use. Drugs: IV and Cocaine.  Allergies  Allergen Reactions   Penicillins Other (See Comments)    Did it involve swelling of the face/tongue/throat, SOB, or low BP? No Did it involve sudden or severe rash/hives, skin peeling, or any reaction on the inside of your mouth or nose? No Did you need to seek medical attention at a hospital or doctor's office? No When did it last happen? If all above answers are NO, may proceed with cephalosporin use.    Family History  Problem Relation Age of Onset   Drug abuse Father      Prior to Admission medications   Medication Sig Start Date End Date Taking? Authorizing Provider  acetaminophen (TYLENOL) 500 MG tablet Take 500-1,000 mg by mouth every 6 (six) hours as needed for mild pain or fever.    [provider]  amLODipine (NORVASC) 5 MG tablet Take 1 tablet (5 mg total) by mouth daily. 03/16/20   Nicole Kindred A, DO  ondansetron (ZOFRAN) 8 MG tablet Take 1 tablet (8 mg total) by mouth every 8 (eight) hours as needed for nausea or vomiting. 03/15/20   Ezekiel Slocumb, DO  tamsulosin (FLOMAX) 0.4 MG CAPS capsule Take 1 capsule (0.4 mg total) by mouth daily after breakfast. 03/16/20   Ezekiel Slocumb, DO    Physical Exam: Vitals:   05/03/20 1701 05/03/20 1913  BP: (!) 148/88 137/84  Pulse: 97 82  Resp: 20 20  Temp: 98.7 F (37.1 C)   TempSrc: Oral   SpO2: 99% 98%    Weight: 74.8 kg   Height: 5' 7"  (1.702 m)    Constitutional: Resting supine in bed, appears uncomfortable with frequent dry heaves/clear emesis Eyes: PERRL, lids and conjunctivae normal ENMT: Mucous membranes are dry. Posterior pharynx clear of any exudate or lesions.Normal dentition.  Neck: normal, supple, no masses. Respiratory: clear to auscultation bilaterally, no wheezing, no crackles. Normal respiratory effort. No accessory muscle use.  Cardiovascular: Regular rate and rhythm, no murmurs / rubs / gallops. No extremity edema. 2+ pedal pulses. Abdomen: Significantly tender to light palpation throughout the abdomen, bowel sounds diminished. Musculoskeletal: no clubbing / cyanosis. No joint deformity upper and lower extremities. Good ROM, no contractures. Normal muscle tone.  Skin: no rashes, lesions, ulcers. No induration Neurologic: CN 2-12 grossly intact. Sensation intact,  Strength 5/5 in all 4.  Psychiatric: Alert and oriented x 3.  Appears uncomfortable due to pain.    Labs on Admission: I have personally reviewed following labs and imaging studies  CBC: Recent Labs  Lab 05/03/20 1731  WBC 6.0  NEUTROABS 3.8  HGB 16.6  HCT 44.9  MCV 87.4  PLT 168   Basic Metabolic Panel: Recent Labs  Lab 05/03/20 1731  NA 140  K 3.7  CL 103  CO2 21*  GLUCOSE 112*  BUN 8  CREATININE 0.90  CALCIUM 9.6   GFR: Estimated Creatinine Clearance: 90.8 mL/min (by C-G formula based on SCr of 0.9 mg/dL). Liver Function Tests: Recent Labs  Lab 05/03/20 1731  AST 63*  ALT 45*  ALKPHOS 83  BILITOT 1.1  PROT 8.5*  ALBUMIN 4.3   Recent Labs  Lab 05/03/20 1731  LIPASE 372*   No results for input(s): AMMONIA in the last 168 hours. Coagulation Profile: No results for input(s): INR, PROTIME in the last 168 hours. Cardiac Enzymes: No results for input(s): CKTOTAL, CKMB, CKMBINDEX, TROPONINI in the last 168 hours. BNP (last 3 results) No results for input(s): PROBNP in the last  8760 hours. HbA1C: No results for input(s): HGBA1C in the last 72 hours. CBG: No results for input(s): GLUCAP in the last 168 hours. Lipid Profile: No results for input(s): CHOL, HDL, LDLCALC, TRIG, CHOLHDL, LDLDIRECT in the last 72 hours. Thyroid Function Tests: No results for input(s): TSH, T4TOTAL, FREET4, T3FREE, THYROIDAB in the last 72 hours. Anemia Panel: No results for input(s): VITAMINB12, FOLATE, FERRITIN, TIBC, IRON, RETICCTPCT in the last 72 hours. Urine analysis:    Component Value Date/Time   COLORURINE YELLOW (A) 05/03/2020 1731   APPEARANCEUR CLEAR (A) 05/03/2020 1731   LABSPEC 1.018 05/03/2020 1731   PHURINE 8.0 05/03/2020 1731   GLUCOSEU NEGATIVE 05/03/2020 1731   HGBUR NEGATIVE 05/03/2020 1731   BILIRUBINUR NEGATIVE 05/03/2020 1731   KETONESUR 5 (A) 05/03/2020 1731   PROTEINUR NEGATIVE 05/03/2020 1731   UROBILINOGEN  1.0 05/01/2012 0835   NITRITE NEGATIVE 05/03/2020 1731   LEUKOCYTESUR NEGATIVE 05/03/2020 1731    Radiological Exams on Admission: DG Abd 2 Views  Result Date: 05/03/2020 CLINICAL DATA:  Abdominal pain EXAM: ABDOMEN - 2 VIEW COMPARISON:  None. FINDINGS: The bowel gas pattern is normal. There is no evidence of free air. No radio-opaque calculi or other significant radiographic abnormality is seen. IMPRESSION: Negative. Electronically Signed   By: Rolm Baptise M.D.   On: 05/03/2020 18:20    EKG: Independently reviewed. Normal sinus rhythm without acute ischemic changes.  Not significantly changed when compared to prior.  Assessment/Plan Principal Problem:   Alcohol induced acute pancreatitis without necrosis or infection Active Problems:   AF (paroxysmal atrial fibrillation) (HCC)   Polysubstance abuse (HCC)  Bruce [name] is a 49 y.o. male with medical history significant for polysubstance use with history of alcohol induced pancreatitis, paroxysmal atrial fibrillation (not on anticoagulation due to chads vas score of 0), and hepatitis C who is  admitted with acute pancreatitis.  Alcohol induced acute pancreatitis: Lipase 372 with serum ethanol level 69.  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  He is having continued significant abdominal pain and nausea/vomiting and unable to maintain any adequate oral intake.  Will continue supportive care. -Continue pain control with as needed IV Dilaudid with hold parameters -Continue antiemetics with IV Zofran and Phenergan -Give additional 1 L normal saline bolus followed by maintenance fluids overnight -Keep n.p.o. -Obtain RUQ ultrasound  Paroxysmal atrial fibrillation: Currently in sinus rhythm.  Not on anticoagulation as his CHA2DS2-VASc score is 0.  Not requiring rate controlling agents.  Polysubstance use: Here with likely alcohol induced pancreatitis as above.  UDS is positive for cocaine. -At risk for alcohol withdrawal, will place on CIWA protocol with as needed Ativan -TOC consult requested  History of thrombocytopenia: Patient noted to have significant thrombocytopenia on prior admission in August 2020 with low platelet count of 18,000.  There was concern for heparin-induced thrombocytopenia at that time.  Heparin-induced platelet antibody was not suggestive of HIT (0.075 OD on 06/25/2019).  Platelet count currently stable, will place on SCDs for now.  DVT prophylaxis: SCDs Code Status: Full code, confirmed with patient Family Communication: Discussed with patient's significant other at bedside Disposition Plan: From home and likely discharge home pending symptomatic improvement. Consults called: None Admission status:  Status is: Observation  The patient remains OBS appropriate and will d/c before 2 midnights.  Dispo: The patient is from: Home              Anticipated d/c is to: Home              Anticipated d/c date is: 1 day pending symptomatic improvement and ability maintain adequate oral intake.              Patient currently is not medically stable to  d/c.  Zada Finders MD [facility]  If 7PM-7AM, please contact night-coverage [email or web address]  05/03/2020, 7:50 PM

## 2020-05-04 ENCOUNTER — Encounter: Payer: Self-pay | Admitting: Internal Medicine

## 2020-05-04 LAB — COMPREHENSIVE METABOLIC PANEL
ALT: 37 U/L (ref 0–44)
AST: 52 U/L — ABNORMAL HIGH (ref 15–41)
Albumin: 4.1 g/dL (ref 3.5–5.0)
Alkaline Phosphatase: 78 U/L (ref 38–126)
Anion gap: 13 (ref 5–15)
BUN: 8 mg/dL (ref 6–20)
CO2: 26 mmol/L (ref 22–32)
Calcium: 8.9 mg/dL (ref 8.9–10.3)
Chloride: 106 mmol/L (ref 98–111)
Creatinine, Ser: 0.79 mg/dL (ref 0.61–1.24)
GFR calc Af Amer: >60 mL/min (ref >60)
GFR calc non Af Amer: >60 mL/min (ref >60)
Glucose, Bld: 100 mg/dL — ABNORMAL HIGH (ref 70–99)
Potassium: 4 mmol/L (ref 3.5–5.1)
Sodium: 145 mmol/L (ref 135–145)
Total Bilirubin: 1.2 mg/dL (ref 0.3–1.2)
Total Protein: 8.1 g/dL (ref 6.5–8.1)

## 2020-05-04 LAB — CBC
HCT: 43.5 % (ref 39.0–52.0)
Hemoglobin: 15.4 g/dL (ref 13.0–17.0)
MCH: 32.2 pg (ref 26.0–34.0)
MCHC: 35.4 g/dL (ref 30.0–36.0)
MCV: 91 fL (ref 80.0–100.0)
Platelets: 193 10*3/uL (ref 150–400)
RBC: 4.78 MIL/uL (ref 4.22–5.81)
RDW: 12.4 % (ref 11.5–15.5)
WBC: 4.5 10*3/uL (ref 4.0–10.5)
nRBC: 0 % (ref 0.0–0.2)

## 2020-05-04 LAB — LIPASE, BLOOD: Lipase: 252 U/L — ABNORMAL HIGH (ref 11–51)

## 2020-05-04 LAB — MAGNESIUM: Magnesium: 1.7 mg/dL (ref 1.7–2.4)

## 2020-05-04 MED ORDER — TAMSULOSIN HCL 0.4 MG PO CAPS
0.4000 mg | ORAL_CAPSULE | Freq: Every day | ORAL | Status: DC
  Administered 2020-05-04 – 2020-05-06 (×3): 0.4 mg via ORAL
  Filled 2020-05-04 (×3): qty 1

## 2020-05-04 MED ORDER — SODIUM CHLORIDE 0.9 % IV BOLUS
1000.0000 mL | Freq: Once | INTRAVENOUS | Status: AC
  Administered 2020-05-04: 1000 mL via INTRAVENOUS

## 2020-05-04 MED ORDER — AMLODIPINE BESYLATE 5 MG PO TABS
5.0000 mg | ORAL_TABLET | Freq: Every day | ORAL | Status: DC
  Administered 2020-05-04 – 2020-05-06 (×3): 5 mg via ORAL
  Filled 2020-05-04 (×3): qty 1

## 2020-05-04 MED ORDER — HYDROCORTISONE 1 % EX CREA
1.0000 "application " | TOPICAL_CREAM | Freq: Three times a day (TID) | CUTANEOUS | Status: DC | PRN
  Administered 2020-05-04: 1 via TOPICAL
  Filled 2020-05-04: qty 28

## 2020-05-04 MED ORDER — POTASSIUM CHLORIDE IN NACL 40-0.9 MEQ/L-% IV SOLN
INTRAVENOUS | Status: DC
  Filled 2020-05-04 (×7): qty 1000

## 2020-05-04 MED ORDER — KETOROLAC TROMETHAMINE 15 MG/ML IJ SOLN
15.0000 mg | Freq: Four times a day (QID) | INTRAMUSCULAR | Status: AC | PRN
  Administered 2020-05-04 – 2020-05-05 (×5): 15 mg via INTRAVENOUS
  Filled 2020-05-04 (×5): qty 1

## 2020-05-04 NOTE — Plan of Care (Signed)
Received pt alert and oriented x 4 verbal c/o pain, itching and nausea this AM. Pain is most concern for pt. Diluadid was d/c'd today and toradol started advised pt to inform if pain not controlled. Remains on room air with no distress noted. Ns with of k+ running at 100 ml/hr continue current plan and continue to monitor

## 2020-05-04 NOTE — Progress Notes (Signed)
PROGRESS NOTE    Bruce [name]  VQM:[id number] DOB: 1971-09-26 DOA: 05/03/2020 PCP: Patient, No Pcp Per    Assessment & Plan:   Principal Problem:   Alcohol induced acute pancreatitis without necrosis or infection Active Problems:   AF (paroxysmal atrial fibrillation) (HCC)   Polysubstance abuse (HCC)    Bruce [name] is a 49 y.o. male with medical history significant for polysubstance use with history of alcohol induced pancreatitis, paroxysmal atrial fibrillation (not on anticoagulation due to chads vas score of 0), and hepatitis C who presents to the ED for evaluation of abdominal pain.   Alcohol induced acute pancreatitis Hx of recurrent pancreatitis Lipase 372 with serum ethanol level 69.  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  He is having continued significant abdominal pain and nausea/vomiting and unable to maintain any adequate oral intake.   --1L NS on presentation f/b MIVF overnight PLAN: --another liter of NS --continue MIVF@100  --clear liquid --d/c IV dilaudid --IV toradol PRN for pain -Continue antiemetics with IV Zofran and Phenergan  Paroxysmal atrial fibrillation: Currently in sinus rhythm.  Not on anticoagulation as his CHA2DS2-VASc score is 0.  Not requiring rate controlling agents.  Polysubstance use: Here with likely alcohol induced pancreatitis as above.  UDS is positive for cocaine. -At risk for alcohol withdrawal, will place on CIWA protocol with as needed Ativan -TOC consult requested  History of thrombocytopenia: Patient noted to have significant thrombocytopenia on prior admission in August 2020 with low platelet count of 18,000.  There was concern for heparin-induced thrombocytopenia at that time.  Heparin-induced platelet antibody was not suggestive of HIT (0.075 OD on 06/25/2019).  Platelet count currently stable, will place on SCDs for now.   DVT prophylaxis: SCD/Compression stockings Code Status: Full code  Family  Communication:  Status is: change to inpatient Dispo:   The patient is from: home Anticipated d/c is to: home Anticipated d/c date is: 1-2 days Patient currently is not medically stable to d/c due to: N/V and abdominal pain with oral intake, on IVF for acute pancreatitis    Subjective and Interval History:  Pt continued to have LUQ abdominal pain.  No more diarrhea.  No fever, dyspnea, chest pain, dysuria.    Objective: Vitals:   05/03/20 2100 05/04/20 0102 05/04/20 0914 05/04/20 1215  BP:  (!) 149/91 (!) 155/96 130/77  Pulse:  (!) 57 69 (!) 59  Resp:  18 14 12   Temp:  97.7 F (36.5 C) 98.1 F (36.7 C) 98 F (36.7 C)  TempSrc:  Oral Oral Oral  SpO2:  99% 98% 100%  Weight: 70.2 kg     Height: 5\' 7"  (1.702 m)       Intake/Output Summary (Last 24 hours) at 05/04/2020 1922 Last data filed at 05/04/2020 1849 Gross per 24 hour  Intake 1490.92 ml  Output 1725 ml  Net -234.08 ml   Filed Weights   05/03/20 1701 05/03/20 2100  Weight: 74.8 kg 70.2 kg    Examination:   Constitutional: NAD, AAOx3 HEENT: conjunctivae and lids normal, EOMI CV: RRR no M,R,G. Distal pulses +2.  No cyanosis.   RESP: CTA B/L, normal respiratory effort  GI: +BS, ND, tender to palpation over left abdomen Extremities: No effusions, edema, or tenderness in BLE SKIN: warm, dry and intact Neuro: II - XII grossly intact.  Sensation intact Psych: anxious mood and affect.     Data Reviewed: I have personally reviewed following labs and imaging studies  CBC: Recent Labs  Lab 05/03/20 1731 05/04/20  0536  WBC 6.0 4.5  NEUTROABS 3.8  --   HGB 16.6 15.4  HCT 44.9 43.5  MCV 87.4 91.0  PLT 226 563   Basic Metabolic Panel: Recent Labs  Lab 05/03/20 1731 05/04/20 0536  NA 140 145  K 3.7 4.0  CL 103 106  CO2 21* 26  GLUCOSE 112* 100*  BUN 8 8  CREATININE 0.90 0.79  CALCIUM 9.6 8.9  MG  --  1.7   GFR: Estimated Creatinine Clearance: 102.1 mL/min (by C-G formula based on SCr of 0.79  mg/dL). Liver Function Tests: Recent Labs  Lab 05/03/20 1731 05/04/20 0536  AST 63* 52*  ALT 45* 37  ALKPHOS 83 78  BILITOT 1.1 1.2  PROT 8.5* 8.1  ALBUMIN 4.3 4.1   Recent Labs  Lab 05/03/20 1731 05/04/20 0536  LIPASE 372* 252*   No results for input(s): AMMONIA in the last 168 hours. Coagulation Profile: No results for input(s): INR, PROTIME in the last 168 hours. Cardiac Enzymes: No results for input(s): CKTOTAL, CKMB, CKMBINDEX, TROPONINI in the last 168 hours. BNP (last 3 results) No results for input(s): PROBNP in the last 8760 hours. HbA1C: No results for input(s): HGBA1C in the last 72 hours. CBG: No results for input(s): GLUCAP in the last 168 hours. Lipid Profile: No results for input(s): CHOL, HDL, LDLCALC, TRIG, CHOLHDL, LDLDIRECT in the last 72 hours. Thyroid Function Tests: No results for input(s): TSH, T4TOTAL, FREET4, T3FREE, THYROIDAB in the last 72 hours. Anemia Panel: No results for input(s): VITAMINB12, FOLATE, FERRITIN, TIBC, IRON, RETICCTPCT in the last 72 hours. Sepsis Labs: No results for input(s): PROCALCITON, LATICACIDVEN in the last 168 hours.  Recent Results (from the past 240 hour(s))  SARS Coronavirus 2 by RT PCR (hospital order, performed in [facility] hospital lab) Nasopharyngeal Nasopharyngeal Swab     Status: None   Collection Time: 05/03/20  7:26 PM   Specimen: Nasopharyngeal Swab  Result Value Ref Range Status   SARS Coronavirus 2 NEGATIVE NEGATIVE Final    Comment: (NOTE) SARS-CoV-2 target nucleic acids are NOT DETECTED.  The SARS-CoV-2 RNA is generally detectable in upper and lower respiratory specimens during the acute phase of infection. The lowest concentration of SARS-CoV-2 viral copies this assay can detect is 250 copies / mL. A negative result does not preclude SARS-CoV-2 infection and should not be used as the sole basis for treatment or other patient management decisions.  A negative result may occur with improper  specimen collection / handling, submission of specimen other than nasopharyngeal swab, presence of viral mutation(s) within the areas targeted by this assay, and inadequate number of viral copies (<250 copies / mL). A negative result must be combined with clinical observations, patient history, and epidemiological information.  Fact Sheet for Patients:   StrictlyIdeas.no  Fact Sheet for Healthcare Providers: BankingDealers.co.za  This test is not yet approved or  cleared by the [place] FDA and has been authorized for detection and/or diagnosis of SARS-CoV-2 by FDA under an Emergency Use Authorization (EUA).  This EUA will remain in effect (meaning this test can be used) for the duration of the COVID-19 declaration under Section 564(b)(1) of the Act, 21 U.S.C. section 360bbb-3(b)(1), unless the authorization is terminated or revoked sooner.  Performed at [facility], [address] [place], [place] [address]       Radiology Studies: DG Abd 2 Views  Result Date: 05/03/2020 CLINICAL DATA:  Abdominal pain EXAM: ABDOMEN - 2 VIEW COMPARISON:  None. FINDINGS: The  bowel gas pattern is normal. There is no evidence of free air. No radio-opaque calculi or other significant radiographic abnormality is seen. IMPRESSION: Negative. Electronically Signed   By: Charlett Nose M.D.   On: 05/03/2020 18:20   US Abdomen Limited RUQ  Result Date: 05/03/2020 CLINICAL DATA:  Pancreatitis and abdominal pain.  Elevated lipase. EXAM: ULTRASOUND ABDOMEN LIMITED RIGHT UPPER QUADRANT COMPARISON:  CT 03/09/2020, abdominal ultrasound 01/12/2020 FINDINGS: Gallbladder: Physiologically distended. No gallstones or wall thickening visualized. No sonographic Murphy sign noted by sonographer. Common bile duct: Diameter: 2-3 mm. Liver: No focal lesion identified. Heterogeneous and mildly increased in parenchymal echogenicity. Portal vein is patent on color  Doppler imaging with normal direction of blood flow towards the liver. Other: No right upper quadrant ascites. IMPRESSION: 1. Normal sonographic appearance of the gallbladder and biliary tree. 2. Hepatic steatosis. Electronically Signed   By: Narda Rutherford M.D.   On: 05/03/2020 21:06     Scheduled Meds: . amLODipine  5 mg Oral Daily  . folic acid  1 mg Oral Daily  . multivitamin with minerals  1 tablet Oral Daily  . tamsulosin  0.4 mg Oral Daily  . thiamine  100 mg Oral Daily   Or  . thiamine  100 mg Intravenous Daily   Continuous Infusions: . 0.9 % NaCl with KCl 40 mEq / L 100 mL/hr at 05/04/20 1737     LOS: 0 days     Darlin Priestly, MD [facility] If 7PM-7AM, please contact night-coverage 05/04/2020, 7:22 PM

## 2020-05-04 NOTE — Plan of Care (Signed)

## 2020-05-05 LAB — CBC
HCT: 40.2 % (ref 39.0–52.0)
Hemoglobin: 14.3 g/dL (ref 13.0–17.0)
MCH: 32.3 pg (ref 26.0–34.0)
MCHC: 35.6 g/dL (ref 30.0–36.0)
MCV: 90.7 fL (ref 80.0–100.0)
Platelets: 156 10*3/uL (ref 150–400)
RBC: 4.43 MIL/uL (ref 4.22–5.81)
RDW: 12.1 % (ref 11.5–15.5)
WBC: 3.3 10*3/uL — ABNORMAL LOW (ref 4.0–10.5)
nRBC: 0 % (ref 0.0–0.2)

## 2020-05-05 LAB — BASIC METABOLIC PANEL
Anion gap: 8 (ref 5–15)
BUN: 7 mg/dL (ref 6–20)
CO2: 27 mmol/L (ref 22–32)
Calcium: 8.5 mg/dL — ABNORMAL LOW (ref 8.9–10.3)
Chloride: 103 mmol/L (ref 98–111)
Creatinine, Ser: 0.72 mg/dL (ref 0.61–1.24)
GFR calc Af Amer: >60 mL/min (ref >60)
GFR calc non Af Amer: >60 mL/min (ref >60)
Glucose, Bld: 123 mg/dL — ABNORMAL HIGH (ref 70–99)
Potassium: 3.7 mmol/L (ref 3.5–5.1)
Sodium: 138 mmol/L (ref 135–145)

## 2020-05-05 LAB — MAGNESIUM: Magnesium: 1.7 mg/dL (ref 1.7–2.4)

## 2020-05-05 MED ORDER — BOOST / RESOURCE BREEZE PO LIQD CUSTOM
1.0000 | Freq: Three times a day (TID) | ORAL | Status: DC
  Administered 2020-05-05 (×2): 1 via ORAL

## 2020-05-05 MED ORDER — MORPHINE SULFATE 15 MG PO TABS
15.0000 mg | ORAL_TABLET | Freq: Four times a day (QID) | ORAL | Status: DC | PRN
  Administered 2020-05-06 (×2): 15 mg via ORAL
  Filled 2020-05-05 (×2): qty 1

## 2020-05-05 MED ORDER — MORPHINE SULFATE 15 MG PO TABS
15.0000 mg | ORAL_TABLET | ORAL | Status: DC | PRN
  Administered 2020-05-05 (×3): 15 mg via ORAL
  Filled 2020-05-05 (×3): qty 1

## 2020-05-05 NOTE — Plan of Care (Signed)
Received pt alert and oriented x 4 verbal c/o pain and nausea this AM. Pain is most concern for pt. Despite changes in regimen pt still reports high pain scores throughout shift usually 7 or higher. Offered repositioning and environmental changes to add to comfort. Remains on room air with no distress noted. continue current plan and continue to monitor

## 2020-05-05 NOTE — Plan of Care (Signed)

## 2020-05-05 NOTE — Progress Notes (Signed)
PROGRESS NOTE    Bruce [name]  SKA:[id number] DOB: 08/03/1971 DOA: 05/03/2020 PCP: Patient, No Pcp Per    Assessment & Plan:   Principal Problem:   Alcohol induced acute pancreatitis without necrosis or infection Active Problems:   AF (paroxysmal atrial fibrillation) (HCC)   Acute pancreatitis   Polysubstance abuse (HCC)    Bruce [name] is a 49 y.o. male with medical history significant for polysubstance use with history of alcohol induced pancreatitis, paroxysmal atrial fibrillation (not on anticoagulation due to chads vas score of 0), and hepatitis C who presents to the ED for evaluation of abdominal pain.   Alcohol induced acute pancreatitis Hx of recurrent pancreatitis Lipase 372 with serum ethanol level 69.  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  He is having continued significant abdominal pain and nausea/vomiting and unable to maintain any adequate oral intake.   --s/p 2L NS f/b MIVF --Lipase trended down the next day PLAN: --continue MIVF@100  for now --advanced diet to regular, per Bruce [name] request --oral morphine PRN for pain, taper off tomorrow --No IV narcotics -Continue antiemetics with IV Zofran and Phenergan  Paroxysmal atrial fibrillation: Currently in sinus rhythm.  Not on anticoagulation as his CHA2DS2-VASc score is 0.  Not requiring rate controlling agents.  Polysubstance use: Here with likely alcohol induced pancreatitis as above.  UDS is positive for cocaine. -At risk for alcohol withdrawal, will place on CIWA protocol with as needed Ativan -TOC consult requested  History of thrombocytopenia: Patient noted to have significant thrombocytopenia on prior admission in August 2020 with low platelet count of 18,000.  There was concern for heparin-induced thrombocytopenia at that time.  Heparin-induced platelet antibody was not suggestive of HIT (0.075 OD on 06/25/2019).  Platelet count currently stable, will place on SCDs for now.   DVT  prophylaxis: SCD/Compression stockings Code Status: Full code  Family Communication:  Status is: changed to inpatient Dispo:   The patient is from: home Anticipated d/c is to: home Anticipated d/c date is: tomorrow Patient currently is not medically stable to d/c due to: advanced diet to regular today.  If tolerating diet, then can discharge tomorrow.   Subjective and Interval History:  Bruce [name] complained to the nursing about worsening abdominal pain this morning.  During rounding, Bruce [name] pointed the pain to his right upper back and right front lower rib cage.  Bruce [name] was vague about how much oral intake he was tolerating, but reported being hungry and ready for regular diet.  No fever, dyspnea, vomiting.   Objective: Vitals:   05/04/20 1936 05/04/20 2330 05/05/20 0727 05/05/20 1535  BP: 117/76 132/82 128/87 133/84  Pulse: 70 76 67 62  Resp: 17 17 17 17   Temp: 98.3 F (36.8 C) 98 F (36.7 C) 98.1 F (36.7 C) 98.3 F (36.8 C)  TempSrc: Oral  Oral Oral  SpO2: 100% 100% 99% 100%  Weight:      Height:        Intake/Output Summary (Last 24 hours) at 05/05/2020 2113 Last data filed at 05/05/2020 1906 Gross per 24 hour  Intake 2208.92 ml  Output 750 ml  Net 1458.92 ml   Filed Weights   05/03/20 1701 05/03/20 2100  Weight: 74.8 kg 70.2 kg    Examination:   Constitutional: NAD, AAOx3 HEENT: conjunctivae and lids normal, EOMI CV: RRR no M,R,G. Distal pulses +2.  No cyanosis.   RESP: CTA B/L, normal respiratory effort  GI: +BS, ND, NT, soft Extremities: No effusions, edema, or tenderness in BLE SKIN: warm, dry  and intact Neuro: II - XII grossly intact.  Sensation intact   Data Reviewed: I have personally reviewed following labs and imaging studies  CBC: Recent Labs  Lab 05/03/20 1731 05/04/20 0536 05/05/20 0341  WBC 6.0 4.5 3.3*  NEUTROABS 3.8  --   --   HGB 16.6 15.4 14.3  HCT 44.9 43.5 40.2  MCV 87.4 91.0 90.7  PLT 226 193 156   Basic Metabolic Panel: Recent Labs  Lab  05/03/20 1731 05/04/20 0536 05/05/20 0341  NA 140 145 138  K 3.7 4.0 3.7  CL 103 106 103  CO2 21* 26 27  GLUCOSE 112* 100* 123*  BUN 8 8 7   CREATININE 0.90 0.79 0.72  CALCIUM 9.6 8.9 8.5*  MG  --  1.7 1.7   GFR: Estimated Creatinine Clearance: 102.1 mL/min (by C-G formula based on SCr of 0.72 mg/dL). Liver Function Tests: Recent Labs  Lab 05/03/20 1731 05/04/20 0536  AST 63* 52*  ALT 45* 37  ALKPHOS 83 78  BILITOT 1.1 1.2  PROT 8.5* 8.1  ALBUMIN 4.3 4.1   Recent Labs  Lab 05/03/20 1731 05/04/20 0536  LIPASE 372* 252*   No results for input(s): AMMONIA in the last 168 hours. Coagulation Profile: No results for input(s): INR, PROTIME in the last 168 hours. Cardiac Enzymes: No results for input(s): CKTOTAL, CKMB, CKMBINDEX, TROPONINI in the last 168 hours. BNP (last 3 results) No results for input(s): PROBNP in the last 8760 hours. HbA1C: No results for input(s): HGBA1C in the last 72 hours. CBG: No results for input(s): GLUCAP in the last 168 hours. Lipid Profile: No results for input(s): CHOL, HDL, LDLCALC, TRIG, CHOLHDL, LDLDIRECT in the last 72 hours. Thyroid Function Tests: No results for input(s): TSH, T4TOTAL, FREET4, T3FREE, THYROIDAB in the last 72 hours. Anemia Panel: No results for input(s): VITAMINB12, FOLATE, FERRITIN, TIBC, IRON, RETICCTPCT in the last 72 hours. Sepsis Labs: No results for input(s): PROCALCITON, LATICACIDVEN in the last 168 hours.  Recent Results (from the past 240 hour(s))  SARS Coronavirus 2 by RT PCR (hospital order, performed in [facility] hospital lab) Nasopharyngeal Nasopharyngeal Swab     Status: None   Collection Time: 05/03/20  7:26 PM   Specimen: Nasopharyngeal Swab  Result Value Ref Range Status   SARS Coronavirus 2 NEGATIVE NEGATIVE Final    Comment: (NOTE) SARS-CoV-2 target nucleic acids are NOT DETECTED.  The SARS-CoV-2 RNA is generally detectable in upper and lower respiratory specimens during the acute phase  of infection. The lowest concentration of SARS-CoV-2 viral copies this assay can detect is 250 copies / mL. A negative result does not preclude SARS-CoV-2 infection and should not be used as the sole basis for treatment or other patient management decisions.  A negative result may occur with improper specimen collection / handling, submission of specimen other than nasopharyngeal swab, presence of viral mutation(s) within the areas targeted by this assay, and inadequate number of viral copies (<250 copies / mL). A negative result must be combined with clinical observations, patient history, and epidemiological information.  Fact Sheet for Patients:   05/05/20  Fact Sheet for Healthcare Providers: BoilerBrush.com.cy  This test is not yet approved or  cleared by the https://pope.com/ FDA and has been authorized for detection and/or diagnosis of SARS-CoV-2 by FDA under an Emergency Use Authorization (EUA).  This EUA will remain in effect (meaning this test can be used) for the duration of the COVID-19 declaration under Section 564(b)(1) of the Act, 21 U.S.C.  section 360bbb-3(b)(1), unless the authorization is terminated or revoked sooner.  Performed at [facility], [address] [place], [place] [address]       Radiology Studies: No results found.   Scheduled Meds:  amLODipine  5 mg Oral Daily   feeding supplement  1 Container Oral TID BM   folic acid  1 mg Oral Daily   multivitamin with minerals  1 tablet Oral Daily   tamsulosin  0.4 mg Oral Daily   thiamine  100 mg Oral Daily   Or   thiamine  100 mg Intravenous Daily   Continuous Infusions:  0.9 % NaCl with KCl 40 mEq / L Stopped (05/05/20 1857)     LOS: 1 day     Darlin Priestly, MD [facility] If 7PM-7AM, please contact night-coverage 05/05/2020, 9:13 PM

## 2020-05-05 NOTE — Progress Notes (Signed)
Initial Nutrition Assessment  DOCUMENTATION CODES:   Not applicable  INTERVENTION:  Provide Boost Breeze po TID, each supplement provides 250 kcal and 9 grams of protein.  RD provided "Pancreatitis Nutrition Therapy" handout from the Academy of Nutrition and Dietetics. Discussed role of the pancreas and how it helps digest and absorb food. Reviewed that foods high in fat are especially hard to digest and can cause pain. Encouraged intake of small, low-fat meals throughout the day to help ease pain and encouraged patient to drink adequate fluids. Provided a list of foods not recommended on a low-fat diet and discussed alternatives patient will enjoy. Teach back method used.  Continue MVI daily, folic acid 1 mg daily, thiamine 100 mg daily.  NUTRITION DIAGNOSIS:   Inadequate oral intake related to decreased appetite, acute illness (pancreatitis) as evidenced by per patient/family report.  GOAL:   Patient will meet greater than or equal to 90% of their needs  MONITOR:   PO intake, Supplement acceptance, Diet advancement, Labs, Weight trends, I & O's  REASON FOR ASSESSMENT:   Malnutrition Screening Tool    ASSESSMENT:   49 year old male with PMHx of hepatitis C, paroxysmal A-fib, polysubstance abuse (cocaine, tobacco, EtOH) admitted with recurrent alcohol induced acute pancreatitis.   Met with patient at bedside. He is known to this RD from previous admission. Patient reports his appetite is fairly good at baseline but he has been eating less than usual lately. He is unable to provide any further details. He reports he has not been able to eat well the past couple days due to abdominal pain. He reports he is tolerating clear liquid diet. Per chart patient ate 80% of his clear liquid lunch. Patient reported his diet was being advanced to regular today but currently he is still ordered for clear liquids. He is amenable to drinking an oral nutrition supplement to help meet calorie/protein  needs.  Patient reports his UBW is 165-170 lbs. According to chart patient was 77.1 kg on 04/18/2019. He is now 70.2 kg (154.76 lbs). He has lost 6.9 kg (8.9% body weight) over the past year, which is not significant for time frame.  Medications reviewed and include: folic acid 1 mg daily, MVI daily, thiamine 100 mg daily, NS with KCl 40 mEq/L at 100 mL/hr.  Labs reviewed.  Patient does not meet criteria for malnutrition at this time.  NUTRITION - FOCUSED PHYSICAL EXAM:    Most Recent Value  Orbital Region No depletion  Upper Arm Region Mild depletion  Thoracic and Lumbar Region No depletion  Buccal Region No depletion  Temple Region No depletion  Clavicle Bone Region No depletion  Clavicle and Acromion Bone Region No depletion  Scapular Bone Region No depletion  Dorsal Hand No depletion  Patellar Region No depletion  Anterior Thigh Region No depletion  Posterior Calf Region Mild depletion  Edema (RD Assessment) None  Hair Reviewed  Eyes Reviewed  Mouth Reviewed  Skin Reviewed  Nails Reviewed     Diet Order:   Diet Order            Diet clear liquid Room service appropriate? Yes; Fluid consistency: Thin  Diet effective now                EDUCATION NEEDS:   Education needs have been addressed  Skin:  Skin Assessment: Reviewed RN Assessment  Last BM:  05/03/2020  Height:   Ht Readings from Last 1 Encounters:  05/03/20 _0  (1.702 m)  Weight:   Wt Readings from Last 1 Encounters:  05/03/20 70.2 kg   BMI:  Body mass index is 24.24 kg/m.  Estimated Nutritional Needs:   Kcal:  1800-2000  Protein:  90-100 grams  Fluid:  1.8-2 L/day  Jacklynn Barnacle, MS, RD, LDN Pager number available on Amion

## 2020-05-06 DIAGNOSIS — K852 Alcohol induced acute pancreatitis without necrosis or infection: Principal | ICD-10-CM

## 2020-05-06 DIAGNOSIS — I480 Paroxysmal atrial fibrillation: Secondary | ICD-10-CM

## 2020-05-06 LAB — MAGNESIUM: Magnesium: 1.8 mg/dL (ref 1.7–2.4)

## 2020-05-06 LAB — CBC
HCT: 40.9 % (ref 39.0–52.0)
Hemoglobin: 14.5 g/dL (ref 13.0–17.0)
MCH: 31.9 pg (ref 26.0–34.0)
MCHC: 35.5 g/dL (ref 30.0–36.0)
MCV: 90.1 fL (ref 80.0–100.0)
Platelets: 167 10*3/uL (ref 150–400)
RBC: 4.54 MIL/uL (ref 4.22–5.81)
RDW: 12 % (ref 11.5–15.5)
WBC: 4.7 10*3/uL (ref 4.0–10.5)
nRBC: 0 % (ref 0.0–0.2)

## 2020-05-06 LAB — BASIC METABOLIC PANEL
Anion gap: 9 (ref 5–15)
BUN: 7 mg/dL (ref 6–20)
CO2: 27 mmol/L (ref 22–32)
Calcium: 9.2 mg/dL (ref 8.9–10.3)
Chloride: 102 mmol/L (ref 98–111)
Creatinine, Ser: 0.94 mg/dL (ref 0.61–1.24)
GFR calc Af Amer: >60 mL/min (ref >60)
GFR calc non Af Amer: >60 mL/min (ref >60)
Glucose, Bld: 112 mg/dL — ABNORMAL HIGH (ref 70–99)
Potassium: 4.1 mmol/L (ref 3.5–5.1)
Sodium: 138 mmol/L (ref 135–145)

## 2020-05-06 MED ORDER — THIAMINE HCL 100 MG PO TABS: 100.0000 mg | ORAL_TABLET | Freq: Every day | ORAL | 0 refills | Status: DC

## 2020-05-06 MED ORDER — FOLIC ACID 1 MG PO TABS: 1.0000 mg | ORAL_TABLET | Freq: Every day | ORAL | 0 refills | Status: DC

## 2020-05-06 MED ORDER — ONDANSETRON HCL 4 MG PO TABS
4.0000 mg | ORAL_TABLET | Freq: Three times a day (TID) | ORAL | 0 refills | Status: DC | PRN
Start: 2020-05-06 — End: 2020-07-02

## 2020-05-06 MED ORDER — ADULT MULTIVITAMIN W/MINERALS CH: 1.0000 | ORAL_TABLET | Freq: Every day | ORAL | Status: DC

## 2020-05-06 MED ORDER — OXYCODONE HCL 5 MG PO TABS: 5.0000 mg | ORAL_TABLET | Freq: Three times a day (TID) | ORAL | 0 refills | Status: DC | PRN

## 2020-05-06 NOTE — Discharge Instructions (Signed)

## 2020-05-06 NOTE — TOC Initial Note (Signed)
Transition of Care [facility]) - Initial/Assessment Note    Patient Details  Name: Bruce [name] MRN: [id number] Date of Birth: 1971/10/05  [facility] CM/SW Contact:    Barrie Dunker, RN Phone Number: 05/06/2020, 12:19 PM  Clinical Narrative:                  Spoke with the patient and provided him with an [facility], I explained that he will need to call them to set up the appointment for the application process and then they would set up an appointment for a PCP once completed, Sent a email referral to [facility]       Patient Goals and CMS Choice        Expected Discharge Plan and Services           Expected Discharge Date: 05/06/20                                    Prior Living Arrangements/Services                       Activities of Daily Living Home Assistive Devices/Equipment: None ADL Screening (condition at time of admission) Patient's cognitive ability adequate to safely complete daily activities?: Yes Is the patient deaf or have difficulty hearing?: No Does the patient have difficulty seeing, even when wearing glasses/contacts?: No Does the patient have difficulty concentrating, remembering, or making decisions?: Yes Patient able to express need for assistance with ADLs?: Yes Does the patient have difficulty dressing or bathing?: No Independently performs ADLs?: Yes (appropriate for developmental age) Does the patient have difficulty walking or climbing stairs?: No Weakness of Legs: None Weakness of Arms/Hands: None  Permission Sought/Granted                  Emotional Assessment              Admission diagnosis:  Pancreatitis [K85.90] Alcohol-induced acute pancreatitis without infection or necrosis [K85.20] Alcohol induced acute pancreatitis without necrosis or infection [K85.20] Acute pancreatitis [K85.90] Patient Active Problem List   Diagnosis Date Noted   Alcohol  induced acute pancreatitis without necrosis or infection 05/03/2020   Polysubstance abuse (HCC)    Hypokalemia    Alcohol abuse    Acute pancreatitis 01/12/2020   Thrombocytopenia (HCC) 06/25/2019   Elevated troponin    Pancreatitis 06/23/2019   AF (paroxysmal atrial fibrillation) (HCC) 09/13/2018   Atrial fibrillation (HCC) 03/19/2018   Abnormality of gait 02/12/2013   Mandibular fracture (HCC) 12/25/2012   Flu vaccine need 12/25/2012   Scrotal mass 05/22/2012   Dental caries 05/22/2012   Hepatitis C infection 05/16/2012   Myalgia 05/06/2012   Arthralgia 05/06/2012   Tick borne fever, likely 05/05/2012   Tinea cruris, likely 05/05/2012   Spermatocele of epididymis 05/01/2012   Bilateral varicoceles 05/01/2012   Bilateral hydrocele 05/01/2012   PCP:  Patient, No Pcp Per Pharmacy:   [facility] [address] [place], [place] - [address] [place] [address] Phone: [phone] Fax: [phone]  [facility] [address]), [place] - [address] [place]) [place] [address] Phone: [phone] Fax: [phone]  [facility] - [place], [place] - [address] [place] [address] Phone: [phone] Fax: [phone]     Social Determinants  of Health (SDOH) Interventions    Readmission Risk Interventions No flowsheet data found.

## 2020-05-06 NOTE — Discharge Summary (Signed)
Physician Discharge Summary  Bruce [name] WRU:[id number] DOB: [birth year]  PCP: Patient, No Pcp Per.  Discussed with [facility] team who will provide patient with resources [facility].  Patient to be seen in 1 week with repeat labs (CBC, CMP & lipase).  Admitted from: Home Discharged to: Home  Admit date: 05/03/2020 Discharge date: 05/06/2020  Recommendations for Outpatient Follow-up:    Follow-up Information    Family Physician (new). Schedule an appointment as soon as possible for a visit in 1 week(s).   Why: To be seen with repeat labs (CBC, CMP, lipase) Contact information: As per information provided by hospital case management at time of discharge.       [facility] [place].   Specialty: Primary Care Contact information: [place]: None Equipment/Devices: None  Discharge Condition: Improved and stable CODE STATUS: Full Diet recommendation: Heart healthy diet, low-fat and low spicy soft diet advised.  Discharge Diagnoses:  Principal Problem:   Alcohol induced acute pancreatitis without necrosis or infection Active Problems:   AF (paroxysmal atrial fibrillation) (HCC)   Acute pancreatitis   Polysubstance abuse ([place])   Brief Summary: 49 year old male with PMH of polysubstance abuse, alcohol induced pancreatitis, paroxysmal A. fib not on anticoagulation due to CHA2DS2-VASc score of 0 and hepatitis C presented to the ED for evaluation of abdominal pain.  This was severe epigastric pain going across to his upper abdomen.  No radiation of pain to the back.  Had frequent nausea and vomiting and unable to maintain adequate oral intake.  He reported several BMs of varying consistency but no overt diarrhea.  He reported ongoing alcohol use but seemed evasive today as to the amount of consumption.  Also volunteered to some tobacco and marijuana  use.  ED course:  Initial vitals showed BP 148/88, pulse 97, RR 20, temp 98.7 Fahrenheit, SPO2 99% on room air.  Labs are notable for WBC 6.0, hemoglobin 16.6, platelets 226,000, sodium 140, potassium 3.7, bicarb 21, BUN 8, creatinine 0.9, AST 63, ALT 45, alk phos 83, total bilirubin 1.1, lipase 372, serum ethanol 69.  SARS-CoV-2 PCR negative  Abdominal x-ray is negative for acute abnormality.  Patient was given 1 L normal saline, IV Zofran, and IV Dilaudid 1 mg x 2.  The hospitalist service was consulted to admit for further evaluation and management  Assessment and plan:  Alcohol induced acute pancreatitis: Lipase 372 with serum alcohol level 69.  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  He had ongoing significant abdominal pain, nausea, vomiting and was unable to maintain adequate oral intake.  He was treated supportively with IV fluids, pain controlled which required opioids, antiemetics and bowel rest.  RUQ ultrasound shows fatty liver.  Diet was advanced which she has tolerated.  On day of discharge, reports some abdominal pain but no nausea or vomiting.  Tolerating diet.  No diarrhea.  Advised as needed Tylenol for pain but insisted for something stronger if Tylenol could not control his pain.  Thereby prescribed a very short supply of OxyIR which she has used in the past.  Reviewed [place] PDMP and he last filled opioids (oxycodone/acetaminophen, 7.5/325 mg for 30 tablets / 5 days on 03/15/2020.  He was advised regarding precautions with opioid use including not driving while taking these medications and others.  He verbalized understanding.  Paroxysmal atrial fibrillation: Clinically  in sinus rhythm.  Not on telemetry today.  Not on anticoagulation due to CHA2DS2-VASc score of 0.  Not on rate control medications.  Polysubstance abuse: Here with likely alcohol induced pancreatitis as above.  UDS positive for cocaine.  No overt alcohol withdrawal.  CIWA scores have been low.   TOC was consulted for resources.  History of thrombocytopenia: Patient noted to have significant thrombocytopenia on prior admission in August 2020 with low platelet count of 18,000.  There was concern for HIT at the time.  HIT antibody was not suggestive of same.  Currently without thrombocytopenia.     Consultations:  None  Procedures:  None   Discharge Instructions  Discharge Instructions    Call MD for:  difficulty breathing, headache or visual disturbances   Complete by: As directed    Call MD for:  extreme fatigue   Complete by: As directed    Call MD for:  persistant dizziness or light-headedness   Complete by: As directed    Call MD for:  persistant nausea and vomiting   Complete by: As directed    Call MD for:  severe uncontrolled pain   Complete by: As directed    Call MD for:  temperature >100.4   Complete by: As directed    Diet - low sodium heart healthy   Complete by: As directed    Soft, low-fat and low spicy diet.   Increase activity slowly   Complete by: As directed        Medication List    TAKE these medications   acetaminophen 500 MG tablet Commonly known as: TYLENOL Take 500-1,000 mg by mouth every 6 (six) hours as needed for mild pain or fever.   amLODipine 5 MG tablet Commonly known as: NORVASC Take 1 tablet (5 mg total) by mouth daily.   folic acid 1 MG tablet Commonly known as: FOLVITE Take 1 tablet (1 mg total) by mouth daily. Start taking on: May 07, 2020   multivitamin with minerals Tabs tablet Take 1 tablet by mouth daily. Start taking on: May 07, 2020   ondansetron 4 MG tablet Commonly known as: Zofran Take 1 tablet (4 mg total) by mouth every 8 (eight) hours as needed for nausea or vomiting. What changed:   medication strength  how much to take   oxyCODONE 5 MG immediate release tablet Commonly known as: Roxicodone Take 1 tablet (5 mg total) by mouth every 8 (eight) hours as needed for moderate pain or severe pain.    tamsulosin 0.4 MG Caps capsule Commonly known as: FLOMAX Take 1 capsule (0.4 mg total) by mouth daily after breakfast.   thiamine 100 MG tablet Take 1 tablet (100 mg total) by mouth daily. Start taking on: May 07, 2020      Allergies  Allergen Reactions  . Penicillins Other (See Comments)    Did it involve swelling of the face/tongue/throat, SOB, or low BP? No Did it involve sudden or severe rash/hives, skin peeling, or any reaction on the inside of your mouth or nose? No Did you need to seek medical attention at a hospital or doctor's office? No When did it last happen? If all above answers are "NO", may proceed with cephalosporin use.      Procedures/Studies: DG Abd 2 Views  Result Date: 05/03/2020 CLINICAL DATA:  Abdominal pain EXAM: ABDOMEN - 2 VIEW COMPARISON:  None. FINDINGS: The bowel gas pattern is normal. There is no evidence of free air. No radio-opaque calculi or other  significant radiographic abnormality is seen. IMPRESSION: Negative. Electronically Signed   By: Rolm Baptise M.D.   On: 05/03/2020 18:20   US Abdomen Limited RUQ  Result Date: 05/03/2020 CLINICAL DATA:  Pancreatitis and abdominal pain.  Elevated lipase. EXAM: ULTRASOUND ABDOMEN LIMITED RIGHT UPPER QUADRANT COMPARISON:  CT 03/09/2020, abdominal ultrasound 01/12/2020 FINDINGS: Gallbladder: Physiologically distended. No gallstones or wall thickening visualized. No sonographic Murphy sign noted by sonographer. Common bile duct: Diameter: 2-3 mm. Liver: No focal lesion identified. Heterogeneous and mildly increased in parenchymal echogenicity. Portal vein is patent on color Doppler imaging with normal direction of blood flow towards the liver. Other: No right upper quadrant ascites. IMPRESSION: 1. Normal sonographic appearance of the gallbladder and biliary tree. 2. Hepatic steatosis. Electronically Signed   By: Keith Rake M.D.   On: 05/03/2020 21:06      Subjective: Tolerating regular consistency  diet without nausea or vomiting.  No diarrhea reported.  Abdominal pain better, indicates epigastric region but at times still moderate in intensity and feels that Tylenol alone will not control his pain upon discharge.  Also asking for nausea medicines at discharge.  As per RN, no acute issues noted.  Discharge Exam:  Vitals:   05/05/20 0727 05/05/20 1535 05/05/20 2336 05/06/20 0725  BP: 128/87 133/84 (!) 152/92 (!) 150/98  Pulse: 67 62 75 64  Resp: _0 Temp: 98.1 F (36.7 C) 98.3 F (36.8 C) 98 F (36.7 C) 98.2 F (36.8 C)  TempSrc: Oral Oral  Oral  SpO2: 99% 100% 100% 100%  Weight:      Height:        General: Pleasant young male, moderately built and nourished lying comfortably propped up in bed without distress. Cardiovascular: S1 & S2 heard, RRR, S1/S2 +. No murmurs, rubs, gallops or clicks. No JVD or pedal edema. Respiratory: Clear to auscultation without wheezing, rhonchi or crackles. No increased work of breathing. Abdominal:  Non distended, & soft.  Minimal epigastric tenderness without rigidity, guarding or rebound.  No organomegaly or masses appreciated. Normal bowel sounds heard. CNS: Alert and oriented. No focal deficits. Extremities: no edema, no cyanosis    The results of significant diagnostics from this hospitalization (including imaging, microbiology, ancillary and laboratory) are listed below for reference.     Microbiology: Recent Results (from the past 240 hour(s))  SARS Coronavirus 2 by RT PCR (hospital order, performed in [facility] hospital lab) Nasopharyngeal Nasopharyngeal Swab     Status: None   Collection Time: 05/03/20  7:26 PM   Specimen: Nasopharyngeal Swab  Result Value Ref Range Status   SARS Coronavirus 2 NEGATIVE NEGATIVE Final    Comment: (NOTE) SARS-CoV-2 target nucleic acids are NOT DETECTED.  The SARS-CoV-2 RNA is generally detectable in upper and lower respiratory specimens during the acute phase of infection. The  lowest concentration of SARS-CoV-2 viral copies this assay can detect is 250 copies / mL. A negative result does not preclude SARS-CoV-2 infection and should not be used as the sole basis for treatment or other patient management decisions.  A negative result may occur with improper specimen collection / handling, submission of specimen other than nasopharyngeal swab, presence of viral mutation(s) within the areas targeted by this assay, and inadequate number of viral copies (<250 copies / mL). A negative result must be combined with clinical observations, patient history, and epidemiological information.  Fact Sheet for Patients:   StrictlyIdeas.no  Fact Sheet for Healthcare Providers: BankingDealers.co.za  This test is not yet  approved or  cleared by the [place] and has been authorized for detection and/or diagnosis of SARS-CoV-2 by FDA under an Emergency Use Authorization (EUA).  This EUA will remain in effect (meaning this test can be used) for the duration of the COVID-19 declaration under Section 564(b)(1) of the Act, 21 U.S.C. section 360bbb-3(b)(1), unless the authorization is terminated or revoked sooner.  Performed at [facility], [address] [place], [place] [address]      Labs: CBC: Recent Labs  Lab 05/03/20 1731 05/04/20 0536 05/05/20 0341 05/06/20 0349  WBC 6.0 4.5 3.3* 4.7  NEUTROABS 3.8  --   --   --   HGB 16.6 15.4 14.3 14.5  HCT 44.9 43.5 40.2 40.9  MCV 87.4 91.0 90.7 90.1  PLT 226 193 156 574    Basic Metabolic Panel: Recent Labs  Lab 05/03/20 1731 05/04/20 0536 05/05/20 0341 05/06/20 0349  NA 140 145 138 138  K 3.7 4.0 3.7 4.1  CL 103 106 103 102  CO2 21* _0 GLUCOSE 112* 100* 123* 112*  BUN _1 CREATININE 0.90 0.79 0.72 0.94  CALCIUM 9.6 8.9 8.5* 9.2  MG  --  1.7 1.7 1.8    Liver Function Tests: Recent Labs  Lab 05/03/20 1731 05/04/20 0536  AST  63* 52*  ALT 45* 37  ALKPHOS 83 78  BILITOT 1.1 1.2  PROT 8.5* 8.1  ALBUMIN 4.3 4.1    Urinalysis    Component Value Date/Time   COLORURINE YELLOW (A) 05/03/2020 1731   APPEARANCEUR CLEAR (A) 05/03/2020 1731   LABSPEC 1.018 05/03/2020 1731   PHURINE 8.0 05/03/2020 1731   GLUCOSEU NEGATIVE 05/03/2020 1731   HGBUR NEGATIVE 05/03/2020 1731   BILIRUBINUR NEGATIVE 05/03/2020 1731   KETONESUR 5 (A) 05/03/2020 1731   PROTEINUR NEGATIVE 05/03/2020 1731   UROBILINOGEN 1.0 05/01/2012 0835   NITRITE NEGATIVE 05/03/2020 1731   LEUKOCYTESUR NEGATIVE 05/03/2020 1731      Time coordinating discharge: 25 minutes  SIGNED:  Vernell Leep, MD, FACP, [facility]. [facility]  To contact the attending provider between 7A-7P or the covering provider during after hours 7P-7A, please log into the web site [email or web address] and access using universal [place] password for that web site. If you do not have the password, please call the hospital operator.

## 2020-05-07 ENCOUNTER — Telehealth: Payer: Self-pay | Admitting: Gerontology

## 2020-05-07 ENCOUNTER — Telehealth: Payer: Self-pay | Admitting: General Practice

## 2020-05-07 NOTE — Telephone Encounter (Addendum)
Pt did not answer and mailbox was full ----- Message from Benjamin Stain, CMA sent at 05/07/2020 10:56 AM EDT ----- Regarding: [facility] referral Referral from [facility]. Please call patient and explain [facility] and eligibility process

## 2020-05-07 NOTE — Telephone Encounter (Signed)
Spoke with individual's care take regarding application given at [facility].

## 2020-05-07 NOTE — Telephone Encounter (Deleted)
-----   Message from Benjamin Stain, [place] sent at 05/07/2020 10:56 AM EDT ----- Regarding: [facility] referral Referral from [facility]. Please call patient and explain [facility] and eligibility process

## 2020-05-08 IMAGING — CR DG CHEST 2V
2 series · 3 of 3 positions shown · non-contrast
Comparison: Chest radiograph 03/13/2020

CLINICAL DATA: Chest pain. Additional history provided: Left-sided
chest pain and lower abdominal pain since yesterday.

EXAM:
CHEST - 2 VIEW

[Series 1: chest pa · 0.14mm/px · 2 of 2 slices shown]
[im 1/2]
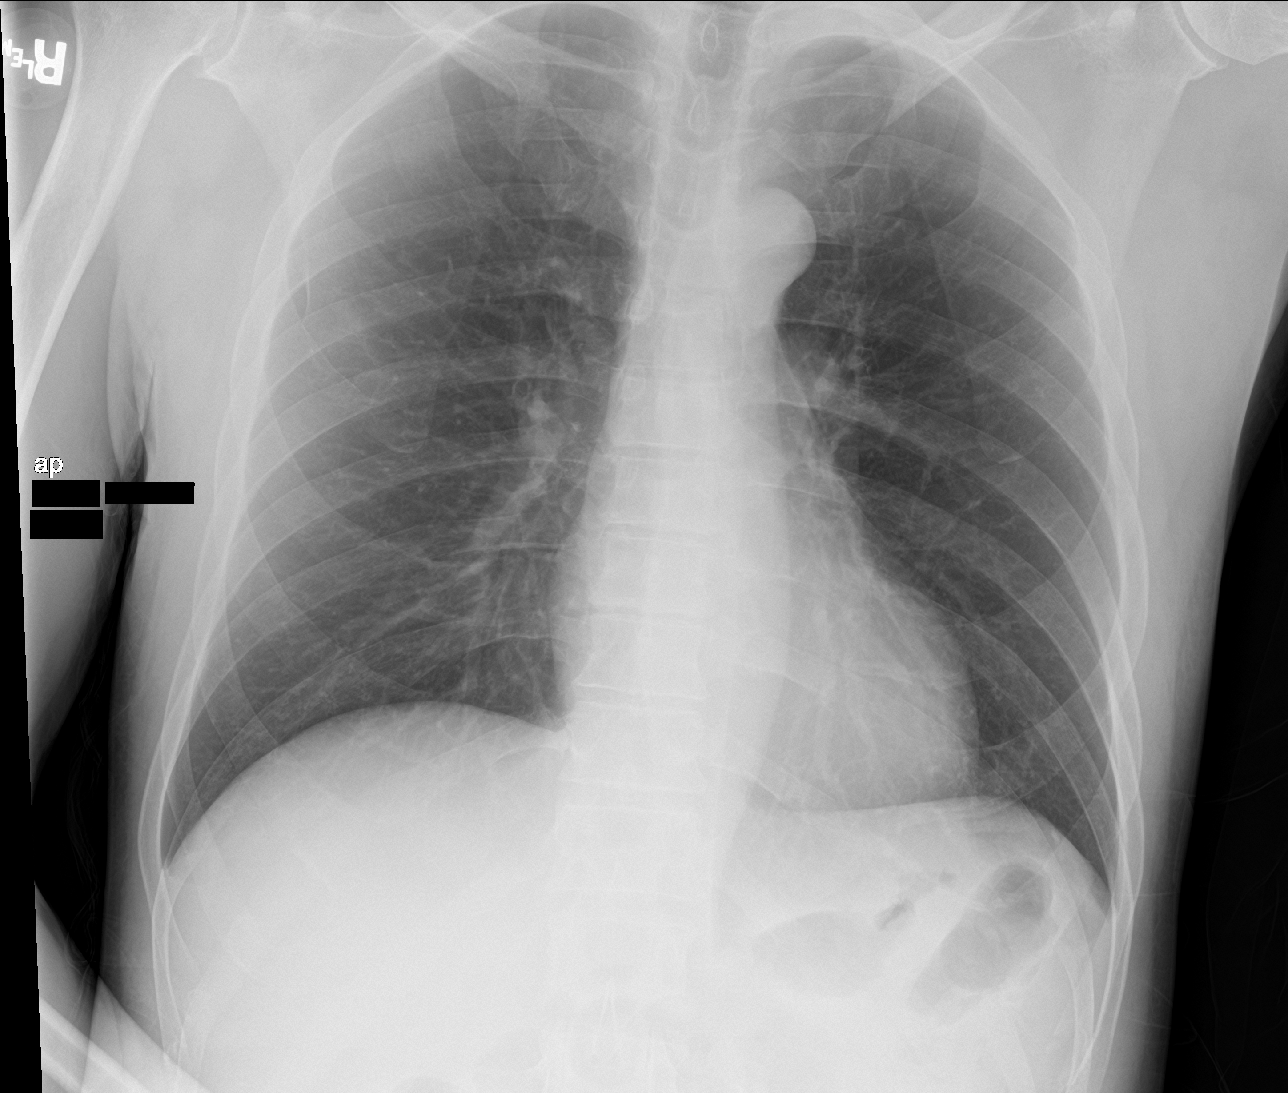
[im 2/2]
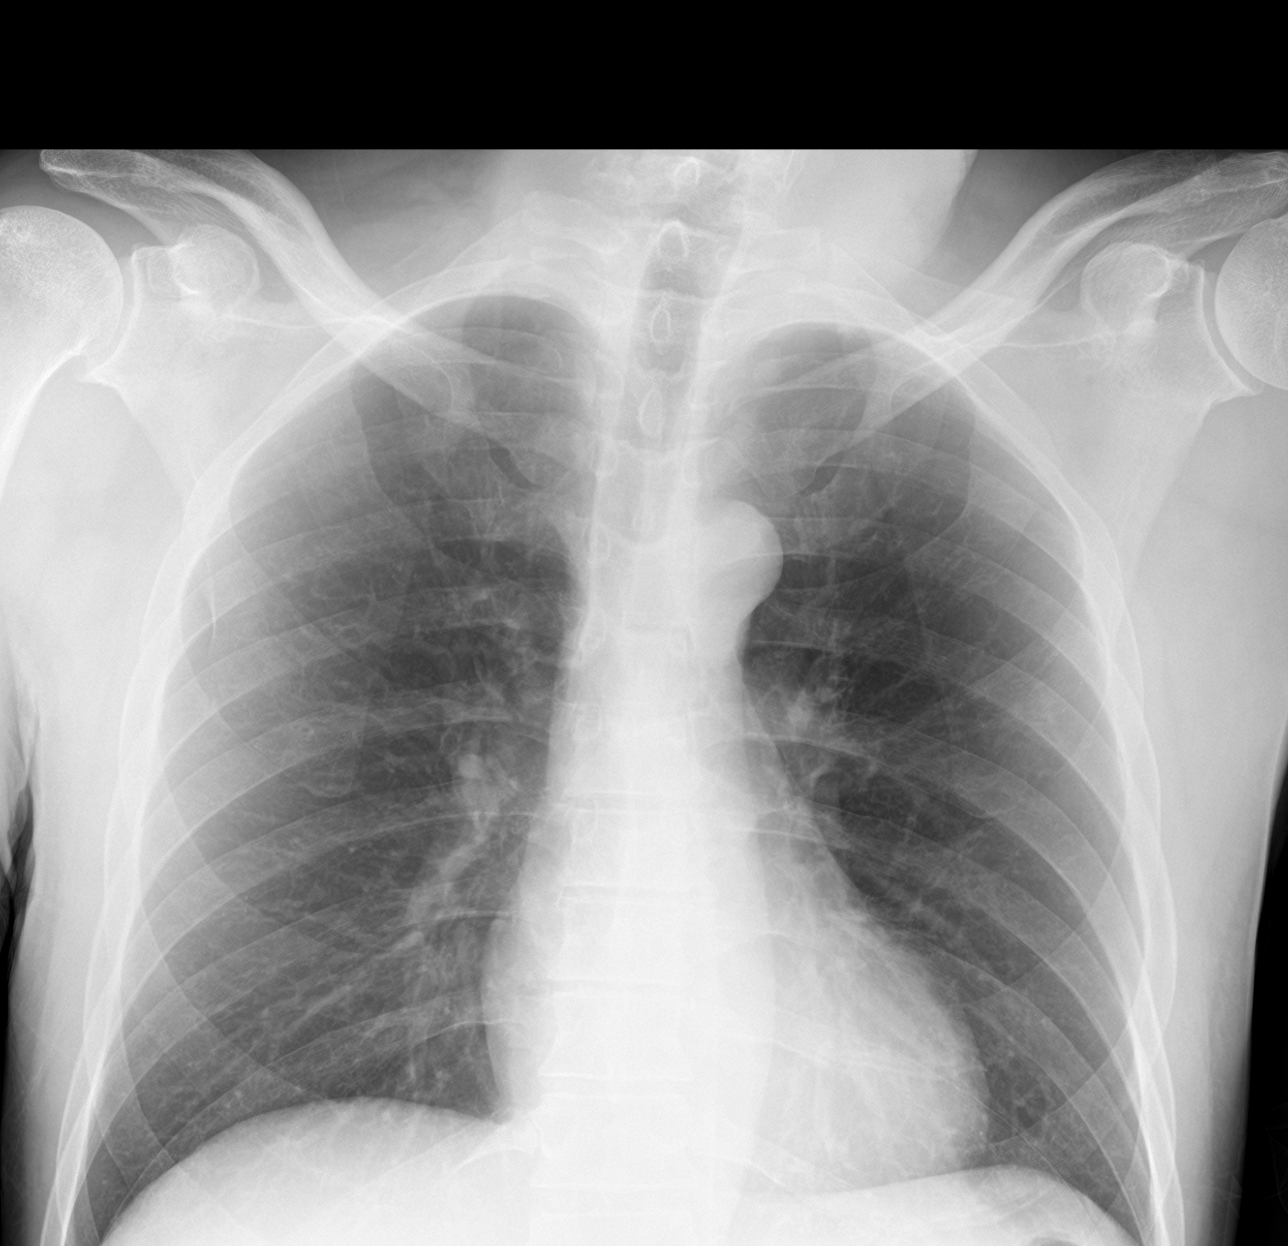

[chest lat]
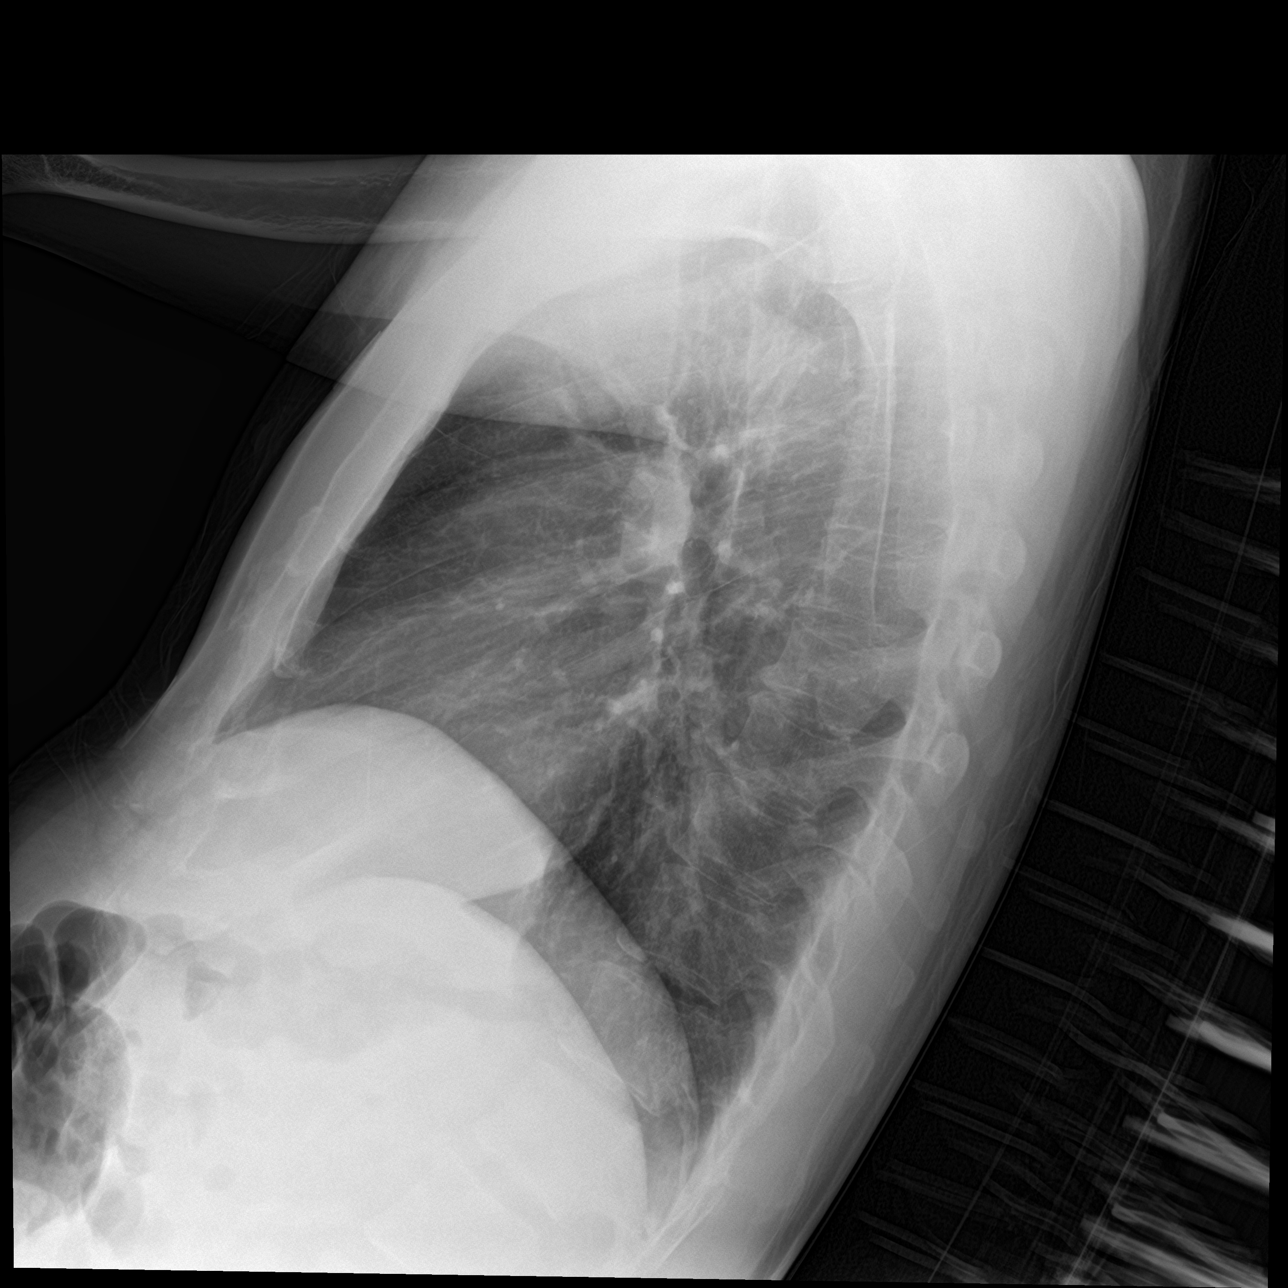

[3 of 3 positions shown; findings below may reference images not displayed]

FINDINGS: Heart size within normal limits.

There is no appreciable airspace consolidation.

No evidence of pleural effusion or pneumothorax.

No acute bony abnormality identified.
IMPRESSION: No evidence of acute cardiopulmonary abnormality.

## 2020-05-26 NOTE — Telephone Encounter (Signed)
Individual has been contacted 3+ times regarding ED referral. No further attempts to contact individual will be made. 

## 2020-06-29 ENCOUNTER — Other Ambulatory Visit: Payer: Self-pay

## 2020-06-29 ENCOUNTER — Emergency Department: Payer: Self-pay

## 2020-06-29 ENCOUNTER — Inpatient Hospital Stay
Admission: EM | Admit: 2020-06-29 | Discharge: 2020-07-02 | DRG: 440 | Disposition: A | Discharge status: Home / Self Care | Payer: Self-pay | Attending: Family Medicine | Admitting: Family Medicine

## 2020-06-29 ENCOUNTER — Encounter: Payer: Self-pay | Admitting: Emergency Medicine

## 2020-06-29 DIAGNOSIS — IMO0002 Reserved for concepts with insufficient information to code with codable children: Secondary | ICD-10-CM | POA: Diagnosis present

## 2020-06-29 DIAGNOSIS — K859 Acute pancreatitis without necrosis or infection, unspecified: Secondary | ICD-10-CM | POA: Diagnosis present

## 2020-06-29 DIAGNOSIS — K852 Alcohol induced acute pancreatitis without necrosis or infection: Principal | ICD-10-CM | POA: Diagnosis present

## 2020-06-29 DIAGNOSIS — Z283 Underimmunization status: Secondary | ICD-10-CM

## 2020-06-29 DIAGNOSIS — Z880 Allergy status to penicillin: Secondary | ICD-10-CM

## 2020-06-29 DIAGNOSIS — B1920 Unspecified viral hepatitis C without hepatic coma: Secondary | ICD-10-CM | POA: Diagnosis present

## 2020-06-29 DIAGNOSIS — N400 Enlarged prostate without lower urinary tract symptoms: Secondary | ICD-10-CM | POA: Diagnosis present

## 2020-06-29 DIAGNOSIS — F1020 Alcohol dependence, uncomplicated: Secondary | ICD-10-CM | POA: Diagnosis present

## 2020-06-29 DIAGNOSIS — E860 Dehydration: Secondary | ICD-10-CM | POA: Diagnosis present

## 2020-06-29 DIAGNOSIS — I480 Paroxysmal atrial fibrillation: Secondary | ICD-10-CM | POA: Diagnosis present

## 2020-06-29 DIAGNOSIS — I160 Hypertensive urgency: Secondary | ICD-10-CM | POA: Diagnosis present

## 2020-06-29 DIAGNOSIS — R197 Diarrhea, unspecified: Secondary | ICD-10-CM | POA: Diagnosis present

## 2020-06-29 DIAGNOSIS — Z716 Tobacco abuse counseling: Secondary | ICD-10-CM

## 2020-06-29 DIAGNOSIS — Z7141 Alcohol abuse counseling and surveillance of alcoholic: Secondary | ICD-10-CM

## 2020-06-29 DIAGNOSIS — Z79899 Other long term (current) drug therapy: Secondary | ICD-10-CM

## 2020-06-29 DIAGNOSIS — F1410 Cocaine abuse, uncomplicated: Secondary | ICD-10-CM | POA: Diagnosis present

## 2020-06-29 DIAGNOSIS — Z813 Family history of other psychoactive substance abuse and dependence: Secondary | ICD-10-CM

## 2020-06-29 DIAGNOSIS — Z20822 Contact with and (suspected) exposure to covid-19: Secondary | ICD-10-CM | POA: Diagnosis present

## 2020-06-29 DIAGNOSIS — K760 Fatty (change of) liver, not elsewhere classified: Secondary | ICD-10-CM | POA: Diagnosis present

## 2020-06-29 DIAGNOSIS — F17210 Nicotine dependence, cigarettes, uncomplicated: Secondary | ICD-10-CM | POA: Diagnosis present

## 2020-06-29 LAB — CBC
HCT: 47.5 % (ref 39.0–52.0)
Hemoglobin: 17.5 g/dL — ABNORMAL HIGH (ref 13.0–17.0)
MCH: 32.6 pg (ref 26.0–34.0)
MCHC: 36.8 g/dL — ABNORMAL HIGH (ref 30.0–36.0)
MCV: 88.6 fL (ref 80.0–100.0)
Platelets: 204 10*3/uL (ref 150–400)
RBC: 5.36 MIL/uL (ref 4.22–5.81)
RDW: 12.5 % (ref 11.5–15.5)
WBC: 5.4 10*3/uL (ref 4.0–10.5)
nRBC: 0 % (ref 0.0–0.2)

## 2020-06-29 LAB — URINALYSIS, COMPLETE (UACMP) WITH MICROSCOPIC
Bacteria, UA: NONE SEEN
Bilirubin Urine: NEGATIVE
Glucose, UA: NEGATIVE mg/dL
Hgb urine dipstick: NEGATIVE
Ketones, ur: 20 mg/dL — AB
Leukocytes,Ua: NEGATIVE
Nitrite: NEGATIVE
Protein, ur: NEGATIVE mg/dL
Specific Gravity, Urine: 1.021 (ref 1.005–1.030)
pH: 5 (ref 5.0–8.0)

## 2020-06-29 LAB — LIPASE, BLOOD: Lipase: 346 U/L — ABNORMAL HIGH (ref 11–51)

## 2020-06-29 LAB — COMPREHENSIVE METABOLIC PANEL
ALT: 42 U/L (ref 0–44)
AST: 63 U/L — ABNORMAL HIGH (ref 15–41)
Albumin: 4.7 g/dL (ref 3.5–5.0)
Alkaline Phosphatase: 94 U/L (ref 38–126)
Anion gap: 14 (ref 5–15)
BUN: 12 mg/dL (ref 6–20)
CO2: 22 mmol/L (ref 22–32)
Calcium: 10 mg/dL (ref 8.9–10.3)
Chloride: 104 mmol/L (ref 98–111)
Creatinine, Ser: 0.9 mg/dL (ref 0.61–1.24)
GFR calc Af Amer: >60 mL/min (ref >60)
GFR calc non Af Amer: >60 mL/min (ref >60)
Glucose, Bld: 101 mg/dL — ABNORMAL HIGH (ref 70–99)
Potassium: 4.2 mmol/L (ref 3.5–5.1)
Sodium: 140 mmol/L (ref 135–145)
Total Bilirubin: 1.5 mg/dL — ABNORMAL HIGH (ref 0.3–1.2)
Total Protein: 9.2 g/dL — ABNORMAL HIGH (ref 6.5–8.1)

## 2020-06-29 MED ORDER — ONDANSETRON HCL 4 MG PO TABS: 4.0000 mg | ORAL_TABLET | Freq: Four times a day (QID) | ORAL | Status: DC | PRN

## 2020-06-29 MED ORDER — LORAZEPAM 2 MG/ML IJ SOLN: 0.0000 mg | Freq: Two times a day (BID) | INTRAMUSCULAR | Status: DC

## 2020-06-29 MED ORDER — SODIUM CHLORIDE 0.9 % IV SOLN: INTRAVENOUS | Status: DC

## 2020-06-29 MED ORDER — TAMSULOSIN HCL 0.4 MG PO CAPS
0.4000 mg | ORAL_CAPSULE | Freq: Every day | ORAL | Status: DC
  Administered 2020-06-30 – 2020-07-02 (×3): 0.4 mg via ORAL
  Filled 2020-06-29 (×3): qty 1

## 2020-06-29 MED ORDER — AMLODIPINE BESYLATE 5 MG PO TABS
5.0000 mg | ORAL_TABLET | Freq: Every day | ORAL | Status: DC
  Administered 2020-06-30 – 2020-07-01 (×2): 5 mg via ORAL
  Filled 2020-06-29 (×3): qty 1

## 2020-06-29 MED ORDER — FOLIC ACID 1 MG PO TABS
1.0000 mg | ORAL_TABLET | Freq: Every day | ORAL | Status: DC
  Administered 2020-06-30 – 2020-07-02 (×3): 1 mg via ORAL
  Filled 2020-06-29 (×3): qty 1

## 2020-06-29 MED ORDER — ONDANSETRON HCL 4 MG PO TABS: 4.0000 mg | ORAL_TABLET | Freq: Three times a day (TID) | ORAL | Status: DC | PRN

## 2020-06-29 MED ORDER — ONDANSETRON HCL 4 MG/2ML IJ SOLN: 4.0000 mg | Freq: Four times a day (QID) | INTRAMUSCULAR | Status: DC | PRN

## 2020-06-29 MED ORDER — LORAZEPAM 2 MG PO TABS: 0.0000 mg | ORAL_TABLET | Freq: Two times a day (BID) | ORAL | Status: DC

## 2020-06-29 MED ORDER — OXYCODONE-ACETAMINOPHEN 5-325 MG PO TABS: 1.0000 | ORAL_TABLET | Freq: Once | ORAL | Status: DC

## 2020-06-29 MED ORDER — LORAZEPAM 2 MG/ML IJ SOLN
0.0000 mg | Freq: Four times a day (QID) | INTRAMUSCULAR | Status: AC
  Administered 2020-06-29: 1 mg via INTRAVENOUS
  Filled 2020-06-29: qty 1

## 2020-06-29 MED ORDER — ONDANSETRON HCL 4 MG/2ML IJ SOLN
INTRAMUSCULAR | Status: AC
  Administered 2020-06-29: 4 mg via INTRAVENOUS
  Filled 2020-06-29: qty 2

## 2020-06-29 MED ORDER — HYDROMORPHONE HCL 1 MG/ML IJ SOLN
1.0000 mg | Freq: Once | INTRAMUSCULAR | Status: AC
  Administered 2020-06-29: 1 mg via INTRAVENOUS
  Filled 2020-06-29: qty 1

## 2020-06-29 MED ORDER — OXYCODONE-ACETAMINOPHEN 5-325 MG PO TABS
1.0000 | ORAL_TABLET | ORAL | Status: DC | PRN
  Filled 2020-06-29: qty 1

## 2020-06-29 MED ORDER — ONDANSETRON 4 MG PO TBDP
4.0000 mg | ORAL_TABLET | Freq: Once | ORAL | Status: AC | PRN
  Administered 2020-06-29: 4 mg via ORAL
  Filled 2020-06-29: qty 1

## 2020-06-29 MED ORDER — ENOXAPARIN SODIUM 40 MG/0.4ML ~~LOC~~ SOLN
40.0000 mg | SUBCUTANEOUS | Status: DC
  Administered 2020-06-29 – 2020-07-01 (×3): 40 mg via SUBCUTANEOUS
  Filled 2020-06-29 (×3): qty 0.4

## 2020-06-29 MED ORDER — TRAZODONE HCL 50 MG PO TABS
25.0000 mg | ORAL_TABLET | Freq: Every evening | ORAL | Status: DC | PRN
  Administered 2020-07-01: 25 mg via ORAL
  Filled 2020-06-29: qty 1

## 2020-06-29 MED ORDER — ADULT MULTIVITAMIN W/MINERALS CH
1.0000 | ORAL_TABLET | Freq: Every day | ORAL | Status: DC
  Administered 2020-06-30 – 2020-07-02 (×3): 1 via ORAL
  Filled 2020-06-29 (×4): qty 1

## 2020-06-29 MED ORDER — MAGNESIUM HYDROXIDE 400 MG/5ML PO SUSP: 30.0000 mL | Freq: Every day | ORAL | Status: DC | PRN

## 2020-06-29 MED ORDER — THIAMINE HCL 100 MG PO TABS
100.0000 mg | ORAL_TABLET | Freq: Every day | ORAL | Status: DC
  Administered 2020-06-30 – 2020-07-02 (×3): 100 mg via ORAL
  Filled 2020-06-29 (×3): qty 1

## 2020-06-29 MED ORDER — THIAMINE HCL 100 MG/ML IJ SOLN: 100.0000 mg | Freq: Every day | INTRAMUSCULAR | Status: DC

## 2020-06-29 MED ORDER — LACTATED RINGERS IV BOLUS
1000.0000 mL | Freq: Once | INTRAVENOUS | Status: AC
  Administered 2020-06-29: 1000 mL via INTRAVENOUS

## 2020-06-29 MED ORDER — LORAZEPAM 2 MG PO TABS
0.0000 mg | ORAL_TABLET | Freq: Four times a day (QID) | ORAL | Status: AC
  Administered 2020-06-30: 1 mg via ORAL
  Administered 2020-06-30: 2 mg via ORAL
  Administered 2020-07-01: 1 mg via ORAL
  Filled 2020-06-29 (×3): qty 1

## 2020-06-29 MED ORDER — ONDANSETRON HCL 4 MG/2ML IJ SOLN: 4.0000 mg | Freq: Once | INTRAMUSCULAR | Status: AC

## 2020-06-29 MED ORDER — OXYCODONE HCL 5 MG PO TABS
5.0000 mg | ORAL_TABLET | Freq: Three times a day (TID) | ORAL | Status: DC | PRN
  Administered 2020-06-30 – 2020-07-01 (×5): 5 mg via ORAL
  Filled 2020-06-29 (×6): qty 1

## 2020-06-29 MED ORDER — ACETAMINOPHEN 650 MG RE SUPP: 650.0000 mg | Freq: Four times a day (QID) | RECTAL | Status: DC | PRN

## 2020-06-29 MED ORDER — ACETAMINOPHEN 325 MG PO TABS
650.0000 mg | ORAL_TABLET | Freq: Four times a day (QID) | ORAL | Status: DC | PRN
  Administered 2020-06-30 – 2020-07-02 (×6): 650 mg via ORAL
  Filled 2020-06-29 (×6): qty 2

## 2020-06-29 MED ORDER — IOHEXOL 300 MG/ML  SOLN
100.0000 mL | Freq: Once | INTRAMUSCULAR | Status: AC | PRN
  Administered 2020-06-29: 100 mL via INTRAVENOUS

## 2020-06-29 MED ORDER — HYDROMORPHONE HCL 1 MG/ML IJ SOLN
0.5000 mg | Freq: Once | INTRAMUSCULAR | Status: AC
  Administered 2020-06-29: 0.5 mg via INTRAVENOUS
  Filled 2020-06-29: qty 1

## 2020-06-29 MED ORDER — THIAMINE HCL 100 MG PO TABS: 100.0000 mg | ORAL_TABLET | Freq: Every day | ORAL | Status: DC

## 2020-06-29 NOTE — ED Notes (Signed)
PT vomiting. MD aware

## 2020-06-29 NOTE — ED Triage Notes (Signed)
Patient to ER for c/o upper abd pain (bilateral) since yesterday. Patient reports +N/V. Denies fevers. Patient reports history of the same and being diagnosed with pancreatitis.

## 2020-06-29 NOTE — ED Notes (Signed)
PT given ice  Chips with MD permission

## 2020-06-29 NOTE — ED Provider Notes (Signed)
[facility] Emergency Department Provider Note  ____________________________________________   First MD Initiated Contact with Patient 06/29/20 2022     (approximate)  I have reviewed the triage vital signs and the nursing notes.   HISTORY  Chief Complaint Abdominal Pain   HPI Bruce [name] is a 49 y.o. male with a past medical history of recurrent alcoholic acute pancreatitis, A. fib, hep C, and polysubstance abuse who presents for assessment of right upper quadrant abdominal pain and right flank pain that began yesterday and has gotten worse today. Patient states that the day before yesterday he had 4 or 5 beers but no alcohol since. Endorses nonbloody nonbilious vomiting and nonbloody diarrhea yesterday. He states he has had some burning with urination and difficulty initiating his stream but denies any other acute pain. Specifically denies any fevers, chills, headache, earache, sore throat, chest pain, cough, shortness of breath, rash, extremity pain, blood in stool, blood in his urine, or other acute complaints. States he feels similar to episodes of pancreatitis he has had in the past. No clear alleviating aggravating factors per patient.         Past Medical History:  Diagnosis Date  . A-fib (HCC)   . Dental caries   . Hepatitis C infection   . Iritis   . PAF (paroxysmal atrial fibrillation) (HCC)    a. diagnosed 03/19/18; b. CHADS2VASc 0  . Polysubstance abuse (HCC)    a. cocaine, tobacco, etoh  . Scrotal mass   . Tick borne fever   . Uveitis     Patient Active Problem List   Diagnosis Date Noted  . Recurrent pancreatitis 06/29/2020  . Alcohol induced acute pancreatitis without necrosis or infection 05/03/2020  . Polysubstance abuse (HCC)   . Hypokalemia   . Alcohol abuse   . Acute pancreatitis 01/12/2020  . Thrombocytopenia (HCC) 06/25/2019  . Elevated troponin   . Pancreatitis 06/23/2019  . AF (paroxysmal atrial fibrillation) (HCC)  09/13/2018  . Atrial fibrillation (HCC) 03/19/2018  . Abnormality of gait 02/12/2013  . Mandibular fracture (HCC) 12/25/2012  . Flu vaccine need 12/25/2012  . Scrotal mass 05/22/2012  . Dental caries 05/22/2012  . Hepatitis C infection 05/16/2012  . Myalgia 05/06/2012  . Arthralgia 05/06/2012  . Tick borne fever, likely 05/05/2012  . Tinea cruris, likely 05/05/2012  . Spermatocele of epididymis 05/01/2012  . Bilateral varicoceles 05/01/2012  . Bilateral hydrocele 05/01/2012    Past Surgical History:  Procedure Laterality Date  . FINGER SURGERY     infection, middle left  . fractured jaw    . SHOULDER SURGERY     right    Prior to Admission medications   Medication Sig Start Date End Date Taking? Authorizing Provider  acetaminophen (TYLENOL) 500 MG tablet Take 500-1,000 mg by mouth every 6 (six) hours as needed for mild pain or fever.    [provider]  amLODipine (NORVASC) 5 MG tablet Take 1 tablet (5 mg total) by mouth daily. 03/16/20   Pennie BanterGriffith, Kelly A, DO  folic acid (FOLVITE) 1 MG tablet Take 1 tablet (1 mg total) by mouth daily. 05/07/20   Hongalgi, Maximino GreenlandAnand D, MD  Multiple Vitamin (MULTIVITAMIN WITH MINERALS) TABS tablet Take 1 tablet by mouth daily. 05/07/20   Hongalgi, Maximino GreenlandAnand D, MD  ondansetron (ZOFRAN) 4 MG tablet Take 1 tablet (4 mg total) by mouth every 8 (eight) hours as needed for nausea or vomiting. 05/06/20   Hongalgi, Maximino GreenlandAnand D, MD  oxyCODONE (ROXICODONE) 5 MG immediate  release tablet Take 1 tablet (5 mg total) by mouth every 8 (eight) hours as needed for moderate pain or severe pain. 05/06/20   Hongalgi, Maximino Greenland, MD  tamsulosin (FLOMAX) 0.4 MG CAPS capsule Take 1 capsule (0.4 mg total) by mouth daily after breakfast. 03/16/20   Esaw Grandchild A, DO  thiamine 100 MG tablet Take 1 tablet (100 mg total) by mouth daily. 05/07/20   Elease Etienne, MD    Allergies Penicillins  Family History  Problem Relation Age of Onset  . Drug abuse Father     Social  History Social History   Tobacco Use  . Smoking status: Current Some Day Smoker    Packs/day: 0.25    Types: Cigarettes  . Smokeless tobacco: Never Used  . Tobacco comment: Per pt  " I only smoke cigarettes when I drink beer"  Vaping Use  . Vaping Use: Never used  Substance Use Topics  . Alcohol use: Yes    Comment: 6 pack per week  . Drug use: Yes    Types: IV, Cocaine    Comment: Pt states that he last used "a while back"    Review of Systems  Review of Systems  Constitutional: Negative for chills and fever.  HENT: Negative for sore throat.   Eyes: Negative for pain.  Respiratory: Negative for cough and stridor.   Cardiovascular: Negative for chest pain.  Gastrointestinal: Positive for abdominal pain, diarrhea, nausea and vomiting.  Genitourinary: Positive for dysuria and flank pain.  Skin: Negative for rash.  Neurological: Negative for seizures, loss of consciousness and headaches.  Psychiatric/Behavioral: Negative for suicidal ideas.  All other systems reviewed and are negative.     ____________________________________________   PHYSICAL EXAM:  VITAL SIGNS: ED Triage Vitals  Enc Vitals Group     BP 06/29/20 1151 124/79     Pulse Rate 06/29/20 1151 93     Resp 06/29/20 1151 20     Temp 06/29/20 1151 98 F (36.7 C)     Temp Source 06/29/20 1151 Oral     SpO2 06/29/20 1151 99 %     Weight 06/29/20 1152 165 lb (74.8 kg)     Height 06/29/20 1152 5\' 7"  (1.702 m)     Head Circumference --      Peak Flow --      Pain Score 06/29/20 1152 10     Pain Loc --      Pain Edu? --      Excl. in GC? --    Vitals:   06/29/20 2200 06/29/20 2216  BP: (!) 147/90 (!) 147/90  Pulse: 62   Resp:    Temp:    SpO2: 100%    Physical Exam Vitals and nursing note reviewed.  Constitutional:      General: He is in acute distress.     Appearance: He is well-developed.  HENT:     Head: Normocephalic and atraumatic.     Right Ear: External ear normal.     Left Ear:  External ear normal.     Nose: Nose normal.     Mouth/Throat:     Mouth: Mucous membranes are dry.  Eyes:     Conjunctiva/sclera: Conjunctivae normal.  Cardiovascular:     Rate and Rhythm: Normal rate and regular rhythm.     Heart sounds: No murmur heard.   Pulmonary:     Effort: Pulmonary effort is normal. No respiratory distress.     Breath sounds: Normal breath sounds.  Abdominal:     Palpations: Abdomen is soft.     Tenderness: There is abdominal tenderness in the right upper quadrant and epigastric area. There is no right CVA tenderness or left CVA tenderness.  Musculoskeletal:     Cervical back: Neck supple.     Right lower leg: No edema.     Left lower leg: No edema.  Skin:    General: Skin is warm and dry.     Capillary Refill: Capillary refill takes 2 to 3 seconds.  Neurological:     General: No focal deficit present.     Mental Status: He is alert.  Psychiatric:        Mood and Affect: Mood normal.      ____________________________________________   LABS (all labs ordered are listed, but only abnormal results are displayed)  Labs Reviewed  LIPASE, BLOOD - Abnormal; Notable for the following components:      Result Value   Lipase 346 (*)    All other components within normal limits  COMPREHENSIVE METABOLIC PANEL - Abnormal; Notable for the following components:   Glucose, Bld 101 (*)    Total Protein 9.2 (*)    AST 63 (*)    Total Bilirubin 1.5 (*)    All other components within normal limits  CBC - Abnormal; Notable for the following components:   Hemoglobin 17.5 (*)    MCHC 36.8 (*)    All other components within normal limits  URINALYSIS, COMPLETE (UACMP) WITH MICROSCOPIC - Abnormal; Notable for the following components:   Color, Urine YELLOW (*)    APPearance CLEAR (*)    Ketones, ur 20 (*)    All other components within normal limits  CHLAMYDIA/NGC RT PCR ([facility] ONLY)  SARS CORONAVIRUS 2 BY RT PCR (HOSPITAL ORDER, PERFORMED IN [facility])   ____________________________________________  EKG  EKG with sinus rhythm with a ventricular rate of 81, nonspecific T wave changes in lateral leads and no other clear evidence of acute ischemia or other significant underlying arrhythmia.  Unremarkable axis.  Unremarkable intervals. ____________________________________________  RADIOLOGY  ED MD interpretation: Reviewed by myself and taken to consideration during all medical decision making.  Most consistent with acute uncomplicated pancreatitis.  Official radiology report(s): CT ABDOMEN PELVIS W CONTRAST  Result Date: 06/29/2020 CLINICAL DATA:  Acute, nonlocalized abdominal pain, nausea, vomiting EXAM: CT ABDOMEN AND PELVIS WITH CONTRAST TECHNIQUE: Multidetector CT imaging of the abdomen and pelvis was performed using the standard protocol following bolus administration of intravenous contrast. CONTRAST:  OMNIPAQUE IOHEXOL 300 MG/ML  SOLN COMPARISON:  03/10/2019 FINDINGS: Lower chest: The visualized lung bases are clear bilaterally. The visualized heart and pericardium are unremarkable Hepatobiliary: Mild hepatic steatosis. No intrahepatic mass identified. Gallbladder unremarkable. No intra or extrahepatic biliary ductal dilation. Pancreas: Mild peripancreatic inflammatory changes identified with mild infiltration of the peripancreatic fat best appreciated within the pancreatic head and uncinate process. There is normal enhancement of the pancreatic parenchyma. The pancreatic duct is not dilated. No pancreatic parenchymal calcifications are identified. The findings are similar to that noted on prior examination. Spleen: Unremarkable. Adrenals/Urinary Tract: The adrenal glands are unremarkable. The kidneys are normal. The bladder is unremarkable Stomach/Bowel: Mild circumferential wall thickening. Duodenal inflammatory stranding involving the second portion of the duodenum likely related to the adjacent inflammatory  process. No evidence of obstruction. The stomach, small bowel, and large bowel are unremarkable. Appendix normal. No free intraperitoneal gas or fluid. Vascular/Lymphatic: The abdominal vasculature is normal. No pathologic adenopathy  within the abdomen and pelvis. Reproductive: Prostate is unremarkable. Other: Rectum unremarkable. Musculoskeletal: Degenerative changes are seen within the lumbar spine. No lytic or blastic bone lesions are seen. IMPRESSION: 1. Mild acute uncomplicated pancreatitis. 2. Mild hepatic steatosis. Electronically Signed   By: Helyn Numbers MD   On: 06/29/2020 21:30    ____________________________________________   PROCEDURES  Procedure(s) performed (including Critical Care):  Procedures   ____________________________________________   INITIAL IMPRESSION / ASSESSMENT AND PLAN / ED COURSE        Patient presents with [place] to history exam for assessment of abdominal pain associate with some binge drinking 3 days ago.  Patient is hypertensive otherwise stable vital signs on arrival.  Exam as above.  CT does show evidence of acute uncomplicated pancreatitis.  This is consistent with patient's history, exam, and elevated lipase in the 300s.  There is no evidence of pancreatitis, diverticulitis, pyelonephritis, perforated viscus, or other acute intra-abdominal process.  CMP shows a slightly elevated AST which is consistent with patient's known EtOH abuse but no other significant electrolyte or metabolic derangements.  CBC is unremarkable.  UA shows ketones which is consistent with some mild dehydration but no evidence of infection.  However given reports of dysuria we will send urine GC studies.  We will also plan to send a Covid PCR.  Patient pain and nausea treated aggressively with the below noted medications.  However on reassessment patient is still complaining of severe pain and dry heaving after attempting to drink some water.  We will plan to admit to medicine service  for observation as well as pain control, nausea control, and hydration.  Medications  LORazepam (ATIVAN) injection 0-4 mg (has no administration in time range)    Or  LORazepam (ATIVAN) tablet 0-4 mg (has no administration in time range)  LORazepam (ATIVAN) injection 0-4 mg (has no administration in time range)    Or  LORazepam (ATIVAN) tablet 0-4 mg (has no administration in time range)  thiamine tablet 100 mg (has no administration in time range)    Or  thiamine (B-1) injection 100 mg (has no administration in time range)  ondansetron (ZOFRAN-ODT) disintegrating tablet 4 mg (4 mg Oral Given 06/29/20 1157)  lactated ringers bolus 1,000 mL (1,000 mLs Intravenous New Bag/Given 06/29/20 2054)  HYDROmorphone (DILAUDID) injection 1 mg (1 mg Intravenous Given 06/29/20 2041)  ondansetron (ZOFRAN) injection 4 mg (4 mg Intravenous Given 06/29/20 2047)  iohexol (OMNIPAQUE) 300 MG/ML solution 100 mL (100 mLs Intravenous Contrast Given 06/29/20 2115)  HYDROmorphone (DILAUDID) injection 0.5 mg (0.5 mg Intravenous Given 06/29/20 2215)  ____________________________________________   FINAL CLINICAL IMPRESSION(S) / ED DIAGNOSES  Final diagnoses:  Alcohol-induced acute pancreatitis without infection or necrosis  Dehydration  Alcohol abuse counseling and surveillance     ED Discharge Orders    None       Note:  This document was prepared using Dragon voice recognition software and may include unintentional dictation errors.   Gilles Chiquito, MD 06/29/20 2218

## 2020-06-29 NOTE — ED Notes (Signed)
Pt continues with complaints of ongoing severe abdominal pain to this RN and EDT, Melanie.  See new orders.

## 2020-06-30 DIAGNOSIS — K859 Acute pancreatitis without necrosis or infection, unspecified: Secondary | ICD-10-CM

## 2020-06-30 DIAGNOSIS — F1910 Other psychoactive substance abuse, uncomplicated: Secondary | ICD-10-CM

## 2020-06-30 DIAGNOSIS — K852 Alcohol induced acute pancreatitis without necrosis or infection: Principal | ICD-10-CM

## 2020-06-30 DIAGNOSIS — I160 Hypertensive urgency: Secondary | ICD-10-CM

## 2020-06-30 LAB — CBC
HCT: 42.7 % (ref 39.0–52.0)
Hemoglobin: 15.7 g/dL (ref 13.0–17.0)
MCH: 33.1 pg (ref 26.0–34.0)
MCHC: 36.8 g/dL — ABNORMAL HIGH (ref 30.0–36.0)
MCV: 89.9 fL (ref 80.0–100.0)
Platelets: 172 10*3/uL (ref 150–400)
RBC: 4.75 MIL/uL (ref 4.22–5.81)
RDW: 12.4 % (ref 11.5–15.5)
WBC: 5.7 10*3/uL (ref 4.0–10.5)
nRBC: 0 % (ref 0.0–0.2)

## 2020-06-30 LAB — COMPREHENSIVE METABOLIC PANEL
ALT: 39 U/L (ref 0–44)
AST: 56 U/L — ABNORMAL HIGH (ref 15–41)
Albumin: 4 g/dL (ref 3.5–5.0)
Alkaline Phosphatase: 75 U/L (ref 38–126)
Anion gap: 9 (ref 5–15)
BUN: 10 mg/dL (ref 6–20)
CO2: 25 mmol/L (ref 22–32)
Calcium: 9.1 mg/dL (ref 8.9–10.3)
Chloride: 101 mmol/L (ref 98–111)
Creatinine, Ser: 0.76 mg/dL (ref 0.61–1.24)
GFR calc Af Amer: >60 mL/min (ref >60)
GFR calc non Af Amer: >60 mL/min (ref >60)
Glucose, Bld: 131 mg/dL — ABNORMAL HIGH (ref 70–99)
Potassium: 3.6 mmol/L (ref 3.5–5.1)
Sodium: 135 mmol/L (ref 135–145)
Total Bilirubin: 1.1 mg/dL (ref 0.3–1.2)
Total Protein: 8 g/dL (ref 6.5–8.1)

## 2020-06-30 LAB — CHLAMYDIA/NGC RT PCR (ARMC ONLY)
Chlamydia Tr: NOT DETECTED
N gonorrhoeae: NOT DETECTED

## 2020-06-30 LAB — LIPASE, BLOOD: Lipase: 126 U/L — ABNORMAL HIGH (ref 11–51)

## 2020-06-30 LAB — HIV ANTIBODY (ROUTINE TESTING W REFLEX): HIV Screen 4th Generation wRfx: NONREACTIVE

## 2020-06-30 LAB — SARS CORONAVIRUS 2 BY RT PCR (HOSPITAL ORDER, PERFORMED IN ~~LOC~~ HOSPITAL LAB): SARS Coronavirus 2: NEGATIVE

## 2020-06-30 MED ORDER — LABETALOL HCL 5 MG/ML IV SOLN
20.0000 mg | INTRAVENOUS | Status: DC | PRN
  Filled 2020-06-30: qty 4

## 2020-06-30 NOTE — ED Notes (Signed)
Sheet hung over patients door due to blinds being broken and unable to shut per patient request

## 2020-06-30 NOTE — ED Notes (Signed)
Patient given warm blanket.

## 2020-06-30 NOTE — ED Notes (Signed)
Patient given wash cloths, towels, soap, incontinent cleaners, toothbrush and toothpaste

## 2020-06-30 NOTE — Progress Notes (Signed)
PROGRESS NOTE    Bruce [name]  ZWC:[id number] DOB: [birth year] DOA: 06/29/2020 PCP: Patient, No Pcp Per      Brief Narrative:   Bruce [name] is a 49 y.o. M with hx alcohol use disorder and alcohol-related pancreatitis as well as HTN who presented with 2 days vomiting and epigastric pain.  In the ER, CT showed pancreatitis.  He was started on fluids and anti-emetics and admitted to the hospitalist service.       Assessment & Plan:  Recurrent alcoholic pancreatitis Patient admitted and placed on IV fluids and opiates for pain control.  This morning, attempted some clear liquids/Jell-O, and had immediate epigastric pain. -Continue IV fluids -Trend LFTs and lipase -Continue opiates for pain, antiemetics -Advance diet as tolerated  Hypertensive urgency Pressure improved this morning, mildly elevated diastolic -Continue amlodipine  Lower urinary tract symptoms -Continue Flomax  Alcohol use disorder, cocaine use disorder Cessation recommended, modalities discussed. -Continue folate, thiamine -Continue CIWA scoring with on demand Ativan for withdrawals         Disposition: Status is: Observation  The patient will require care spanning > 2 midnights and should be moved to inpatient because: Inability to take p.o. without severe pain, requiring ongoing IV fluids  Dispo: The patient is from: Home              Anticipated d/c is to: Home              Anticipated d/c date is: 2 days              Patient currently is not medically stable to d/c.              MDM: This is a no charge note.  For further details, please see H&P by my partner Dr. Dr. Mertie Moores from earlier today.  The below labs and imaging reports were reviewed and summarized above.    DVT prophylaxis: enoxaparin (LOVENOX) injection 40 mg Start: 06/29/20 2245  Code Status: Full code Family Communication: None present    Consultants:   Procedures:     Antimicrobials:      Culture data:               Subjective: Patient still has epigastric this pain this morning after eating Jell-O. No fever, confusion.        Objective: Vitals:   06/30/20 1030 06/30/20 1031 06/30/20 1100 06/30/20 1130  BP: (!) 130/93 (!) 130/93 (!) 142/97 (!) 126/100  Pulse: 86 71 78 66  Resp:      Temp:      TempSrc:      SpO2: 100%  98% 98%  Weight:      Height:        Intake/Output Summary (Last 24 hours) at 06/30/2020 1200 Last data filed at 06/30/2020 0249 Gross per 24 hour  Intake 1000 ml  Output --  Net 1000 ml   Filed Weights   06/29/20 1152  Weight: 74.8 kg    Examination: The patient was seen and examined.      Data Reviewed: I have personally reviewed following labs and imaging studies:  CBC: Recent Labs  Lab 06/29/20 1156 06/30/20 0426  WBC 5.4 5.7  HGB 17.5* 15.7  HCT 47.5 42.7  MCV 88.6 89.9  PLT 204 172   Basic Metabolic Panel: Recent Labs  Lab 06/29/20 1156 06/30/20 0426  NA 140 135  K 4.2 3.6  CL 104 101  CO2 22 25  GLUCOSE 101* 131*  BUN  12 10  CREATININE 0.90 0.76  CALCIUM 10.0 9.1   GFR: Estimated Creatinine Clearance: 102.1 mL/min (by C-G formula based on SCr of 0.76 mg/dL). Liver Function Tests: Recent Labs  Lab 06/29/20 1156 06/30/20 0426  AST 63* 56*  ALT 42 39  ALKPHOS 94 75  BILITOT 1.5* 1.1  PROT 9.2* 8.0  ALBUMIN 4.7 4.0   Recent Labs  Lab 06/29/20 1156 06/30/20 0426  LIPASE 346* 126*   No results for input(s): AMMONIA in the last 168 hours. Coagulation Profile: No results for input(s): INR, PROTIME in the last 168 hours. Cardiac Enzymes: No results for input(s): CKTOTAL, CKMB, CKMBINDEX, TROPONINI in the last 168 hours. BNP (last 3 results) No results for input(s): PROBNP in the last 8760 hours. HbA1C: No results for input(s): HGBA1C in the last 72 hours. CBG: No results for input(s): GLUCAP in the last 168 hours. Lipid Profile: No results for input(s): CHOL, HDL, LDLCALC, TRIG, CHOLHDL,  LDLDIRECT in the last 72 hours. Thyroid Function Tests: No results for input(s): TSH, T4TOTAL, FREET4, T3FREE, THYROIDAB in the last 72 hours. Anemia Panel: No results for input(s): VITAMINB12, FOLATE, FERRITIN, TIBC, IRON, RETICCTPCT in the last 72 hours. Urine analysis:    Component Value Date/Time   COLORURINE YELLOW (A) 06/29/2020 1155   APPEARANCEUR CLEAR (A) 06/29/2020 1155   LABSPEC 1.021 06/29/2020 1155   PHURINE 5.0 06/29/2020 1155   GLUCOSEU NEGATIVE 06/29/2020 1155   HGBUR NEGATIVE 06/29/2020 1155   BILIRUBINUR NEGATIVE 06/29/2020 1155   KETONESUR 20 (A) 06/29/2020 1155   PROTEINUR NEGATIVE 06/29/2020 1155   UROBILINOGEN 1.0 05/01/2012 0835   NITRITE NEGATIVE 06/29/2020 1155   LEUKOCYTESUR NEGATIVE 06/29/2020 1155   Sepsis Labs: @LABRCNTIP (procalcitonin:4,lacticacidven:4)  ) Recent Results (from the past 240 hour(s))  Chlamydia/NGC rt PCR ([facility] only)     Status: None   Collection Time: 06/29/20 10:30 PM   Specimen: Urine  Result Value Ref Range Status   Specimen source GC/Chlam URINE, RANDOM  Final   Chlamydia Tr NOT DETECTED NOT DETECTED Final   N gonorrhoeae NOT DETECTED NOT DETECTED Final    Comment: (NOTE) This CT/NG assay has not been evaluated in patients with a history of  hysterectomy. Performed at [facility], [address] [place], [place]   SARS Coronavirus 2 by RT PCR (hospital order, performed in [facility] hospital lab) Nasopharyngeal Nasopharyngeal Swab     Status: None   Collection Time: 06/29/20 10:30 PM   Specimen: Nasopharyngeal Swab  Result Value Ref Range Status   SARS Coronavirus 2 NEGATIVE NEGATIVE Final    Comment: (NOTE) SARS-CoV-2 target nucleic acids are NOT DETECTED.  The SARS-CoV-2 RNA is generally detectable in upper and lower respiratory specimens during the acute phase of infection. The lowest concentration of SARS-CoV-2 viral copies this assay can detect is 250 copies / mL. A negative result does not  preclude SARS-CoV-2 infection and should not be used as the sole basis for treatment or other patient management decisions.  A negative result may occur with improper specimen collection / handling, submission of specimen other than nasopharyngeal swab, presence of viral mutation(s) within the areas targeted by this assay, and inadequate number of viral copies (<250 copies / mL). A negative result must be combined with clinical observations, patient history, and epidemiological information.  Fact Sheet for Patients:   07/01/20  Fact Sheet for Healthcare Providers: BoilerBrush.com.cy  This test is not yet approved or  cleared by the https://pope.com/ FDA and has been authorized  for detection and/or diagnosis of SARS-CoV-2 by FDA under an Emergency Use Authorization (EUA).  This EUA will remain in effect (meaning this test can be used) for the duration of the COVID-19 declaration under Section 564(b)(1) of the Act, 21 U.S.C. section 360bbb-3(b)(1), unless the authorization is terminated or revoked sooner.  Performed at [facility], [address] [place], [place] [address]          Radiology Studies: CT ABDOMEN PELVIS W CONTRAST  Result Date: 06/29/2020 CLINICAL DATA:  Acute, nonlocalized abdominal pain, nausea, vomiting EXAM: CT ABDOMEN AND PELVIS WITH CONTRAST TECHNIQUE: Multidetector CT imaging of the abdomen and pelvis was performed using the standard protocol following bolus administration of intravenous contrast. CONTRAST:  OMNIPAQUE IOHEXOL 300 MG/ML  SOLN COMPARISON:  03/10/2019 FINDINGS: Lower chest: The visualized lung bases are clear bilaterally. The visualized heart and pericardium are unremarkable Hepatobiliary: Mild hepatic steatosis. No intrahepatic mass identified. Gallbladder unremarkable. No intra or extrahepatic biliary ductal dilation. Pancreas: Mild peripancreatic inflammatory changes  identified with mild infiltration of the peripancreatic fat best appreciated within the pancreatic head and uncinate process. There is normal enhancement of the pancreatic parenchyma. The pancreatic duct is not dilated. No pancreatic parenchymal calcifications are identified. The findings are similar to that noted on prior examination. Spleen: Unremarkable. Adrenals/Urinary Tract: The adrenal glands are unremarkable. The kidneys are normal. The bladder is unremarkable Stomach/Bowel: Mild circumferential wall thickening. Duodenal inflammatory stranding involving the second portion of the duodenum likely related to the adjacent inflammatory process. No evidence of obstruction. The stomach, small bowel, and large bowel are unremarkable. Appendix normal. No free intraperitoneal gas or fluid. Vascular/Lymphatic: The abdominal vasculature is normal. No pathologic adenopathy within the abdomen and pelvis. Reproductive: Prostate is unremarkable. Other: Rectum unremarkable. Musculoskeletal: Degenerative changes are seen within the lumbar spine. No lytic or blastic bone lesions are seen. IMPRESSION: 1. Mild acute uncomplicated pancreatitis. 2. Mild hepatic steatosis. Electronically Signed   By: Helyn Numbers MD   On: 06/29/2020 21:30        Scheduled Meds: . amLODipine  5 mg Oral Daily  . enoxaparin (LOVENOX) injection  40 mg Subcutaneous Q24H  . folic acid  1 mg Oral Daily  . LORazepam  0-4 mg Intravenous Q6H   Or  . LORazepam  0-4 mg Oral Q6H  . [START ON 07/02/2020] LORazepam  0-4 mg Intravenous Q12H   Or  . [START ON 07/02/2020] LORazepam  0-4 mg Oral Q12H  . multivitamin with minerals  1 tablet Oral Daily  . tamsulosin  0.4 mg Oral QPC breakfast  . thiamine  100 mg Oral Daily   Or  . thiamine  100 mg Intravenous Daily   Continuous Infusions: . sodium chloride 100 mL/hr at 06/30/20 1036     LOS: 0 days    Time spent: 20 minutes    Alberteen Sam, MD [facility] 06/30/2020,  12:00 PM     Please page though AMION or Epic secure chat:  For password, contact charge nurse

## 2020-06-30 NOTE — ED Notes (Signed)
Patient given pillow by tech caitlyn

## 2020-06-30 NOTE — H&P (Signed)
[facility]   PATIENT NAME: Bruce [name]    MR#:  [id number]  DATE OF BIRTH:  04-11-1971  DATE OF ADMISSION:  06/29/2020  PRIMARY CARE PHYSICIAN: Patient, No Pcp Per   REQUESTING/REFERRING PHYSICIAN: Terrilee Files, MD  CHIEF COMPLAINT:   Chief Complaint  Patient presents with  . Abdominal Pain    HISTORY OF PRESENT ILLNESS:  Bruce [name]  is a 49 y.o. African-American male with a known history of recurrent alcoholic pancreatitis, hepatitis C, paroxysmal atrial fibrillation and polysubstance abuse, who presented to the emergency room with acute onset of epigastric abdominal pain radiating to his back with associated nausea and vomiting and occasional dry heaves.  He had diarrhea without bowel movements yesterday.  He stated he took 2 beer a couple of days ago.  He has not been able to eat for the last couple of days.  No chest pain or dyspnea or cough or wheezing.  He has not been vaccinated for COVID-19.  Upon presentation to the emergency room, blood pressure was 124/79 and later up to 160/99 with otherwise normal vital signs.  Serum lipase was 346 and CMP otherwise was remarkable for AST of 63 and total protein of 9.2 with albumin of 4.7 and total bili of 1.5.  CBC showed hemoconcentration.  His swabs for chlamydia and gonorrhea came back negative.  COVID-19 PCR is currently pending.  UA was unremarkable. EKG showed normal sinus rhythm with rate of 81 with T wave inversion inferolaterally.  Abdominal and pelvic CT scan showed mild acute uncomplicated pancreatitis with mild hepatic steatosis.  The patient was given 0.5 mg of IV Dilaudid and 1 L lactated Ringer.  He will be admitted to  an observation medical bed for further evaluation and management. PAST MEDICAL HISTORY:   Past Medical History:  Diagnosis Date  . A-fib (HCC)   . Dental caries   . Hepatitis C infection   . Iritis   . PAF (paroxysmal atrial fibrillation) (HCC)    a. diagnosed 03/19/18; b. CHADS2VASc 0  .  Polysubstance abuse (HCC)    a. cocaine, tobacco, etoh  . Scrotal mass   . Tick borne fever   . Uveitis     PAST SURGICAL HISTORY:   Past Surgical History:  Procedure Laterality Date  . FINGER SURGERY     infection, middle left  . fractured jaw    . SHOULDER SURGERY     right    SOCIAL HISTORY:   Social History   Tobacco Use  . Smoking status: Current Some Day Smoker    Packs/day: 0.25    Types: Cigarettes  . Smokeless tobacco: Never Used  . Tobacco comment: Per pt  " I only smoke cigarettes when I drink beer"  Substance Use Topics  . Alcohol use: Yes    Comment: 6 pack per week    FAMILY HISTORY:   Family History  Problem Relation Age of Onset  . Drug abuse Father     DRUG ALLERGIES:   Allergies  Allergen Reactions  . Penicillins Other (See Comments)    Did it involve swelling of the face/tongue/throat, SOB, or low BP? No Did it involve sudden or severe rash/hives, skin peeling, or any reaction on the inside of your mouth or nose? No Did you need to seek medical attention at a hospital or doctor's office? No When did it last happen? If all above answers are "NO", may proceed with cephalosporin use.    REVIEW OF SYSTEMS:  ROS As per history of present illness. All pertinent systems were reviewed above. Constitutional, HEENT, cardiovascular, respiratory, GI, GU, musculoskeletal, neuro, psychiatric, endocrine, integumentary and hematologic systems were reviewed and are otherwise negative/unremarkable except for positive findings mentioned above in the HPI.   MEDICATIONS AT HOME:   Prior to Admission medications   Medication Sig Start Date End Date Taking? Authorizing Provider  acetaminophen (TYLENOL) 500 MG tablet Take 500-1,000 mg by mouth every 6 (six) hours as needed for mild pain or fever.    [provider]  amLODipine (NORVASC) 5 MG tablet Take 1 tablet (5 mg total) by mouth daily. 03/16/20   Pennie Banter, DO  folic acid  (FOLVITE) 1 MG tablet Take 1 tablet (1 mg total) by mouth daily. 05/07/20   Hongalgi, Maximino Greenland, MD  Multiple Vitamin (MULTIVITAMIN WITH MINERALS) TABS tablet Take 1 tablet by mouth daily. 05/07/20   Hongalgi, Maximino Greenland, MD  ondansetron (ZOFRAN) 4 MG tablet Take 1 tablet (4 mg total) by mouth every 8 (eight) hours as needed for nausea or vomiting. 05/06/20   Hongalgi, Maximino Greenland, MD  oxyCODONE (ROXICODONE) 5 MG immediate release tablet Take 1 tablet (5 mg total) by mouth every 8 (eight) hours as needed for moderate pain or severe pain. 05/06/20   Hongalgi, Maximino Greenland, MD  tamsulosin (FLOMAX) 0.4 MG CAPS capsule Take 1 capsule (0.4 mg total) by mouth daily after breakfast. 03/16/20   Esaw Grandchild A, DO  thiamine 100 MG tablet Take 1 tablet (100 mg total) by mouth daily. 05/07/20   Hongalgi, Maximino Greenland, MD      VITAL SIGNS:  Blood pressure (!) 155/100, pulse 67, temperature 98 F (36.7 C), temperature source Oral, resp. rate 18, height 5\' 7"  (1.702 m), weight 74.8 kg, SpO2 99 %.  PHYSICAL EXAMINATION:  Physical Exam  GENERAL:  49 y.o.-year-old African-American patient lying in the bed with no acute distress.  EYES: Pupils equal, round, reactive to light and accommodation. No scleral icterus. Extraocular muscles intact.  HEENT: Head atraumatic, normocephalic. Oropharynx and nasopharynx clear.  NECK:  Supple, no jugular venous distention. No thyroid enlargement, no tenderness.  LUNGS: Normal breath sounds bilaterally, no wheezing, rales,rhonchi or crepitation. No use of accessory muscles of respiration.  CARDIOVASCULAR: Regular rate and rhythm, S1, S2 normal. No murmurs, rubs, or gallops.  ABDOMEN: Soft, nondistended with epigastric and left upper quadrant tenderness without rebound tenderness guarding or rigidity.. Bowel sounds present. No organomegaly or mass.  EXTREMITIES: No pedal edema, cyanosis, or clubbing.  NEUROLOGIC: Cranial nerves II through XII are intact. Muscle strength 5/5 in all extremities.  Sensation intact. Gait not checked.  PSYCHIATRIC: The patient is alert and oriented x 3.  Normal affect and good eye contact. SKIN: No obvious rash, lesion, or ulcer.   LABORATORY PANEL:   CBC Recent Labs  Lab 06/29/20 1156  WBC 5.4  HGB 17.5*  HCT 47.5  PLT 204   ------------------------------------------------------------------------------------------------------------------  Chemistries  Recent Labs  Lab 06/29/20 1156  NA 140  K 4.2  CL 104  CO2 22  GLUCOSE 101*  BUN 12  CREATININE 0.90  CALCIUM 10.0  AST 63*  ALT 42  ALKPHOS 94  BILITOT 1.5*   ------------------------------------------------------------------------------------------------------------------  Cardiac Enzymes No results for input(s): TROPONINI in the last 168 hours. ------------------------------------------------------------------------------------------------------------------  RADIOLOGY:  CT ABDOMEN PELVIS W CONTRAST  Result Date: 06/29/2020 CLINICAL DATA:  Acute, nonlocalized abdominal pain, nausea, vomiting EXAM: CT ABDOMEN AND PELVIS WITH CONTRAST TECHNIQUE: Multidetector CT imaging of the abdomen  and pelvis was performed using the standard protocol following bolus administration of intravenous contrast. CONTRAST:  OMNIPAQUE IOHEXOL 300 MG/ML  SOLN COMPARISON:  03/10/2019 FINDINGS: Lower chest: The visualized lung bases are clear bilaterally. The visualized heart and pericardium are unremarkable Hepatobiliary: Mild hepatic steatosis. No intrahepatic mass identified. Gallbladder unremarkable. No intra or extrahepatic biliary ductal dilation. Pancreas: Mild peripancreatic inflammatory changes identified with mild infiltration of the peripancreatic fat best appreciated within the pancreatic head and uncinate process. There is normal enhancement of the pancreatic parenchyma. The pancreatic duct is not dilated. No pancreatic parenchymal calcifications are identified. The findings are similar to that  noted on prior examination. Spleen: Unremarkable. Adrenals/Urinary Tract: The adrenal glands are unremarkable. The kidneys are normal. The bladder is unremarkable Stomach/Bowel: Mild circumferential wall thickening. Duodenal inflammatory stranding involving the second portion of the duodenum likely related to the adjacent inflammatory process. No evidence of obstruction. The stomach, small bowel, and large bowel are unremarkable. Appendix normal. No free intraperitoneal gas or fluid. Vascular/Lymphatic: The abdominal vasculature is normal. No pathologic adenopathy within the abdomen and pelvis. Reproductive: Prostate is unremarkable. Other: Rectum unremarkable. Musculoskeletal: Degenerative changes are seen within the lumbar spine. No lytic or blastic bone lesions are seen. IMPRESSION: 1. Mild acute uncomplicated pancreatitis. 2. Mild hepatic steatosis. Electronically Signed   By: Helyn Numbers MD   On: 06/29/2020 21:30      IMPRESSION AND PLAN:   1.  Recurrent acute alcoholic pancreatitis. -The patient will be placed in a medical bed under observation status. -Pain management will be provided. -He will be kept n.p.o. -Serial lipase levels will be followed. -He will be continued on thiamine, multivitamins and folic acid.  2.  Polysubstance abuse including alcohol, tobacco and cocaine. -He was counseled for cessation and will receive further counseling for smoking cessation. -We will utilize NicoDerm CQ patch as needed and monitor for alcohol withdrawal. -He will be placed on as needed IV Ativan  3.  Hypertensive urgency. -We will continue amlodipine. -He will be placed on as needed IV labetalol for optimal BP control.  4.  BPH. -We will continue Flomax.  5.  DVT prophylaxis. -Subcutaneous Lovenox.   All the records are reviewed and case discussed with ED provider. The plan of care was discussed in details with the patient (and family). I answered all questions. The patient agreed to  proceed with the above mentioned plan. Further management will depend upon hospital course.   CODE STATUS: Full code  Status is: Observation  The patient remains OBS appropriate and will d/c before 2 midnights.  Dispo: The patient is from: Home              Anticipated d/c is to: Home              Anticipated d/c date is: 1 day              Patient currently is not medically stable to d/c.   TOTAL TIME TAKING CARE OF THIS PATIENT: 55 minutes.    Hannah Beat M.D on 06/30/2020 at 12:04 AM  [facility]   From 7 PM-7 AM, contact night-coverage [email or web address]  CC: Primary care physician; Patient, No Pcp Per   Note: This dictation was prepared with Dragon dictation along with smaller phrase technology. Any transcriptional typo errors that result from this process are unintentional.

## 2020-07-01 ENCOUNTER — Telehealth: Payer: Self-pay | Admitting: Pharmacist

## 2020-07-01 LAB — COMPREHENSIVE METABOLIC PANEL
ALT: 37 U/L (ref 0–44)
AST: 51 U/L — ABNORMAL HIGH (ref 15–41)
Albumin: 3.5 g/dL (ref 3.5–5.0)
Alkaline Phosphatase: 67 U/L (ref 38–126)
Anion gap: 9 (ref 5–15)
BUN: 8 mg/dL (ref 6–20)
CO2: 26 mmol/L (ref 22–32)
Calcium: 8.8 mg/dL — ABNORMAL LOW (ref 8.9–10.3)
Chloride: 101 mmol/L (ref 98–111)
Creatinine, Ser: 0.78 mg/dL (ref 0.61–1.24)
GFR calc Af Amer: >60 mL/min (ref >60)
GFR calc non Af Amer: >60 mL/min (ref >60)
Glucose, Bld: 156 mg/dL — ABNORMAL HIGH (ref 70–99)
Potassium: 3.4 mmol/L — ABNORMAL LOW (ref 3.5–5.1)
Sodium: 136 mmol/L (ref 135–145)
Total Bilirubin: 0.8 mg/dL (ref 0.3–1.2)
Total Protein: 7.2 g/dL (ref 6.5–8.1)

## 2020-07-01 LAB — LIPASE, BLOOD: Lipase: 52 U/L — ABNORMAL HIGH (ref 11–51)

## 2020-07-01 MED ORDER — OXYCODONE HCL 5 MG PO TABS
5.0000 mg | ORAL_TABLET | Freq: Four times a day (QID) | ORAL | Status: DC | PRN
  Administered 2020-07-01 – 2020-07-02 (×4): 5 mg via ORAL
  Filled 2020-07-01 (×4): qty 1

## 2020-07-01 NOTE — ED Notes (Signed)
Report provided to [facility], RN  Pt transported by wheelchair by Brayton Caves, EDT.

## 2020-07-01 NOTE — Progress Notes (Signed)
PROGRESS NOTE    Bruce [name]  UVO:[id number] DOB: 1971/01/07 DOA: 06/29/2020 PCP: Patient, No Pcp Per   Brief Narrative:  Mr. Tweet is a 49 y.o. M with hx alcohol use disorder and alcohol-related pancreatitis as well as HTN who presented with 2 days vomiting and epigastric pain. In the ER, CT showed pancreatitis.  He was started on fluids and anti-emetics and admitted to the hospitalist service.   Assessment & Plan:   Active Problems:   Recurrent pancreatitis  Recurrent alcoholic pancreatitis:   Patient admitted and placed on IV fluids and opiates for pain control. This morning, attempted some clear liquids/Jell-O, and had immediate epigastric pain. -Continue IV fluids. -Trend LFTs and lipase , improving  -Continue opiates for pain, antiemetics -Advance diet as tolerated.  Hypertensive urgency : Im[proved. Pressure improved this morning, mildly elevated diastolic. -Continue amlodipine  Lower urinary tract symptoms -Continue Flomax  Alcohol use disorder, cocaine use disorder Cessation recommended, modalities discussed. -Continue folate, thiamine -Continue CIWA scoring with on demand Ativan for withdrawals   DVT prophylaxis: Lovenox Code Status: Full  Family Communication:  No one at bed side. Disposition Plan: Anticipated DC home in 1-2 days. Dispo: The patient is from: Home  Anticipated d/c is to: Home  Anticipated d/c date is: 2 days  Patient currently is not medically stable to d/c. Ongoing abdominal pain , nausea and vomiting.   Consultants:   None.  Procedures:  None.  Antimicrobials:  Anti-infectives (From admission, onward)   None      Subjective: Patient was seen and examined at bedside.  Overnight events noted.  Patient still reports having abdominal pain after taking clear liquid diet.  Objective: Vitals:   07/01/20 0109 07/01/20 0504 07/01/20 0752 07/01/20 1323  BP: (!) 142/97 (!) 154/100 (!) 131/92  125/87  Pulse: 74 73 75 66  Resp: 20 18 18 16   Temp: 98.7 F (37.1 C) 98.6 F (37 C)  98.4 F (36.9 C)  TempSrc: Oral Oral  Oral  SpO2: 100% 100% 97% 100%  Weight: 72.7 kg     Height: 5\' 7"  (1.702 m)       Intake/Output Summary (Last 24 hours) at 07/01/2020 1335 Last data filed at 07/01/2020 0752 Gross per 24 hour  Intake 240 ml  Output 850 ml  Net -610 ml   Filed Weights   06/29/20 1152 07/01/20 0109  Weight: 74.8 kg 72.7 kg    Examination:  General exam: Appears calm and comfortable  Respiratory system: Clear to auscultation. Respiratory effort normal. Cardiovascular system: S1 & S2 heard, RRR. No JVD, murmurs, rubs, gallops or clicks. No pedal edema. Gastrointestinal system: Abdomen is nondistended, soft and nontender. No organomegaly or masses felt. Normal bowel sounds heard. Central nervous system: Alert and oriented. No focal neurological deficits. Extremities: Symmetric 5 x 5 power. Skin: No rashes, lesions or ulcers Psychiatry: Judgement and insight appear normal. Mood & affect appropriate.     Data Reviewed: I have personally reviewed following labs and imaging studies  CBC: Recent Labs  Lab 06/29/20 1156 06/30/20 0426  WBC 5.4 5.7  HGB 17.5* 15.7  HCT 47.5 42.7  MCV 88.6 89.9  PLT 204 172   Basic Metabolic Panel: Recent Labs  Lab 06/29/20 1156 06/30/20 0426 07/01/20 0545  NA 140 135 136  K 4.2 3.6 3.4*  CL 104 101 101  CO2 22 25 26   GLUCOSE 101* 131* 156*  BUN 12 10 8   CREATININE 0.90 0.76 0.78  CALCIUM 10.0 9.1 8.8*  GFR: Estimated Creatinine Clearance: 102.1 mL/min (by C-G formula based on SCr of 0.78 mg/dL). Liver Function Tests: Recent Labs  Lab 06/29/20 1156 06/30/20 0426 07/01/20 0545  AST 63* 56* 51*  ALT 42 39 37  ALKPHOS 94 75 67  BILITOT 1.5* 1.1 0.8  PROT 9.2* 8.0 7.2  ALBUMIN 4.7 4.0 3.5   Recent Labs  Lab 06/29/20 1156 06/30/20 0426 07/01/20 0545  LIPASE 346* 126* 52*   No results for input(s): AMMONIA in  the last 168 hours. Coagulation Profile: No results for input(s): INR, PROTIME in the last 168 hours. Cardiac Enzymes: No results for input(s): CKTOTAL, CKMB, CKMBINDEX, TROPONINI in the last 168 hours. BNP (last 3 results) No results for input(s): PROBNP in the last 8760 hours. HbA1C: No results for input(s): HGBA1C in the last 72 hours. CBG: No results for input(s): GLUCAP in the last 168 hours. Lipid Profile: No results for input(s): CHOL, HDL, LDLCALC, TRIG, CHOLHDL, LDLDIRECT in the last 72 hours. Thyroid Function Tests: No results for input(s): TSH, T4TOTAL, FREET4, T3FREE, THYROIDAB in the last 72 hours. Anemia Panel: No results for input(s): VITAMINB12, FOLATE, FERRITIN, TIBC, IRON, RETICCTPCT in the last 72 hours. Sepsis Labs: No results for input(s): PROCALCITON, LATICACIDVEN in the last 168 hours.  Recent Results (from the past 240 hour(s))  Chlamydia/NGC rt PCR ([facility] only)     Status: None   Collection Time: 06/29/20 10:30 PM   Specimen: Urine  Result Value Ref Range Status   Specimen source GC/Chlam URINE, RANDOM  Final   Chlamydia Tr NOT DETECTED NOT DETECTED Final   N gonorrhoeae NOT DETECTED NOT DETECTED Final    Comment: (NOTE) This CT/NG assay has not been evaluated in patients with a history of  hysterectomy. Performed at [facility], [address] [place], [place] [address]   SARS Coronavirus 2 by RT PCR (hospital order, performed in [facility] hospital lab) Nasopharyngeal Nasopharyngeal Swab     Status: None   Collection Time: 06/29/20 10:30 PM   Specimen: Nasopharyngeal Swab  Result Value Ref Range Status   SARS Coronavirus 2 NEGATIVE NEGATIVE Final    Comment: (NOTE) SARS-CoV-2 target nucleic acids are NOT DETECTED.  The SARS-CoV-2 RNA is generally detectable in upper and lower respiratory specimens during the acute phase of infection. The lowest concentration of SARS-CoV-2 viral copies this assay can detect is 250 copies / mL. A  negative result does not preclude SARS-CoV-2 infection and should not be used as the sole basis for treatment or other patient management decisions.  A negative result may occur with improper specimen collection / handling, submission of specimen other than nasopharyngeal swab, presence of viral mutation(s) within the areas targeted by this assay, and inadequate number of viral copies (<250 copies / mL). A negative result must be combined with clinical observations, patient history, and epidemiological information.  Fact Sheet for Patients:   BoilerBrush.com.cy  Fact Sheet for Healthcare Providers: https://pope.com/  This test is not yet approved or  cleared by the [place] FDA and has been authorized for detection and/or diagnosis of SARS-CoV-2 by FDA under an Emergency Use Authorization (EUA).  This EUA will remain in effect (meaning this test can be used) for the duration of the COVID-19 declaration under Section 564(b)(1) of the Act, 21 U.S.C. section 360bbb-3(b)(1), unless the authorization is terminated or revoked sooner.  Performed at [facility], [address], [place], [place] [address]          Radiology Studies: CT  ABDOMEN PELVIS W CONTRAST  Result Date: 06/29/2020 CLINICAL DATA:  Acute, nonlocalized abdominal pain, nausea, vomiting EXAM: CT ABDOMEN AND PELVIS WITH CONTRAST TECHNIQUE: Multidetector CT imaging of the abdomen and pelvis was performed using the standard protocol following bolus administration of intravenous contrast. CONTRAST:  OMNIPAQUE IOHEXOL 300 MG/ML  SOLN COMPARISON:  03/10/2019 FINDINGS: Lower chest: The visualized lung bases are clear bilaterally. The visualized heart and pericardium are unremarkable Hepatobiliary: Mild hepatic steatosis. No intrahepatic mass identified. Gallbladder unremarkable. No intra or extrahepatic biliary ductal dilation. Pancreas: Mild peripancreatic  inflammatory changes identified with mild infiltration of the peripancreatic fat best appreciated within the pancreatic head and uncinate process. There is normal enhancement of the pancreatic parenchyma. The pancreatic duct is not dilated. No pancreatic parenchymal calcifications are identified. The findings are similar to that noted on prior examination. Spleen: Unremarkable. Adrenals/Urinary Tract: The adrenal glands are unremarkable. The kidneys are normal. The bladder is unremarkable Stomach/Bowel: Mild circumferential wall thickening. Duodenal inflammatory stranding involving the second portion of the duodenum likely related to the adjacent inflammatory process. No evidence of obstruction. The stomach, small bowel, and large bowel are unremarkable. Appendix normal. No free intraperitoneal gas or fluid. Vascular/Lymphatic: The abdominal vasculature is normal. No pathologic adenopathy within the abdomen and pelvis. Reproductive: Prostate is unremarkable. Other: Rectum unremarkable. Musculoskeletal: Degenerative changes are seen within the lumbar spine. No lytic or blastic bone lesions are seen. IMPRESSION: 1. Mild acute uncomplicated pancreatitis. 2. Mild hepatic steatosis. Electronically Signed   By: Helyn Numbers MD   On: 06/29/2020 21:30     Scheduled Meds: . amLODipine  5 mg Oral Daily  . enoxaparin (LOVENOX) injection  40 mg Subcutaneous Q24H  . folic acid  1 mg Oral Daily  . LORazepam  0-4 mg Intravenous Q6H   Or  . LORazepam  0-4 mg Oral Q6H  . [START ON 07/02/2020] LORazepam  0-4 mg Intravenous Q12H   Or  . [START ON 07/02/2020] LORazepam  0-4 mg Oral Q12H  . multivitamin with minerals  1 tablet Oral Daily  . tamsulosin  0.4 mg Oral QPC breakfast  . thiamine  100 mg Oral Daily   Or  . thiamine  100 mg Intravenous Daily   Continuous Infusions: . sodium chloride 100 mL/hr at 07/01/20 1034     LOS: 1 day    Time spent: 25 mins    Srihith Aquilino, MD [facility]   If  7PM-7AM, please contact night-coverage

## 2020-07-01 NOTE — Telephone Encounter (Signed)
Patient failed to provide requested 2021 financial documentation. No additional medication assistance will be provided by [facility] without the required proof of income documentation. Patient notified by letter Debra Cheek Administrative Assistant [facility] 

## 2020-07-01 NOTE — Treatment Plan (Signed)
Family member brought patient in a [employer] cheeseburger. I walked in the room and he tried to shelter it so I didnt see it and he was chewing his food acting like I didnt see. I advised him against it letting him know we cannot properly monitor patient going against recommended diet. MD Lucianne Muss notified and notified to order soft diet and monitor patient. Order placed.

## 2020-07-02 LAB — CBC
HCT: 40.6 % (ref 39.0–52.0)
Hemoglobin: 15.2 g/dL (ref 13.0–17.0)
MCH: 33.2 pg (ref 26.0–34.0)
MCHC: 37.4 g/dL — ABNORMAL HIGH (ref 30.0–36.0)
MCV: 88.6 fL (ref 80.0–100.0)
Platelets: 158 10*3/uL (ref 150–400)
RBC: 4.58 MIL/uL (ref 4.22–5.81)
RDW: 11.8 % (ref 11.5–15.5)
WBC: 2.9 10*3/uL — ABNORMAL LOW (ref 4.0–10.5)
nRBC: 0 % (ref 0.0–0.2)

## 2020-07-02 LAB — PHOSPHORUS: Phosphorus: 3 mg/dL (ref 2.5–4.6)

## 2020-07-02 LAB — LIPASE, BLOOD: Lipase: 28 U/L (ref 11–51)

## 2020-07-02 LAB — MAGNESIUM: Magnesium: 2 mg/dL (ref 1.7–2.4)

## 2020-07-02 MED ORDER — PANTOPRAZOLE SODIUM 40 MG PO TBEC: 40.0000 mg | DELAYED_RELEASE_TABLET | Freq: Every day | ORAL | 1 refills | Status: DC

## 2020-07-02 MED ORDER — ONDANSETRON HCL 4 MG PO TABS: 4.0000 mg | ORAL_TABLET | Freq: Four times a day (QID) | ORAL | 0 refills | Status: AC | PRN

## 2020-07-02 MED ORDER — ONDANSETRON HCL 4 MG PO TABS: 4.0000 mg | ORAL_TABLET | Freq: Four times a day (QID) | ORAL | 0 refills | Status: DC | PRN

## 2020-07-02 MED ORDER — AMLODIPINE BESYLATE 10 MG PO TABS
10.0000 mg | ORAL_TABLET | Freq: Every day | ORAL | 0 refills | Status: DC
Start: 2020-07-03 — End: 2020-12-26

## 2020-07-02 MED ORDER — POTASSIUM CHLORIDE 20 MEQ PO PACK
40.0000 meq | PACK | Freq: Once | ORAL | Status: AC
  Administered 2020-07-02: 40 meq via ORAL
  Filled 2020-07-02: qty 2

## 2020-07-02 MED ORDER — AMLODIPINE BESYLATE 10 MG PO TABS
10.0000 mg | ORAL_TABLET | Freq: Every day | ORAL | Status: DC
  Administered 2020-07-02: 10 mg via ORAL
  Filled 2020-07-02: qty 1

## 2020-07-02 NOTE — Discharge Instructions (Signed)
Advised to follow up PCP in one week. Advised to take amlodipine 10 mg po daily. Advised to take pantoprazole 40 mg po dialy.

## 2020-07-02 NOTE — Discharge Summary (Signed)
Physician Discharge Summary  Bruce [name] QPY:[id number] DOB: 03-01-1971 DOA: 06/29/2020  PCP: Patient, No Pcp Per  Admit date: 06/29/2020   Discharge date: 07/02/2020  Admitted From: Home.  Disposition:  Home.  Recommendations for Outpatient Follow-up:  1. Follow up with PCP in 1-2 weeks. 2. Please obtain BMP/CBC in one week. 3. Advised to take amlodipine 10 mg po daily for HTN. 4. Advised to take pantoprazole 40 mg po daily for GERD.  Home Health:  None Equipment/Devices:None.  Discharge Condition: Stable. CODE STATUS:Full code Diet recommendation:  Carb Modified  Brief Summary/ Hospital course: [name] is a 49 years old. Male with hx. alcohol use disorder and alcohol-related pancreatitis as well as HTN who presented with 2 days of vomiting and epigastric pain. In the ER, CT showed pancreatitis. His serum lipase level was 347 on arrival. He was started on fluids and anti-emetics and admitted to the hospitalist service.   He was admitted for pancreatitis, he was kept NPO. started on IV fluids and pain control.  He was also placed on CIWA protocol given history of alcoholism.  His serum lipase level trended down to almost normal on day 3.  He tolerated clear liquid diet,  advanced to full liquids and soft bland diet,  denies any nausea but continues to complain about abdominal pain.  Patient is in process to establish with a primary care physician, he has appointment.  Patient's antihypertensive medication amlodipine was increased to 10 mg daily,  Emphasized in detail about quit alcohol.  He understood and agreed.  Patient wants to go home and is discharged home on pantoprazole and Zofran as needed.   Discharge Diagnoses:  Active Problems:   Recurrent pancreatitis  Recurrent alcoholic pancreatitis:  Resolved.     Patient admitted and placed on IV fluids and opiates for pain control. This morning, attempted some clear liquids/Jell-O, and had immediate epigastric pain. -Continue IV  fluids. -Trend LFTs and lipase , improving  -Continue opiates for pain, antiemetics -Advance diet as tolerated.  Hypertensive urgency : Im[proved. Pressure improved this morning, mildly elevated diastolic. -Continue amlodipine, dose increased to 10 mg.  Lower urinary tract symptoms -Continue Flomax.  Alcohol use disorder, cocaine use disorder Cessation recommended, modalities discussed. -Continue folate, thiamine -Continue CIWA scoring with on demand Ativan for withdrawals   Discharge Instructions  Discharge Instructions    Call MD for:  difficulty breathing, headache or visual disturbances   Complete by: As directed    Call MD for:  persistant dizziness or light-headedness   Complete by: As directed    Call MD for:  persistant nausea and vomiting   Complete by: As directed    Call MD for:  temperature >100.4   Complete by: As directed    Diet - low sodium heart healthy   Complete by: As directed    Diet Carb Modified   Complete by: As directed    Discharge instructions   Complete by: As directed    Advised to follow up PCP in one week. Advised to take amlodipine 10 mg po daily. Advised to take pantoprazole 40 mg po dialy.   Increase activity slowly   Complete by: As directed      Allergies as of 07/02/2020      Reactions   Penicillins Other (See Comments)   Did it involve swelling of the face/tongue/throat, SOB, or low BP? No Did it involve sudden or severe rash/hives, skin peeling, or any reaction on the inside of your mouth or nose? No  Did you need to seek medical attention at a hospital or doctor's office? No When did it last happen? If all above answers are "NO", may proceed with cephalosporin use.      Medication List    STOP taking these medications   acetaminophen 500 MG tablet Commonly known as: TYLENOL   folic acid 1 MG tablet Commonly known as: FOLVITE   multivitamin with minerals Tabs tablet   oxyCODONE 5 MG immediate release  tablet Commonly known as: Roxicodone   tamsulosin 0.4 MG Caps capsule Commonly known as: FLOMAX   thiamine 100 MG tablet     TAKE these medications   amLODipine 10 MG tablet Commonly known as: NORVASC Take 1 tablet (10 mg total) by mouth daily. Start taking on: July 03, 2020 What changed:   medication strength  how much to take   ondansetron 4 MG tablet Commonly known as: ZOFRAN Take 1 tablet (4 mg total) by mouth every 6 (six) hours as needed for nausea. What changed:   when to take this  reasons to take this   pantoprazole 40 MG tablet Commonly known as: Protonix Take 1 tablet (40 mg total) by mouth daily.       Allergies  Allergen Reactions   Penicillins Other (See Comments)    Did it involve swelling of the face/tongue/throat, SOB, or low BP? No Did it involve sudden or severe rash/hives, skin peeling, or any reaction on the inside of your mouth or nose? No Did you need to seek medical attention at a hospital or doctor's office? No When did it last happen? If all above answers are "NO", may proceed with cephalosporin use.    Consultations:  None   Procedures/Studies: CT ABDOMEN PELVIS W CONTRAST  Result Date: 06/29/2020 CLINICAL DATA:  Acute, nonlocalized abdominal pain, nausea, vomiting EXAM: CT ABDOMEN AND PELVIS WITH CONTRAST TECHNIQUE: Multidetector CT imaging of the abdomen and pelvis was performed using the standard protocol following bolus administration of intravenous contrast. CONTRAST:  100mL OMNIPAQUE IOHEXOL 300 MG/ML  SOLN COMPARISON:  03/10/2019 FINDINGS: Lower chest: The visualized lung bases are clear bilaterally. The visualized heart and pericardium are unremarkable Hepatobiliary: Mild hepatic steatosis. No intrahepatic mass identified. Gallbladder unremarkable. No intra or extrahepatic biliary ductal dilation. Pancreas: Mild peripancreatic inflammatory changes identified with mild infiltration of the peripancreatic fat best  appreciated within the pancreatic head and uncinate process. There is normal enhancement of the pancreatic parenchyma. The pancreatic duct is not dilated. No pancreatic parenchymal calcifications are identified. The findings are similar to that noted on prior examination. Spleen: Unremarkable. Adrenals/Urinary Tract: The adrenal glands are unremarkable. The kidneys are normal. The bladder is unremarkable Stomach/Bowel: Mild circumferential wall thickening. Duodenal inflammatory stranding involving the second portion of the duodenum likely related to the adjacent inflammatory process. No evidence of obstruction. The stomach, small bowel, and large bowel are unremarkable. Appendix normal. No free intraperitoneal gas or fluid. Vascular/Lymphatic: The abdominal vasculature is normal. No pathologic adenopathy within the abdomen and pelvis. Reproductive: Prostate is unremarkable. Other: Rectum unremarkable. Musculoskeletal: Degenerative changes are seen within the lumbar spine. No lytic or blastic bone lesions are seen. IMPRESSION: 1. Mild acute uncomplicated pancreatitis. 2. Mild hepatic steatosis. Electronically Signed   By: Helyn NumbersAshesh  Parikh MD   On: 06/29/2020 21:30       Subjective: Patient was seen and examined at bed side,  No overnight events. He reports having mild pain but has tolerated bacon strips without pain, his lipase has almost normal, denies any tremors.  He wants to be discharged home.   Discharge Exam: Vitals:   07/02/20 0438 07/02/20 0815  BP: (!) 139/99 (!) 134/105  Pulse: 62 73  Resp: 16 20  Temp: 97.6 F (36.4 C)   SpO2: 100%    Vitals:   07/01/20 1557 07/01/20 2003 07/02/20 0438 07/02/20 0815  BP: (!) 127/93 (!) 132/97 (!) 139/99 (!) 134/105  Pulse: 72 65 62 73  Resp:  Temp:  98.1 F (36.7 C) 97.6 F (36.4 C)   TempSrc:  Oral Oral   SpO2:  100% 100%   Weight:      Height:        General: Pt is alert, awake, not in acute distress Cardiovascular: RRR, S1/S2 +,  no rubs, no gallops Respiratory: CTA bilaterally, no wheezing, no rhonchi Abdominal: Soft, NT, ND, bowel sounds + Extremities: no edema, no cyanosis    The results of significant diagnostics from this hospitalization (including imaging, microbiology, ancillary and laboratory) are listed below for reference.     Microbiology: Recent Results (from the past 240 hour(s))  Chlamydia/NGC rt PCR ([facility] only)     Status: None   Collection Time: 06/29/20 10:30 PM   Specimen: Urine  Result Value Ref Range Status   Specimen source GC/Chlam URINE, RANDOM  Final   Chlamydia Tr NOT DETECTED NOT DETECTED Final   N gonorrhoeae NOT DETECTED NOT DETECTED Final    Comment: (NOTE) This CT/NG assay has not been evaluated in patients with a history of  hysterectomy. Performed at [facility], [address] [place], [place] [address]   SARS Coronavirus 2 by RT PCR (hospital order, performed in [facility] hospital lab) Nasopharyngeal Nasopharyngeal Swab     Status: None   Collection Time: 06/29/20 10:30 PM   Specimen: Nasopharyngeal Swab  Result Value Ref Range Status   SARS Coronavirus 2 NEGATIVE NEGATIVE Final    Comment: (NOTE) SARS-CoV-2 target nucleic acids are NOT DETECTED.  The SARS-CoV-2 RNA is generally detectable in upper and lower respiratory specimens during the acute phase of infection. The lowest concentration of SARS-CoV-2 viral copies this assay can detect is 250 copies / mL. A negative result does not preclude SARS-CoV-2 infection and should not be used as the sole basis for treatment or other patient management decisions.  A negative result may occur with improper specimen collection / handling, submission of specimen other than nasopharyngeal swab, presence of viral mutation(s) within the areas targeted by this assay, and inadequate number of viral copies (<250 copies / mL). A negative result must be combined with clinical observations, patient history, and  epidemiological information.  Fact Sheet for Patients:   BoilerBrush.com.cy  Fact Sheet for Healthcare Providers: https://pope.com/  This test is not yet approved or  cleared by the [place] FDA and has been authorized for detection and/or diagnosis of SARS-CoV-2 by FDA under an Emergency Use Authorization (EUA).  This EUA will remain in effect (meaning this test can be used) for the duration of the COVID-19 declaration under Section 564(b)(1) of the Act, 21 U.S.C. section 360bbb-3(b)(1), unless the authorization is terminated or revoked sooner.  Performed at [facility], [address] [place], [place] [address]      Labs: BNP (last 3 results) No results for input(s): BNP in the last 8760 hours. Basic Metabolic Panel: Recent Labs  Lab 06/29/20 1156 06/30/20 0426 07/01/20 0545 07/02/20 0816  NA 140 135 136  --   K 4.2 3.6 3.4*  --  CL 104 101 101  --   CO2 22 25 26   --   GLUCOSE 101* 131* 156*  --   BUN 12 10 8   --   CREATININE 0.90 0.76 0.78  --   CALCIUM 10.0 9.1 8.8*  --   MG  --   --   --  2.0  PHOS  --   --   --  3.0   Liver Function Tests: Recent Labs  Lab 06/29/20 1156 06/30/20 0426 07/01/20 0545  AST 63* 56* 51*  ALT 42 39 37  ALKPHOS 94 75 67  BILITOT 1.5* 1.1 0.8  PROT 9.2* 8.0 7.2  ALBUMIN 4.7 4.0 3.5   Recent Labs  Lab 06/29/20 1156 06/30/20 0426 07/01/20 0545 07/02/20 0816  LIPASE 346* 126* 52* 28   No results for input(s): AMMONIA in the last 168 hours. CBC: Recent Labs  Lab 06/29/20 1156 06/30/20 0426 07/02/20 0816  WBC 5.4 5.7 2.9*  HGB 17.5* 15.7 15.2  HCT 47.5 42.7 40.6  MCV 88.6 89.9 88.6  PLT 204 172 158   Cardiac Enzymes: No results for input(s): CKTOTAL, CKMB, CKMBINDEX, TROPONINI in the last 168 hours. BNP: Invalid input(s): POCBNP CBG: No results for input(s): GLUCAP in the last 168 hours. D-Dimer No results for input(s): DDIMER in the last 72  hours. Hgb A1c No results for input(s): HGBA1C in the last 72 hours. Lipid Profile No results for input(s): CHOL, HDL, LDLCALC, TRIG, CHOLHDL, LDLDIRECT in the last 72 hours. Thyroid function studies No results for input(s): TSH, T4TOTAL, T3FREE, THYROIDAB in the last 72 hours.  Invalid input(s): FREET3 Anemia work up No results for input(s): VITAMINB12, FOLATE, FERRITIN, TIBC, IRON, RETICCTPCT in the last 72 hours. Urinalysis    Component Value Date/Time   COLORURINE YELLOW (A) 06/29/2020 1155   APPEARANCEUR CLEAR (A) 06/29/2020 1155   LABSPEC 1.021 06/29/2020 1155   PHURINE 5.0 06/29/2020 1155   GLUCOSEU NEGATIVE 06/29/2020 1155   HGBUR NEGATIVE 06/29/2020 1155   BILIRUBINUR NEGATIVE 06/29/2020 1155   KETONESUR 20 (A) 06/29/2020 1155   PROTEINUR NEGATIVE 06/29/2020 1155   UROBILINOGEN 1.0 05/01/2012 0835   NITRITE NEGATIVE 06/29/2020 1155   LEUKOCYTESUR NEGATIVE 06/29/2020 1155   Sepsis Labs Invalid input(s): PROCALCITONIN,  WBC,  LACTICIDVEN Microbiology Recent Results (from the past 240 hour(s))  Chlamydia/NGC rt PCR ([facility] only)     Status: None   Collection Time: 06/29/20 10:30 PM   Specimen: Urine  Result Value Ref Range Status   Specimen source GC/Chlam URINE, RANDOM  Final   Chlamydia Tr NOT DETECTED NOT DETECTED Final   N gonorrhoeae NOT DETECTED NOT DETECTED Final    Comment: (NOTE) This CT/NG assay has not been evaluated in patients with a history of  hysterectomy. Performed at [facility], [address] [place], [address] [place]   SARS Coronavirus 2 by RT PCR (hospital order, performed in [facility] hospital lab) Nasopharyngeal Nasopharyngeal Swab     Status: None   Collection Time: 06/29/20 10:30 PM   Specimen: Nasopharyngeal Swab  Result Value Ref Range Status   SARS Coronavirus 2 NEGATIVE NEGATIVE Final    Comment: (NOTE) SARS-CoV-2 target nucleic acids are NOT DETECTED.  The SARS-CoV-2 RNA is generally detectable in upper and  lower respiratory specimens during the acute phase of infection. The lowest concentration of SARS-CoV-2 viral copies this assay can detect is 250 copies / mL. A negative result does not preclude SARS-CoV-2 infection and should not be used as the sole basis  for treatment or other patient management decisions.  A negative result may occur with improper specimen collection / handling, submission of specimen other than nasopharyngeal swab, presence of viral mutation(s) within the areas targeted by this assay, and inadequate number of viral copies (<250 copies / mL). A negative result must be combined with clinical observations, patient history, and epidemiological information.  Fact Sheet for Patients:   BoilerBrush.com.cy  Fact Sheet for Healthcare Providers: https://pope.com/  This test is not yet approved or  cleared by the [place] FDA and has been authorized for detection and/or diagnosis of SARS-CoV-2 by FDA under an Emergency Use Authorization (EUA).  This EUA will remain in effect (meaning this test can be used) for the duration of the COVID-19 declaration under Section 564(b)(1) of the Act, 21 U.S.C. section 360bbb-3(b)(1), unless the authorization is terminated or revoked sooner.  Performed at [facility], [address], [place], [place] [address]      Time coordinating discharge: Over 30 minutes  SIGNED:   Cipriano Bunker, MD  [facility] 07/02/2020, 11:25 AM Pager   If 7PM-7AM, please contact night-coverage [email or web address]

## 2020-07-02 NOTE — Plan of Care (Signed)

## 2020-07-02 NOTE — Care Management (Signed)
Patient admitted to the hospital with acute pancreatitis.  Patient lives at home with his fiance.   Patient denies issues with transportation Patient has been provided with Medication Management  And [facility]  Resources on multiple admissions.   Patient has not followed up with [facility]  Confirmed with Medication Management  That patient has no submitted financial information  Patient notified and provided additional copies of applications

## 2020-07-02 NOTE — Progress Notes (Signed)
   07/02/20 1141  AVS Discharge Documentation  AVS Discharge Instructions Including Medications Provided to patient/caregiver  Name of Person Receiving AVS Discharge Instructions Including Medications Bruce [name]  Name of Clinician That Reviewed AVS Discharge Instructions Including Medications Barb Merino, RN  Went over AVS with patient at this time. Pt denies questions. IV removed. Fluids stopped. Prescriptions handed to patient.

## 2020-08-28 ENCOUNTER — Other Ambulatory Visit: Payer: Self-pay

## 2020-08-28 ENCOUNTER — Encounter: Payer: Self-pay | Admitting: Emergency Medicine

## 2020-08-28 ENCOUNTER — Emergency Department
Admission: EM | Admit: 2020-08-28 | Discharge: 2020-08-28 | Disposition: A | Discharge status: Home / Self Care | Payer: Self-pay | Attending: Emergency Medicine | Admitting: Emergency Medicine

## 2020-08-28 ENCOUNTER — Emergency Department: Payer: Self-pay

## 2020-08-28 DIAGNOSIS — I480 Paroxysmal atrial fibrillation: Secondary | ICD-10-CM | POA: Insufficient documentation

## 2020-08-28 DIAGNOSIS — F10129 Alcohol abuse with intoxication, unspecified: Secondary | ICD-10-CM | POA: Insufficient documentation

## 2020-08-28 DIAGNOSIS — F1410 Cocaine abuse, uncomplicated: Secondary | ICD-10-CM | POA: Insufficient documentation

## 2020-08-28 DIAGNOSIS — F17210 Nicotine dependence, cigarettes, uncomplicated: Secondary | ICD-10-CM | POA: Insufficient documentation

## 2020-08-28 DIAGNOSIS — F10929 Alcohol use, unspecified with intoxication, unspecified: Secondary | ICD-10-CM

## 2020-08-28 DIAGNOSIS — Y908 Blood alcohol level of 240 mg/100 ml or more: Secondary | ICD-10-CM | POA: Insufficient documentation

## 2020-08-28 LAB — CBC WITH DIFFERENTIAL/PLATELET
Abs Immature Granulocytes: 0.03 10*3/uL (ref 0.00–0.07)
Basophils Absolute: 0 10*3/uL (ref 0.0–0.1)
Basophils Relative: 0 %
Eosinophils Absolute: 0 10*3/uL (ref 0.0–0.5)
Eosinophils Relative: 1 %
HCT: 43.5 % (ref 39.0–52.0)
Hemoglobin: 15.1 g/dL (ref 13.0–17.0)
Immature Granulocytes: 1 %
Lymphocytes Relative: 48 %
Lymphs Abs: 2.9 10*3/uL (ref 0.7–4.0)
MCH: 32.3 pg (ref 26.0–34.0)
MCHC: 34.7 g/dL (ref 30.0–36.0)
MCV: 93.1 fL (ref 80.0–100.0)
Monocytes Absolute: 0.8 10*3/uL (ref 0.1–1.0)
Monocytes Relative: 13 %
Neutro Abs: 2.3 10*3/uL (ref 1.7–7.7)
Neutrophils Relative %: 37 %
Platelets: 201 10*3/uL (ref 150–400)
RBC: 4.67 MIL/uL (ref 4.22–5.81)
RDW: 13.9 % (ref 11.5–15.5)
WBC: 6.1 10*3/uL (ref 4.0–10.5)
nRBC: 0 % (ref 0.0–0.2)

## 2020-08-28 LAB — URINE DRUG SCREEN, QUALITATIVE (ARMC ONLY)
Amphetamines, Ur Screen: NOT DETECTED
Barbiturates, Ur Screen: NOT DETECTED
Benzodiazepine, Ur Scrn: NOT DETECTED
Cannabinoid 50 Ng, Ur ~~LOC~~: NOT DETECTED
Cocaine Metabolite,Ur ~~LOC~~: POSITIVE — AB
MDMA (Ecstasy)Ur Screen: NOT DETECTED
Methadone Scn, Ur: NOT DETECTED
Opiate, Ur Screen: NOT DETECTED
Phencyclidine (PCP) Ur S: NOT DETECTED
Tricyclic, Ur Screen: NOT DETECTED

## 2020-08-28 LAB — COMPREHENSIVE METABOLIC PANEL
ALT: 60 U/L — ABNORMAL HIGH (ref 0–44)
AST: 92 U/L — ABNORMAL HIGH (ref 15–41)
Albumin: 3.7 g/dL (ref 3.5–5.0)
Alkaline Phosphatase: 66 U/L (ref 38–126)
Anion gap: 12 (ref 5–15)
BUN: 8 mg/dL (ref 6–20)
CO2: 22 mmol/L (ref 22–32)
Calcium: 8.4 mg/dL — ABNORMAL LOW (ref 8.9–10.3)
Chloride: 106 mmol/L (ref 98–111)
Creatinine, Ser: 1.23 mg/dL (ref 0.61–1.24)
GFR, Estimated: >60 mL/min (ref >60)
Glucose, Bld: 115 mg/dL — ABNORMAL HIGH (ref 70–99)
Potassium: 3.8 mmol/L (ref 3.5–5.1)
Sodium: 140 mmol/L (ref 135–145)
Total Bilirubin: 0.8 mg/dL (ref 0.3–1.2)
Total Protein: 7.5 g/dL (ref 6.5–8.1)

## 2020-08-28 LAB — ACETAMINOPHEN LEVEL: Acetaminophen (Tylenol), Serum: <10 ug/mL — ABNORMAL LOW (ref 10–30)

## 2020-08-28 LAB — ETHANOL: Alcohol, Ethyl (B): 320 mg/dL (ref <10)

## 2020-08-28 LAB — SALICYLATE LEVEL: Salicylate Lvl: <7 mg/dL — ABNORMAL LOW (ref 7.0–30.0)

## 2020-08-28 LAB — TROPONIN I (HIGH SENSITIVITY)
Troponin I (High Sensitivity): 31 ng/L — ABNORMAL HIGH (ref <18)
Troponin I (High Sensitivity): 23 ng/L — ABNORMAL HIGH (ref <18)

## 2020-08-28 LAB — LIPASE, BLOOD: Lipase: 23 U/L (ref 11–51)

## 2020-08-28 MED ORDER — SODIUM CHLORIDE 0.9 % IV BOLUS: 1000.0000 mL | Freq: Once | INTRAVENOUS | Status: DC

## 2020-08-28 MED ORDER — ACETAMINOPHEN 500 MG PO TABS
1000.0000 mg | ORAL_TABLET | Freq: Once | ORAL | Status: AC
  Administered 2020-08-28: 1000 mg via ORAL
  Filled 2020-08-28: qty 2

## 2020-08-28 NOTE — ED Provider Notes (Signed)
[facility] Emergency Department Provider Note  ____________________________________________   First MD Initiated Contact with Patient 08/28/20 [phone]     (approximate)  I have reviewed the triage vital signs and the nursing notes.   HISTORY  Chief Complaint Alcohol Intoxication  Level 5 caveat:  history/ROS limited by acute intoxication  HPI Bruce [name] is a 49 y.o. male with medical history as listed below who presents by EMS for altered mental status.   History is very limited but reportedly the patient has been "partying" by drinking alcohol and is unclear if other substances are involved.  He was with his girlfriend or wife (relationship is unclear).  She called 911 after they had been partying and that she went to the bathroom and came back and he had reportedly fallen or fallen asleep.  He was allegedly unresponsive and according to the first responders he was not breathing.  They bagged him and provided him with 2 mg of Narcan at which point he started breathing again on his own but continued to be essentially unresponsive to EMS.  Initially they found that he was hypotensive at about 93/60.  His fingerstick blood glucose was 171.  They provided some additional 0.5 mg of Narcan by IV and his respiratory rate improved.  Upon arrival to the emergency department he is awake and alert, and awakened when we transferred him from the EMS stretcher to the ED bed.  When asked how he is feeling he laughed and said "fine".  He denies pain or difficulty breathing at this time but is clearly intoxicated and has apparently loss of bowel control.        Past Medical History:  Diagnosis Date  . A-fib (HCC)   . Dental caries   . Hepatitis C infection   . Iritis   . PAF (paroxysmal atrial fibrillation) (HCC)    a. diagnosed 03/19/18; b. CHADS2VASc 0  . Polysubstance abuse (HCC)    a. cocaine, tobacco, etoh  . Scrotal mass   . Tick borne fever   . Uveitis      Patient Active Problem List   Diagnosis Date Noted  . Recurrent pancreatitis 06/29/2020  . Alcohol induced acute pancreatitis without necrosis or infection 05/03/2020  . Polysubstance abuse (HCC)   . Hypokalemia   . Alcohol abuse   . Acute pancreatitis 01/12/2020  . Thrombocytopenia (HCC) 06/25/2019  . Elevated troponin   . Pancreatitis 06/23/2019  . AF (paroxysmal atrial fibrillation) (HCC) 09/13/2018  . Atrial fibrillation (HCC) 03/19/2018  . Abnormality of gait 02/12/2013  . Mandibular fracture (HCC) 12/25/2012  . Flu vaccine need 12/25/2012  . Scrotal mass 05/22/2012  . Dental caries 05/22/2012  . Hepatitis C infection 05/16/2012  . Myalgia 05/06/2012  . Arthralgia 05/06/2012  . Tick borne fever, likely 05/05/2012  . Tinea cruris, likely 05/05/2012  . Spermatocele of epididymis 05/01/2012  . Bilateral varicoceles 05/01/2012  . Bilateral hydrocele 05/01/2012    Past Surgical History:  Procedure Laterality Date  . FINGER SURGERY     infection, middle left  . fractured jaw    . SHOULDER SURGERY     right    Prior to Admission medications   Medication Sig Start Date End Date Taking? Authorizing Provider  amLODipine (NORVASC) 10 MG tablet Take 1 tablet (10 mg total) by mouth daily. 07/03/20   Cipriano Bunker, MD  pantoprazole (PROTONIX) 40 MG tablet Take 1 tablet (40 mg total) by mouth daily. 07/02/20 07/02/21  Cipriano Bunker,  MD    Allergies Penicillins  Family History  Problem Relation Age of Onset  . Drug abuse Father     Social History Social History   Tobacco Use  . Smoking status: Current Some Day Smoker    Packs/day: 0.25    Types: Cigarettes  . Smokeless tobacco: Never Used  . Tobacco comment: Per pt  " I only smoke cigarettes when I drink beer"  Vaping Use  . Vaping Use: Never used  Substance Use Topics  . Alcohol use: Yes    Comment: 6 pack per week  . Drug use: Yes    Types: IV, Cocaine    Comment: Pt states that he last used "a while  back"    Review of Systems Level 5 caveat:  history/ROS limited by acute intoxication ____________________________________________   PHYSICAL EXAM:  VITAL SIGNS: ED Triage Vitals  Enc Vitals Group     BP 08/28/20 0238 110/80     Pulse Rate 08/28/20 0238 92     Resp 08/28/20 0238 18     Temp 08/28/20 0238 97.9 F (36.6 C)     Temp Source 08/28/20 0238 Oral     SpO2 08/28/20 0238 98 %     Weight 08/28/20 0239 74.8 kg (165 lb)     Height 08/28/20 0239 1.702 m (5\' 7" )     Head Circumference --      Peak Flow --      Pain Score 08/28/20 0239 0     Pain Loc --      Pain Edu? --      Excl. in GC? --     Constitutional: Alert and responsive to questions but appears clearly intoxicated. Eyes: Muddy sclera, pupils are small yet reactive. Head: Atraumatic. Nose: No congestion/rhinnorhea. Mouth/Throat: Patient is wearing a mask. Neck: No stridor.  No meningeal signs.   Cardiovascular: Normal rate, regular rhythm. Good peripheral circulation. Grossly normal heart sounds. Respiratory: Normal respiratory effort.  Coarse breath sounds and occasional cough. Gastrointestinal: Soft and nontender. No distention.  Musculoskeletal: No gross abnormalities of the extremities. Neurologic: Slurred speech and language. No gross focal neurologic deficits are appreciated.  Moving all 4 extremities without difficulty. Skin:  Skin is warm, dry and intact.   ____________________________________________   LABS (all labs ordered are listed, but only abnormal results are displayed)  Labs Reviewed  ETHANOL - Abnormal; Notable for the following components:      Result Value   Alcohol, Ethyl (B) 320 (*)    All other components within normal limits  ACETAMINOPHEN LEVEL - Abnormal; Notable for the following components:   Acetaminophen (Tylenol), Serum <10 (*)    All other components within normal limits  SALICYLATE LEVEL - Abnormal; Notable for the following components:   Salicylate Lvl <7.0 (*)     All other components within normal limits  URINE DRUG SCREEN, QUALITATIVE ([facility] ONLY) - Abnormal; Notable for the following components:   Cocaine Metabolite,Ur [place] POSITIVE (*)    All other components within normal limits  COMPREHENSIVE METABOLIC PANEL - Abnormal; Notable for the following components:   Glucose, Bld 115 (*)    Calcium 8.4 (*)    AST 92 (*)    ALT 60 (*)    All other components within normal limits  TROPONIN I (HIGH SENSITIVITY) - Abnormal; Notable for the following components:   Troponin I (High Sensitivity) 31 (*)    All other components within normal limits  TROPONIN I (HIGH SENSITIVITY) - Abnormal; Notable for  the following components:   Troponin I (High Sensitivity) 23 (*)    All other components within normal limits  CBC WITH DIFFERENTIAL/PLATELET  LIPASE, BLOOD   ____________________________________________  EKG  ED ECG REPORT I, Loleta Rose, the attending physician, personally viewed and interpreted this ECG.  Date: 08/28/2020 EKG Time: 2:58 AM Rate: 93 Rhythm: normal sinus rhythm QRS Axis: normal Intervals: normal ST/T Wave abnormalities: Non-specific ST segment / T-wave changes, but no clear evidence of acute ischemia. Narrative Interpretation: no definitive evidence of acute ischemia; does not meet STEMI criteria.   ____________________________________________  RADIOLOGY I, Loleta Rose, personally viewed and evaluated these images (plain radiographs) as part of my medical decision making, as well as reviewing the written report by the radiologist.  ED MD interpretation:  No acute abnormalities identified on CT head and CT cervical spine.  Official radiology report(s): CT Head Wo Contrast  Result Date: 08/28/2020 CLINICAL DATA:  Overdose and fall.  Neck trauma. EXAM: CT HEAD WITHOUT CONTRAST CT CERVICAL SPINE WITHOUT CONTRAST TECHNIQUE: Multidetector CT imaging of the head and cervical spine was performed following the standard protocol  without intravenous contrast. Multiplanar CT image reconstructions of the cervical spine were also generated. COMPARISON:  None. FINDINGS: CT HEAD FINDINGS Brain: No evidence of acute infarction, hemorrhage, hydrocephalus, extra-axial collection or mass lesion/mass effect. Vascular: No hyperdense vessel or unexpected calcification. Skull: Normal. Negative for fracture or focal lesion. Sinuses/Orbits: Paranasal sinuses and mastoid air cells are clear. Other: None. CT CERVICAL SPINE FINDINGS Alignment: Straightening of the usual cervical lordosis with slight anterior subluxation at C4 on C5. This may be due to patient positioning but muscle spasm could also have this appearance. Normal alignment of the facet joints. Skull base and vertebrae: The skull base appears intact. No vertebral compression deformities. No focal bone lesion or bone destruction. Bone cortex appears intact. Soft tissues and spinal canal: No prevertebral soft tissue swelling. No abnormal paraspinal soft tissue mass or infiltration. Disc levels: Degenerative changes with disc space narrowing and anterior osteophyte formation at C5-6 and C6-7 levels. Degenerative changes in the facet joints. Upper chest: Lung apices are clear. Other: None. IMPRESSION: 1. No acute intracranial abnormalities. 2. Nonspecific straightening of usual cervical lordosis with slight anterior subluxation at C4 on C5. This may be due to patient positioning but muscle spasm could also have this appearance. 3. Degenerative changes in the cervical spine. No acute displaced fractures identified. Electronically Signed   By: Burman Nieves M.D.   On: 08/28/2020 03:43   CT Cervical Spine Wo Contrast  Result Date: 08/28/2020 CLINICAL DATA:  Overdose and fall.  Neck trauma. EXAM: CT HEAD WITHOUT CONTRAST CT CERVICAL SPINE WITHOUT CONTRAST TECHNIQUE: Multidetector CT imaging of the head and cervical spine was performed following the standard protocol without intravenous contrast.  Multiplanar CT image reconstructions of the cervical spine were also generated. COMPARISON:  None. FINDINGS: CT HEAD FINDINGS Brain: No evidence of acute infarction, hemorrhage, hydrocephalus, extra-axial collection or mass lesion/mass effect. Vascular: No hyperdense vessel or unexpected calcification. Skull: Normal. Negative for fracture or focal lesion. Sinuses/Orbits: Paranasal sinuses and mastoid air cells are clear. Other: None. CT CERVICAL SPINE FINDINGS Alignment: Straightening of the usual cervical lordosis with slight anterior subluxation at C4 on C5. This may be due to patient positioning but muscle spasm could also have this appearance. Normal alignment of the facet joints. Skull base and vertebrae: The skull base appears intact. No vertebral compression deformities. No focal bone lesion or bone destruction. Bone cortex appears intact.  Soft tissues and spinal canal: No prevertebral soft tissue swelling. No abnormal paraspinal soft tissue mass or infiltration. Disc levels: Degenerative changes with disc space narrowing and anterior osteophyte formation at C5-6 and C6-7 levels. Degenerative changes in the facet joints. Upper chest: Lung apices are clear. Other: None. IMPRESSION: 1. No acute intracranial abnormalities. 2. Nonspecific straightening of usual cervical lordosis with slight anterior subluxation at C4 on C5. This may be due to patient positioning but muscle spasm could also have this appearance. 3. Degenerative changes in the cervical spine. No acute displaced fractures identified. Electronically Signed   By: Burman Nieves M.D.   On: 08/28/2020 03:43   DG Chest Port 1 View  Result Date: 08/28/2020 CLINICAL DATA:  Altered mental status. Overdose. Possible aspiration. EXAM: PORTABLE CHEST 1 VIEW COMPARISON:  03/09/2020 FINDINGS: Heart size and pulmonary vascularity are normal. Lungs are clear. No pleural effusions. No pneumothorax. Mediastinal contours appear intact. Tortuous aorta.  IMPRESSION: No active disease. Electronically Signed   By: Burman Nieves M.D.   On: 08/28/2020 03:21    ____________________________________________   PROCEDURES   Procedure(s) performed (including Critical Care):  Procedures   ____________________________________________   INITIAL IMPRESSION / MDM / ASSESSMENT AND PLAN / ED COURSE  As part of my medical decision making, I reviewed the following data within the electronic MEDICAL RECORD NUMBER Nursing notes reviewed and incorporated, Labs reviewed , EKG interpreted , Old chart reviewed, Patient signed out to Dr. Adaline Sill, Radiograph reviewed , Notes from prior ED visits and [place] Controlled Substance Database   Differential diagnosis includes, but is not limited to, alcohol intoxication, other substance abuse, electrolyte or metabolic abnormality, intracranial bleed due to trauma or other unspecified reason.  Patient has extensive past medical history but also history of polysubstance abuse and alcohol abuse.  He is not complaining of anything right now and is protecting his airway.  He does have occasional cough and coarse breath sounds and I wonder about aspiration.  We will watch him carefully on the pulse oximeter and the cardiac monitor in case he needs additional Narcan.  Lab work is pending.  His blood pressure improved after 800 mL normal saline by EMS.  The initially I had ordered a liter bolus but since he is normotensive at this time and received substantial fluids by EMS I will not provide any additional fluids.  Given the altered mental status and the questionable history of a fall, I am obtaining a CT of the head and cervical spine.  Given the possible aspiration I am ordering a chest x-ray.  EKG and lab work are pending.      Clinical Course as of Aug 28 728  Fri Aug 28, 2020  9675 Lab work is notable for an essentially normal CMP except for mildly elevated LFTs which are similar though slightly higher than baseline.   High-sensitivity troponin is 31 but he has had elevated troponins in the past.  No evidence of coingestions with negative salicylate and acetaminophen levels.  Lipase is normal, CBC is normal including the hemoglobin and white blood cell count.  His ethanol level is 320.   [CF]  0427 Alcohol, Ethyl (B)(!!): 320 [CF]  0427 I personally reviewed the head CT and cervical spine CT and agree with the radiologist interpretation that there is no evidence of acute injury.   [CF]  0529 Troponin I (High Sensitivity)(!): 23 [CF]  0549 Cocaine Metabolite,Ur [place](!): POSITIVE [CF]    Clinical Course User Index [CF] Loleta Rose, MD  ____________________________________________  FINAL CLINICAL IMPRESSION(S) / ED DIAGNOSES  Final diagnoses:  Alcoholic intoxication with complication (HCC)  Cocaine abuse (HCC)     MEDICATIONS GIVEN DURING THIS VISIT:  Medications - No data to display   ED Discharge Orders    None      *Please note:  Bruce [name] was evaluated in Emergency Department on 08/28/2020 for the symptoms described in the history of present illness. He was evaluated in the context of the global COVID-19 pandemic, which necessitated consideration that the patient might be at risk for infection with the SARS-CoV-2 virus that causes COVID-19. Institutional protocols and algorithms that pertain to the evaluation of patients at risk for COVID-19 are in a state of rapid change based on information released by regulatory bodies including the CDC and federal and state organizations. These policies and algorithms were followed during the patient's care in the ED.  Some ED evaluations and interventions may be delayed as a result of limited staffing during and after the pandemic.*  Note:  This document was prepared using Dragon voice recognition software and may include unintentional dictation errors.   Loleta Rose, MD 08/28/20 [phone]

## 2020-08-28 NOTE — ED Triage Notes (Signed)
Pt to ED via EMS from home. Pt was drinking alcohol and fell to the floor. Pt was found unresponsive by EMS. Pt was given 0.5mg  of Narcan. Pt RR and 02 sat was normal. PT was hypotensive (93/60) CBG: 171. Pt arrived to ED alert. Pt denies pain.

## 2020-08-28 NOTE — ED Notes (Signed)
Pt given ice.  

## 2020-08-28 NOTE — ED Notes (Signed)
Patient ambulated without difficulty. Denies pain.

## 2020-08-28 NOTE — ED Notes (Signed)
Pt cleaned of stool at thsi time

## 2020-08-28 NOTE — ED Notes (Signed)
Pt to ct 

## 2020-08-28 NOTE — ED Notes (Signed)
Pt has breakfast at bedside, pt using phone. Denies further needs currently. Awaiting disposition from ED provider.

## 2020-08-28 NOTE — Discharge Instructions (Signed)
Please continue to take your regular prescription medications and abstain from alcohol and cocaine use.  Return to the ED with any worsening symptoms.

## 2020-08-28 NOTE — ED Notes (Signed)
Patient does not have clothing. Provided Paper scrubs and no slip socks to wear for discharge.

## 2020-08-28 NOTE — ED Notes (Signed)
Patient does not have ride home. Provided phone to call for ride. Advised that he will have to wait in waiting room for a ride home. Stable condition with normal vital signs.Alert and oriented.

## 2020-09-28 ENCOUNTER — Other Ambulatory Visit: Payer: Self-pay

## 2020-09-28 ENCOUNTER — Encounter: Payer: Self-pay | Admitting: Emergency Medicine

## 2020-09-28 ENCOUNTER — Inpatient Hospital Stay
Admission: EM | Admit: 2020-09-28 | Discharge: 2020-10-01 | DRG: 439 | Disposition: A | Discharge status: Home / Self Care | Payer: Self-pay | Attending: Internal Medicine | Admitting: Internal Medicine

## 2020-09-28 DIAGNOSIS — I491 Atrial premature depolarization: Secondary | ICD-10-CM | POA: Diagnosis present

## 2020-09-28 DIAGNOSIS — R7401 Elevation of levels of liver transaminase levels: Secondary | ICD-10-CM | POA: Diagnosis present

## 2020-09-28 DIAGNOSIS — R000 Tachycardia, unspecified: Secondary | ICD-10-CM | POA: Diagnosis present

## 2020-09-28 DIAGNOSIS — R350 Frequency of micturition: Secondary | ICD-10-CM | POA: Diagnosis present

## 2020-09-28 DIAGNOSIS — Z20822 Contact with and (suspected) exposure to covid-19: Secondary | ICD-10-CM | POA: Diagnosis present

## 2020-09-28 DIAGNOSIS — K219 Gastro-esophageal reflux disease without esophagitis: Secondary | ICD-10-CM | POA: Diagnosis present

## 2020-09-28 DIAGNOSIS — F1910 Other psychoactive substance abuse, uncomplicated: Secondary | ICD-10-CM

## 2020-09-28 DIAGNOSIS — I10 Essential (primary) hypertension: Secondary | ICD-10-CM | POA: Diagnosis present

## 2020-09-28 DIAGNOSIS — Z813 Family history of other psychoactive substance abuse and dependence: Secondary | ICD-10-CM

## 2020-09-28 DIAGNOSIS — N401 Enlarged prostate with lower urinary tract symptoms: Secondary | ICD-10-CM | POA: Diagnosis present

## 2020-09-28 DIAGNOSIS — K852 Alcohol induced acute pancreatitis without necrosis or infection: Principal | ICD-10-CM | POA: Diagnosis present

## 2020-09-28 DIAGNOSIS — Z8619 Personal history of other infectious and parasitic diseases: Secondary | ICD-10-CM

## 2020-09-28 DIAGNOSIS — Z23 Encounter for immunization: Secondary | ICD-10-CM

## 2020-09-28 DIAGNOSIS — I480 Paroxysmal atrial fibrillation: Secondary | ICD-10-CM | POA: Diagnosis present

## 2020-09-28 DIAGNOSIS — F10239 Alcohol dependence with withdrawal, unspecified: Secondary | ICD-10-CM | POA: Diagnosis present

## 2020-09-28 DIAGNOSIS — Z880 Allergy status to penicillin: Secondary | ICD-10-CM

## 2020-09-28 DIAGNOSIS — F17210 Nicotine dependence, cigarettes, uncomplicated: Secondary | ICD-10-CM | POA: Diagnosis present

## 2020-09-28 DIAGNOSIS — F1410 Cocaine abuse, uncomplicated: Secondary | ICD-10-CM | POA: Diagnosis present

## 2020-09-28 DIAGNOSIS — R112 Nausea with vomiting, unspecified: Secondary | ICD-10-CM

## 2020-09-28 LAB — COMPREHENSIVE METABOLIC PANEL
ALT: 120 U/L — ABNORMAL HIGH (ref 0–44)
AST: 101 U/L — ABNORMAL HIGH (ref 15–41)
Albumin: 4.4 g/dL (ref 3.5–5.0)
Alkaline Phosphatase: 86 U/L (ref 38–126)
Anion gap: 7 (ref 5–15)
BUN: 7 mg/dL (ref 6–20)
CO2: 24 mmol/L (ref 22–32)
Calcium: 9.6 mg/dL (ref 8.9–10.3)
Chloride: 110 mmol/L (ref 98–111)
Creatinine, Ser: 0.76 mg/dL (ref 0.61–1.24)
GFR, Estimated: >60 mL/min (ref >60)
Glucose, Bld: 115 mg/dL — ABNORMAL HIGH (ref 70–99)
Potassium: 3.7 mmol/L (ref 3.5–5.1)
Sodium: 141 mmol/L (ref 135–145)
Total Bilirubin: 1 mg/dL (ref 0.3–1.2)
Total Protein: 8.7 g/dL — ABNORMAL HIGH (ref 6.5–8.1)

## 2020-09-28 LAB — CBC
HCT: 45.3 % (ref 39.0–52.0)
Hemoglobin: 16.2 g/dL (ref 13.0–17.0)
MCH: 32.3 pg (ref 26.0–34.0)
MCHC: 35.8 g/dL (ref 30.0–36.0)
MCV: 90.2 fL (ref 80.0–100.0)
Platelets: 217 10*3/uL (ref 150–400)
RBC: 5.02 MIL/uL (ref 4.22–5.81)
RDW: 13 % (ref 11.5–15.5)
WBC: 4.9 10*3/uL (ref 4.0–10.5)
nRBC: 0 % (ref 0.0–0.2)

## 2020-09-28 LAB — URINALYSIS, COMPLETE (UACMP) WITH MICROSCOPIC
Bilirubin Urine: NEGATIVE
Glucose, UA: NEGATIVE mg/dL
Hgb urine dipstick: NEGATIVE
Ketones, ur: 5 mg/dL — AB
Leukocytes,Ua: NEGATIVE
Nitrite: NEGATIVE
Protein, ur: 30 mg/dL — AB
Specific Gravity, Urine: 1.014 (ref 1.005–1.030)
pH: 5 (ref 5.0–8.0)

## 2020-09-28 LAB — LIPASE, BLOOD: Lipase: 231 U/L — ABNORMAL HIGH (ref 11–51)

## 2020-09-28 LAB — LACTIC ACID, PLASMA: Lactic Acid, Venous: 1.8 mmol/L (ref 0.5–1.9)

## 2020-09-28 MED ORDER — ONDANSETRON HCL 4 MG/2ML IJ SOLN
4.0000 mg | Freq: Once | INTRAMUSCULAR | Status: AC
  Administered 2020-09-28: 4 mg via INTRAVENOUS
  Filled 2020-09-28: qty 2

## 2020-09-28 MED ORDER — HYDROMORPHONE HCL 1 MG/ML IJ SOLN
1.0000 mg | Freq: Once | INTRAMUSCULAR | Status: AC
  Administered 2020-09-28: 1 mg via INTRAVENOUS
  Filled 2020-09-28: qty 1

## 2020-09-28 MED ORDER — SODIUM CHLORIDE 0.9 % IV BOLUS
1000.0000 mL | Freq: Once | INTRAVENOUS | Status: AC
  Administered 2020-09-28: 1000 mL via INTRAVENOUS

## 2020-09-28 MED ORDER — ONDANSETRON 4 MG PO TBDP
4.0000 mg | ORAL_TABLET | Freq: Once | ORAL | Status: AC | PRN
  Administered 2020-09-28: 4 mg via ORAL
  Filled 2020-09-28: qty 1

## 2020-09-28 MED ORDER — DIPHENHYDRAMINE HCL 50 MG/ML IJ SOLN
25.0000 mg | Freq: Once | INTRAMUSCULAR | Status: AC
  Administered 2020-09-28: 25 mg via INTRAVENOUS

## 2020-09-28 MED ORDER — DIPHENHYDRAMINE HCL 50 MG/ML IJ SOLN
INTRAMUSCULAR | Status: AC
  Filled 2020-09-28: qty 1

## 2020-09-28 MED ORDER — PROMETHAZINE HCL 25 MG/ML IJ SOLN
12.5000 mg | Freq: Once | INTRAMUSCULAR | Status: AC
  Administered 2020-09-28: 12.5 mg via INTRAVENOUS
  Filled 2020-09-28: qty 1

## 2020-09-28 NOTE — ED Notes (Signed)
ED Provider at bedside. 

## 2020-09-28 NOTE — ED Notes (Signed)
Family at bedside. 

## 2020-09-28 NOTE — ED Provider Notes (Addendum)
[facility] Emergency Department Provider Note ____________________________________________   First MD Initiated Contact with Patient 09/28/20 2139     (approximate)  I have reviewed the triage vital signs and the nursing notes.   HISTORY  Chief Complaint Abdominal Pain    HPI Bruce [name] is a 49 y.o. male with PMH as noted below including history of pancreatitis who presents with abdominal pain, acute onset earlier today, persistent course, and affecting his whole abdomen.  It is worst in the upper abdomen.  It is associated with nausea and multiple episodes of vomiting.  Patient states that he has not been drinking in a while, but did have 4 beers last night.  He states it feels similar to a prior episode of pancreatitis.  Past Medical History:  Diagnosis Date  . A-fib (HCC)   . Dental caries   . Hepatitis C infection   . Iritis   . PAF (paroxysmal atrial fibrillation) (HCC)    a. diagnosed 03/19/18; b. CHADS2VASc 0  . Polysubstance abuse (HCC)    a. cocaine, tobacco, etoh  . Scrotal mass   . Tick borne fever   . Uveitis     Patient Active Problem List   Diagnosis Date Noted  . Recurrent pancreatitis 06/29/2020  . Alcohol induced acute pancreatitis without necrosis or infection 05/03/2020  . Polysubstance abuse (HCC)   . Hypokalemia   . Alcohol abuse   . Acute pancreatitis 01/12/2020  . Thrombocytopenia (HCC) 06/25/2019  . Elevated troponin   . Pancreatitis 06/23/2019  . AF (paroxysmal atrial fibrillation) (HCC) 09/13/2018  . Atrial fibrillation (HCC) 03/19/2018  . Abnormality of gait 02/12/2013  . Mandibular fracture (HCC) 12/25/2012  . Flu vaccine need 12/25/2012  . Scrotal mass 05/22/2012  . Dental caries 05/22/2012  . Hepatitis C infection 05/16/2012  . Myalgia 05/06/2012  . Arthralgia 05/06/2012  . Tick borne fever, likely 05/05/2012  . Tinea cruris, likely 05/05/2012  . Spermatocele of epididymis 05/01/2012  . Bilateral  varicoceles 05/01/2012  . Bilateral hydrocele 05/01/2012    Past Surgical History:  Procedure Laterality Date  . FINGER SURGERY     infection, middle left  . fractured jaw    . SHOULDER SURGERY     right    Prior to Admission medications   Medication Sig Start Date End Date Taking? Authorizing Provider  amLODipine (NORVASC) 10 MG tablet Take 1 tablet (10 mg total) by mouth daily. 07/03/20   Cipriano Bunker, MD  pantoprazole (PROTONIX) 40 MG tablet Take 1 tablet (40 mg total) by mouth daily. 07/02/20 07/02/21  Cipriano Bunker, MD    Allergies Penicillins  Family History  Problem Relation Age of Onset  . Drug abuse Father     Social History Social History   Tobacco Use  . Smoking status: Current Some Day Smoker    Packs/day: 0.25    Types: Cigarettes  . Smokeless tobacco: Never Used  . Tobacco comment: Per pt  " I only smoke cigarettes when I drink beer"  Vaping Use  . Vaping Use: Never used  Substance Use Topics  . Alcohol use: Yes    Comment: 6 pack per week  . Drug use: Yes    Types: IV, Cocaine    Comment: Pt states that he last used "a while back"    Review of Systems  Constitutional: No fever. Eyes: No redness. ENT: No sore throat. Cardiovascular: Denies chest pain. Respiratory: Denies shortness of breath. Gastrointestinal: Positive for vomiting. Genitourinary: Negative  for flank pain. Musculoskeletal: Negative for back pain. Skin: Negative for rash. Neurological: Negative for headache.   ____________________________________________   PHYSICAL EXAM:  VITAL SIGNS: ED Triage Vitals  Enc Vitals Group     BP 09/28/20 1830 (!) 142/96     Pulse Rate 09/28/20 1830 (!) 109     Resp 09/28/20 1830 18     Temp 09/28/20 1835 98.2 F (36.8 C)     Temp Source 09/28/20 1835 Oral     SpO2 09/28/20 1830 98 %     Weight 09/28/20 1830 165 lb (74.8 kg)     Height 09/28/20 1830 5\' 7"  (1.702 m)     Head Circumference --      Peak Flow --      Pain Score  09/28/20 1835 10     Pain Loc --      Pain Edu? --      Excl. in GC? --     Constitutional: Alert and oriented.  Uncomfortable appearing in no acute distress. Eyes: Conjunctivae are normal.  No scleral icterus. Head: Atraumatic. Nose: No congestion/rhinnorhea. Mouth/Throat: Mucous membranes are moist.   Neck: Normal range of motion.  Cardiovascular: Good peripheral circulation. Respiratory: Normal respiratory effort.  No retractions.  Gastrointestinal: Soft with mild diffuse tenderness, mostly in the epigastric area.  No distention.  Genitourinary: No flank tenderness. Musculoskeletal: Extremities warm and well perfused.  Neurologic:  Normal speech and language. No gross focal neurologic deficits are appreciated.  Skin:  Skin is warm and dry. No rash noted. Psychiatric: Mood and affect are normal. Speech and behavior are normal.  ____________________________________________   LABS (all labs ordered are listed, but only abnormal results are displayed)  Labs Reviewed  LIPASE, BLOOD - Abnormal; Notable for the following components:      Result Value   Lipase 231 (*)    All other components within normal limits  COMPREHENSIVE METABOLIC PANEL - Abnormal; Notable for the following components:   Glucose, Bld 115 (*)    Total Protein 8.7 (*)    AST 101 (*)    ALT 120 (*)    All other components within normal limits  RESP PANEL BY RT-PCR (FLU A&B, COVID) ARPGX2  CBC  LACTIC ACID, PLASMA  URINALYSIS, COMPLETE (UACMP) WITH MICROSCOPIC  LACTIC ACID, PLASMA   ____________________________________________  EKG  ED ECG REPORT I, 09/30/20, the attending physician, personally viewed and interpreted this ECG.  Date: 09/28/2020 EKG Time: 1832 Rate: 102 Rhythm: normal sinus rhythm QRS Axis: normal Intervals: normal ST/T Wave abnormalities: Nonspecific abnormalities Narrative Interpretation: no evidence of acute  ischemia  ____________________________________________  RADIOLOGY    ____________________________________________   PROCEDURES  Procedure(s) performed: No  Procedures  Critical Care performed: No ____________________________________________   INITIAL IMPRESSION / ASSESSMENT AND PLAN / ED COURSE  Pertinent labs & imaging results that were available during my care of the patient were reviewed by me and considered in my medical decision making (see chart for details).  49 year old male with PMH as noted above including alcohol abuse and a prior episode of pancreatitis presents with abdominal pain and vomiting today, which he feels is similar to when he had pancreatitis.  He drank 4 beers last night although states he otherwise has not been drinking recently.  I reviewed the past medical records in Epic.  The patient was most recently admitted in August with a similar presentation, lipase in the 300s and CT showing uncomplicated pancreatitis.  Symptoms were refractory to pain and nausea medications  in the ED.  On exam today, the patient is uncomfortable appearing, actively vomiting intermittently.  His vital signs are normal except for hypertension.  He has diffuse tenderness to the abdomen although mostly in the epigastric area.  Lab work-up obtained from triage shows a lipase of 231, and elevated LFTs.  His LFTs have been slightly elevated in the past as well.  Overall presentation is most consistent with acute pancreatitis.  We will give fluids, analgesia, and antiemetics and reassess.  If the patient's pain is controlled and he is tolerating p.o., there is some possibility the patient could be discharged.  However, I suspect that he may need admission.  ----------------------------------------- 11:20 PM on 09/28/2020 -----------------------------------------  The patient reports improved nausea and slight improvement in his pain.  Plan will be to reassess and if he has  refractory pain he may need admission.  I signed the patient out to the oncoming physician Dr. Dolores Frame.    ____________________________________________   FINAL CLINICAL IMPRESSION(S) / ED DIAGNOSES  Final diagnoses:  Alcohol-induced acute pancreatitis, unspecified complication status      NEW MEDICATIONS STARTED DURING THIS VISIT:  New Prescriptions   No medications on file     Note:  This document was prepared using Dragon voice recognition software and may include unintentional dictation errors.    Dionne Bucy, MD 09/28/20 0156    Dionne Bucy, MD 09/28/20 2321

## 2020-09-28 NOTE — ED Provider Notes (Signed)
-----------------------------------------   11:36 PM on 09/28/2020 -----------------------------------------  Reassessed patient who is still complaining of significant pain, nausea and vomiting.  Will redose Dilaudid, administer IV Phenergan.  I personally reviewed patient's chart and see that he had an abdominal ultrasound in June 2021 and CT abdomen/pelvis in 06/2020 which were both unremarkable.  Last hospitalized in August 2021 for alcohol induced pancreatitis.  Admits to 4 beers tonight.  Will discuss with hospital services for admission.   Irean Hong, MD 09/29/20 [phone]

## 2020-09-28 NOTE — ED Triage Notes (Addendum)
Pt via POV from home. States he has had generalized abdominal pain since this morning. Pt states he has been vomiting and having diarrhea. Denies any abdominal surgeries. Pt is A&Ox4. Pt is dry heaving during triage and seems to be uncomfortable. Pt requesting medication for nausea. Pt has a hx of pancreatitis and states he drank 4 cans of beer last night.

## 2020-09-29 DIAGNOSIS — K861 Other chronic pancreatitis: Secondary | ICD-10-CM

## 2020-09-29 DIAGNOSIS — K859 Acute pancreatitis without necrosis or infection, unspecified: Secondary | ICD-10-CM

## 2020-09-29 LAB — COMPREHENSIVE METABOLIC PANEL
ALT: 93 U/L — ABNORMAL HIGH (ref 0–44)
AST: 71 U/L — ABNORMAL HIGH (ref 15–41)
Albumin: 3.8 g/dL (ref 3.5–5.0)
Alkaline Phosphatase: 68 U/L (ref 38–126)
Anion gap: 9 (ref 5–15)
BUN: 7 mg/dL (ref 6–20)
CO2: 26 mmol/L (ref 22–32)
Calcium: 9 mg/dL (ref 8.9–10.3)
Chloride: 108 mmol/L (ref 98–111)
Creatinine, Ser: 0.78 mg/dL (ref 0.61–1.24)
GFR, Estimated: >60 mL/min (ref >60)
Glucose, Bld: 104 mg/dL — ABNORMAL HIGH (ref 70–99)
Potassium: 3.5 mmol/L (ref 3.5–5.1)
Sodium: 143 mmol/L (ref 135–145)
Total Bilirubin: 1.4 mg/dL — ABNORMAL HIGH (ref 0.3–1.2)
Total Protein: 7.5 g/dL (ref 6.5–8.1)

## 2020-09-29 LAB — CBC
HCT: 40.3 % (ref 39.0–52.0)
Hemoglobin: 14.4 g/dL (ref 13.0–17.0)
MCH: 32.1 pg (ref 26.0–34.0)
MCHC: 35.7 g/dL (ref 30.0–36.0)
MCV: 90 fL (ref 80.0–100.0)
Platelets: 178 10*3/uL (ref 150–400)
RBC: 4.48 MIL/uL (ref 4.22–5.81)
RDW: 13 % (ref 11.5–15.5)
WBC: 3.9 10*3/uL — ABNORMAL LOW (ref 4.0–10.5)
nRBC: 0 % (ref 0.0–0.2)

## 2020-09-29 LAB — RESP PANEL BY RT-PCR (FLU A&B, COVID) ARPGX2
Influenza A by PCR: NEGATIVE
Influenza B by PCR: NEGATIVE
SARS Coronavirus 2 by RT PCR: NEGATIVE

## 2020-09-29 LAB — LIPASE, BLOOD: Lipase: 156 U/L — ABNORMAL HIGH (ref 11–51)

## 2020-09-29 LAB — LACTIC ACID, PLASMA: Lactic Acid, Venous: 1.4 mmol/L (ref 0.5–1.9)

## 2020-09-29 LAB — ETHANOL: Alcohol, Ethyl (B): <10 mg/dL (ref <10)

## 2020-09-29 MED ORDER — PANTOPRAZOLE SODIUM 40 MG PO TBEC
40.0000 mg | DELAYED_RELEASE_TABLET | Freq: Every day | ORAL | Status: DC
  Administered 2020-09-29 – 2020-10-01 (×3): 40 mg via ORAL
  Filled 2020-09-29 (×3): qty 1

## 2020-09-29 MED ORDER — SODIUM CHLORIDE 0.9 % IV SOLN: INTRAVENOUS | Status: DC

## 2020-09-29 MED ORDER — MAGNESIUM HYDROXIDE 400 MG/5ML PO SUSP: 30.0000 mL | Freq: Every day | ORAL | Status: DC | PRN

## 2020-09-29 MED ORDER — KETOROLAC TROMETHAMINE 15 MG/ML IJ SOLN
15.0000 mg | Freq: Four times a day (QID) | INTRAMUSCULAR | Status: DC | PRN
  Administered 2020-09-29 – 2020-10-01 (×8): 15 mg via INTRAVENOUS
  Filled 2020-09-29 (×8): qty 1

## 2020-09-29 MED ORDER — ONDANSETRON HCL 4 MG PO TABS: 4.0000 mg | ORAL_TABLET | Freq: Four times a day (QID) | ORAL | Status: DC | PRN

## 2020-09-29 MED ORDER — PNEUMOCOCCAL VAC POLYVALENT 25 MCG/0.5ML IJ INJ
0.5000 mL | INJECTION | INTRAMUSCULAR | Status: AC
  Administered 2020-10-01: 0.5 mL via INTRAMUSCULAR
  Filled 2020-09-29: qty 0.5

## 2020-09-29 MED ORDER — ENOXAPARIN SODIUM 40 MG/0.4ML ~~LOC~~ SOLN
40.0000 mg | SUBCUTANEOUS | Status: DC
  Administered 2020-09-29 – 2020-09-30 (×2): 40 mg via SUBCUTANEOUS
  Filled 2020-09-29 (×3): qty 0.4

## 2020-09-29 MED ORDER — AMLODIPINE BESYLATE 10 MG PO TABS
10.0000 mg | ORAL_TABLET | Freq: Every day | ORAL | Status: DC
  Administered 2020-09-29 – 2020-10-01 (×3): 10 mg via ORAL
  Filled 2020-09-29 (×3): qty 1

## 2020-09-29 MED ORDER — ONDANSETRON HCL 4 MG/2ML IJ SOLN
4.0000 mg | Freq: Four times a day (QID) | INTRAMUSCULAR | Status: DC | PRN
  Administered 2020-10-01: 4 mg via INTRAVENOUS
  Filled 2020-09-29: qty 2

## 2020-09-29 MED ORDER — LORAZEPAM 2 MG/ML IJ SOLN: 1.0000 mg | INTRAMUSCULAR | Status: DC | PRN

## 2020-09-29 MED ORDER — ACETAMINOPHEN 650 MG RE SUPP: 650.0000 mg | Freq: Four times a day (QID) | RECTAL | Status: DC | PRN

## 2020-09-29 MED ORDER — MORPHINE SULFATE (PF) 2 MG/ML IV SOLN
2.0000 mg | INTRAVENOUS | Status: DC | PRN
  Administered 2020-09-29 – 2020-09-30 (×9): 2 mg via INTRAVENOUS
  Filled 2020-09-29 (×9): qty 1

## 2020-09-29 MED ORDER — TRAZODONE HCL 50 MG PO TABS: 25.0000 mg | ORAL_TABLET | Freq: Every evening | ORAL | Status: DC | PRN

## 2020-09-29 MED ORDER — ACETAMINOPHEN 325 MG PO TABS: 650.0000 mg | ORAL_TABLET | Freq: Four times a day (QID) | ORAL | Status: DC | PRN

## 2020-09-29 MED ORDER — INFLUENZA VAC SPLIT QUAD 0.5 ML IM SUSY
0.5000 mL | PREFILLED_SYRINGE | INTRAMUSCULAR | Status: AC
  Administered 2020-10-01: 0.5 mL via INTRAMUSCULAR
  Filled 2020-09-29: qty 0.5

## 2020-09-29 MED ORDER — THIAMINE HCL 100 MG/ML IJ SOLN
Freq: Once | INTRAVENOUS | Status: AC
  Filled 2020-09-29: qty 1000

## 2020-09-29 NOTE — Progress Notes (Signed)
Same day rounding progress note  Bruce [name]  is a 49 y.o. male with a known history of polysubstance abuse including cocaine, tobacco and alcohol, pancreatitis, atrial fibrillation and hepatitis C is admitted for acute alcoholic pancreatitis  Acute on chronic alcoholic pancreatitis Improving amylase and lipase, continue close monitoring We will start clear liquid diet.  Advance diet as tolerated tomorrow Continue banana bag for alcohol withdrawal symptoms  Essential hypertension Continue amlodipine  GERD Continue PPI  Polysubstance abuse Counseled for cessation  Patient's family/mother was at bedside and was updated today on 11/23  Time spent : 20 minutes

## 2020-09-29 NOTE — ED Notes (Signed)
Report called to receiving RN on inpatient floor.

## 2020-09-29 NOTE — H&P (Addendum)
PATIENT NAME: Bruce [name]    MR#:  [id number]  DATE OF BIRTH:  [birth year]  DATE OF ADMISSION:  09/28/2020  PRIMARY CARE PHYSICIAN: Patient, No Pcp Per   REQUESTING/REFERRING PHYSICIAN: Chiquita Loth, MD  CHIEF COMPLAINT:   Chief Complaint  Patient presents with  . Abdominal Pain    HISTORY OF PRESENT ILLNESS:  Bruce [name]  is a 49 y.o. male with a known history of polysubstance abuse including cocaine, tobacco and alcohol, pancreatitis, atrial fibrillation and hepatitis C, who presented to the emergency room with acute onset of generalized abdominal pain mainly in the left upper quadrant and epigastric area with associated nausea and vomiting since this morning.  He denies any fever or chills.  His last alcoholic drink was for beer last night.  He denies any cough or wheezing or dyspnea.  No fever or chills.  No chest pain or palpitations.  He has been having urinary frequency without dysuria, oliguria or hematuria or flank pain. The patient was most recently admitted in August with a similar presentation, lipase in the 300s and CT showing uncomplicated pancreatitis.  Upon presentation to the emergency room, blood pressure was 142/96 with a pulse of 109 with otherwise normal vital signs.  Labs revealed serum lipase of 231 with AST 101 AST of 122 obtained 8.7 and albumin 4.4.  Lactic acid was 1.8 and later 1.4 CBC showed no significant abnormality.  Influenza AMB antigen ischemic negative as well as COVID-19 PCR.  Chest x-ray showed no acute cardiopulmonary disease. EKG showed sinus tachycardia with rate 102 with premature atrial complexes and poor R wave progression.  The patient was given IV Zofran and Phenergan as well as Dilaudid had itching for which he was given 25 mg of IV Benadryl.  He was given 2 L bolus of IV normal saline.  He will be admitted to a medical bed for further evaluation and management. PAST MEDICAL HISTORY:   Past Medical History:  Diagnosis  Date  . A-fib (HCC)   . Dental caries   . Hepatitis C infection   . Iritis   . PAF (paroxysmal atrial fibrillation) (HCC)    a. diagnosed 03/19/18; b. CHADS2VASc 0  . Polysubstance abuse (HCC)    a. cocaine, tobacco, etoh  . Scrotal mass   . Tick borne fever   . Uveitis     PAST SURGICAL HISTORY:   Past Surgical History:  Procedure Laterality Date  . FINGER SURGERY     infection, middle left  . fractured jaw    . SHOULDER SURGERY     right    SOCIAL HISTORY:   Social History   Tobacco Use  . Smoking status: Current Some Day Smoker    Packs/day: 0.25    Types: Cigarettes  . Smokeless tobacco: Never Used  . Tobacco comment: Per pt  " I only smoke cigarettes when I drink beer"  Substance Use Topics  . Alcohol use: Yes    Comment: 6 pack per week    FAMILY HISTORY:   Family History  Problem Relation Age of Onset  . Drug abuse Father     DRUG ALLERGIES:   Allergies  Allergen Reactions  . Penicillins Other (See Comments)    Did it involve swelling of the face/tongue/throat, SOB, or low BP? No Did it involve sudden or severe rash/hives, skin peeling, or any reaction on the inside of your mouth or nose? No Did you need to seek medical  attention at a hospital or doctor's office? No When did it last happen? If all above answers are "NO", may proceed with cephalosporin use.    REVIEW OF SYSTEMS:   ROS As per history of present illness. All pertinent systems were reviewed above. Constitutional, HEENT, cardiovascular, respiratory, GI, GU, musculoskeletal, neuro, psychiatric, endocrine, integumentary and hematologic systems were reviewed and are otherwise negative/unremarkable except for positive findings mentioned above in the HPI.   MEDICATIONS AT HOME:   Prior to Admission medications   Medication Sig Start Date End Date Taking? Authorizing Provider  amLODipine (NORVASC) 10 MG tablet Take 1 tablet (10 mg total) by mouth daily. Patient not taking:  Reported on 09/29/2020 07/03/20   Cipriano Bunker, MD  pantoprazole (PROTONIX) 40 MG tablet Take 1 tablet (40 mg total) by mouth daily. Patient not taking: Reported on 09/29/2020 07/02/20 07/02/21  Cipriano Bunker, MD      VITAL SIGNS:  Blood pressure (!) 163/101, pulse 66, temperature 98.6 F (37 C), temperature source Oral, resp. rate 17, height 5\' 7"  (1.702 m), weight 74.8 kg, SpO2 100 %.  PHYSICAL EXAMINATION:  Physical Exam  GENERAL:  49 y.o.-year-old African-American male patient lying in the bed with no acute distress.  EYES: Pupils equal, round, reactive to light and accommodation. No scleral icterus. Extraocular muscles intact.  HEENT: Head atraumatic, normocephalic. Oropharynx and nasopharynx clear.  NECK:  Supple, no jugular venous distention. No thyroid enlargement, no tenderness.  LUNGS: Normal breath sounds bilaterally, no wheezing, rales,rhonchi or crepitation. No use of accessory muscles of respiration.  CARDIOVASCULAR: Regular rate and rhythm, S1, S2 normal. No murmurs, rubs, or gallops.  ABDOMEN: Soft, nondistended with epigastric and left upper quadrant tenderness without rebound tenderness guarding or rigidity.  Bowel sounds present. No organomegaly or mass.  EXTREMITIES: No pedal edema, cyanosis, or clubbing.  NEUROLOGIC: Cranial nerves II through XII are intact. Muscle strength 5/5 in all extremities. Sensation intact. Gait not checked.  PSYCHIATRIC: The patient is alert and oriented x 3.  Normal affect and good eye contact. SKIN: No obvious rash, lesion, or ulcer.   LABORATORY PANEL:   CBC Recent Labs  Lab 09/28/20 1851  WBC 4.9  HGB 16.2  HCT 45.3  PLT 217   ------------------------------------------------------------------------------------------------------------------  Chemistries  Recent Labs  Lab 09/28/20 1851  NA 141  K 3.7  CL 110  CO2 24  GLUCOSE 115*  BUN 7  CREATININE 0.76  CALCIUM 9.6  AST 101*  ALT 120*  ALKPHOS 86  BILITOT 1.0    ------------------------------------------------------------------------------------------------------------------  Cardiac Enzymes No results for input(s): TROPONINI in the last 168 hours. ------------------------------------------------------------------------------------------------------------------  RADIOLOGY:  No results found.    IMPRESSION AND PLAN:   1.  Acute alcoholic pancreatitis. -The patient will be admitted to a medical bed. -He will be kept n.p.o. -We will be hydrated with IV normal saline. -He will be given a banana bag daily and as needed IV Ativan for alcohol withdrawal. -Pain management will be provided. -We will follow serial serum lipase levels. -We will defer abdominal pelvic CT scan if the patient has worsening of his symptoms or his lipase.  2.  Essential hypertension. -Continue amlodipine.  3.  GERD. -We will continue PPI therapy.  4.  Polysubstance abuse including alcohol tobacco and cocaine. -The patient was counseled for sedation.  5.  DVT prophylaxis. -Subcutaneous Lovenox.  All the records are reviewed and case discussed with ED provider. The plan of care was discussed in details with the patient (and family). I answered  all questions. The patient agreed to proceed with the above mentioned plan. Further management will depend upon hospital course.  CODE STATUS: Full code  Status is: Inpatient  Remains inpatient appropriate because:Ongoing active pain requiring inpatient pain management, Ongoing diagnostic testing needed not appropriate for outpatient work up, Unsafe d/c plan, IV treatments appropriate due to intensity of illness or inability to take PO and Inpatient level of care appropriate due to severity of illness   Dispo: The patient is from: Home              Anticipated d/c is to: Home              Anticipated d/c date is: 2 days              Patient currently is not medically stable to d/c.  TOTAL TIME TAKING CARE OF THIS  PATIENT: 55 minutes.    Hannah Beat M.D on 09/29/2020 at 2:01 AM  [facility]   From 7 PM-7 AM, contact night-coverage [email or web address]  CC: Primary care physician; Patient, No Pcp Per

## 2020-09-29 NOTE — ED Notes (Signed)
Pt reports itching to testicles p receiving Dilaudid, pt medicated with benadryl per MAR to assist in relieving itching

## 2020-09-30 DIAGNOSIS — R3914 Feeling of incomplete bladder emptying: Secondary | ICD-10-CM

## 2020-09-30 DIAGNOSIS — R7401 Elevation of levels of liver transaminase levels: Secondary | ICD-10-CM

## 2020-09-30 DIAGNOSIS — N401 Enlarged prostate with lower urinary tract symptoms: Secondary | ICD-10-CM

## 2020-09-30 LAB — LIPASE, BLOOD: Lipase: 63 U/L — ABNORMAL HIGH (ref 11–51)

## 2020-09-30 LAB — AMYLASE: Amylase: 99 U/L (ref 28–100)

## 2020-09-30 MED ORDER — TAMSULOSIN HCL 0.4 MG PO CAPS
0.4000 mg | ORAL_CAPSULE | Freq: Every day | ORAL | Status: DC
  Administered 2020-09-30 – 2020-10-01 (×2): 0.4 mg via ORAL
  Filled 2020-09-30 (×2): qty 1

## 2020-09-30 MED ORDER — OXYCODONE-ACETAMINOPHEN 5-325 MG PO TABS
1.0000 | ORAL_TABLET | Freq: Four times a day (QID) | ORAL | Status: DC | PRN
  Administered 2020-09-30 – 2020-10-01 (×4): 2 via ORAL
  Filled 2020-09-30 (×4): qty 2

## 2020-09-30 NOTE — Progress Notes (Signed)
Bladder scanned patient to see if he was retaining urine. There was a total of 94 mL in his bladder.  Will continue to monitor.  Bruce [name]  09/30/2020  6:30 AM

## 2020-09-30 NOTE — Progress Notes (Signed)
PROGRESS NOTE    Bruce [name]  TMA:[id number] DOB: February 21, 1971 DOA: 09/28/2020 PCP: Patient, No Pcp Per   Assessment & Plan:   Active Problems:   Alcohol-induced pancreatitis   Acute alcoholic pancreatitis: continue on IVFs. Advanced to soft diet for lunch and if tolerates will advance to heart healthy diet at dinner. IV ativan prn for alcohol w/drawal. Alcohol cessation counseling   Transaminitis: likely secondary to alcohol abuse. Still elevated but trending down   Likely BPH: started on tamsulosin   HTN: continue on amlodipine   GERD: continue on PPI  Polysubstance abuse: illicit drug abuse cessation counseling. Hx of HCV   Tobacco abuse: smoking cessation couseling     DVT prophylaxis: lovenox  Code Status: full Family Communication: Disposition Plan: likely d/c back home   Status is: Inpatient  Remains inpatient appropriate because:IV treatments appropriate due to intensity of illness or inability to take PO   Dispo:  Patient From: Home  Planned Disposition: Home  Expected discharge date: 10/01/20  Medically stable for discharge: No     Consultants:      Procedures:    Antimicrobials:    Subjective: Pt c/o difficulty urinating.  Objective: Vitals:   09/29/20 1620 09/29/20 1956 09/30/20 0050 09/30/20 0423  BP: (!) 144/94 (!) 139/96 120/86 123/82  Pulse: 91 76 70 63  Resp: 18 16 16 16   Temp:  97.7 F (36.5 C) 98.2 F (36.8 C) 98.2 F (36.8 C)  TempSrc:  Oral Oral   SpO2: 100% 100% 100% 100%  Weight:      Height:        Intake/Output Summary (Last 24 hours) at 09/30/2020 0716 Last data filed at 09/30/2020 10/02/2020 Gross per 24 hour  Intake 512.96 ml  Output 3160 ml  Net -2647.04 ml   Filed Weights   09/28/20 1830  Weight: 74.8 kg    Examination:  General exam: Appears calm and comfortable  Respiratory system: Clear to auscultation. Respiratory effort normal. Cardiovascular system: S1 & S2 +. No rubs, gallops or  clicks. Gastrointestinal system: Abdomen is nondistended, soft and nontender. Normal bowel sounds heard. Central nervous system: Alert and oriented. Moves all 4 extremities  Psychiatry: Judgement and insight appear normal. Flat mood and affect.     Data Reviewed: I have personally reviewed following labs and imaging studies  CBC: Recent Labs  Lab 09/28/20 1851 09/29/20 0422  WBC 4.9 3.9*  HGB 16.2 14.4  HCT 45.3 40.3  MCV 90.2 90.0  PLT 217 178   Basic Metabolic Panel: Recent Labs  Lab 09/28/20 1851 09/29/20 0422  NA 141 143  K 3.7 3.5  CL 110 108  CO2 24 26  GLUCOSE 115* 104*  BUN 7 7  CREATININE 0.76 0.78  CALCIUM 9.6 9.0   GFR: Estimated Creatinine Clearance: 102.1 mL/min (by C-G formula based on SCr of 0.78 mg/dL). Liver Function Tests: Recent Labs  Lab 09/28/20 1851 09/29/20 0422  AST 101* 71*  ALT 120* 93*  ALKPHOS 86 68  BILITOT 1.0 1.4*  PROT 8.7* 7.5  ALBUMIN 4.4 3.8   Recent Labs  Lab 09/28/20 1851 09/29/20 0422  LIPASE 231* 156*   No results for input(s): AMMONIA in the last 168 hours. Coagulation Profile: No results for input(s): INR, PROTIME in the last 168 hours. Cardiac Enzymes: No results for input(s): CKTOTAL, CKMB, CKMBINDEX, TROPONINI in the last 168 hours. BNP (last 3 results) No results for input(s): PROBNP in the last 8760 hours. HbA1C: No results for input(s):  HGBA1C in the last 72 hours. CBG: No results for input(s): GLUCAP in the last 168 hours. Lipid Profile: No results for input(s): CHOL, HDL, LDLCALC, TRIG, CHOLHDL, LDLDIRECT in the last 72 hours. Thyroid Function Tests: No results for input(s): TSH, T4TOTAL, FREET4, T3FREE, THYROIDAB in the last 72 hours. Anemia Panel: No results for input(s): VITAMINB12, FOLATE, FERRITIN, TIBC, IRON, RETICCTPCT in the last 72 hours. Sepsis Labs: Recent Labs  Lab 09/28/20 1851 09/29/20 0002  LATICACIDVEN 1.8 1.4    Recent Results (from the past 240 hour(s))  Resp Panel by  RT-PCR (Flu A&B, Covid) Nasopharyngeal Swab     Status: None   Collection Time: 09/28/20 11:52 PM   Specimen: Nasopharyngeal Swab; Nasopharyngeal(NP) swabs in vial transport medium  Result Value Ref Range Status   SARS Coronavirus 2 by RT PCR NEGATIVE NEGATIVE Final    Comment: (NOTE) SARS-CoV-2 target nucleic acids are NOT DETECTED.  The SARS-CoV-2 RNA is generally detectable in upper respiratory specimens during the acute phase of infection. The lowest concentration of SARS-CoV-2 viral copies this assay can detect is 138 copies/mL. A negative result does not preclude SARS-Cov-2 infection and should not be used as the sole basis for treatment or other patient management decisions. A negative result may occur with  improper specimen collection/handling, submission of specimen other than nasopharyngeal swab, presence of viral mutation(s) within the areas targeted by this assay, and inadequate number of viral copies(<138 copies/mL). A negative result must be combined with clinical observations, patient history, and epidemiological information. The expected result is Negative.  Fact Sheet for Patients:  BloggerCourse.com  Fact Sheet for Healthcare Providers:  SeriousBroker.it  This test is no t yet approved or cleared by the [place] FDA and  has been authorized for detection and/or diagnosis of SARS-CoV-2 by FDA under an Emergency Use Authorization (EUA). This EUA will remain  in effect (meaning this test can be used) for the duration of the COVID-19 declaration under Section 564(b)(1) of the Act, 21 U.S.C.section 360bbb-3(b)(1), unless the authorization is terminated  or revoked sooner.       Influenza A by PCR NEGATIVE NEGATIVE Final   Influenza B by PCR NEGATIVE NEGATIVE Final    Comment: (NOTE) The Xpert Xpress SARS-CoV-2/FLU/RSV plus assay is intended as an aid in the diagnosis of influenza from Nasopharyngeal swab  specimens and should not be used as a sole basis for treatment. Nasal washings and aspirates are unacceptable for Xpert Xpress SARS-CoV-2/FLU/RSV testing.  Fact Sheet for Patients: BloggerCourse.com  Fact Sheet for Healthcare Providers: SeriousBroker.it  This test is not yet approved or cleared by the [place] FDA and has been authorized for detection and/or diagnosis of SARS-CoV-2 by FDA under an Emergency Use Authorization (EUA). This EUA will remain in effect (meaning this test can be used) for the duration of the COVID-19 declaration under Section 564(b)(1) of the Act, 21 U.S.C. section 360bbb-3(b)(1), unless the authorization is terminated or revoked.  Performed at [facility], [address] [place], [place] [address]          Radiology Studies: No results found.      Scheduled Meds: . amLODipine  10 mg Oral Daily  . enoxaparin (LOVENOX) injection  40 mg Subcutaneous Q24H  . influenza vac split quadrivalent PF  0.5 mL Intramuscular Tomorrow-1000  . pantoprazole  40 mg Oral Daily  . pneumococcal 23 valent vaccine  0.5 mL Intramuscular Tomorrow-1000   Continuous Infusions: . sodium chloride 100 mL/hr at 09/29/20 2239  LOS: 2 days    Time spent: 24 mins    Charise Killian, MD [facility] Pager [phone]  If 7PM-7AM, please contact night-coverage 09/30/2020, 7:16 AM

## 2020-09-30 NOTE — Progress Notes (Signed)
Patient is having difficulty starting stream of urine. He has pain from straining to urinate. Urinalysis was done on 11/22 and it was WDL. Patient informed me that he has an enlarged prostate. I encouraged patient to inform on call doctor in the morning. Will continue to monitor.  Arturo Morton

## 2020-10-01 ENCOUNTER — Telehealth: Payer: Self-pay | Admitting: *Deleted

## 2020-10-01 LAB — CBC
HCT: 37.3 % — ABNORMAL LOW (ref 39.0–52.0)
Hemoglobin: 13.6 g/dL (ref 13.0–17.0)
MCH: 32.6 pg (ref 26.0–34.0)
MCHC: 36.5 g/dL — ABNORMAL HIGH (ref 30.0–36.0)
MCV: 89.4 fL (ref 80.0–100.0)
Platelets: 156 10*3/uL (ref 150–400)
RBC: 4.17 MIL/uL — ABNORMAL LOW (ref 4.22–5.81)
RDW: 12.2 % (ref 11.5–15.5)
WBC: 3.2 10*3/uL — ABNORMAL LOW (ref 4.0–10.5)
nRBC: 0 % (ref 0.0–0.2)

## 2020-10-01 LAB — BASIC METABOLIC PANEL
Anion gap: 8 (ref 5–15)
BUN: 6 mg/dL (ref 6–20)
CO2: 30 mmol/L (ref 22–32)
Calcium: 9 mg/dL (ref 8.9–10.3)
Chloride: 99 mmol/L (ref 98–111)
Creatinine, Ser: 0.67 mg/dL (ref 0.61–1.24)
GFR, Estimated: >60 mL/min (ref >60)
Glucose, Bld: 128 mg/dL — ABNORMAL HIGH (ref 70–99)
Potassium: 3.1 mmol/L — ABNORMAL LOW (ref 3.5–5.1)
Sodium: 137 mmol/L (ref 135–145)

## 2020-10-01 LAB — LIPASE, BLOOD: Lipase: 51 U/L (ref 11–51)

## 2020-10-01 MED ORDER — ONDANSETRON HCL 4 MG PO TABS: 4.0000 mg | ORAL_TABLET | Freq: Three times a day (TID) | ORAL | 0 refills | Status: AC | PRN

## 2020-10-01 MED ORDER — OXYCODONE-ACETAMINOPHEN 5-325 MG PO TABS: 1.0000 | ORAL_TABLET | Freq: Four times a day (QID) | ORAL | 0 refills | Status: AC | PRN

## 2020-10-01 MED ORDER — TAMSULOSIN HCL 0.4 MG PO CAPS: 0.4000 mg | ORAL_CAPSULE | Freq: Every day | ORAL | 0 refills | Status: AC

## 2020-10-01 MED ORDER — POTASSIUM CHLORIDE CRYS ER 20 MEQ PO TBCR
60.0000 meq | EXTENDED_RELEASE_TABLET | Freq: Once | ORAL | Status: AC
  Administered 2020-10-01: 60 meq via ORAL
  Filled 2020-10-01: qty 3

## 2020-10-01 NOTE — Discharge Summary (Signed)
Physician Discharge Summary  Bruce [name] JYN:[id number] DOB: 02-Nov-1971 DOA: 09/28/2020  PCP: Patient, No Pcp Per  Admit date: 09/28/2020 Discharge date: 10/01/2020  Admitted From: home  Disposition: home  Recommendations for Outpatient Follow-up:  1. Follow up with PCP in 1-2 weeks   Home Health: no  Equipment/Devices:   Discharge Condition: stable  CODE STATUS: full  Diet recommendation: soft diet and advance as tolerated   Brief/Interim Summary: HPI was taken from Dr. Arville Care: Bruce [name]  is a 49 y.o. male with a known history of polysubstance abuse including cocaine, tobacco and alcohol, pancreatitis, atrial fibrillation and hepatitis C, who presented to the emergency room with acute onset of generalized abdominal pain mainly in the left upper quadrant and epigastric area with associated nausea and vomiting since this morning.  He denies any fever or chills.  His last alcoholic drink was for beer last night.  He denies any cough or wheezing or dyspnea.  No fever or chills.  No chest pain or palpitations.  He has been having urinary frequency without dysuria, oliguria or hematuria or flank pain. The patient was most recently admitted in August with a similar presentation, lipase in the 300s and CT showing uncomplicated pancreatitis.  Upon presentation to the emergency room, blood pressure was 142/96 with a pulse of 109 with otherwise normal vital signs.  Labs revealed serum lipase of 231 with AST 101 AST of 122 obtained 8.7 and albumin 4.4.  Lactic acid was 1.8 and later 1.4 CBC showed no significant abnormality.  Influenza AMB antigen ischemic negative as well as COVID-19 PCR.  Chest x-ray showed no acute cardiopulmonary disease. EKG showed sinus tachycardia with rate 102 with premature atrial complexes and poor R wave progression.  The patient was given IV Zofran and Phenergan as well as Dilaudid had itching for which he was given 25 mg of IV Benadryl.  He was given 2 L bolus  of IV normal saline.  He will be admitted to a medical bed for further evaluation and management.  Hospital Course from Dr. Wilfred Lacy 11/24-11/25/21: Pt presented w/ pancreatis secondary to alcohol abuse. Pt was treated w/ NPO, IVFs, pain meds & IV zofran prn. Pt's diet was slowly advanced while inpatient and pt was able to tolerate po diet prior to d/c. Pt did receive alcohol cessation counseling. Pt was d/c home w/ po percocet and zofran prn.   Discharge Diagnoses:  Active Problems:   Alcohol-induced pancreatitis  Acute alcoholic pancreatitis: continue on IVFs. Advanced to heart healthy. IV ativan prn for alcohol w/drawal. Alcohol cessation counseling. Percocet prn for pain   Transaminitis: likely secondary to alcohol abuse. Still elevated but trending down   Likely BPH: started on tamsulosin   HTN: continue on amlodipine   GERD: continue on PPI  Polysubstance abuse: illicit drug abuse cessation counseling. Hx of HCV   Tobacco abuse: smoking cessation couseling   Discharge Instructions  Discharge Instructions    Diet - low sodium heart healthy   Complete by: As directed    Discharge instructions   Complete by: As directed    F/u PCP in 1 week. Do not drink any type of alcohol as this cause the pancreatitis to come back again   Increase activity slowly   Complete by: As directed      Allergies as of 10/01/2020      Reactions   Penicillins Other (See Comments)   Did it involve swelling of the face/tongue/throat, SOB, or low BP? No Did  it involve sudden or severe rash/hives, skin peeling, or any reaction on the inside of your mouth or nose? No Did you need to seek medical attention at a hospital or doctor's office? No When did it last happen? If all above answers are "NO", may proceed with cephalosporin use.      Medication List    TAKE these medications   amLODipine 10 MG tablet Commonly known as: NORVASC Take 1 tablet (10 mg total) by mouth daily.    ondansetron 4 MG tablet Commonly known as: ZOFRAN Take 1 tablet (4 mg total) by mouth every 8 (eight) hours as needed for up to 7 days for nausea or vomiting.   oxyCODONE-acetaminophen 5-325 MG tablet Commonly known as: PERCOCET/ROXICET Take 1 tablet by mouth every 6 (six) hours as needed for up to 5 days for moderate pain or severe pain.   pantoprazole 40 MG tablet Commonly known as: Protonix Take 1 tablet (40 mg total) by mouth daily.   tamsulosin 0.4 MG Caps capsule Commonly known as: FLOMAX Take 1 capsule (0.4 mg total) by mouth daily. Start taking on: October 02, 2020       Allergies  Allergen Reactions  . Penicillins Other (See Comments)    Did it involve swelling of the face/tongue/throat, SOB, or low BP? No Did it involve sudden or severe rash/hives, skin peeling, or any reaction on the inside of your mouth or nose? No Did you need to seek medical attention at a hospital or doctor's office? No When did it last happen? If all above answers are "NO", may proceed with cephalosporin use.    Consultations:     Procedures/Studies: No results found.    Subjective: Pt c/o fatigue   Discharge Exam: Vitals:   10/01/20 0834 10/01/20 1129  BP: (!) 134/94 (!) 136/96  Pulse: 74 60  Resp: 18 16  Temp: 97.6 F (36.4 C) (!) 97.5 F (36.4 C)  SpO2: 100% 100%   Vitals:   09/30/20 2354 10/01/20 0534 10/01/20 0834 10/01/20 1129  BP: 119/82 (!) 143/97 (!) 134/94 (!) 136/96  Pulse: 74 66 74 60  Resp: 20 16 18 16   Temp: 97.9 F (36.6 C) 98.1 F (36.7 C) 97.6 F (36.4 C) (!) 97.5 F (36.4 C)  TempSrc: Oral Oral Oral Oral  SpO2: 100% 99% 100% 100%  Weight:      Height:        General: Pt is alert, awake, not in acute distress Cardiovascular: S1/S2 +, no rubs, no gallops Respiratory: CTA bilaterally, no wheezing, no rhonchi Abdominal: Soft, NT, ND, bowel sounds + Extremities: no cyanosis    The results of significant diagnostics from this  hospitalization (including imaging, microbiology, ancillary and laboratory) are listed below for reference.     Microbiology: Recent Results (from the past 240 hour(s))  Resp Panel by RT-PCR (Flu A&B, Covid) Nasopharyngeal Swab     Status: None   Collection Time: 09/28/20 11:52 PM   Specimen: Nasopharyngeal Swab; Nasopharyngeal(NP) swabs in vial transport medium  Result Value Ref Range Status   SARS Coronavirus 2 by RT PCR NEGATIVE NEGATIVE Final    Comment: (NOTE) SARS-CoV-2 target nucleic acids are NOT DETECTED.  The SARS-CoV-2 RNA is generally detectable in upper respiratory specimens during the acute phase of infection. The lowest concentration of SARS-CoV-2 viral copies this assay can detect is 138 copies/mL. A negative result does not preclude SARS-Cov-2 infection and should not be used as the sole basis for treatment or other patient management decisions.  A negative result may occur with  improper specimen collection/handling, submission of specimen other than nasopharyngeal swab, presence of viral mutation(s) within the areas targeted by this assay, and inadequate number of viral copies(<138 copies/mL). A negative result must be combined with clinical observations, patient history, and epidemiological information. The expected result is Negative.  Fact Sheet for Patients:  BloggerCourse.com  Fact Sheet for Healthcare Providers:  SeriousBroker.it  This test is no t yet approved or cleared by the [place] FDA and  has been authorized for detection and/or diagnosis of SARS-CoV-2 by FDA under an Emergency Use Authorization (EUA). This EUA will remain  in effect (meaning this test can be used) for the duration of the COVID-19 declaration under Section 564(b)(1) of the Act, 21 U.S.C.section 360bbb-3(b)(1), unless the authorization is terminated  or revoked sooner.       Influenza A by PCR NEGATIVE NEGATIVE Final    Influenza B by PCR NEGATIVE NEGATIVE Final    Comment: (NOTE) The Xpert Xpress SARS-CoV-2/FLU/RSV plus assay is intended as an aid in the diagnosis of influenza from Nasopharyngeal swab specimens and should not be used as a sole basis for treatment. Nasal washings and aspirates are unacceptable for Xpert Xpress SARS-CoV-2/FLU/RSV testing.  Fact Sheet for Patients: BloggerCourse.com  Fact Sheet for Healthcare Providers: SeriousBroker.it  This test is not yet approved or cleared by the [place] FDA and has been authorized for detection and/or diagnosis of SARS-CoV-2 by FDA under an Emergency Use Authorization (EUA). This EUA will remain in effect (meaning this test can be used) for the duration of the COVID-19 declaration under Section 564(b)(1) of the Act, 21 U.S.C. section 360bbb-3(b)(1), unless the authorization is terminated or revoked.  Performed at [facility], [address] [place], [place] [address]      Labs: BNP (last 3 results) No results for input(s): BNP in the last 8760 hours. Basic Metabolic Panel: Recent Labs  Lab 09/28/20 1851 09/29/20 0422 10/01/20 0608  NA 141 143 137  K 3.7 3.5 3.1*  CL 110 108 99  CO2 24 26 30   GLUCOSE 115* 104* 128*  BUN 7 7 6   CREATININE 0.76 0.78 0.67  CALCIUM 9.6 9.0 9.0   Liver Function Tests: Recent Labs  Lab 09/28/20 1851 09/29/20 0422  AST 101* 71*  ALT 120* 93*  ALKPHOS 86 68  BILITOT 1.0 1.4*  PROT 8.7* 7.5  ALBUMIN 4.4 3.8   Recent Labs  Lab 09/28/20 1851 09/29/20 0422 09/30/20 0628 10/01/20 0608  LIPASE 231* 156* 63* 51  AMYLASE  --   --  99  --    No results for input(s): AMMONIA in the last 168 hours. CBC: Recent Labs  Lab 09/28/20 1851 09/29/20 0422 10/01/20 0608  WBC 4.9 3.9* 3.2*  HGB 16.2 14.4 13.6  HCT 45.3 40.3 37.3*  MCV 90.2 90.0 89.4  PLT 217 178 156   Cardiac Enzymes: No results for input(s): CKTOTAL, CKMB,  CKMBINDEX, TROPONINI in the last 168 hours. BNP: Invalid input(s): POCBNP CBG: No results for input(s): GLUCAP in the last 168 hours. D-Dimer No results for input(s): DDIMER in the last 72 hours. Hgb A1c No results for input(s): HGBA1C in the last 72 hours. Lipid Profile No results for input(s): CHOL, HDL, LDLCALC, TRIG, CHOLHDL, LDLDIRECT in the last 72 hours. Thyroid function studies No results for input(s): TSH, T4TOTAL, T3FREE, THYROIDAB in the last 72 hours.  Invalid input(s): FREET3 Anemia work up No results for input(s): VITAMINB12, FOLATE, FERRITIN, TIBC, IRON,  RETICCTPCT in the last 72 hours. Urinalysis    Component Value Date/Time   COLORURINE YELLOW (A) 09/28/2020 1851   APPEARANCEUR HAZY (A) 09/28/2020 1851   LABSPEC 1.014 09/28/2020 1851   PHURINE 5.0 09/28/2020 1851   GLUCOSEU NEGATIVE 09/28/2020 1851   HGBUR NEGATIVE 09/28/2020 1851   BILIRUBINUR NEGATIVE 09/28/2020 1851   KETONESUR 5 (A) 09/28/2020 1851   PROTEINUR 30 (A) 09/28/2020 1851   UROBILINOGEN 1.0 05/01/2012 0835   NITRITE NEGATIVE 09/28/2020 1851   LEUKOCYTESUR NEGATIVE 09/28/2020 1851   Sepsis Labs Invalid input(s): PROCALCITONIN,  WBC,  LACTICIDVEN Microbiology Recent Results (from the past 240 hour(s))  Resp Panel by RT-PCR (Flu A&B, Covid) Nasopharyngeal Swab     Status: None   Collection Time: 09/28/20 11:52 PM   Specimen: Nasopharyngeal Swab; Nasopharyngeal(NP) swabs in vial transport medium  Result Value Ref Range Status   SARS Coronavirus 2 by RT PCR NEGATIVE NEGATIVE Final    Comment: (NOTE) SARS-CoV-2 target nucleic acids are NOT DETECTED.  The SARS-CoV-2 RNA is generally detectable in upper respiratory specimens during the acute phase of infection. The lowest concentration of SARS-CoV-2 viral copies this assay can detect is 138 copies/mL. A negative result does not preclude SARS-Cov-2 infection and should not be used as the sole basis for treatment or other patient management  decisions. A negative result may occur with  improper specimen collection/handling, submission of specimen other than nasopharyngeal swab, presence of viral mutation(s) within the areas targeted by this assay, and inadequate number of viral copies(<138 copies/mL). A negative result must be combined with clinical observations, patient history, and epidemiological information. The expected result is Negative.  Fact Sheet for Patients:  BloggerCourse.com  Fact Sheet for Healthcare Providers:  SeriousBroker.it  This test is no t yet approved or cleared by the [place] FDA and  has been authorized for detection and/or diagnosis of SARS-CoV-2 by FDA under an Emergency Use Authorization (EUA). This EUA will remain  in effect (meaning this test can be used) for the duration of the COVID-19 declaration under Section 564(b)(1) of the Act, 21 U.S.C.section 360bbb-3(b)(1), unless the authorization is terminated  or revoked sooner.       Influenza A by PCR NEGATIVE NEGATIVE Final   Influenza B by PCR NEGATIVE NEGATIVE Final    Comment: (NOTE) The Xpert Xpress SARS-CoV-2/FLU/RSV plus assay is intended as an aid in the diagnosis of influenza from Nasopharyngeal swab specimens and should not be used as a sole basis for treatment. Nasal washings and aspirates are unacceptable for Xpert Xpress SARS-CoV-2/FLU/RSV testing.  Fact Sheet for Patients: BloggerCourse.com  Fact Sheet for Healthcare Providers: SeriousBroker.it  This test is not yet approved or cleared by the [place] FDA and has been authorized for detection and/or diagnosis of SARS-CoV-2 by FDA under an Emergency Use Authorization (EUA). This EUA will remain in effect (meaning this test can be used) for the duration of the COVID-19 declaration under Section 564(b)(1) of the Act, 21 U.S.C. section 360bbb-3(b)(1), unless the  authorization is terminated or revoked.  Performed at [facility], [address], [place], [place] [address]      Time coordinating discharge: Over 30 minutes  SIGNED:   Charise Killian, MD  [facility] 10/01/2020, 11:55 AM Pager   If 7PM-7AM, please contact night-coverage

## 2020-10-01 NOTE — Progress Notes (Signed)
Discharge instructions and med details reviewed with patient. Pt verbalizes understanding. Printed prescriptions and AVS given to patient. IV removed. Patient escorted out via wheelchair

## 2020-12-21 ENCOUNTER — Inpatient Hospital Stay
Admission: EM | Admit: 2020-12-21 | Discharge: 2020-12-26 | DRG: 308 | Disposition: A | Discharge status: Home / Self Care | Payer: Self-pay | Attending: Internal Medicine | Admitting: Internal Medicine

## 2020-12-21 ENCOUNTER — Encounter: Payer: Self-pay | Admitting: Emergency Medicine

## 2020-12-21 ENCOUNTER — Emergency Department: Payer: Self-pay

## 2020-12-21 ENCOUNTER — Other Ambulatory Visit: Payer: Self-pay

## 2020-12-21 DIAGNOSIS — F17210 Nicotine dependence, cigarettes, uncomplicated: Secondary | ICD-10-CM | POA: Diagnosis present

## 2020-12-21 DIAGNOSIS — Z79899 Other long term (current) drug therapy: Secondary | ICD-10-CM

## 2020-12-21 DIAGNOSIS — E876 Hypokalemia: Secondary | ICD-10-CM | POA: Diagnosis present

## 2020-12-21 DIAGNOSIS — K219 Gastro-esophageal reflux disease without esophagitis: Secondary | ICD-10-CM | POA: Diagnosis present

## 2020-12-21 DIAGNOSIS — I493 Ventricular premature depolarization: Secondary | ICD-10-CM | POA: Diagnosis present

## 2020-12-21 DIAGNOSIS — F1410 Cocaine abuse, uncomplicated: Secondary | ICD-10-CM | POA: Diagnosis present

## 2020-12-21 DIAGNOSIS — N401 Enlarged prostate with lower urinary tract symptoms: Secondary | ICD-10-CM | POA: Diagnosis present

## 2020-12-21 DIAGNOSIS — N400 Enlarged prostate without lower urinary tract symptoms: Secondary | ICD-10-CM | POA: Diagnosis present

## 2020-12-21 DIAGNOSIS — K852 Alcohol induced acute pancreatitis without necrosis or infection: Secondary | ICD-10-CM | POA: Diagnosis present

## 2020-12-21 DIAGNOSIS — Z9119 Patient's noncompliance with other medical treatment and regimen: Secondary | ICD-10-CM

## 2020-12-21 DIAGNOSIS — Z813 Family history of other psychoactive substance abuse and dependence: Secondary | ICD-10-CM

## 2020-12-21 DIAGNOSIS — K8522 Alcohol induced acute pancreatitis with infected necrosis: Secondary | ICD-10-CM

## 2020-12-21 DIAGNOSIS — F1910 Other psychoactive substance abuse, uncomplicated: Secondary | ICD-10-CM | POA: Diagnosis present

## 2020-12-21 DIAGNOSIS — R350 Frequency of micturition: Secondary | ICD-10-CM | POA: Diagnosis present

## 2020-12-21 DIAGNOSIS — D709 Neutropenia, unspecified: Secondary | ICD-10-CM | POA: Diagnosis present

## 2020-12-21 DIAGNOSIS — Z20822 Contact with and (suspected) exposure to covid-19: Secondary | ICD-10-CM | POA: Diagnosis present

## 2020-12-21 DIAGNOSIS — I480 Paroxysmal atrial fibrillation: Principal | ICD-10-CM | POA: Diagnosis present

## 2020-12-21 DIAGNOSIS — Z880 Allergy status to penicillin: Secondary | ICD-10-CM

## 2020-12-21 DIAGNOSIS — Z9114 Patient's other noncompliance with medication regimen: Secondary | ICD-10-CM

## 2020-12-21 DIAGNOSIS — I4891 Unspecified atrial fibrillation: Secondary | ICD-10-CM

## 2020-12-21 DIAGNOSIS — B1920 Unspecified viral hepatitis C without hepatic coma: Secondary | ICD-10-CM | POA: Diagnosis present

## 2020-12-21 DIAGNOSIS — I10 Essential (primary) hypertension: Secondary | ICD-10-CM | POA: Diagnosis present

## 2020-12-21 DIAGNOSIS — F1010 Alcohol abuse, uncomplicated: Secondary | ICD-10-CM | POA: Diagnosis present

## 2020-12-21 LAB — COMPREHENSIVE METABOLIC PANEL
ALT: 53 U/L — ABNORMAL HIGH (ref 0–44)
AST: 62 U/L — ABNORMAL HIGH (ref 15–41)
Albumin: 4.4 g/dL (ref 3.5–5.0)
Alkaline Phosphatase: 99 U/L (ref 38–126)
Anion gap: 15 (ref 5–15)
BUN: 7 mg/dL (ref 6–20)
CO2: 18 mmol/L — ABNORMAL LOW (ref 22–32)
Calcium: 9.5 mg/dL (ref 8.9–10.3)
Chloride: 105 mmol/L (ref 98–111)
Creatinine, Ser: 0.71 mg/dL (ref 0.61–1.24)
GFR, Estimated: >60 mL/min (ref >60)
Glucose, Bld: 76 mg/dL (ref 70–99)
Potassium: 3.9 mmol/L (ref 3.5–5.1)
Sodium: 138 mmol/L (ref 135–145)
Total Bilirubin: 1 mg/dL (ref 0.3–1.2)
Total Protein: 9.1 g/dL — ABNORMAL HIGH (ref 6.5–8.1)

## 2020-12-21 LAB — URINALYSIS, COMPLETE (UACMP) WITH MICROSCOPIC
Bacteria, UA: NONE SEEN
Bilirubin Urine: NEGATIVE
Glucose, UA: NEGATIVE mg/dL
Hgb urine dipstick: NEGATIVE
Ketones, ur: 20 mg/dL — AB
Leukocytes,Ua: NEGATIVE
Nitrite: NEGATIVE
Protein, ur: NEGATIVE mg/dL
Specific Gravity, Urine: 1.023 (ref 1.005–1.030)
Squamous Epithelial / HPF: NONE SEEN (ref 0–5)
pH: 5 (ref 5.0–8.0)

## 2020-12-21 LAB — CBC
HCT: 46.8 % (ref 39.0–52.0)
Hemoglobin: 16.9 g/dL (ref 13.0–17.0)
MCH: 32.3 pg (ref 26.0–34.0)
MCHC: 36.1 g/dL — ABNORMAL HIGH (ref 30.0–36.0)
MCV: 89.3 fL (ref 80.0–100.0)
Platelets: 253 10*3/uL (ref 150–400)
RBC: 5.24 MIL/uL (ref 4.22–5.81)
RDW: 12.9 % (ref 11.5–15.5)
WBC: 11.6 10*3/uL — ABNORMAL HIGH (ref 4.0–10.5)
nRBC: 0 % (ref 0.0–0.2)

## 2020-12-21 LAB — T4, FREE: Free T4: 0.81 ng/dL (ref 0.61–1.12)

## 2020-12-21 LAB — LIPASE, BLOOD: Lipase: 394 U/L — ABNORMAL HIGH (ref 11–51)

## 2020-12-21 LAB — MAGNESIUM: Magnesium: 1.9 mg/dL (ref 1.7–2.4)

## 2020-12-21 LAB — TSH: TSH: 0.219 u[IU]/mL — ABNORMAL LOW (ref 0.350–4.500)

## 2020-12-21 LAB — TROPONIN I (HIGH SENSITIVITY): Troponin I (High Sensitivity): 18 ng/L — ABNORMAL HIGH (ref <18)

## 2020-12-21 MED ORDER — DILTIAZEM LOAD VIA INFUSION
10.0000 mg | Freq: Once | INTRAVENOUS | Status: AC
  Administered 2020-12-21: 10 mg via INTRAVENOUS
  Filled 2020-12-21: qty 10

## 2020-12-21 MED ORDER — ONDANSETRON HCL 4 MG/2ML IJ SOLN
4.0000 mg | Freq: Once | INTRAMUSCULAR | Status: AC
  Administered 2020-12-21: 4 mg via INTRAVENOUS
  Filled 2020-12-21: qty 2

## 2020-12-21 MED ORDER — ALPRAZOLAM 0.25 MG PO TABS: 0.2500 mg | ORAL_TABLET | Freq: Two times a day (BID) | ORAL | Status: DC | PRN

## 2020-12-21 MED ORDER — IOHEXOL 350 MG/ML SOLN
75.0000 mL | Freq: Once | INTRAVENOUS | Status: DC | PRN
  Filled 2020-12-21: qty 75

## 2020-12-21 MED ORDER — IOHEXOL 300 MG/ML  SOLN
100.0000 mL | Freq: Once | INTRAMUSCULAR | Status: AC | PRN
  Administered 2020-12-21: 100 mL via INTRAVENOUS
  Filled 2020-12-21: qty 100

## 2020-12-21 MED ORDER — HYDROMORPHONE HCL 1 MG/ML IJ SOLN
1.0000 mg | Freq: Once | INTRAMUSCULAR | Status: AC
  Administered 2020-12-21: 1 mg via INTRAVENOUS
  Filled 2020-12-21: qty 1

## 2020-12-21 MED ORDER — DIPHENHYDRAMINE HCL 25 MG PO CAPS
ORAL_CAPSULE | ORAL | Status: AC
  Filled 2020-12-21: qty 1

## 2020-12-21 MED ORDER — MORPHINE SULFATE (PF) 2 MG/ML IV SOLN
2.0000 mg | INTRAVENOUS | Status: DC | PRN
  Administered 2020-12-22 (×2): 2 mg via INTRAVENOUS
  Filled 2020-12-21: qty 1

## 2020-12-21 MED ORDER — ZOLPIDEM TARTRATE 5 MG PO TABS: 5.0000 mg | ORAL_TABLET | Freq: Every evening | ORAL | Status: DC | PRN

## 2020-12-21 MED ORDER — ONDANSETRON HCL 4 MG/2ML IJ SOLN
4.0000 mg | Freq: Four times a day (QID) | INTRAMUSCULAR | Status: DC | PRN
  Administered 2020-12-22 – 2020-12-24 (×6): 4 mg via INTRAVENOUS
  Filled 2020-12-21 (×6): qty 2

## 2020-12-21 MED ORDER — DIPHENHYDRAMINE HCL 25 MG PO CAPS
25.0000 mg | ORAL_CAPSULE | Freq: Four times a day (QID) | ORAL | Status: DC | PRN
  Administered 2020-12-21 – 2020-12-23 (×7): 25 mg via ORAL
  Filled 2020-12-21 (×6): qty 1

## 2020-12-21 MED ORDER — DILTIAZEM HCL-DEXTROSE 125-5 MG/125ML-% IV SOLN (PREMIX): 5.0000 mg/h | INTRAVENOUS | Status: DC

## 2020-12-21 MED ORDER — ACETAMINOPHEN 325 MG PO TABS: 650.0000 mg | ORAL_TABLET | ORAL | Status: DC | PRN

## 2020-12-21 MED ORDER — ONDANSETRON 4 MG PO TBDP
4.0000 mg | ORAL_TABLET | Freq: Once | ORAL | Status: AC | PRN
  Administered 2020-12-21: 4 mg via ORAL
  Filled 2020-12-21: qty 1

## 2020-12-21 MED ORDER — ENOXAPARIN SODIUM 40 MG/0.4ML ~~LOC~~ SOLN
40.0000 mg | SUBCUTANEOUS | Status: DC
  Administered 2020-12-22 – 2020-12-26 (×5): 40 mg via SUBCUTANEOUS
  Filled 2020-12-21 (×5): qty 0.4

## 2020-12-21 MED ORDER — SODIUM CHLORIDE 0.9 % IV SOLN: INTRAVENOUS | Status: DC

## 2020-12-21 MED ORDER — PROMETHAZINE HCL 25 MG/ML IJ SOLN
12.5000 mg | INTRAMUSCULAR | Status: DC | PRN
  Administered 2020-12-22: 12.5 mg via INTRAVENOUS
  Filled 2020-12-21: qty 1

## 2020-12-21 MED ORDER — ONDANSETRON HCL 4 MG/2ML IJ SOLN
INTRAMUSCULAR | Status: AC
  Administered 2020-12-21: 4 mg via INTRAVENOUS
  Filled 2020-12-21: qty 2

## 2020-12-21 MED ORDER — DILTIAZEM HCL-DEXTROSE 125-5 MG/125ML-% IV SOLN (PREMIX)
5.0000 mg/h | INTRAVENOUS | Status: DC
  Administered 2020-12-21: 5 mg/h via INTRAVENOUS
  Filled 2020-12-21: qty 125

## 2020-12-21 MED ORDER — LACTATED RINGERS IV BOLUS
1000.0000 mL | Freq: Once | INTRAVENOUS | Status: AC
  Administered 2020-12-21: 1000 mL via INTRAVENOUS

## 2020-12-21 NOTE — ED Provider Notes (Signed)
-----------------------------------------   9:53 PM on 12/21/2020 -----------------------------------------  Blood pressure (!) 131/107, pulse 90, temperature 98.2 F (36.8 C), temperature source Oral, resp. rate (!) 23, height 5\' 7"  (1.702 m), weight 74.8 kg, SpO2 96 %.  Assuming care from Dr. .  In short, Bruce [name] is a 50 y.o. male with a chief complaint of Abdominal Pain and Emesis .  Refer to the original H&P for additional details.  The current plan of care is to follow-up CT scan results for recurrent alcoholic pancreatitis and AF with RVR on diltiazem drip.  ----------------------------------------- 10:45 PM on 12/21/2020 -----------------------------------------  CT scan shows uncomplicated pancreatitis with no evidence of necrosis or pseudocyst.  Heart rate remains controlled on diltiazem drip and case discussed with hospitalist for admission.    12/23/2020, MD 12/21/20 2245

## 2020-12-21 NOTE — H&P (Signed)
[place]   PATIENT NAME: Bruce [name]    MR#:  [id number]  DATE OF BIRTH:  08/22/1971  DATE OF ADMISSION:  12/21/2020  PRIMARY CARE PHYSICIAN: Patient, No Pcp Per   Patient is coming from: Home REQUESTING/REFERRING PHYSICIAN: Chesley NoonJessup, Charles, MD  CHIEF COMPLAINT:   Chief Complaint  Patient presents with  . Abdominal Pain  . Emesis    HISTORY OF PRESENT ILLNESS:  Bruce [name] is a 50 y.o. African-American male with medical history significant for paroxysmal atrial fibrillation, polysubstance abuse and hepatitis C, who presented to the emergency room with acute onset of left upper quadrant and epigastric abdominal pain that started since this morning with associated recurrent nausea and vomiting.  His last alcoholic drink was yesterday.  He denies any fever or chills.  No diarrhea or melena or bright red blood per rectum.  No bilious vomitus or hematemesis.  He has been experiencing chest pain felt this fluttering with palpitations.  No dyspnea or cough or wheezing or hemoptysis.  He was noted to be in atrial fibrillation with rapid ventricular response ED Course: When he came to the ER respiratory rate was 22 and heart rate was 98 and later 205 and irregular.  Labs revealed a serum lipase of 394 with AST 62 ALT 53 and total bili of 9.1 with albumin 4.4 and otherwise unremarkable CMP.  Magnesium was 1.9.  High-sensitivity troponin was 18.  CBC showed mild leukocytosis of 11.6.  TSH came back low at 0.19 and free T4 0.81 UA was unremarkable.  Portable chest ray showed no acute cardiopulmonary disease EKG as reviewed by me : Showed atrial fibrillation with rapid ventricular response showed normal sinus rhythm with rate 96 after conversion Imaging: Abdominal pelvic CT scan showed acute edematous pancreatitis with no pancreatic necrosis or peripancreatic fluid collection.  It showed hepatic steatosis.  The patient was given 10 mg of IV diltiazem followed by IV diltiazem drip, 1  mg of IV Dilaudid twice, 1 L of IV lactated Ringer and 4 mg of IV Zofran twice.  He will be admitted to a progressive unit bed for further evaluation and management. PAST MEDICAL HISTORY:   Past Medical History:  Diagnosis Date  . A-fib (HCC)   . Dental caries   . Hepatitis C infection   . Iritis   . PAF (paroxysmal atrial fibrillation) (HCC)    a. diagnosed 03/19/18; b. CHADS2VASc 0  . Polysubstance abuse (HCC)    a. cocaine, tobacco, etoh  . Scrotal mass   . Tick borne fever   . Uveitis     PAST SURGICAL HISTORY:   Past Surgical History:  Procedure Laterality Date  . FINGER SURGERY     infection, middle left  . fractured jaw    . SHOULDER SURGERY     right    SOCIAL HISTORY:   Social History   Tobacco Use  . Smoking status: Current Some Day Smoker    Packs/day: 0.25    Types: Cigarettes  . Smokeless tobacco: Never Used  . Tobacco comment: Per pt  " I only smoke cigarettes when I drink beer"  Substance Use Topics  . Alcohol use: Yes    Comment: 6 pack per week    FAMILY HISTORY:   Family History  Problem Relation Age of Onset  . Drug abuse Father     DRUG ALLERGIES:   Allergies  Allergen Reactions  . Penicillins Other (See Comments)    Did it  involve swelling of the face/tongue/throat, SOB, or low BP? No Did it involve sudden or severe rash/hives, skin peeling, or any reaction on the inside of your mouth or nose? No Did you need to seek medical attention at a hospital or doctor's office? No When did it last happen? If all above answers are "NO", may proceed with cephalosporin use.    REVIEW OF SYSTEMS:   ROS As per history of present illness. All pertinent systems were reviewed above. Constitutional, HEENT, cardiovascular, respiratory, GI, GU, musculoskeletal, neuro, psychiatric, endocrine, integumentary and hematologic systems were reviewed and are otherwise negative/unremarkable except for positive findings mentioned above in the  HPI.   MEDICATIONS AT HOME:   Prior to Admission medications   Medication Sig Start Date End Date Taking? Authorizing Provider  amLODipine (NORVASC) 10 MG tablet Take 1 tablet (10 mg total) by mouth daily. Patient not taking: Reported on 09/29/2020 07/03/20   Cipriano Bunker, MD  pantoprazole (PROTONIX) 40 MG tablet Take 1 tablet (40 mg total) by mouth daily. Patient not taking: Reported on 09/29/2020 07/02/20 07/02/21  Cipriano Bunker, MD      VITAL SIGNS:  Blood pressure 121/71, pulse 71, temperature 98.2 F (36.8 C), temperature source Oral, resp. rate 20, height 5\' 7"  (1.702 m), weight 74.8 kg, SpO2 99 %.  PHYSICAL EXAMINATION:  Physical Exam  GENERAL:  50 y.o.-year-old African-American patient lying in the bed with mild distress secondary to nausea and vomiting with abdominal discomfort. EYES: Pupils equal, round, reactive to light and accommodation. No scleral icterus. Extraocular muscles intact.  HEENT: Head atraumatic, normocephalic. Oropharynx and nasopharynx clear.  NECK:  Supple, no jugular venous distention. No thyroid enlargement, no tenderness.  LUNGS: Normal breath sounds bilaterally, no wheezing, rales,rhonchi or crepitation. No use of accessory muscles of respiration.  CARDIOVASCULAR: Regular rate and rhythm, S1, S2 normal. No murmurs, rubs, or gallops.  ABDOMEN: Soft, nondistended with left upper quadrant epigastric as well as left lower quadrant tenderness without rebound tenderness guarding or rigidity.. Bowel sounds present. No organomegaly or mass.  EXTREMITIES: No pedal edema, cyanosis, or clubbing.  NEUROLOGIC: Cranial nerves [name] through XII are intact. Muscle strength 5/5 in all extremities. Sensation intact. Gait not checked.  PSYCHIATRIC: The patient is alert and oriented x 3.  Normal affect and good eye contact. SKIN: No obvious rash, lesion, or ulcer.   LABORATORY PANEL:   CBC Recent Labs  Lab 12/21/20 1731  WBC 11.6*  HGB 16.9  HCT 46.8  PLT 253    ------------------------------------------------------------------------------------------------------------------  Chemistries  Recent Labs  Lab 12/21/20 1731  NA 138  K 3.9  CL 105  CO2 18*  GLUCOSE 76  BUN 7  CREATININE 0.71  CALCIUM 9.5  AST 62*  ALT 53*  ALKPHOS 99  BILITOT 1.0   ------------------------------------------------------------------------------------------------------------------  Cardiac Enzymes No results for input(s): TROPONINI in the last 168 hours. ------------------------------------------------------------------------------------------------------------------  RADIOLOGY:  DG Chest 1 View  Result Date: 12/21/2020 CLINICAL DATA:  Generalized abdominal pain and vomiting, atrial fibrillation, tachycardia EXAM: CHEST  1 VIEW COMPARISON:  08/28/2020 FINDINGS: The heart size and mediastinal contours are within normal limits. Both lungs are clear. The visualized skeletal structures are unremarkable. IMPRESSION: No active disease. Electronically Signed   By: 08/30/2020 M.D.   On: 12/21/2020 21:06   CT ABDOMEN PELVIS W CONTRAST  Result Date: 12/21/2020 CLINICAL DATA:  Acute abdominal pain. Vomiting. History of pancreatitis. EXAM: CT ABDOMEN AND PELVIS WITH CONTRAST TECHNIQUE: Multidetector CT imaging of the abdomen and pelvis was performed  using the standard protocol following bolus administration of intravenous contrast. CONTRAST:  OMNIPAQUE IOHEXOL 300 MG/ML  SOLN COMPARISON:  Most recent CT 06/29/2020 FINDINGS: Lower chest: Punctate right lower lobe pulmonary nodule, series 4, image 5, stable dating back to at least August 2020 exam and likely benign. No pleural fluid or acute airspace disease. Hepatobiliary: Diffusely decreased hepatic density consistent with steatosis. No focal hepatic abnormality. Unremarkable gallbladder. No gallstone or cholecystitis. There is no biliary dilatation. Pancreas: Mild diffuse peripancreatic fat stranding, most  prominent involving the head and uncinate process. Homogeneous pancreatic enhancement without necrosis. No focal fluid collection. No ductal dilatation. No evidence of pancreatic mass. Spleen: Normal in size without focal abnormality. Adrenals/Urinary Tract: No adrenal nodule. No hydronephrosis or perinephric edema. Homogeneous renal enhancement with symmetric excretion on delayed phase imaging. Tiny low-density lesion in the upper right kidney is too small to characterize. Urinary bladder is only partially distended, no wall thickening for degree of distension. Stomach/Bowel: Fluid distended stomach. No gastric wall thickening. Mild enhancement of the duodenum in the region of pancreatic inflammation, likely reactive. Decompressed small bowel. No small bowel obstruction. Normal appendix is coiled medial to the cecum, series 5, image 34. No colonic wall thickening or inflammation. Proximal sigmoid colon is redundant. Vascular/Lymphatic: Abdominal aorta is normal in caliber. Patent portal and splenic veins. Patent mesenteric vasculature. No acute vascular findings. No enlarged lymph nodes in the abdomen or pelvis. An 8 mm porta hepatis node is likely reactive. Small calcified perihepatic nodes are unchanged. Reproductive: Prostate is unremarkable. Incidental note of bilateral hydroceles as well as right epididymal head cyst, chronic. Other: Fat stranding and minimal free fluid adjacent to the pancreas. No other ascites. No focal fluid collection or abscess. Musculoskeletal: There are no acute or suspicious osseous abnormalities. IMPRESSION: 1. Acute edematous pancreatitis. No evidence of pancreatic necrosis or acute peripancreatic fluid collection. 2. Hepatic steatosis. Electronically Signed   By: Narda Rutherford M.D.   On: 12/21/2020 21:54      IMPRESSION AND PLAN:  Active Problems:   Atrial fibrillation with RVR (HCC)  1.  Atrial fibrillation with a rapid ventricular response. -The patient will be  admitted to a progressive unit bed. -We will continue his IV Cardizem drip. -His potassium and magnesium were within normal. -2D echo and a cardiology consult to be obtained. -I notified Dr. Cristal Deer about the patient.  2.  Acute alcoholic pancreatitis with history of alcohol abuse. -I counseled him for cessation of alcohol. -We will keep him n.p.o. and follow serial lipase levels. -Serial lipase levels will be followed.  3.  Essential hypertension. -We will continue his amlodipine.  4.  GERD with history of recurrent nausea and vomiting. -He will be placed on IV PPI therapy.  DVT prophylaxis: Lovenox  Code Status: full code Family Communication:  The plan of care was discussed in details with the patient (and family). I answered all questions. The patient agreed to proceed with the above mentioned plan. Further management will depend upon hospital course. Disposition Plan: Back to previous home environment Consults called: Cardiology consult to Dr. Cristal Deer. All the records are reviewed and case discussed with ED provider.  Status is: Inpatient  Remains inpatient appropriate because:Hemodynamically unstable, Ongoing diagnostic testing needed not appropriate for outpatient work up, Unsafe d/c plan, IV treatments appropriate due to intensity of illness or inability to take PO and Inpatient level of care appropriate due to severity of illness   Dispo: The patient is from: Home  Anticipated d/c is to: Home              Anticipated d/c date is: 2 days              Patient currently is not medically stable to d/c.   Difficult to place patient No   TOTAL TIME TAKING CARE OF THIS PATIENT: 55 minutes.    Hannah Beat M.D on 12/21/2020 at 10:44 PM  [facility]   From 7 PM-7 AM, contact night-coverage [email or web address]  CC: Primary care physician; Patient, No Pcp Per

## 2020-12-21 NOTE — ED Provider Notes (Signed)
[name] Emergency Department Provider Note  ____________________________________________   Event Date/Time   First MD Initiated Contact with Patient 12/21/20 1921     (approximate)  I have reviewed the triage vital signs and the nursing notes.   HISTORY  Chief Complaint Abdominal Pain and Emesis   HPI SOTA HETZ II is a 50 y.o. male with past medical history of A. Fib not anticoagulated and rate controlled, hep C, and recurrent alcohol induced pancreatitis who presents for assessment of some periumbilical abdominal pain associate with nonbloody nonbilious vomiting that started earlier today.  Patient states he had 1 shot of alcohol last night but nothing else to drink last night or today.  He denies any headache, earache, sore throat, chest pain, cough, shortness of breath, back pain, arm or leg pain, diarrhea, urinary symptoms or any other acute sick symptoms.  States this feels very similar to prior pancreatitis episodes he has had in the past.  He denies taking any medications to arrival.  No other acute concerns at this time.         Past Medical History:  Diagnosis Date  . A-fib (HCC)   . Dental caries   . Hepatitis C infection   . Iritis   . PAF (paroxysmal atrial fibrillation) (HCC)    a. diagnosed 03/19/18; b. CHADS2VASc 0  . Polysubstance abuse (HCC)    a. cocaine, tobacco, etoh  . Scrotal mass   . Tick borne fever   . Uveitis     Patient Active Problem List   Diagnosis Date Noted  . Alcohol-induced pancreatitis 09/28/2020  . Recurrent pancreatitis 06/29/2020  . Alcohol induced acute pancreatitis without necrosis or infection 05/03/2020  . Polysubstance abuse (HCC)   . Hypokalemia   . Alcohol abuse   . Acute pancreatitis 01/12/2020  . Thrombocytopenia (HCC) 06/25/2019  . Elevated troponin   . Pancreatitis 06/23/2019  . AF (paroxysmal atrial fibrillation) (HCC) 09/13/2018  . Atrial fibrillation (HCC) 03/19/2018  .  Abnormality of gait 02/12/2013  . Mandibular fracture (HCC) 12/25/2012  . Flu vaccine need 12/25/2012  . Scrotal mass 05/22/2012  . Dental caries 05/22/2012  . Hepatitis C infection 05/16/2012  . Myalgia 05/06/2012  . Arthralgia 05/06/2012  . Tick borne fever, likely 05/05/2012  . Tinea cruris, likely 05/05/2012  . Spermatocele of epididymis 05/01/2012  . Bilateral varicoceles 05/01/2012  . Bilateral hydrocele 05/01/2012    Past Surgical History:  Procedure Laterality Date  . FINGER SURGERY     infection, middle left  . fractured jaw    . SHOULDER SURGERY     right    Prior to Admission medications   Medication Sig Start Date End Date Taking? Authorizing Provider  amLODipine (NORVASC) 10 MG tablet Take 1 tablet (10 mg total) by mouth daily. Patient not taking: Reported on 09/29/2020 07/03/20   Cipriano Bunker, MD  pantoprazole (PROTONIX) 40 MG tablet Take 1 tablet (40 mg total) by mouth daily. Patient not taking: Reported on 09/29/2020 07/02/20 07/02/21  Cipriano Bunker, MD    Allergies Penicillins  Family History  Problem Relation Age of Onset  . Drug abuse Father     Social History Social History   Tobacco Use  . Smoking status: Current Some Day Smoker    Packs/day: 0.25    Types: Cigarettes  . Smokeless tobacco: Never Used  . Tobacco comment: Per pt  " I only smoke cigarettes when I drink beer"  Vaping Use  . Vaping Use: Never used  Substance Use Topics  . Alcohol use: Yes    Comment: 6 pack per week  . Drug use: Yes    Types: IV, Cocaine    Comment: Pt states that he last used "a while back"    Review of Systems  Review of Systems  Constitutional: Negative for chills and fever.  HENT: Negative for sore throat.   Eyes: Negative for pain.  Respiratory: Negative for cough and stridor.   Cardiovascular: Negative for chest pain.  Gastrointestinal: Positive for abdominal pain and nausea. Negative for vomiting.  Genitourinary: Negative for dysuria.   Musculoskeletal: Negative for myalgias.  Skin: Negative for rash.  Neurological: Negative for seizures, loss of consciousness and headaches.  Psychiatric/Behavioral: Negative for suicidal ideas.  All other systems reviewed and are negative.     ____________________________________________   PHYSICAL EXAM:  VITAL SIGNS: ED Triage Vitals [12/21/20 1710]  Enc Vitals Group     BP 131/86     Pulse Rate 98     Resp (!) 22     Temp 98.6 F (37 C)     Temp Source Oral     SpO2 98 %     Weight 164 lb 14.5 oz (74.8 kg)     Height 5\' 7"  (1.702 m)     Head Circumference      Peak Flow      Pain Score 10     Pain Loc      Pain Edu?      Excl. in GC?    Vitals:   12/21/20 2015 12/21/20 2030  BP:  (!) 131/107  Pulse: 98 90  Resp: 18 (!) 23  Temp:    SpO2: 98% 96%   Physical Exam Vitals and nursing note reviewed.  Constitutional:      General: He is in acute distress.     Appearance: He is well-developed and well-nourished. He is ill-appearing.  HENT:     Head: Normocephalic and atraumatic.     Right Ear: External ear normal.     Left Ear: External ear normal.     Nose: Nose normal.  Eyes:     Conjunctiva/sclera: Conjunctivae normal.  Cardiovascular:     Rate and Rhythm: Normal rate and regular rhythm.     Heart sounds: No murmur heard.   Pulmonary:     Effort: Pulmonary effort is normal. No respiratory distress.     Breath sounds: Normal breath sounds.  Abdominal:     Palpations: Abdomen is soft.     Tenderness: There is abdominal tenderness in the epigastric area. There is no right CVA tenderness or left CVA tenderness.  Musculoskeletal:        General: No edema.     Cervical back: Neck supple.  Skin:    General: Skin is warm and dry.     Capillary Refill: Capillary refill takes less than 2 seconds.  Neurological:     Mental Status: He is alert and oriented to person, place, and time.  Psychiatric:        Mood and Affect: Mood and affect and mood normal.       ____________________________________________   LABS (all labs ordered are listed, but only abnormal results are displayed)  Labs Reviewed  LIPASE, BLOOD - Abnormal; Notable for the following components:      Result Value   Lipase 394 (*)    All other components within normal limits  COMPREHENSIVE METABOLIC PANEL - Abnormal; Notable for the following components:   CO2  18 (*)    Total Protein 9.1 (*)    AST 62 (*)    ALT 53 (*)    All other components within normal limits  CBC - Abnormal; Notable for the following components:   WBC 11.6 (*)    MCHC 36.1 (*)    All other components within normal limits  URINALYSIS, COMPLETE (UACMP) WITH MICROSCOPIC - Abnormal; Notable for the following components:   Color, Urine YELLOW (*)    APPearance CLEAR (*)    Ketones, ur 20 (*)    All other components within normal limits  TSH - Abnormal; Notable for the following components:   TSH 0.219 (*)    All other components within normal limits  TROPONIN I (HIGH SENSITIVITY) - Abnormal; Notable for the following components:   Troponin I (High Sensitivity) 18 (*)    All other components within normal limits  SARS CORONAVIRUS 2 (TAT 6-24 HRS)  MAGNESIUM  T4, FREE   ____________________________________________  EKG  Sinus rhythm with a ventricular of 96, normal axis, unremarkable's, no clear evidence of acute ischemia. ____________________________________________  RADIOLOGY  ED MD interpretation: Chest x-ray has no effusion, edema, pneumothorax, consolidation or other clear acute thoracic process.   Official radiology report(s): DG Chest 1 View  Result Date: 12/21/2020 CLINICAL DATA:  Generalized abdominal pain and vomiting, atrial fibrillation, tachycardia EXAM: CHEST  1 VIEW COMPARISON:  08/28/2020 FINDINGS: The heart size and mediastinal contours are within normal limits. Both lungs are clear. The visualized skeletal structures are unremarkable. IMPRESSION: No active disease.  Electronically Signed   By: Sharlet SalinaMichael  Brown M.D.   On: 12/21/2020 21:06    ____________________________________________   PROCEDURES  Procedure(s) performed (including Critical Care):  .Critical Care Performed by: Gilles ChiquitoSmith, Venesha Petraitis P, MD Authorized by: Gilles ChiquitoSmith, Tarnesha Ulloa P, MD   Critical care provider statement:    Critical care time (minutes):  45   Critical care was necessary to treat or prevent imminent or life-threatening deterioration of the following conditions:  Cardiac failure   Critical care was time spent personally by me on the following activities:  Discussions with consultants, evaluation of patient's response to treatment, examination of patient, ordering and performing treatments and interventions, ordering and review of laboratory studies, ordering and review of radiographic studies, pulse oximetry, re-evaluation of patient's condition, obtaining history from patient or surrogate and review of old charts     ____________________________________________   INITIAL IMPRESSION / ASSESSMENT AND PLAN / ED COURSE      Patient presents with above to history exam for assessment of acute onset of periumbilical abdominal pain associate with nonbloody nonbilious vomiting in the setting of history of recurrent alcohol induced pancreatitis and history of drinking at least 1 time alcohol at night.  On arrival he is afebrile hemodynamically stable.  He appears quite uncomfortable and is very tender in his epigastrium  Primary differential includes but is not limited to acute pancreatitis, cholecystitis, diverticulitis, perforated viscus, kidney stone, appendicitis and possible atypical presentation of ACS.  In addition while undergoing initial evaluation emergency room patient's heart rate was noted to suddenly increase in the 170s.  Repeat ECG shows evidence of A. fib with a rapid ventricular response with a ventricular rate of 174 as well as a left posterior fascicle block and some  nonspecific T wave changes throughout.  Patient does have a history of A. fib I suspect is A. fib with RVR is likely in response to his pancreatitis.  Chest x-ray shows no clear acute thoracic process and  troponin is close to baseline.  He was placed on diltiazem drip and rate controlled.  Troponin obtained is 18 compared to 23 2 months ago.  We will plan to trend this.  Also obtain TSH.  Regard to his abdominal pain lipase is 394 consistent with acute pancreatitis.  CMP shows no significant electrolyte or metabolic derangements aside from a bicarb of 18 and anion gap of 15.  There is also mild transaminitis but no other signs of cholestasis as bilirubin is 1.  Low suspicion for cholecystitis.  CBC shows leukocytosis with WBC count of 11.6 and hemoglobin of 10.9 with unremarkable platelets.  We will obtain CT abdomen pelvis to assess for any other acute complication associated with likely acute pancreatitis.  I suspect this is related to recent alcohol use.  Care patient signed over to Dr. Larinda Buttery at approximately 2200.  Plan is to follow-up CT and admit to medicine.       ____________________________________________   FINAL CLINICAL IMPRESSION(S) / ED DIAGNOSES  Final diagnoses:  Alcohol-induced acute pancreatitis, unspecified complication status  Atrial fibrillation with RVR (HCC)    Medications  lactated ringers bolus 1,000 mL (has no administration in time range)  diltiazem (CARDIZEM) 1 mg/mL load via infusion 10 mg (10 mg Intravenous Bolus from Bag 12/21/20 2001)    And  diltiazem (CARDIZEM) 125 mg in dextrose 5% 125 mL (1 mg/mL) infusion (15 mg/hr Intravenous Rate/Dose Change 12/21/20 2026)  ondansetron (ZOFRAN-ODT) disintegrating tablet 4 mg (4 mg Oral Given 12/21/20 1714)  HYDROmorphone (DILAUDID) injection 1 mg (1 mg Intravenous Given 12/21/20 2000)  HYDROmorphone (DILAUDID) injection 1 mg (1 mg Intravenous Given 12/21/20 2112)  ondansetron (ZOFRAN) injection 4 mg (4 mg Intravenous  Given 12/21/20 2128)  iohexol (OMNIPAQUE) 300 MG/ML solution 100 mL (100 mLs Intravenous Contrast Given 12/21/20 2141)     ED Discharge Orders    None       Note:  This document was prepared using Dragon voice recognition software and may include unintentional dictation errors.   Gilles Chiquito, MD 12/21/20 2155

## 2020-12-21 NOTE — ED Triage Notes (Addendum)
Pt to ED via POV with c/o generalized abdominal pain and vomiting that started this morning. Pt states hx of pancreatitis, pt with several admissions for ETOH induced pancreatitis, pt with active dry heaving in triage.

## 2020-12-22 ENCOUNTER — Inpatient Hospital Stay (HOSPITAL_COMMUNITY)
Admit: 2020-12-22 | Discharge: 2020-12-22 | Disposition: A | Discharge status: Home / Self Care | Payer: Self-pay | Attending: Family Medicine | Admitting: Family Medicine

## 2020-12-22 DIAGNOSIS — I4891 Unspecified atrial fibrillation: Secondary | ICD-10-CM

## 2020-12-22 LAB — CBC
HCT: 40.4 % (ref 39.0–52.0)
Hemoglobin: 14.2 g/dL (ref 13.0–17.0)
MCH: 31.8 pg (ref 26.0–34.0)
MCHC: 35.1 g/dL (ref 30.0–36.0)
MCV: 90.4 fL (ref 80.0–100.0)
Platelets: 197 10*3/uL (ref 150–400)
RBC: 4.47 MIL/uL (ref 4.22–5.81)
RDW: 12.8 % (ref 11.5–15.5)
WBC: 7.1 10*3/uL (ref 4.0–10.5)
nRBC: 0 % (ref 0.0–0.2)

## 2020-12-22 LAB — ECHOCARDIOGRAM COMPLETE
AR max vel: 2.46 cm2
AV Area VTI: 2.45 cm2
AV Area mean vel: 2.38 cm2
AV Mean grad: 2 mmHg
AV Peak grad: 3.4 mmHg
Ao pk vel: 0.92 m/s
Area-P 1/2: 3.39 cm2
Height: 67 in
S' Lateral: 3.14 cm
Weight: 2638.47 oz

## 2020-12-22 LAB — URINE DRUG SCREEN, QUALITATIVE (ARMC ONLY)
Amphetamines, Ur Screen: NOT DETECTED
Barbiturates, Ur Screen: NOT DETECTED
Benzodiazepine, Ur Scrn: NOT DETECTED
Cannabinoid 50 Ng, Ur ~~LOC~~: NOT DETECTED
Cocaine Metabolite,Ur ~~LOC~~: POSITIVE — AB
MDMA (Ecstasy)Ur Screen: NOT DETECTED
Methadone Scn, Ur: NOT DETECTED
Opiate, Ur Screen: NOT DETECTED
Phencyclidine (PCP) Ur S: NOT DETECTED
Tricyclic, Ur Screen: NOT DETECTED

## 2020-12-22 LAB — SARS CORONAVIRUS 2 (TAT 6-24 HRS): SARS Coronavirus 2: NEGATIVE

## 2020-12-22 LAB — BASIC METABOLIC PANEL
Anion gap: 8 (ref 5–15)
BUN: 7 mg/dL (ref 6–20)
CO2: 25 mmol/L (ref 22–32)
Calcium: 8.2 mg/dL — ABNORMAL LOW (ref 8.9–10.3)
Chloride: 107 mmol/L (ref 98–111)
Creatinine, Ser: 0.61 mg/dL (ref 0.61–1.24)
GFR, Estimated: >60 mL/min (ref >60)
Glucose, Bld: 108 mg/dL — ABNORMAL HIGH (ref 70–99)
Potassium: 3.4 mmol/L — ABNORMAL LOW (ref 3.5–5.1)
Sodium: 140 mmol/L (ref 135–145)

## 2020-12-22 LAB — LIPID PANEL
Cholesterol: 125 mg/dL (ref 0–200)
HDL: 66 mg/dL (ref >40)
LDL Cholesterol: 48 mg/dL (ref 0–99)
Total CHOL/HDL Ratio: 1.9 RATIO
Triglycerides: 57 mg/dL (ref <150)
VLDL: 11 mg/dL (ref 0–40)

## 2020-12-22 LAB — HEMOGLOBIN A1C
Hgb A1c MFr Bld: 5.8 % — ABNORMAL HIGH (ref 4.8–5.6)
Mean Plasma Glucose: 120 mg/dL

## 2020-12-22 LAB — PHOSPHORUS: Phosphorus: 3.2 mg/dL (ref 2.5–4.6)

## 2020-12-22 LAB — LIPASE, BLOOD: Lipase: 140 U/L — ABNORMAL HIGH (ref 11–51)

## 2020-12-22 MED ORDER — DIPHENHYDRAMINE HCL 25 MG PO CAPS
ORAL_CAPSULE | ORAL | Status: AC
  Filled 2020-12-22: qty 1

## 2020-12-22 MED ORDER — THIAMINE HCL 100 MG/ML IJ SOLN
100.0000 mg | Freq: Every day | INTRAMUSCULAR | Status: DC
  Filled 2020-12-22: qty 2

## 2020-12-22 MED ORDER — LORAZEPAM 2 MG/ML IJ SOLN: 1.0000 mg | INTRAMUSCULAR | Status: AC | PRN

## 2020-12-22 MED ORDER — HYDROMORPHONE HCL 1 MG/ML IJ SOLN
0.5000 mg | INTRAMUSCULAR | Status: DC | PRN
  Administered 2020-12-22 (×2): 0.5 mg via INTRAVENOUS
  Filled 2020-12-22 (×2): qty 1

## 2020-12-22 MED ORDER — HYDROMORPHONE HCL 1 MG/ML IJ SOLN
0.5000 mg | Freq: Four times a day (QID) | INTRAMUSCULAR | Status: DC | PRN
  Administered 2020-12-22 – 2020-12-23 (×3): 0.5 mg via INTRAVENOUS
  Filled 2020-12-22 (×3): qty 1

## 2020-12-22 MED ORDER — FOLIC ACID 1 MG PO TABS
1.0000 mg | ORAL_TABLET | Freq: Every day | ORAL | Status: DC
  Administered 2020-12-22 – 2020-12-26 (×5): 1 mg via ORAL
  Filled 2020-12-22 (×5): qty 1

## 2020-12-22 MED ORDER — ADULT MULTIVITAMIN W/MINERALS CH
1.0000 | ORAL_TABLET | Freq: Every day | ORAL | Status: DC
  Administered 2020-12-22 – 2020-12-26 (×5): 1 via ORAL
  Filled 2020-12-22 (×5): qty 1

## 2020-12-22 MED ORDER — THIAMINE HCL 100 MG PO TABS
100.0000 mg | ORAL_TABLET | Freq: Every day | ORAL | Status: DC
  Administered 2020-12-22 – 2020-12-26 (×5): 100 mg via ORAL
  Filled 2020-12-22 (×5): qty 1

## 2020-12-22 MED ORDER — HYDROCODONE-ACETAMINOPHEN 5-325 MG PO TABS
1.0000 | ORAL_TABLET | ORAL | Status: DC | PRN
  Administered 2020-12-22 – 2020-12-24 (×7): 1 via ORAL
  Filled 2020-12-22 (×8): qty 1

## 2020-12-22 MED ORDER — DILTIAZEM HCL 30 MG PO TABS
30.0000 mg | ORAL_TABLET | Freq: Four times a day (QID) | ORAL | Status: DC
  Administered 2020-12-22 – 2020-12-23 (×2): 30 mg via ORAL
  Filled 2020-12-22 (×2): qty 1

## 2020-12-22 MED ORDER — LORAZEPAM 1 MG PO TABS: 1.0000 mg | ORAL_TABLET | ORAL | Status: AC | PRN

## 2020-12-22 MED ORDER — LORAZEPAM 2 MG/ML IJ SOLN
INTRAMUSCULAR | Status: AC
  Administered 2020-12-22: 2 mg
  Filled 2020-12-22: qty 1

## 2020-12-22 NOTE — Consult Note (Signed)
Cardiology Consultation:   [name] MRN: [id number]; DOB: [birth year]  Admit date: 12/21/2020 Date of Consult: 12/22/2020  PCP:  Patient, No Pcp Per   [facility]  Cardiologist:  [facility] Provider:  No care team member to display Electrophysiologist:  None  :[id number]}   Patient Profile:   Bruce [name] is a 50 y.o. male with a hx of paroxysmal atrial fibrillation not on OAC, polysubstance abuse (IV drug use, cocaine use), hepatitis C from IV drug use, alcoholism, prior incarceration, and noncompliance who is being seen today for the evaluation of atrial fibrillation with RVR at the request of Dr. Natale Milch.  History of Present Illness:   Bruce [name] has been seen by [facility] for the above cardiac issues.  Patient was first diagnosed with atrial fibrillation 07/12/2018. He presented to the ER with new onset A. fib RVR after heavy drinking. He was positive for cocaine at that time.  Echo showed LVEF 60 to 65%, no wall motion abnormality, grade 2 diastolic dysfunction, mild MR.  Patient converted to sinus rhythm with IV Cardizem overnight. CHA2DS2-VASc was zero so he was not started on anticoagulation at that time.  Patient was seen multiple times in the ER for chest pain with negative work-up.  Patient was admitted again November 2019 with A. fib RVR with mildly elevated troponin.  Cardiology saw him and he was started on flecainide 50 mg twice daily and continued on Cardizem 180 mg daily.  He converted to sinus rhythm prior to discharge.  He was not placed on anticoagulation.  Patient was seen in the ED September 17, 2018 for chest pain troponin 0 0.03.  EKG was sinus rhythm.  UDS positive for cocaine.  He was discharged outpatient follow-up and scheduled for an outpatient [facility], which was completed on 10/08/2018 and was normal.  Patient was seen in follow-up 10/16/2018 and reported compliance with his medications.  The patient  reports he drinks beer daily, a 12 pack every day. Denies smoking history, Admits to cocaine use but is not sure the last time he used.   Patient was seen last on August 2020 during an admission for abdominal pain diagnosed with acute pancreatitis.  Cardiology saw for A. fib and minimally elevated troponin.  No further ischemic evaluation was recommended.  Patient reported noncompliance with medications.  Was not started on anticoagulation with a CHA2DS2-VASc of zero and thrombocytopenia.  Echo showed LVEF 55 to 60.  The patient presented to the ER 12/21/2020 for abdominal pain for the last 2 days. History of difficult to obtain due to lethargy and some incoherent speech. He reported acute onset abdominal pain with nausea and  vomiting. Says abdominal pain is all over. His last drink of alcohol was ?12/20/2020.  Also reported palpitations. No chest pain. Denied fever chills, no diarrhea or bright red blood per rectum. Denies shortness of breath.  The ED he was noted to be in A. fib RVR with heart rate up to 200.  Labs showed lipase 394, AST 62, ALT 53, total bili 9.1, albumin 4.4.  Magnesium 1.9, potassium 3.0, creatinine 0.71. HS troponin 18.  CBC C showed mild leukocytosis of 11.6.  TSH low at 0.19 and free T4 0.18.  Chest x-ray showed no acute disease. CT abdomen and pelvis showed acute edematous pancreatitis. The patient was given 10 mg of IV dill followed by IV dill drip, Dilaudid, IV fluids and Zofran. In the ER patient converted to SR  with PACs/PVCs.    Past Medical History:  Diagnosis Date  . A-fib (HCC)   . Dental caries   . Hepatitis C infection   . Iritis   . PAF (paroxysmal atrial fibrillation) (HCC)    a. diagnosed 03/19/18; b. CHADS2VASc 0  . Polysubstance abuse (HCC)    a. cocaine, tobacco, etoh  . Scrotal mass   . Tick borne fever   . Uveitis     Past Surgical History:  Procedure Laterality Date  . FINGER SURGERY     infection, middle left  . fractured jaw    . SHOULDER  SURGERY     right     Home Medications:  Prior to Admission medications   Medication Sig Start Date End Date Taking? Authorizing Provider  amLODipine (NORVASC) 10 MG tablet Take 1 tablet (10 mg total) by mouth daily. Patient not taking: No sig reported 07/03/20   Cipriano Bunker, MD  pantoprazole (PROTONIX) 40 MG tablet Take 1 tablet (40 mg total) by mouth daily. Patient not taking: No sig reported 07/02/20 07/02/21  Cipriano Bunker, MD    Inpatient Medications: Scheduled Meds: . enoxaparin (LOVENOX) injection  40 mg Subcutaneous Q24H  . folic acid  1 mg Oral Daily  . multivitamin with minerals  1 tablet Oral Daily  . thiamine  100 mg Oral Daily   Or  . thiamine  100 mg Intravenous Daily   Continuous Infusions: . sodium chloride 100 mL/hr at 12/21/20 2344  . diltiazem (CARDIZEM) infusion     PRN Meds: acetaminophen, diphenhydrAMINE, HYDROmorphone (DILAUDID) injection, LORazepam **OR** LORazepam, ondansetron (ZOFRAN) IV, promethazine  Allergies:    Allergies  Allergen Reactions  . Penicillins Other (See Comments)    Did it involve swelling of the face/tongue/throat, SOB, or low BP? No Did it involve sudden or severe rash/hives, skin peeling, or any reaction on the inside of your mouth or nose? No Did you need to seek medical attention at a hospital or doctor's office? No When did it last happen? If all above answers are "NO", may proceed with cephalosporin use.    Social History:   Social History   Socioeconomic History  . Marital status: Single    Spouse name: Not on file  . Number of children: 0  . Years of education: Not on file  . Highest education level: Not on file  Occupational History    Employer: UNEMPLOYE  Tobacco Use  . Smoking status: Current Some Day Smoker    Packs/day: 0.25    Types: Cigarettes  . Smokeless tobacco: Never Used  . Tobacco comment: Per pt  " I only smoke cigarettes when I drink beer"  Vaping Use  . Vaping Use: Never used   Substance and Sexual Activity  . Alcohol use: Yes    Comment: 6 pack per week  . Drug use: Yes    Types: IV, Cocaine    Comment: Pt states that he last used "a while back"  . Sexual activity: Not on file  Other Topics Concern  . Not on file  Social History Narrative  . Not on file   Social Determinants of Health   Financial Resource Strain: Not on file  Food Insecurity: Not on file  Transportation Needs: Not on file  Physical Activity: Not on file  Stress: Not on file  Social Connections: Not on file  Intimate Partner Violence: Not on file    Family History:   Family History  Problem Relation Age of Onset  .  Drug abuse Father      ROS:  Please see the history of present illness.  All other ROS reviewed and negative.     Physical Exam/Data:   Vitals:   12/22/20 0130 12/22/20 0457 12/22/20 0500 12/22/20 0530  BP: 132/81 128/82 126/85 124/87  Pulse: 81 62 (!) 58 (!) 59  Resp: (!) 21  13 11   Temp:      TempSrc:      SpO2: 99%  99% 98%  Weight:      Height:        Intake/Output Summary (Last 24 hours) at 12/22/2020 0808 Last data filed at 12/22/2020 0157 Gross per 24 hour  Intake 1070.25 ml  Output 500 ml  Net 570.25 ml   Last 3 Weights 12/21/2020 09/28/2020 08/28/2020  Weight (lbs) 164 lb 14.5 oz 165 lb 165 lb  Weight (kg) 74.8 kg 74.844 kg 74.844 kg     Body mass index is 25.83 kg/m.  General: in no acute distress HEENT: normal Lymph: no adenopathy Neck: + JVD Endocrine:  No thryomegaly Vascular: No carotid bruits; FA pulses 2+ bilaterally without bruits  Cardiac:  normal S1, S2; RRR; no murmur  Lungs:  Diffusely diminished Abd: soft, nontender, no hepatomegaly  Ext: Trace lower leg edema Musculoskeletal:  No deformities, BUE and BLE strength normal and equal Skin: warm and dry  Neuro:  lethargic Psych:  Normal affect   EKG:  The EKG was personally reviewed and demonstrates:  NSR, 96bpm Telemetry:  Telemetry was personally reviewed and  demonstrates:  Afib with RVR rates up to 200s>>NSR/SB with PACs. PVCs, HR 50-60s  Relevant CV Studies:  Echo ordered  Echo 06/2019 1. The left ventricle has normal systolic function, with an ejection  fraction of 55-60%. The cavity size was normal. Left ventricular diastolic  parameters were normal.  2. The right ventricle has normal systolic function. The cavity was  normal. There is no increase in right ventricular wall thickness. Right  ventricular systolic pressure is normal with an estimated pressure of 20.6  mmHg.  3. No evidence of mitral valve stenosis.  4. The tricuspid valve is grossly normal.  5. The aortic valve is grossly normal. No stenosis of the aortic valve.  6. The aorta is normal unless otherwise noted.   Heart monitor 10/2018 Event monitor  Normal sinus rhythm Avg HR of 85 bpm.  25 Supraventricular Tachycardia runs occurred, the run with the fastest interval lasting 5 beats with a max rate of 207 bpm, the longest lasting 14 beats with an avg rate of 156 bpm.   Isolated SVEs were occasional (1.1%, 15308), SVE Couplets were rare (<1.0%, 137), and SVE Triplets were rare (<1.0%, 42). Isolated VEs were rare (<1.0%), VE Couplets were rare (<1.0%), and no VE Triplets were present. Ventricular Bigeminy was present.  Patient triggered events were not associated with significant arrhythmia  Signed, Dossie Arbour, MD, Ph.D [facility]  Myoview lexiscan 10/2018 Narrative & Impression   T wave inversion was noted during stress in the III leads.  There was no ST segment deviation noted during stress.  The study is normal.  This is a low risk study.  Nuclear stress EF: 50%.      Laboratory Data:  High Sensitivity Troponin:   Recent Labs  Lab 12/21/20 2018  TROPONINIHS 18*     Chemistry Recent Labs  Lab 12/21/20 1731 12/22/20 0506  NA 138 140  K 3.9 3.4*  CL 105 107  CO2 18* 25  GLUCOSE  76 108*  BUN 7 7  CREATININE 0.71 0.61   CALCIUM 9.5 8.2*  GFRNONAA >60 >60  ANIONGAP 15 8    Recent Labs  Lab 12/21/20 1731  PROT 9.1*  ALBUMIN 4.4  AST 62*  ALT 53*  ALKPHOS 99  BILITOT 1.0   Hematology Recent Labs  Lab 12/21/20 1731 12/22/20 0506  WBC 11.6* 7.1  RBC 5.24 4.47  HGB 16.9 14.2  HCT 46.8 40.4  MCV 89.3 90.4  MCH 32.3 31.8  MCHC 36.1* 35.1  RDW 12.9 12.8  PLT 253 197   BNPNo results for input(s): BNP, PROBNP in the last 168 hours.  DDimer No results for input(s): DDIMER in the last 168 hours.   Radiology/Studies:  DG Chest 1 View  Result Date: 12/21/2020 CLINICAL DATA:  Generalized abdominal pain and vomiting, atrial fibrillation, tachycardia EXAM: CHEST  1 VIEW COMPARISON:  08/28/2020 FINDINGS: The heart size and mediastinal contours are within normal limits. Both lungs are clear. The visualized skeletal structures are unremarkable. IMPRESSION: No active disease. Electronically Signed   By: Sharlet Salina M.D.   On: 12/21/2020 21:06   CT ABDOMEN PELVIS W CONTRAST  Result Date: 12/21/2020 CLINICAL DATA:  Acute abdominal pain. Vomiting. History of pancreatitis. EXAM: CT ABDOMEN AND PELVIS WITH CONTRAST TECHNIQUE: Multidetector CT imaging of the abdomen and pelvis was performed using the standard protocol following bolus administration of intravenous contrast. CONTRAST:  OMNIPAQUE IOHEXOL 300 MG/ML  SOLN COMPARISON:  Most recent CT 06/29/2020 FINDINGS: Lower chest: Punctate right lower lobe pulmonary nodule, series 4, image 5, stable dating back to at least August 2020 exam and likely benign. No pleural fluid or acute airspace disease. Hepatobiliary: Diffusely decreased hepatic density consistent with steatosis. No focal hepatic abnormality. Unremarkable gallbladder. No gallstone or cholecystitis. There is no biliary dilatation. Pancreas: Mild diffuse peripancreatic fat stranding, most prominent involving the head and uncinate process. Homogeneous pancreatic enhancement without necrosis. No focal  fluid collection. No ductal dilatation. No evidence of pancreatic mass. Spleen: Normal in size without focal abnormality. Adrenals/Urinary Tract: No adrenal nodule. No hydronephrosis or perinephric edema. Homogeneous renal enhancement with symmetric excretion on delayed phase imaging. Tiny low-density lesion in the upper right kidney is too small to characterize. Urinary bladder is only partially distended, no wall thickening for degree of distension. Stomach/Bowel: Fluid distended stomach. No gastric wall thickening. Mild enhancement of the duodenum in the region of pancreatic inflammation, likely reactive. Decompressed small bowel. No small bowel obstruction. Normal appendix is coiled medial to the cecum, series 5, image 34. No colonic wall thickening or inflammation. Proximal sigmoid colon is redundant. Vascular/Lymphatic: Abdominal aorta is normal in caliber. Patent portal and splenic veins. Patent mesenteric vasculature. No acute vascular findings. No enlarged lymph nodes in the abdomen or pelvis. An 8 mm porta hepatis node is likely reactive. Small calcified perihepatic nodes are unchanged. Reproductive: Prostate is unremarkable. Incidental note of bilateral hydroceles as well as right epididymal head cyst, chronic. Other: Fat stranding and minimal free fluid adjacent to the pancreas. No other ascites. No focal fluid collection or abscess. Musculoskeletal: There are no acute or suspicious osseous abnormalities. IMPRESSION: 1. Acute edematous pancreatitis. No evidence of pancreatic necrosis or acute peripancreatic fluid collection. 2. Hepatic steatosis. Electronically Signed   By: Narda Rutherford M.D.   On: 12/21/2020 21:54     Assessment and Plan:   A. fib with RVR History of PAF - Patient presented with abdominal pain and palpitations found to have acute pancreatitis and A. fib  RVR with rates up to 200.  He was started on IV dilt drip. Patient converted to SR in the ER. - patient first diagnosed  back in 2019. Treatment has been difficult due to a history of noncompliance and substance use. He has not been started on OAC due to CHADSVASC of 0 - keep potassium > 4 and mag> 2 - TSH 0.219, FT4 wnl - echo ordered . Last echo 06/2019 showed LVEF 55-60%. Patient has trace edema on exam. - No BB with hx of cocaine use. Also heart rates 50-60s in SR - CHADSVASC at least 1 with HTN. Echo pending  Acute pancreatitis due to alcohol use History of alcohol abuse - CT abd/pelvis with acute pancreatitis. Lipase elevated  - last reported drink 2/13 - CIWA protocal - per IM  Hypertension -Home amlodipine continued - IV dilt - Bps good  History of substance abuse - will check UDS - history of cocaine use, unsure of his last use   Risk Assessment/Risk Scores:  {  For questions or updates, please contact [facility] Please consult [email or web address] for contact info under    Signed, Mireyah Chervenak David Stall, PA-C  12/22/2020 8:08 AM

## 2020-12-22 NOTE — Progress Notes (Signed)
*  PRELIMINARY RESULTS* Echocardiogram 2D Echocardiogram has been performed.  Bruce [name] 12/22/2020, 1:47 PM

## 2020-12-22 NOTE — ED Notes (Signed)
Per lab, will add on A1C, lipid panel, and drug screen to previously sent specimens at this time.

## 2020-12-22 NOTE — ED Notes (Signed)
Pt given unit phone per request at this time.

## 2020-12-22 NOTE — Progress Notes (Signed)
Pt arrived to the unit.  He appears to be in no apparent distress Pt sleepy but easy to arouse Alert and oriented. IV infusing Pt educated on NPO status and plan of care Verbalizes an understanding.  Time allowed for questions and concerns Denies any additional wants or needs at this time Call bell within reach. Will continue to monitor.

## 2020-12-22 NOTE — ED Notes (Signed)
Pt resting in NAD. Denies any needs at this time.

## 2020-12-22 NOTE — Progress Notes (Signed)
PROGRESS NOTE    MARC LEICHTER II  WVP:[id number] DOB: 01-09-1971 DOA: 12/21/2020 PCP: Patient, No Pcp Per   Brief Narrative:  VERLE WHEELING II is a 50 y.o. African-American male with medical history significant for paroxysmal atrial fibrillation, polysubstance abuse, alcoholic pancreatitis, and hepatitis C, who presented to the emergency room with acute onset of left upper quadrant and epigastric abdominal pain that started this morning after consuming alcohol.  In the ED patient was noted to be in A. fib with RVR; CT abdomen pelvis scan showed acute edematous pancreatitis with no pancreatic necrosis or peripancreatic fluid collection.  Hospitalist called for admission.   Assessment & Plan:   Active Problems:   Atrial fibrillation with RVR (HCC)   Acute alcoholic pancreatitis with history of alcohol abuse. -Recurrent, patient had previous episode of pancreatitis on 5 months ago, knows alcohol is a triggering factor -Lengthy education at bedside about complete alcohol cessation at this point given he states he only had two drinks with moderate to severe pancreatitis on imaging -Lipase downtrending appropriately currently 140(394 at intake), continue IV fluids, increase p.o. intake appropriately -Wean off Dilaudid, given low lipase today -transition to p.o. narcotics, would be hesitant to discharge on narcotics given cocaine positive UDS and concern for seeking behavior given patient continues to request increasing doses of pain medication today while having difficulty maintaining awake mental status this morning -CIWA protocol ongoing  Acute paroxysmal atrial fibrillation with a rapid ventricular response. - Cardiology following - Echo normal EF 55 to 60% 2020, repeat echo pending -Off diltiazem drip this morning -converted to sinus rhythm, defer to cardiology for further rate control if necessary  Essential hypertension -Continue home amlodipine  GERD with history of recurrent  nausea and vomiting. -He will be placed on IV PPI therapy.  DVT prophylaxis: Lovenox  Code Status: full code Family Communication: None available  Status is: Inpatient  Dispo: The patient is from: Home              Anticipated d/c is to: Home              Anticipated d/c date is: 24 to 48 hours              Patient currently not medically stable for discharge  Consultants:   Cardiology  Procedures:   None  Antimicrobials:  None  Subjective: No acute issues or events overnight, nausea vomiting resolved, pain moderately well controlled  Objective: Vitals:   12/22/20 0130 12/22/20 0457 12/22/20 0500 12/22/20 0530  BP: 132/81 128/82 126/85 124/87  Pulse: 81 62 (!) 58 (!) 59  Resp: (!) 21  13 11   Temp:      TempSrc:      SpO2: 99%  99% 98%  Weight:      Height:        Intake/Output Summary (Last 24 hours) at 12/22/2020 0736 Last data filed at 12/22/2020 0157 Gross per 24 hour  Intake 1070.25 ml  Output 500 ml  Net 570.25 ml   Filed Weights   12/21/20 1710  Weight: 74.8 kg    Examination:  General exam: Appears calm and comfortable  Respiratory system: Clear to auscultation. Respiratory effort normal. Cardiovascular system: S1 & S2 heard, RRR. No JVD, murmurs, rubs, gallops or clicks. No pedal edema. Gastrointestinal system: Abdomen is nondistended, soft and nontender. No organomegaly or masses felt. Normal bowel sounds heard. Central nervous system: Alert and oriented. No focal neurological deficits. Extremities: Symmetric 5 x 5 power. Skin:  No rashes, lesions or ulcers Psychiatry: Judgement and insight appear normal. Mood & affect appropriate.     Data Reviewed: I have personally reviewed following labs and imaging studies  CBC: Recent Labs  Lab 12/21/20 1731 12/22/20 0506  WBC 11.6* 7.1  HGB 16.9 14.2  HCT 46.8 40.4  MCV 89.3 90.4  PLT 253 197   Basic Metabolic Panel: Recent Labs  Lab 12/21/20 1731 12/21/20 2018 12/22/20 0506  NA 138   --  140  K 3.9  --  3.4*  CL 105  --  107  CO2 18*  --  25  GLUCOSE 76  --  108*  BUN 7  --  7  CREATININE 0.71  --  0.61  CALCIUM 9.5  --  8.2*  MG  --  1.9  --   PHOS  --   --  3.2   GFR: Estimated Creatinine Clearance: 102.1 mL/min (by C-G formula based on SCr of 0.61 mg/dL). Liver Function Tests: Recent Labs  Lab 12/21/20 1731  AST 62*  ALT 53*  ALKPHOS 99  BILITOT 1.0  PROT 9.1*  ALBUMIN 4.4   Recent Labs  Lab 12/21/20 1731 12/22/20 0506  LIPASE 394* 140*   No results for input(s): AMMONIA in the last 168 hours. Coagulation Profile: No results for input(s): INR, PROTIME in the last 168 hours. Cardiac Enzymes: No results for input(s): CKTOTAL, CKMB, CKMBINDEX, TROPONINI in the last 168 hours. BNP (last 3 results) No results for input(s): PROBNP in the last 8760 hours. HbA1C: No results for input(s): HGBA1C in the last 72 hours. CBG: No results for input(s): GLUCAP in the last 168 hours. Lipid Profile: No results for input(s): CHOL, HDL, LDLCALC, TRIG, CHOLHDL, LDLDIRECT in the last 72 hours. Thyroid Function Tests: Recent Labs    12/21/20 1731 12/21/20 2018  TSH 0.219*  --   FREET4  --  0.81   Anemia Panel: No results for input(s): VITAMINB12, FOLATE, FERRITIN, TIBC, IRON, RETICCTPCT in the last 72 hours. Sepsis Labs: No results for input(s): PROCALCITON, LATICACIDVEN in the last 168 hours.  Recent Results (from the past 240 hour(s))  SARS CORONAVIRUS 2 (TAT 6-24 HRS) Nasopharyngeal Nasopharyngeal Swab     Status: None   Collection Time: 12/21/20  8:10 PM   Specimen: Nasopharyngeal Swab  Result Value Ref Range Status   SARS Coronavirus 2 NEGATIVE NEGATIVE Final    Comment: (NOTE) SARS-CoV-2 target nucleic acids are NOT DETECTED.  The SARS-CoV-2 RNA is generally detectable in upper and lower respiratory specimens during the acute phase of infection. Negative results do not preclude SARS-CoV-2 infection, do not rule out co-infections with other  pathogens, and should not be used as the sole basis for treatment or other patient management decisions. Negative results must be combined with clinical observations, patient history, and epidemiological information. The expected result is Negative.  Fact Sheet for Patients: HairSlick.no  Fact Sheet for Healthcare Providers: quierodirigir.com  This test is not yet approved or cleared by the [place] FDA and  has been authorized for detection and/or diagnosis of SARS-CoV-2 by FDA under an Emergency Use Authorization (EUA). This EUA will remain  in effect (meaning this test can be used) for the duration of the COVID-19 declaration under Se ction 564(b)(1) of the Act, 21 U.S.C. section 360bbb-3(b)(1), unless the authorization is terminated or revoked sooner.  Performed at [facility], [address] [place], [place] [address]          Radiology Studies: DG  Chest 1 View  Result Date: 12/21/2020 CLINICAL DATA:  Generalized abdominal pain and vomiting, atrial fibrillation, tachycardia EXAM: CHEST  1 VIEW COMPARISON:  08/28/2020 FINDINGS: The heart size and mediastinal contours are within normal limits. Both lungs are clear. The visualized skeletal structures are unremarkable. IMPRESSION: No active disease. Electronically Signed   By: Sharlet Salina M.D.   On: 12/21/2020 21:06   CT ABDOMEN PELVIS W CONTRAST  Result Date: 12/21/2020 CLINICAL DATA:  Acute abdominal pain. Vomiting. History of pancreatitis. EXAM: CT ABDOMEN AND PELVIS WITH CONTRAST TECHNIQUE: Multidetector CT imaging of the abdomen and pelvis was performed using the standard protocol following bolus administration of intravenous contrast. CONTRAST:  OMNIPAQUE IOHEXOL 300 MG/ML  SOLN COMPARISON:  Most recent CT 06/29/2020 FINDINGS: Lower chest: Punctate right lower lobe pulmonary nodule, series 4, image 5, stable dating back to at least August 2020 exam  and likely benign. No pleural fluid or acute airspace disease. Hepatobiliary: Diffusely decreased hepatic density consistent with steatosis. No focal hepatic abnormality. Unremarkable gallbladder. No gallstone or cholecystitis. There is no biliary dilatation. Pancreas: Mild diffuse peripancreatic fat stranding, most prominent involving the head and uncinate process. Homogeneous pancreatic enhancement without necrosis. No focal fluid collection. No ductal dilatation. No evidence of pancreatic mass. Spleen: Normal in size without focal abnormality. Adrenals/Urinary Tract: No adrenal nodule. No hydronephrosis or perinephric edema. Homogeneous renal enhancement with symmetric excretion on delayed phase imaging. Tiny low-density lesion in the upper right kidney is too small to characterize. Urinary bladder is only partially distended, no wall thickening for degree of distension. Stomach/Bowel: Fluid distended stomach. No gastric wall thickening. Mild enhancement of the duodenum in the region of pancreatic inflammation, likely reactive. Decompressed small bowel. No small bowel obstruction. Normal appendix is coiled medial to the cecum, series 5, image 34. No colonic wall thickening or inflammation. Proximal sigmoid colon is redundant. Vascular/Lymphatic: Abdominal aorta is normal in caliber. Patent portal and splenic veins. Patent mesenteric vasculature. No acute vascular findings. No enlarged lymph nodes in the abdomen or pelvis. An 8 mm porta hepatis node is likely reactive. Small calcified perihepatic nodes are unchanged. Reproductive: Prostate is unremarkable. Incidental note of bilateral hydroceles as well as right epididymal head cyst, chronic. Other: Fat stranding and minimal free fluid adjacent to the pancreas. No other ascites. No focal fluid collection or abscess. Musculoskeletal: There are no acute or suspicious osseous abnormalities. IMPRESSION: 1. Acute edematous pancreatitis. No evidence of pancreatic  necrosis or acute peripancreatic fluid collection. 2. Hepatic steatosis. Electronically Signed   By: Narda Rutherford M.D.   On: 12/21/2020 21:54        Scheduled Meds: . enoxaparin (LOVENOX) injection  40 mg Subcutaneous Q24H  . folic acid  1 mg Oral Daily  . multivitamin with minerals  1 tablet Oral Daily  . thiamine  100 mg Oral Daily   Or  . thiamine  100 mg Intravenous Daily   Continuous Infusions: . sodium chloride 100 mL/hr at 12/21/20 2344  . diltiazem (CARDIZEM) infusion       LOS: 1 day    Time spent:    Azucena Fallen, DO [facility]  If 7PM-7AM, please contact night-coverage [email or web address]  12/22/2020, 7:36 AM

## 2020-12-23 DIAGNOSIS — F1010 Alcohol abuse, uncomplicated: Secondary | ICD-10-CM

## 2020-12-23 DIAGNOSIS — F1910 Other psychoactive substance abuse, uncomplicated: Secondary | ICD-10-CM

## 2020-12-23 DIAGNOSIS — N400 Enlarged prostate without lower urinary tract symptoms: Secondary | ICD-10-CM | POA: Diagnosis present

## 2020-12-23 LAB — COMPREHENSIVE METABOLIC PANEL
ALT: 38 U/L (ref 0–44)
AST: 49 U/L — ABNORMAL HIGH (ref 15–41)
Albumin: 3.5 g/dL (ref 3.5–5.0)
Alkaline Phosphatase: 65 U/L (ref 38–126)
Anion gap: 11 (ref 5–15)
BUN: 8 mg/dL (ref 6–20)
CO2: 24 mmol/L (ref 22–32)
Calcium: 8.9 mg/dL (ref 8.9–10.3)
Chloride: 105 mmol/L (ref 98–111)
Creatinine, Ser: 0.77 mg/dL (ref 0.61–1.24)
GFR, Estimated: >60 mL/min (ref >60)
Glucose, Bld: 89 mg/dL (ref 70–99)
Potassium: 3.4 mmol/L — ABNORMAL LOW (ref 3.5–5.1)
Sodium: 140 mmol/L (ref 135–145)
Total Bilirubin: 0.9 mg/dL (ref 0.3–1.2)
Total Protein: 7.3 g/dL (ref 6.5–8.1)

## 2020-12-23 LAB — CBC
HCT: 40.3 % (ref 39.0–52.0)
Hemoglobin: 14.3 g/dL (ref 13.0–17.0)
MCH: 32.5 pg (ref 26.0–34.0)
MCHC: 35.5 g/dL (ref 30.0–36.0)
MCV: 91.6 fL (ref 80.0–100.0)
Platelets: 185 10*3/uL (ref 150–400)
RBC: 4.4 MIL/uL (ref 4.22–5.81)
RDW: 12.7 % (ref 11.5–15.5)
WBC: 3.8 10*3/uL — ABNORMAL LOW (ref 4.0–10.5)
nRBC: 0 % (ref 0.0–0.2)

## 2020-12-23 LAB — LIPASE, BLOOD: Lipase: 53 U/L — ABNORMAL HIGH (ref 11–51)

## 2020-12-23 MED ORDER — DIPHENHYDRAMINE HCL 25 MG PO CAPS
50.0000 mg | ORAL_CAPSULE | Freq: Three times a day (TID) | ORAL | Status: DC | PRN
  Administered 2020-12-23 – 2020-12-26 (×6): 50 mg via ORAL
  Filled 2020-12-23 (×6): qty 2

## 2020-12-23 MED ORDER — HYDROMORPHONE HCL 1 MG/ML IJ SOLN
0.5000 mg | Freq: Three times a day (TID) | INTRAMUSCULAR | Status: DC | PRN
Start: 2020-12-23 — End: 2020-12-26
  Administered 2020-12-23 – 2020-12-25 (×6): 0.5 mg via INTRAVENOUS
  Filled 2020-12-23 (×6): qty 1

## 2020-12-23 MED ORDER — DILTIAZEM HCL ER COATED BEADS 120 MG PO CP24
120.0000 mg | ORAL_CAPSULE | Freq: Every day | ORAL | Status: DC
  Administered 2020-12-23 – 2020-12-26 (×4): 120 mg via ORAL
  Filled 2020-12-23 (×4): qty 1

## 2020-12-23 MED ORDER — POTASSIUM CHLORIDE 10 MEQ/100ML IV SOLN
10.0000 meq | INTRAVENOUS | Status: AC
Start: 2020-12-23 — End: 2020-12-23
  Administered 2020-12-23 (×5): 10 meq via INTRAVENOUS
  Filled 2020-12-23 (×5): qty 100

## 2020-12-23 MED ORDER — TAMSULOSIN HCL 0.4 MG PO CAPS
0.4000 mg | ORAL_CAPSULE | Freq: Every day | ORAL | Status: DC
  Administered 2020-12-23 – 2020-12-26 (×4): 0.4 mg via ORAL
  Filled 2020-12-23 (×4): qty 1

## 2020-12-23 MED ORDER — COVID-19 MRNA VACC (MODERNA) 100 MCG/0.5ML IM SUSP
0.5000 mL | Freq: Once | INTRAMUSCULAR | Status: AC
  Filled 2020-12-23: qty 0.5

## 2020-12-23 NOTE — Progress Notes (Signed)
PROGRESS NOTE    Bruce Bruce [name]  WUJ:[id number] DOB: 07/30/1971 DOA: 12/21/2020 PCP: Patient, No Pcp Per     Brief Narrative:  Bruce Route Delmont IIis a 50 y.o.BM PMHx Paroxysmal atrial fibrillation, Polysubstance abuse, EtOH pancreatitis, and Hepatitis C,   Presented to the emergency room withacute onset of left upper quadrant and epigastric abdominal pain that started this morning after consuming alcohol.  In the ED patient was noted to be in A. fib with RVR; CT abdomen pelvis scan showed acute edematous pancreatitis with no pancreatic necrosis or peripancreatic fluid collection.  Hospitalist called for admission.   Subjective: Afebrile overnight, A. fib rate controlled, A/O x4, states only time he is used cocaine was this week and at a [employer].  Also stated would like his Covid vaccination..  Also complains of signs and symptoms consistent with BPH.  States has difficulty starting stream of urine, frequent urination.  States PCP told him he had enlarged prostate.  Complains of increased itching with Dilaudid.   Assessment & Plan: Covid vaccination; unvaccinated   Active Problems:   Alcohol abuse   Polysubstance abuse (HCC)   Alcohol induced acute pancreatitis without necrosis or infection   Atrial fibrillation with RVR (HCC)   BPH (benign prostatic hyperplasia)  Acute EtOH pancreatitis/Hx EtOH abuse. -Recurrent, episode of pancreatitis.  Last episode  5 months ago -Lengthy education at bedside about complete alcohol cessation at this point given he states he only had two drinks with moderate to severe pancreatitis on imaging -Trend lipase Lab Results  Component Value Date   LIPASE 53 (H) 12/23/2020   LIPASE 140 (H) 12/22/2020   LIPASE 394 (H) 12/21/2020   LIPASE 51 10/01/2020   LIPASE 63 (H) 09/30/2020  -Trended down, almost normalized.  Continue to monitor  Substance abuse -2/14 UDS positive cocaine -Wean off Dilaudid, transition to p.o. narcotics, would be  hesitant to discharge on narcotics given cocaine positive UDS and concern for seeking behavior.  -CIWA protocol ongoing  Acute paroxysmal atrial fibrillation with a rapid ventricular response. - Cardiology following - Echo normal EF 55 to 60% 2020, repeat echo pending -Off diltiazem drip. -Cardizem CD 120 mg daily -Cardiology following -2/16 NSR  Essential hypertension -See A. fib  GERD with history of recurrent nausea and vomiting. -Zofran IV 4 mg PRN.  BPH -Obtain PSA -Flomax 0.4 mg daily    Hypokalemia -Potassium IV 50 mEq   Pain management -Counseled patient that given his alcohol use and cocaine use would not be discharged on narcotics.  Would need to establish care with pain clinic & sign contract.  Patient voiced understanding.     DVT prophylaxis: Lovenox Code Status: Full Family Communication:  Status is: Inpatient    Dispo: The patient is from: Home              Anticipated d/c is to: Home              Anticipated d/c date is: 2/18              Patient currently unstable      Consultants:    Procedures/Significant Events:    I have personally reviewed and interpreted all radiology studies and my findings are as above.  VENTILATOR SETTINGS:    Cultures   Antimicrobials:    Devices    LINES / TUBES:      Continuous Infusions: . sodium chloride 100 mL/hr at 12/23/20 0750     Objective: Vitals:  12/23/20 0328 12/23/20 0754 12/23/20 1208 12/23/20 1613  BP: 124/89 126/79 (!) 155/87 (!) 129/94  Pulse: (!) 59 (!) 58 61 66  Resp: 16 15 16 16   Temp: 98.1 F (36.7 C) 98.4 F (36.9 C) 98.1 F (36.7 C) 98.3 F (36.8 C)  TempSrc: Oral Oral Oral Oral  SpO2: 99% 100% 100% 100%  Weight: 73.3 kg     Height:        Intake/Output Summary (Last 24 hours) at 12/23/2020 1834 Last data filed at 12/23/2020 1800 Gross per 24 hour  Intake 1919.86 ml  Output 925 ml  Net 994.86 ml   Filed Weights   12/21/20 1710 12/23/20 0328   Weight: 74.8 kg 73.3 kg    Examination:  General: A/O x4, No acute respiratory distress Eyes: negative scleral hemorrhage, negative anisocoria, negative icterus ENT: Negative Runny nose, negative gingival bleeding, Neck:  Negative scars, masses, torticollis, lymphadenopathy, JVD Lungs: Clear to auscultation bilaterally without wheezes or crackles Cardiovascular: Regular rate and rhythm without murmur gallop or rub normal S1 and S2 Abdomen: Positive mild abdominal pain patient RUQ/LUQ, nondistended, positive soft, bowel sounds, no rebound, no ascites, no appreciable mass Extremities: No significant cyanosis, clubbing, or edema bilateral lower extremities Skin: Negative rashes, lesions, ulcers Psychiatric:  Negative depression, negative anxiety, negative fatigue, negative mania  Central nervous system:  Cranial nerves Bruce [name] through XII intact, tongue/uvula midline, all extremities muscle strength 5/5, sensation intact throughout, negative dysarthria, negative expressive aphasia, negative receptive aphasia.  .     Data Reviewed: Care during the described time interval was provided by me .  I have reviewed this patient's available data, including medical history, events of note, physical examination, and all test results as part of my evaluation.  CBC: Recent Labs  Lab 12/21/20 1731 12/22/20 0506 12/23/20 0505  WBC 11.6* 7.1 3.8*  HGB 16.9 14.2 14.3  HCT 46.8 40.4 40.3  MCV 89.3 90.4 91.6  PLT 253 197 185   Basic Metabolic Panel: Recent Labs  Lab 12/21/20 1731 12/21/20 2018 12/22/20 0506 12/23/20 0505  NA 138  --  140 140  K 3.9  --  3.4* 3.4*  CL 105  --  107 105  CO2 18*  --  25 24  GLUCOSE 76  --  108* 89  BUN 7  --  7 8  CREATININE 0.71  --  0.61 0.77  CALCIUM 9.5  --  8.2* 8.9  MG  --  1.9  --   --   PHOS  --   --  3.2  --    GFR: Estimated Creatinine Clearance: 102.1 mL/min (by C-G formula based on SCr of 0.77 mg/dL). Liver Function Tests: Recent Labs  Lab  12/21/20 1731 12/23/20 0505  AST 62* 49*  ALT 53* 38  ALKPHOS 99 65  BILITOT 1.0 0.9  PROT 9.1* 7.3  ALBUMIN 4.4 3.5   Recent Labs  Lab 12/21/20 1731 12/22/20 0506 12/23/20 0505  LIPASE 394* 140* 53*   No results for input(s): AMMONIA in the last 168 hours. Coagulation Profile: No results for input(s): INR, PROTIME in the last 168 hours. Cardiac Enzymes: No results for input(s): CKTOTAL, CKMB, CKMBINDEX, TROPONINI in the last 168 hours. BNP (last 3 results) No results for input(s): PROBNP in the last 8760 hours. HbA1C: Recent Labs    12/22/20 0506  HGBA1C 5.8*   CBG: No results for input(s): GLUCAP in the last 168 hours. Lipid Profile: Recent Labs    12/22/20 0506  CHOL 125  HDL 66  LDLCALC 48  TRIG 57  CHOLHDL 1.9   Thyroid Function Tests: Recent Labs    12/21/20 1731 12/21/20 2018  TSH 0.219*  --   FREET4  --  0.81   Anemia Panel: No results for input(s): VITAMINB12, FOLATE, FERRITIN, TIBC, IRON, RETICCTPCT in the last 72 hours. Sepsis Labs: No results for input(s): PROCALCITON, LATICACIDVEN in the last 168 hours.  Recent Results (from the past 240 hour(s))  SARS CORONAVIRUS 2 (TAT 6-24 HRS) Nasopharyngeal Nasopharyngeal Swab     Status: None   Collection Time: 12/21/20  8:10 PM   Specimen: Nasopharyngeal Swab  Result Value Ref Range Status   SARS Coronavirus 2 NEGATIVE NEGATIVE Final    Comment: (NOTE) SARS-CoV-2 target nucleic acids are NOT DETECTED.  The SARS-CoV-2 RNA is generally detectable in upper and lower respiratory specimens during the acute phase of infection. Negative results do not preclude SARS-CoV-2 infection, do not rule out co-infections with other pathogens, and should not be used as the sole basis for treatment or other patient management decisions. Negative results must be combined with clinical observations, patient history, and epidemiological information. The expected result is Negative.  Fact Sheet for  Patients: HairSlick.no  Fact Sheet for Healthcare Providers: quierodirigir.com  This test is not yet approved or cleared by the [place] FDA and  has been authorized for detection and/or diagnosis of SARS-CoV-2 by FDA under an Emergency Use Authorization (EUA). This EUA will remain  in effect (meaning this test can be used) for the duration of the COVID-19 declaration under Se ction 564(b)(1) of the Act, 21 U.S.C. section 360bbb-3(b)(1), unless the authorization is terminated or revoked sooner.  Performed at [facility], [address] [place], [place] [address]          Radiology Studies: DG Chest 1 View  Result Date: 12/21/2020 CLINICAL DATA:  Generalized abdominal pain and vomiting, atrial fibrillation, tachycardia EXAM: CHEST  1 VIEW COMPARISON:  08/28/2020 FINDINGS: The heart size and mediastinal contours are within normal limits. Both lungs are clear. The visualized skeletal structures are unremarkable. IMPRESSION: No active disease. Electronically Signed   By: Sharlet Salina M.D.   On: 12/21/2020 21:06   CT ABDOMEN PELVIS W CONTRAST  Result Date: 12/21/2020 CLINICAL DATA:  Acute abdominal pain. Vomiting. History of pancreatitis. EXAM: CT ABDOMEN AND PELVIS WITH CONTRAST TECHNIQUE: Multidetector CT imaging of the abdomen and pelvis was performed using the standard protocol following bolus administration of intravenous contrast. CONTRAST:  OMNIPAQUE IOHEXOL 300 MG/ML  SOLN COMPARISON:  Most recent CT 06/29/2020 FINDINGS: Lower chest: Punctate right lower lobe pulmonary nodule, series 4, image 5, stable dating back to at least August 2020 exam and likely benign. No pleural fluid or acute airspace disease. Hepatobiliary: Diffusely decreased hepatic density consistent with steatosis. No focal hepatic abnormality. Unremarkable gallbladder. No gallstone or cholecystitis. There is no biliary dilatation. Pancreas:  Mild diffuse peripancreatic fat stranding, most prominent involving the head and uncinate process. Homogeneous pancreatic enhancement without necrosis. No focal fluid collection. No ductal dilatation. No evidence of pancreatic mass. Spleen: Normal in size without focal abnormality. Adrenals/Urinary Tract: No adrenal nodule. No hydronephrosis or perinephric edema. Homogeneous renal enhancement with symmetric excretion on delayed phase imaging. Tiny low-density lesion in the upper right kidney is too small to characterize. Urinary bladder is only partially distended, no wall thickening for degree of distension. Stomach/Bowel: Fluid distended stomach. No gastric wall thickening. Mild enhancement of the duodenum in the region of pancreatic inflammation,  likely reactive. Decompressed small bowel. No small bowel obstruction. Normal appendix is coiled medial to the cecum, series 5, image 34. No colonic wall thickening or inflammation. Proximal sigmoid colon is redundant. Vascular/Lymphatic: Abdominal aorta is normal in caliber. Patent portal and splenic veins. Patent mesenteric vasculature. No acute vascular findings. No enlarged lymph nodes in the abdomen or pelvis. An 8 mm porta hepatis node is likely reactive. Small calcified perihepatic nodes are unchanged. Reproductive: Prostate is unremarkable. Incidental note of bilateral hydroceles as well as right epididymal head cyst, chronic. Other: Fat stranding and minimal free fluid adjacent to the pancreas. No other ascites. No focal fluid collection or abscess. Musculoskeletal: There are no acute or suspicious osseous abnormalities. IMPRESSION: 1. Acute edematous pancreatitis. No evidence of pancreatic necrosis or acute peripancreatic fluid collection. 2. Hepatic steatosis. Electronically Signed   By: Narda Rutherford M.D.   On: 12/21/2020 21:54   ECHOCARDIOGRAM COMPLETE  Result Date: 12/22/2020    ECHOCARDIOGRAM REPORT   Patient Name:   DARYON DEROUIN Bruce [name] Date of Exam:  12/22/2020 Medical Rec #:  [id number]         Height:       67.0 in Accession #:    [id number]        Weight:       164.9 lb Date of Birth:  [birth year]         BSA:          1.863 m Patient Age:    49 years          BP:           131/81 mmHg Patient Gender: M                 HR:           71 bpm. Exam Location:  [facility] Procedure: 2D Echo, Color Doppler and Cardiac Doppler Indications:     Atrial Fibrillation I48.91  History:         Patient has prior history of Echocardiogram examinations, most                  recent 06/14/2019. Arrythmias:Atrial Fibrillation. PAF.  Sonographer:     Cristela Blue RDCS (AE) Referring Phys:  [id number] Vernetta Honey MANSY Diagnosing Phys: Cristal Deer End MD IMPRESSIONS  1. Left ventricular ejection fraction, by estimation, is 55 to 60%. The left ventricle has normal function. Left ventricular endocardial border not optimally defined to evaluate regional wall motion. There is mild left ventricular hypertrophy. Left ventricular diastolic parameters are indeterminate.  2. Right ventricular systolic function is normal. The right ventricular size is normal. Tricuspid regurgitation signal is inadequate for assessing PA pressure.  3. The mitral valve is normal in structure. Trivial mitral valve regurgitation. No evidence of mitral stenosis.  4. The aortic valve has an indeterminant number of cusps. Aortic valve regurgitation is not visualized. No aortic stenosis is present. FINDINGS  Left Ventricle: Left ventricular ejection fraction, by estimation, is 55 to 60%. The left ventricle has normal function. Left ventricular endocardial border not optimally defined to evaluate regional wall motion. The left ventricular internal cavity size was normal in size. There is mild left ventricular hypertrophy. Left ventricular diastolic parameters are indeterminate. Right Ventricle: The right ventricular size is normal. No increase in right ventricular wall thickness. Right ventricular systolic function is normal. Tricuspid  regurgitation signal is inadequate for assessing PA pressure. Left Atrium: Left atrial size was normal in size. Right Atrium: Right atrial size was normal  in size. Pericardium: Trivial pericardial effusion is present. Mitral Valve: The mitral valve is normal in structure. Trivial mitral valve regurgitation. No evidence of mitral valve stenosis. Tricuspid Valve: The tricuspid valve is normal in structure. Tricuspid valve regurgitation is trivial. Aortic Valve: The aortic valve has an indeterminant number of cusps. Aortic valve regurgitation is not visualized. No aortic stenosis is present. Aortic valve mean gradient measures 2.0 mmHg. Aortic valve peak gradient measures 3.4 mmHg. Aortic valve area, by VTI measures 2.45 cm. Pulmonic Valve: The pulmonic valve was normal in structure. Pulmonic valve regurgitation is not visualized. No evidence of pulmonic stenosis. Aorta: The aortic root is normal in size and structure. Venous: The inferior vena cava was not well visualized. IAS/Shunts: The interatrial septum was not well visualized.  LEFT VENTRICLE PLAX 2D LVIDd:         4.71 cm  Diastology LVIDs:         3.14 cm  LV e' medial:    7.62 cm/s LV PW:         1.08 cm  LV E/e' medial:  11.1 LV IVS:        0.91 cm  LV e' lateral:   8.05 cm/s LVOT diam:     2.00 cm  LV E/e' lateral: 10.5 LV SV:         44 LV SV Index:   24 LVOT Area:     3.14 cm  RIGHT VENTRICLE RV Basal diam:  2.31 cm RV S prime:     13.30 cm/s LEFT ATRIUM           Index       RIGHT ATRIUM           Index LA diam:      2.90 cm 1.56 cm/m  RA Area:     14.50 cm LA Vol (A2C): 51.9 ml 27.86 ml/m RA Volume:   36.20 ml  19.43 ml/m LA Vol (A4C): 23.5 ml 12.61 ml/m  AORTIC VALVE                   PULMONIC VALVE AV Area (Vmax):    2.46 cm    PV Vmax:        0.72 m/s AV Area (Vmean):   2.38 cm    PV Peak grad:   2.1 mmHg AV Area (VTI):     2.45 cm    RVOT Peak grad: 3 mmHg AV Vmax:           91.80 cm/s AV Vmean:          66.200 cm/s AV VTI:            0.181  m AV Peak Grad:      3.4 mmHg AV Mean Grad:      2.0 mmHg LVOT Vmax:         71.80 cm/s LVOT Vmean:        50.200 cm/s LVOT VTI:          0.141 m LVOT/AV VTI ratio: 0.78  AORTA Ao Root diam: 3.30 cm MITRAL VALVE MV Area (PHT): 3.39 cm    SHUNTS MV Decel Time: 224 msec    Systemic VTI:  0.14 m MV E velocity: 84.80 cm/s  Systemic Diam: 2.00 cm MV A velocity: 87.80 cm/s MV E/A ratio:  0.97 Cristal Deer End MD Electronically signed by Yvonne Kendall MD Signature Date/Time: 12/22/2020/5:36:21 PM    Final         Scheduled Meds: . Melene Muller  ON 12/24/2020] COVID-19 mRNA vaccine (Moderna)  0.5 mL Intramuscular ONCE-1600  . diltiazem  120 mg Oral Daily  . enoxaparin (LOVENOX) injection  40 mg Subcutaneous Q24H  . folic acid  1 mg Oral Daily  . multivitamin with minerals  1 tablet Oral Daily  . tamsulosin  0.4 mg Oral Daily  . thiamine  100 mg Oral Daily   Or  . thiamine  100 mg Intravenous Daily   Continuous Infusions: . sodium chloride 100 mL/hr at 12/23/20 0750     LOS: 2 days    Time spent:40 min    Anastaisa Wooding, Roselind Messier, MD [facility]   If 7PM-7AM, please contact night-coverage 12/23/2020, 6:34 PM

## 2020-12-23 NOTE — Progress Notes (Addendum)
Progress Note  Patient Name: Bruce [name] Date of Encounter: 12/23/2020  [facility] Cardiologist: Mariah Milling  Subjective   Patient remains in SR with controlled rates. IV dilt>po dilt. Echo showed normal LVEF.  Lipase improving. UDS+ cocaine. The patient is feeling better but still has some abdominal pain.   Inpatient Medications    Scheduled Meds: . diltiazem  30 mg Oral Q6H  . enoxaparin (LOVENOX) injection  40 mg Subcutaneous Q24H  . folic acid  1 mg Oral Daily  . multivitamin with minerals  1 tablet Oral Daily  . thiamine  100 mg Oral Daily   Or  . thiamine  100 mg Intravenous Daily   Continuous Infusions: . sodium chloride 100 mL/hr at 12/23/20 0750   PRN Meds: acetaminophen, diphenhydrAMINE, HYDROcodone-acetaminophen, HYDROmorphone (DILAUDID) injection, LORazepam **OR** LORazepam, ondansetron (ZOFRAN) IV, promethazine   Vital Signs    Vitals:   12/22/20 1612 12/22/20 2044 12/23/20 0328 12/23/20 0754  BP: (!) 131/101 115/81 124/89 126/79  Pulse: 70 78 (!) 59 (!) 58  Resp: 16 15 16 15   Temp: 98.6 F (37 C) 98.4 F (36.9 C) 98.1 F (36.7 C) 98.4 F (36.9 C)  TempSrc: Oral Oral Oral Oral  SpO2: 99% 100% 99% 100%  Weight:   73.3 kg   Height:       No intake or output data in the 24 hours ending 12/23/20 0806 Last 3 Weights 12/23/2020 12/21/2020 09/28/2020  Weight (lbs) 161 lb 9.6 oz 164 lb 14.5 oz 165 lb  Weight (kg) 73.3 kg 74.8 kg 74.844 kg      Telemetry    SR, Hr 60-70s - Personally Reviewed  ECG    No new - Personally Reviewed  Physical Exam   GEN: No acute distress.   Neck: No JVD Cardiac: RRR, no murmurs, rubs, or gallops.  Respiratory: wheezing GI: Soft, nontender, non-distended  MS: No edema; No deformity. Neuro:  Nonfocal  Psych: Normal affect   Labs    High Sensitivity Troponin:   Recent Labs  Lab 12/21/20 2018  TROPONINIHS 18*      Chemistry Recent Labs  Lab 12/21/20 1731 12/22/20 0506 12/23/20 0505  NA 138 140  140  K 3.9 3.4* 3.4*  CL 105 107 105  CO2 18* 25 24  GLUCOSE 76 108* 89  BUN 7 7 8   CREATININE 0.71 0.61 0.77  CALCIUM 9.5 8.2* 8.9  PROT 9.1*  --  7.3  ALBUMIN 4.4  --  3.5  AST 62*  --  49*  ALT 53*  --  38  ALKPHOS 99  --  65  BILITOT 1.0  --  0.9  GFRNONAA >60 >60 >60  ANIONGAP 15 8 11      Hematology Recent Labs  Lab 12/21/20 1731 12/22/20 0506 12/23/20 0505  WBC 11.6* 7.1 3.8*  RBC 5.24 4.47 4.40  HGB 16.9 14.2 14.3  HCT 46.8 40.4 40.3  MCV 89.3 90.4 91.6  MCH 32.3 31.8 32.5  MCHC 36.1* 35.1 35.5  RDW 12.9 12.8 12.7  PLT 253 197 185    BNPNo results for input(s): BNP, PROBNP in the last 168 hours.   DDimer No results for input(s): DDIMER in the last 168 hours.   Radiology    DG Chest 1 View  Result Date: 12/21/2020 CLINICAL DATA:  Generalized abdominal pain and vomiting, atrial fibrillation, tachycardia EXAM: CHEST  1 VIEW COMPARISON:  08/28/2020 FINDINGS: The heart size and mediastinal contours are within normal limits. Both lungs are clear.  The visualized skeletal structures are unremarkable. IMPRESSION: No active disease. Electronically Signed   By: Sharlet Salina M.D.   On: 12/21/2020 21:06   CT ABDOMEN PELVIS W CONTRAST  Result Date: 12/21/2020 CLINICAL DATA:  Acute abdominal pain. Vomiting. History of pancreatitis. EXAM: CT ABDOMEN AND PELVIS WITH CONTRAST TECHNIQUE: Multidetector CT imaging of the abdomen and pelvis was performed using the standard protocol following bolus administration of intravenous contrast. CONTRAST:  OMNIPAQUE IOHEXOL 300 MG/ML  SOLN COMPARISON:  Most recent CT 06/29/2020 FINDINGS: Lower chest: Punctate right lower lobe pulmonary nodule, series 4, image 5, stable dating back to at least August 2020 exam and likely benign. No pleural fluid or acute airspace disease. Hepatobiliary: Diffusely decreased hepatic density consistent with steatosis. No focal hepatic abnormality. Unremarkable gallbladder. No gallstone or cholecystitis.  There is no biliary dilatation. Pancreas: Mild diffuse peripancreatic fat stranding, most prominent involving the head and uncinate process. Homogeneous pancreatic enhancement without necrosis. No focal fluid collection. No ductal dilatation. No evidence of pancreatic mass. Spleen: Normal in size without focal abnormality. Adrenals/Urinary Tract: No adrenal nodule. No hydronephrosis or perinephric edema. Homogeneous renal enhancement with symmetric excretion on delayed phase imaging. Tiny low-density lesion in the upper right kidney is too small to characterize. Urinary bladder is only partially distended, no wall thickening for degree of distension. Stomach/Bowel: Fluid distended stomach. No gastric wall thickening. Mild enhancement of the duodenum in the region of pancreatic inflammation, likely reactive. Decompressed small bowel. No small bowel obstruction. Normal appendix is coiled medial to the cecum, series 5, image 34. No colonic wall thickening or inflammation. Proximal sigmoid colon is redundant. Vascular/Lymphatic: Abdominal aorta is normal in caliber. Patent portal and splenic veins. Patent mesenteric vasculature. No acute vascular findings. No enlarged lymph nodes in the abdomen or pelvis. An 8 mm porta hepatis node is likely reactive. Small calcified perihepatic nodes are unchanged. Reproductive: Prostate is unremarkable. Incidental note of bilateral hydroceles as well as right epididymal head cyst, chronic. Other: Fat stranding and minimal free fluid adjacent to the pancreas. No other ascites. No focal fluid collection or abscess. Musculoskeletal: There are no acute or suspicious osseous abnormalities. IMPRESSION: 1. Acute edematous pancreatitis. No evidence of pancreatic necrosis or acute peripancreatic fluid collection. 2. Hepatic steatosis. Electronically Signed   By: Narda Rutherford M.D.   On: 12/21/2020 21:54   ECHOCARDIOGRAM COMPLETE  Result Date: 12/22/2020    ECHOCARDIOGRAM REPORT    Patient Name:   Bruce [name] Date of Exam: 12/22/2020 Medical Rec #:  [id number]         Height:       67.0 in Accession #:    [id number]        Weight:       164.9 lb Date of Birth:  Jun 03, 1971         BSA:          1.863 m Patient Age:    49 years          BP:           131/81 mmHg Patient Gender: M                 HR:           71 bpm. Exam Location:  [facility] Procedure: 2D Echo, Color Doppler and Cardiac Doppler Indications:     Atrial Fibrillation I48.91  History:         Patient has prior history of Echocardiogram examinations, most  recent 06/14/2019. Arrythmias:Atrial Fibrillation. PAF.  Sonographer:     Cristela Blue RDCS (AE) Referring Phys:  [id number] Vernetta Honey MANSY Diagnosing Phys: Cristal Deer End MD IMPRESSIONS  1. Left ventricular ejection fraction, by estimation, is 55 to 60%. The left ventricle has normal function. Left ventricular endocardial border not optimally defined to evaluate regional wall motion. There is mild left ventricular hypertrophy. Left ventricular diastolic parameters are indeterminate.  2. Right ventricular systolic function is normal. The right ventricular size is normal. Tricuspid regurgitation signal is inadequate for assessing PA pressure.  3. The mitral valve is normal in structure. Trivial mitral valve regurgitation. No evidence of mitral stenosis.  4. The aortic valve has an indeterminant number of cusps. Aortic valve regurgitation is not visualized. No aortic stenosis is present. FINDINGS  Left Ventricle: Left ventricular ejection fraction, by estimation, is 55 to 60%. The left ventricle has normal function. Left ventricular endocardial border not optimally defined to evaluate regional wall motion. The left ventricular internal cavity size was normal in size. There is mild left ventricular hypertrophy. Left ventricular diastolic parameters are indeterminate. Right Ventricle: The right ventricular size is normal. No increase in right ventricular wall thickness. Right  ventricular systolic function is normal. Tricuspid regurgitation signal is inadequate for assessing PA pressure. Left Atrium: Left atrial size was normal in size. Right Atrium: Right atrial size was normal in size. Pericardium: Trivial pericardial effusion is present. Mitral Valve: The mitral valve is normal in structure. Trivial mitral valve regurgitation. No evidence of mitral valve stenosis. Tricuspid Valve: The tricuspid valve is normal in structure. Tricuspid valve regurgitation is trivial. Aortic Valve: The aortic valve has an indeterminant number of cusps. Aortic valve regurgitation is not visualized. No aortic stenosis is present. Aortic valve mean gradient measures 2.0 mmHg. Aortic valve peak gradient measures 3.4 mmHg. Aortic valve area, by VTI measures 2.45 cm. Pulmonic Valve: The pulmonic valve was normal in structure. Pulmonic valve regurgitation is not visualized. No evidence of pulmonic stenosis. Aorta: The aortic root is normal in size and structure. Venous: The inferior vena cava was not well visualized. IAS/Shunts: The interatrial septum was not well visualized.  LEFT VENTRICLE PLAX 2D LVIDd:         4.71 cm  Diastology LVIDs:         3.14 cm  LV e' medial:    7.62 cm/s LV PW:         1.08 cm  LV E/e' medial:  11.1 LV IVS:        0.91 cm  LV e' lateral:   8.05 cm/s LVOT diam:     2.00 cm  LV E/e' lateral: 10.5 LV SV:         44 LV SV Index:   24 LVOT Area:     3.14 cm  RIGHT VENTRICLE RV Basal diam:  2.31 cm RV S prime:     13.30 cm/s LEFT ATRIUM           Index       RIGHT ATRIUM           Index LA diam:      2.90 cm 1.56 cm/m  RA Area:     14.50 cm LA Vol (A2C): 51.9 ml 27.86 ml/m RA Volume:   36.20 ml  19.43 ml/m LA Vol (A4C): 23.5 ml 12.61 ml/m  AORTIC VALVE                   PULMONIC VALVE AV Area (Vmax):  2.46 cm    PV Vmax:        0.72 m/s AV Area (Vmean):   2.38 cm    PV Peak grad:   2.1 mmHg AV Area (VTI):     2.45 cm    RVOT Peak grad: 3 mmHg AV Vmax:           91.80 cm/s AV  Vmean:          66.200 cm/s AV VTI:            0.181 m AV Peak Grad:      3.4 mmHg AV Mean Grad:      2.0 mmHg LVOT Vmax:         71.80 cm/s LVOT Vmean:        50.200 cm/s LVOT VTI:          0.141 m LVOT/AV VTI ratio: 0.78  AORTA Ao Root diam: 3.30 cm MITRAL VALVE MV Area (PHT): 3.39 cm    SHUNTS MV Decel Time: 224 msec    Systemic VTI:  0.14 m MV E velocity: 84.80 cm/s  Systemic Diam: 2.00 cm MV A velocity: 87.80 cm/s MV E/A ratio:  0.97 Cristal Deer End MD Electronically signed by Yvonne Kendall MD Signature Date/Time: 12/22/2020/5:36:21 PM    Final     Cardiac Studies    Echo 12/23/19 1. Left ventricular ejection fraction, by estimation, is 55 to 60%. The  left ventricle has normal function. Left ventricular endocardial border  not optimally defined to evaluate regional wall motion. There is mild left  ventricular hypertrophy. Left  ventricular diastolic parameters are indeterminate.  2. Right ventricular systolic function is normal. The right ventricular  size is normal. Tricuspid regurgitation signal is inadequate for assessing  PA pressure.  3. The mitral valve is normal in structure. Trivial mitral valve  regurgitation. No evidence of mitral stenosis.  4. The aortic valve has an indeterminant number of cusps. Aortic valve  regurgitation is not visualized. No aortic stenosis is present.   Echo 06/2019 1. The left ventricle has normal systolic function, with an ejection  fraction of 55-60%. The cavity size was normal. Left ventricular diastolic  parameters were normal.  2. The right ventricle has normal systolic function. The cavity was  normal. There is no increase in right ventricular wall thickness. Right  ventricular systolic pressure is normal with an estimated pressure of 20.6  mmHg.  3. No evidence of mitral valve stenosis.  4. The tricuspid valve is grossly normal.  5. The aortic valve is grossly normal. No stenosis of the aortic valve.  6. The aorta is normal  unless otherwise noted.   Heart monitor 10/2018 Event monitor  Normal sinus rhythm Avg HR of 85 bpm.  25 Supraventricular Tachycardia runs occurred, the run with the fastest interval lasting 5 beats with a max rate of 207 bpm, the longest lasting 14 beats with an avg rate of 156 bpm.   Isolated SVEs were occasional (1.1%, 15308), SVE Couplets were rare (<1.0%, 137), and SVE Triplets were rare (<1.0%, 42). Isolated VEs were rare (<1.0%), VE Couplets were rare (<1.0%), and no VE Triplets were present. Ventricular Bigeminy was present.  Patient triggered events were not associated with significant arrhythmia  Signed, Dossie Arbour, MD, Ph.D [facility]  Myoview lexiscan 10/2018 Narrative & Impression   T wave inversion was noted during stress in the III leads.  There was no ST segment deviation noted during stress.  The study is normal.  This is  a low risk study.  Nuclear stress EF: 50%.     Patient Profile     50 y.o. male with pmh of paroxysmal atrial fibrillation not on OAC, polysubstance abuse (IV drug use, cocaine use), hepatitis C from IV drug use, alcoholism, prior incarceration, and noncompliance who is being seen today for the evaluation of atrial fibrillation with RVR.   Assessment & Plan    A. fib with RVR History of PAF - Patient presented with abdominal pain and palpitations found to have acute pancreatitis and A. fib RVR with rates up to 200.  He was started on IV dilt drip. Patient converted to SR in the ER. - patient first diagnosed back in 2019. Treatment has been difficult due to a history of noncompliance and substance use. He was not started on OAC due to CHADSVASC of 0. - IV dilit transitioned to dilt 30mg  Q6H. Consolidate to 120mg  daily - No BB with hx of cocaine use.  - keep potassium > 4 and mag> 2 - TSH 0.219, FT4 wnl - Echo showed LVEF normal LV size with mild LVH and LVEF 55-60% and no significant valvular abnormality seen. - CHADSVASC  at least 1 with HTN. No anticoagulation with CHADSVASC and history of noncompliance  Acute pancreatitis due to alcohol use History of alcohol abuse - CT abd/pelvis with acute pancreatitis. - last reported drink 2/13 - lipase improving - CIWA per IM  Hypertension - Bps good - Diltiazem 30mg Q6H>>consolidate as above  History of substance abuse - UDS positive for cocaine   For questions or updates, please contact [facility] Please consult [email or web address] for contact info under        Signed, Suvan Stcyr David Stall, PA-C  12/23/2020, 8:06 AM

## 2020-12-23 NOTE — TOC Initial Note (Signed)
Transition of Care [facility]) - Initial/Assessment Note    Patient Details  Name: Bruce [name] MRN: [id number] Date of Birth: 13-Jun-1971  Transition of Care [facility]) CM/SW Contact:    Hetty Ely, RN Phone Number: 12/23/2020, 10:40 AM  Clinical Narrative:  Spoke with patient who is alert and oriented x4, complains of abdominal pain. No PCP, on HTN meds however non-compliant. Lives with girlfriend who provides meals and pick up meds at the [facility] [employer] in [place]. Patient given pamphlet for Medication Management and the [facility] to schedule appointment for PCP. Patient also given BP Cuff to monitor BP advised to keep a log also given parameters for normal readings. Patient voices understanding.                 Expected Discharge Plan: Home/Self Care Barriers to Discharge: Continued Medical Work up   Patient Goals and CMS Choice Patient states their goals for this hospitalization and ongoing recovery are:: To return home.   Choice offered to / list presented to : NA  Expected Discharge Plan and Services Expected Discharge Plan: Home/Self Care In-house Referral: Clinical Social Work Discharge Planning Services: Medication Assistance Post Acute Care Choice: NA Living arrangements for the past 2 months: Single Family Home                 DME Arranged: N/A DME Agency: NA       HH Arranged: NA HH Agency: NA        Prior Living Arrangements/Services Living arrangements for the past 2 months: Single Family Home Lives with:: Significant Other Patient language and need for interpreter reviewed:: Yes Do you feel safe going back to the place where you live?: Yes      Need for Family Participation in Patient Care: No (Comment) Care giver support system in place?: Yes (comment)   Criminal Activity/Legal Involvement Pertinent to Current Situation/Hospitalization: No - Comment as needed  Activities of Daily Living Home Assistive Devices/Equipment: None ADL  Screening (condition at time of admission) Patient's cognitive ability adequate to safely complete daily activities?: Yes Is the patient deaf or have difficulty hearing?: No Does the patient have difficulty seeing, even when wearing glasses/contacts?: No Does the patient have difficulty concentrating, remembering, or making decisions?: No Patient able to express need for assistance with ADLs?: Yes Does the patient have difficulty dressing or bathing?: No Independently performs ADLs?: Yes (appropriate for developmental age) Does the patient have difficulty walking or climbing stairs?: Yes Weakness of Legs: Both Weakness of Arms/Hands: None  Permission Sought/Granted   Permission granted to share information with : No              Emotional Assessment Appearance:: Appears stated age Attitude/Demeanor/Rapport: Engaged Affect (typically observed): Accepting Orientation: : Oriented to Self,Oriented to Place,Oriented to  Time,Oriented to Situation Alcohol / Substance Use: Not Applicable Psych Involvement: No (comment)  Admission diagnosis:  Atrial fibrillation with RVR (HCC) [I48.91] Alcohol-induced acute pancreatitis with infected necrosis [K85.22] Atrial fibrillation, unspecified type (HCC) [I48.91] Alcohol-induced acute pancreatitis, unspecified complication status [K85.20] Patient Active Problem List   Diagnosis Date Noted  . Atrial fibrillation with RVR (HCC) 12/21/2020  . Alcohol-induced pancreatitis 09/28/2020  . Recurrent pancreatitis 06/29/2020  . Alcohol induced acute pancreatitis without necrosis or infection 05/03/2020  . Polysubstance abuse (HCC)   . Hypokalemia   . Alcohol abuse   . Acute pancreatitis 01/12/2020  . Thrombocytopenia (HCC) 06/25/2019  . Elevated troponin   . Pancreatitis 06/23/2019  . AF (  paroxysmal atrial fibrillation) (HCC) 09/13/2018  . Atrial fibrillation (HCC) 03/19/2018  . Abnormality of gait 02/12/2013  . Mandibular fracture (HCC)  12/25/2012  . Flu vaccine need 12/25/2012  . Scrotal mass 05/22/2012  . Dental caries 05/22/2012  . Hepatitis C infection 05/16/2012  . Myalgia 05/06/2012  . Arthralgia 05/06/2012  . Tick borne fever, likely 05/05/2012  . Tinea cruris, likely 05/05/2012  . Spermatocele of epididymis 05/01/2012  . Bilateral varicoceles 05/01/2012  . Bilateral hydrocele 05/01/2012   PCP:  Patient, No Pcp Per Pharmacy:   [facility] [place], [place] - [address] [place] [address] Phone: [phone] Fax: [phone]  [facility] [address] ([address]), [place] - [address]) [place] [address] Phone: [phone] Fax: [phone]  [facility] - [place], [place] - [address] [place] [address] Phone: [phone] Fax: [phone]     Social Determinants of Health (SDOH) Interventions    Readmission Risk Interventions No flowsheet data found.

## 2020-12-24 LAB — COMPREHENSIVE METABOLIC PANEL
ALT: 43 U/L (ref 0–44)
AST: 62 U/L — ABNORMAL HIGH (ref 15–41)
Albumin: 3.4 g/dL — ABNORMAL LOW (ref 3.5–5.0)
Alkaline Phosphatase: 59 U/L (ref 38–126)
Anion gap: 7 (ref 5–15)
BUN: 5 mg/dL — ABNORMAL LOW (ref 6–20)
CO2: 29 mmol/L (ref 22–32)
Calcium: 9 mg/dL (ref 8.9–10.3)
Chloride: 101 mmol/L (ref 98–111)
Creatinine, Ser: 0.75 mg/dL (ref 0.61–1.24)
GFR, Estimated: >60 mL/min (ref >60)
Glucose, Bld: 122 mg/dL — ABNORMAL HIGH (ref 70–99)
Potassium: 3.4 mmol/L — ABNORMAL LOW (ref 3.5–5.1)
Sodium: 137 mmol/L (ref 135–145)
Total Bilirubin: 0.8 mg/dL (ref 0.3–1.2)
Total Protein: 7 g/dL (ref 6.5–8.1)

## 2020-12-24 LAB — CBC WITH DIFFERENTIAL/PLATELET
Abs Immature Granulocytes: 0.01 10*3/uL (ref 0.00–0.07)
Basophils Absolute: 0 10*3/uL (ref 0.0–0.1)
Basophils Relative: 0 %
Eosinophils Absolute: 0.1 10*3/uL (ref 0.0–0.5)
Eosinophils Relative: 2 %
HCT: 38.6 % — ABNORMAL LOW (ref 39.0–52.0)
Hemoglobin: 13.7 g/dL (ref 13.0–17.0)
Immature Granulocytes: 0 %
Lymphocytes Relative: 51 %
Lymphs Abs: 1.3 10*3/uL (ref 0.7–4.0)
MCH: 32.5 pg (ref 26.0–34.0)
MCHC: 35.5 g/dL (ref 30.0–36.0)
MCV: 91.5 fL (ref 80.0–100.0)
Monocytes Absolute: 0.4 10*3/uL (ref 0.1–1.0)
Monocytes Relative: 13 %
Neutro Abs: 0.9 10*3/uL — ABNORMAL LOW (ref 1.7–7.7)
Neutrophils Relative %: 34 %
Platelets: 175 10*3/uL (ref 150–400)
RBC: 4.22 MIL/uL (ref 4.22–5.81)
RDW: 12.3 % (ref 11.5–15.5)
Smear Review: NORMAL
WBC: 2.6 10*3/uL — ABNORMAL LOW (ref 4.0–10.5)
nRBC: 0 % (ref 0.0–0.2)

## 2020-12-24 LAB — PHOSPHORUS: Phosphorus: 3.2 mg/dL (ref 2.5–4.6)

## 2020-12-24 LAB — PSA: Prostatic Specific Antigen: 1.62 ng/mL (ref 0.00–4.00)

## 2020-12-24 LAB — MAGNESIUM: Magnesium: 1.7 mg/dL (ref 1.7–2.4)

## 2020-12-24 LAB — LIPASE, BLOOD: Lipase: 33 U/L (ref 11–51)

## 2020-12-24 MED ORDER — MAGNESIUM SULFATE 2 GM/50ML IV SOLN
2.0000 g | Freq: Once | INTRAVENOUS | Status: AC
  Administered 2020-12-24: 2 g via INTRAVENOUS
  Filled 2020-12-24: qty 50

## 2020-12-24 MED ORDER — POTASSIUM CHLORIDE 10 MEQ/100ML IV SOLN
10.0000 meq | INTRAVENOUS | Status: AC
Start: 2020-12-24 — End: 2020-12-24
  Administered 2020-12-24 (×3): 10 meq via INTRAVENOUS
  Filled 2020-12-24 (×7): qty 100

## 2020-12-24 MED ORDER — HYDROCODONE-ACETAMINOPHEN 5-325 MG PO TABS
1.0000 | ORAL_TABLET | ORAL | Status: DC | PRN
  Administered 2020-12-24: 2 via ORAL
  Administered 2020-12-24: 1 via ORAL
  Administered 2020-12-25 – 2020-12-26 (×4): 2 via ORAL
  Filled 2020-12-24 (×6): qty 2

## 2020-12-24 NOTE — TOC Progression Note (Signed)
Transition of Care [facility]) - Progression Note    Patient Details  Name: Bruce [name] MRN: [id number] Date of Birth: 06/10/1971  [facility]) CM/SW Contact  Hetty Ely, RN Phone Number: 12/24/2020, 12:24 PM  Clinical Narrative:  Patient given Substance Abuse services list to assess services. Patient agrees to initiate services so he can get his driving license. Receptive to receiving list.     Expected Discharge Plan: Home/Self Care Barriers to Discharge: Continued Medical Work up  Expected Discharge Plan and Services Expected Discharge Plan: Home/Self Care In-house Referral: Clinical Social Work Discharge Planning Services: Medication Assistance Post Acute Care Choice: NA Living arrangements for the past 2 months: Single Family Home                 DME Arranged: N/A DME Agency: NA       HH Arranged: NA HH Agency: NA         Social Determinants of Health (SDOH) Interventions    Readmission Risk Interventions No flowsheet data found.

## 2020-12-24 NOTE — Progress Notes (Signed)
Report given to [facility] 1A. Patient transferring to room 144.

## 2020-12-24 NOTE — Progress Notes (Signed)
PROGRESS NOTE    Bruce [name]  UYQ:[id number] DOB: February 04, 1971 DOA: 12/21/2020 PCP: Patient, No Pcp Per     Brief Narrative:  Bruce Route Montas IIis a 50 y.o.BM PMHx Paroxysmal atrial fibrillation, Polysubstance abuse, EtOH pancreatitis, and Hepatitis C,   Presented to the emergency room withacute onset of left upper quadrant and epigastric abdominal pain that started this morning after consuming alcohol.  In the ED patient was noted to be in A. fib with RVR; CT abdomen pelvis scan showed acute edematous pancreatitis with no pancreatic necrosis or peripancreatic fluid collection.  Hospitalist called for admission.   Subjective: 2/17 A/O x4.  Patient with increased abdominal pain secondary to eating ice cream.     Assessment & Plan: Covid vaccination; unvaccinated   Active Problems:   Alcohol abuse   Polysubstance abuse (HCC)   Alcohol induced acute pancreatitis without necrosis or infection   Atrial fibrillation with RVR (HCC)   BPH (benign prostatic hyperplasia)  Acute EtOH pancreatitis/Hx EtOH abuse. -Recurrent, episode of pancreatitis.  Last episode  5 months ago -Lengthy education at bedside about complete alcohol cessation at this point given he states he only had two drinks with moderate to severe pancreatitis on imaging -Trend lipase Lab Results  Component Value Date   LIPASE 33 12/24/2020   LIPASE 53 (H) 12/23/2020   LIPASE 140 (H) 12/22/2020   LIPASE 394 (H) 12/21/2020   LIPASE 51 10/01/2020  -2/17 lipase normalized -Patient with complaints of increased abdominal pain with consumption of ice cream.  Counseled on continuing to consume liquids but slowly.  We will hold on advancing diet.  Substance abuse -2/14 UDS positive cocaine -Wean off Dilaudid, transition to p.o. narcotics, would be hesitant to discharge on narcotics given cocaine positive UDS and concern for seeking behavior.  -CIWA protocol ongoing  Acute paroxysmal atrial fibrillation with a rapid  ventricular response. - Cardiology following - Echo normal EF 55 to 60% 2020, repeat echo pending -Off diltiazem drip. -Cardizem CD 120 mg daily -Cardiology following -2/16 NSR  Essential hypertension -See A. fib  GERD with history of recurrent nausea and vomiting. -Zofran IV 4 mg PRN.  BPH -Obtain PSA -Flomax 0.4 mg daily  Hypokalemia -Potassium goal> 4 -2/17 potassium IV 60 mEq  Hypomagnesmia -Magnesium goal> 2 -2/17 magnesium IV 2 g   Pain management -Counseled patient that given his alcohol use and cocaine use would not be discharged on narcotics.  Would need to establish care with pain clinic & sign contract.  Patient voiced understanding.     DVT prophylaxis: Lovenox Code Status: Full Family Communication:  Status is: Inpatient    Dispo: The patient is from: Home              Anticipated d/c is to: Home              Anticipated d/c date is: 2/19              Patient currently unstable      Consultants:    Procedures/Significant Events:    I have personally reviewed and interpreted all radiology studies and my findings are as above.  VENTILATOR SETTINGS:    Cultures   Antimicrobials:    Devices    LINES / TUBES:      Continuous Infusions: . sodium chloride 100 mL/hr at 12/24/20 1646     Objective: Vitals:   12/24/20 0913 12/24/20 1209 12/24/20 1540 12/24/20 1757  BP: 131/82 122/73 (!) 127/91 (!) 141/87  Pulse:  61 (!) 59 62  Resp:  18 18 15   Temp:  98.4 F (36.9 C) 98.4 F (36.9 C) 98.4 F (36.9 C)  TempSrc:  Oral Oral Oral  SpO2:  99% 100% 100%  Weight:      Height:        Intake/Output Summary (Last 24 hours) at 12/24/2020 1809 Last data filed at 12/24/2020 1646 Gross per 24 hour  Intake 2731.41 ml  Output --  Net 2731.41 ml   Filed Weights   12/21/20 1710 12/23/20 0328  Weight: 74.8 kg 73.3 kg    Examination:  General: A/O x4, No acute respiratory distress Eyes: negative scleral hemorrhage,  negative anisocoria, negative icterus ENT: Negative Runny nose, negative gingival bleeding, Neck:  Negative scars, masses, torticollis, lymphadenopathy, JVD Lungs: Clear to auscultation bilaterally without wheezes or crackles Cardiovascular: Regular rate and rhythm without murmur gallop or rub normal S1 and S2 Abdomen: Positive mild abdominal pain patient RUQ/LUQ, nondistended, positive soft, bowel sounds, no rebound, no ascites, no appreciable mass Extremities: No significant cyanosis, clubbing, or edema bilateral lower extremities Skin: Negative rashes, lesions, ulcers Psychiatric:  Negative depression, negative anxiety, negative fatigue, negative mania  Central nervous system:  Cranial nerves [name] through XII intact, tongue/uvula midline, all extremities muscle strength 5/5, sensation intact throughout, negative dysarthria, negative expressive aphasia, negative receptive aphasia.  .     Data Reviewed: Care during the described time interval was provided by me .  I have reviewed this patient's available data, including medical history, events of note, physical examination, and all test results as part of my evaluation.  CBC: Recent Labs  Lab 12/21/20 1731 12/22/20 0506 12/23/20 0505 12/24/20 0510  WBC 11.6* 7.1 3.8* 2.6*  NEUTROABS  --   --   --  0.9*  HGB 16.9 14.2 14.3 13.7  HCT 46.8 40.4 40.3 38.6*  MCV 89.3 90.4 91.6 91.5  PLT 253 197 185 175   Basic Metabolic Panel: Recent Labs  Lab 12/21/20 1731 12/21/20 2018 12/22/20 0506 12/23/20 0505 12/24/20 0510  NA 138  --  140 140 137  K 3.9  --  3.4* 3.4* 3.4*  CL 105  --  107 105 101  CO2 18*  --  25 24 29   GLUCOSE 76  --  108* 89 122*  BUN 7  --  7 8 5*  CREATININE 0.71  --  0.61 0.77 0.75  CALCIUM 9.5  --  8.2* 8.9 9.0  MG  --  1.9  --   --  1.7  PHOS  --   --  3.2  --  3.2   GFR: Estimated Creatinine Clearance: 102.1 mL/min (by C-G formula based on SCr of 0.75 mg/dL). Liver Function Tests: Recent Labs  Lab  12/21/20 1731 12/23/20 0505 12/24/20 0510  AST 62* 49* 62*  ALT 53* 38 43  ALKPHOS 99 65 59  BILITOT 1.0 0.9 0.8  PROT 9.1* 7.3 7.0  ALBUMIN 4.4 3.5 3.4*   Recent Labs  Lab 12/21/20 1731 12/22/20 0506 12/23/20 0505 12/24/20 0510  LIPASE 394* 140* 53* 33   No results for input(s): AMMONIA in the last 168 hours. Coagulation Profile: No results for input(s): INR, PROTIME in the last 168 hours. Cardiac Enzymes: No results for input(s): CKTOTAL, CKMB, CKMBINDEX, TROPONINI in the last 168 hours. BNP (last 3 results) No results for input(s): PROBNP in the last 8760 hours. HbA1C: Recent Labs    12/22/20 0506  HGBA1C 5.8*   CBG: No results for input(s): GLUCAP  in the last 168 hours. Lipid Profile: Recent Labs    12/22/20 0506  CHOL 125  HDL 66  LDLCALC 48  TRIG 57  CHOLHDL 1.9   Thyroid Function Tests: Recent Labs    12/21/20 2018  FREET4 0.81   Anemia Panel: No results for input(s): VITAMINB12, FOLATE, FERRITIN, TIBC, IRON, RETICCTPCT in the last 72 hours. Sepsis Labs: No results for input(s): PROCALCITON, LATICACIDVEN in the last 168 hours.  Recent Results (from the past 240 hour(s))  SARS CORONAVIRUS 2 (TAT 6-24 HRS) Nasopharyngeal Nasopharyngeal Swab     Status: None   Collection Time: 12/21/20  8:10 PM   Specimen: Nasopharyngeal Swab  Result Value Ref Range Status   SARS Coronavirus 2 NEGATIVE NEGATIVE Final    Comment: (NOTE) SARS-CoV-2 target nucleic acids are NOT DETECTED.  The SARS-CoV-2 RNA is generally detectable in upper and lower respiratory specimens during the acute phase of infection. Negative results do not preclude SARS-CoV-2 infection, do not rule out co-infections with other pathogens, and should not be used as the sole basis for treatment or other patient management decisions. Negative results must be combined with clinical observations, patient history, and epidemiological information. The expected result is Negative.  Fact Sheet  for Patients: HairSlick.no  Fact Sheet for Healthcare Providers: quierodirigir.com  This test is not yet approved or cleared by the [place] FDA and  has been authorized for detection and/or diagnosis of SARS-CoV-2 by FDA under an Emergency Use Authorization (EUA). This EUA will remain  in effect (meaning this test can be used) for the duration of the COVID-19 declaration under Se ction 564(b)(1) of the Act, 21 U.S.C. section 360bbb-3(b)(1), unless the authorization is terminated or revoked sooner.  Performed at [facility], [address], [place], [place] [address]          Radiology Studies: No results found.      Scheduled Meds: . COVID-19 mRNA vaccine (Moderna)  0.5 mL Intramuscular ONCE-1600  . diltiazem  120 mg Oral Daily  . enoxaparin (LOVENOX) injection  40 mg Subcutaneous Q24H  . folic acid  1 mg Oral Daily  . multivitamin with minerals  1 tablet Oral Daily  . tamsulosin  0.4 mg Oral Daily  . thiamine  100 mg Oral Daily   Or  . thiamine  100 mg Intravenous Daily   Continuous Infusions: . sodium chloride 100 mL/hr at 12/24/20 1646     LOS: 3 days    Time spent:40 min    Airabella Barley, Roselind Messier, MD [facility]   If 7PM-7AM, please contact night-coverage 12/24/2020, 6:09 PM

## 2020-12-24 NOTE — Plan of Care (Signed)

## 2020-12-25 LAB — CBC WITH DIFFERENTIAL/PLATELET
Abs Immature Granulocytes: 0.02 10*3/uL (ref 0.00–0.07)
Basophils Absolute: 0 10*3/uL (ref 0.0–0.1)
Basophils Relative: 1 %
Eosinophils Absolute: 0.1 10*3/uL (ref 0.0–0.5)
Eosinophils Relative: 3 %
HCT: 40.6 % (ref 39.0–52.0)
Hemoglobin: 14.3 g/dL (ref 13.0–17.0)
Immature Granulocytes: 1 %
Lymphocytes Relative: 57 %
Lymphs Abs: 1.5 10*3/uL (ref 0.7–4.0)
MCH: 32.1 pg (ref 26.0–34.0)
MCHC: 35.2 g/dL (ref 30.0–36.0)
MCV: 91 fL (ref 80.0–100.0)
Monocytes Absolute: 0.4 10*3/uL (ref 0.1–1.0)
Monocytes Relative: 15 %
Neutro Abs: 0.6 10*3/uL — ABNORMAL LOW (ref 1.7–7.7)
Neutrophils Relative %: 23 %
Platelets: 193 10*3/uL (ref 150–400)
RBC: 4.46 MIL/uL (ref 4.22–5.81)
RDW: 12 % (ref 11.5–15.5)
Smear Review: NORMAL
WBC: 2.7 10*3/uL — ABNORMAL LOW (ref 4.0–10.5)
nRBC: 0 % (ref 0.0–0.2)

## 2020-12-25 LAB — COMPREHENSIVE METABOLIC PANEL
ALT: 55 U/L — ABNORMAL HIGH (ref 0–44)
AST: 77 U/L — ABNORMAL HIGH (ref 15–41)
Albumin: 3.5 g/dL (ref 3.5–5.0)
Alkaline Phosphatase: 63 U/L (ref 38–126)
Anion gap: 8 (ref 5–15)
BUN: <5 mg/dL — ABNORMAL LOW (ref 6–20)
CO2: 27 mmol/L (ref 22–32)
Calcium: 9.2 mg/dL (ref 8.9–10.3)
Chloride: 101 mmol/L (ref 98–111)
Creatinine, Ser: 0.68 mg/dL (ref 0.61–1.24)
GFR, Estimated: >60 mL/min (ref >60)
Glucose, Bld: 146 mg/dL — ABNORMAL HIGH (ref 70–99)
Potassium: 3.4 mmol/L — ABNORMAL LOW (ref 3.5–5.1)
Sodium: 136 mmol/L (ref 135–145)
Total Bilirubin: 0.7 mg/dL (ref 0.3–1.2)
Total Protein: 7.3 g/dL (ref 6.5–8.1)

## 2020-12-25 LAB — PATHOLOGIST SMEAR REVIEW

## 2020-12-25 LAB — PHOSPHORUS: Phosphorus: 2.9 mg/dL (ref 2.5–4.6)

## 2020-12-25 LAB — MAGNESIUM: Magnesium: 2 mg/dL (ref 1.7–2.4)

## 2020-12-25 LAB — LIPASE, BLOOD: Lipase: 26 U/L (ref 11–51)

## 2020-12-25 MED ORDER — POTASSIUM CHLORIDE CRYS ER 20 MEQ PO TBCR
20.0000 meq | EXTENDED_RELEASE_TABLET | Freq: Once | ORAL | Status: AC
  Administered 2020-12-25: 20 meq via ORAL
  Filled 2020-12-25: qty 1

## 2020-12-25 MED ORDER — POTASSIUM CHLORIDE 10 MEQ/100ML IV SOLN
10.0000 meq | INTRAVENOUS | Status: AC
  Administered 2020-12-25 (×3): 10 meq via INTRAVENOUS
  Filled 2020-12-25 (×2): qty 100

## 2020-12-25 MED ORDER — POTASSIUM CHLORIDE 10 MEQ/100ML IV SOLN
10.0000 meq | INTRAVENOUS | Status: AC
  Administered 2020-12-25 (×3): 10 meq via INTRAVENOUS
  Filled 2020-12-25: qty 100

## 2020-12-25 NOTE — Progress Notes (Signed)
PROGRESS NOTE    Bruce [name]  VPX:[id number] DOB: 11/02/1971 DOA: 12/21/2020 PCP: Patient, No Pcp Per     Brief Narrative:  Bruce Route Paragas IIis a 50 y.o.BM PMHx Paroxysmal atrial fibrillation, Polysubstance abuse, EtOH pancreatitis, and Hepatitis C,   Presented to the emergency room withacute onset of left upper quadrant and epigastric abdominal pain that started this morning after consuming alcohol.  In the ED patient was noted to be in A. fib with RVR; CT abdomen pelvis scan showed acute edematous pancreatitis with no pancreatic necrosis or peripancreatic fluid collection.  Hospitalist called for admission.   Subjective: 2/18 afebrile overnight A/O x4, patient stated had a couple bowls of grits without nausea or vomiting.  Minimal abdominal pain postprandial.   Assessment & Plan: Covid vaccination; unvaccinated   Active Problems:   Alcohol abuse   Polysubstance abuse (HCC)   Alcohol induced acute pancreatitis without necrosis or infection   Atrial fibrillation with RVR (HCC)   BPH (benign prostatic hyperplasia)  Acute EtOH pancreatitis/Hx EtOH abuse. -Recurrent, episode of pancreatitis.  Last episode  5 months ago -Lengthy education at bedside about complete alcohol cessation at this point given he states he only had two drinks with moderate to severe pancreatitis on imaging -Trend lipase Lab Results  Component Value Date   LIPASE 26 12/25/2020   LIPASE 33 12/24/2020   LIPASE 53 (H) 12/23/2020   LIPASE 140 (H) 12/22/2020   LIPASE 394 (H) 12/21/2020  -2/17 lipase normalized -Patient with complaints of increased abdominal pain with consumption of ice cream.  Counseled on continuing to consume liquids but slowly.  We will hold on advancing diet. -2/18 lipase normalized, patient tolerated grits this a.m. will advance diet to regular (bland).  Counseled patient on bland diet.  Substance abuse -2/14 UDS positive cocaine -Wean off Dilaudid, transition to p.o.  narcotics, would be hesitant to discharge on narcotics given cocaine positive UDS and concern for seeking behavior.  -CIWA protocol ongoing  Acute paroxysmal atrial fibrillation with a rapid ventricular response. - Cardiology following - Echo normal EF 55 to 60% 2020, repeat echo pending -Off diltiazem drip. -Cardizem CD 120 mg daily -Cardiology following -2/16 NSR  Essential hypertension -See A. fib  GERD with history of recurrent nausea and vomiting. -Zofran IV 4 mg PRN.  BPH -Obtain PSA -Flomax 0.4 mg daily  Hypokalemia -Potassium goal> 4 -2/18 potassium IV 60 meq + potassium p.o. 20 mEq  Hypomagnesmia -Magnesium goal> 2   Pain management -Counseled patient that given his alcohol use and cocaine use would not be discharged on narcotics.  Would need to establish care with pain clinic & sign contract.  Patient voiced understanding.  Neutropenia - levamisole in cocaine may be the cause of patient's new onset neutropenia.  We will also obtain HIV screen -Smear analyzed by Dr Ronald Lobo [facility].  Stated ANC today is 600 per microL. Platelets and RBCs are OK. No abnormal WBCs are seen. Further investigation as to cause may be needed if neutropenia doesn't resolve. -Refer to PCP and hematology/oncology at discharge for monitoring and possible further work-up      DVT prophylaxis: Lovenox Code Status: Full Family Communication:  Status is: Inpatient    Dispo: The patient is from: Home              Anticipated d/c is to: Home              Anticipated d/c date is: 2/19  Patient currently unstable      Consultants:    Procedures/Significant Events:    I have personally reviewed and interpreted all radiology studies and my findings are as above.  VENTILATOR SETTINGS:    Cultures   Antimicrobials:    Devices    LINES / TUBES:      Continuous Infusions: . sodium chloride 100 mL/hr at 12/25/20 0935     Objective: Vitals:    12/24/20 1942 12/24/20 2308 12/25/20 0426 12/25/20 0831  BP: 127/85 132/85 121/86 (!) 138/96  Pulse: (!) 58 61 61 (!) 58  Resp: 16 16 16 16   Temp: 98 F (36.7 C) 98.2 F (36.8 C) 97.8 F (36.6 C) 98.1 F (36.7 C)  TempSrc:      SpO2: 100% 100% 99% 100%  Weight:      Height:        Intake/Output Summary (Last 24 hours) at 12/25/2020 12/27/2020 Last data filed at 12/24/2020 1646 Gross per 24 hour  Intake 2371.41 ml  Output -  Net 2371.41 ml   Filed Weights   12/21/20 1710 12/23/20 0328  Weight: 74.8 kg 73.3 kg    Examination:  General: A/O x4, No acute respiratory distress Eyes: negative scleral hemorrhage, negative anisocoria, negative icterus ENT: Negative Runny nose, negative gingival bleeding, Neck:  Negative scars, masses, torticollis, lymphadenopathy, JVD Lungs: Clear to auscultation bilaterally without wheezes or crackles Cardiovascular: Regular rate and rhythm without murmur gallop or rub normal S1 and S2 Abdomen: Minimal abdominal pain patient RUQ/LUQ, nondistended, positive soft, bowel sounds, no rebound, no ascites, no appreciable mass Extremities: No significant cyanosis, clubbing, or edema bilateral lower extremities Skin: Negative rashes, lesions, ulcers Psychiatric:  Negative depression, negative anxiety, negative fatigue, negative mania  Central nervous system:  Cranial nerves [name] through XII intact, tongue/uvula midline, all extremities muscle strength 5/5, sensation intact throughout, negative dysarthria, negative expressive aphasia, negative receptive aphasia.  .     Data Reviewed: Care during the described time interval was provided by me .  I have reviewed this patient's available data, including medical history, events of note, physical examination, and all test results as part of my evaluation.  CBC: Recent Labs  Lab 12/21/20 1731 12/22/20 0506 12/23/20 0505 12/24/20 0510 12/25/20 0416  WBC 11.6* 7.1 3.8* 2.6* 2.7*  NEUTROABS  --   --   --  0.9*  0.6*  HGB 16.9 14.2 14.3 13.7 14.3  HCT 46.8 40.4 40.3 38.6* 40.6  MCV 89.3 90.4 91.6 91.5 91.0  PLT 253 197 185 175 193   Basic Metabolic Panel: Recent Labs  Lab 12/21/20 1731 12/21/20 2018 12/22/20 0506 12/23/20 0505 12/24/20 0510 12/25/20 0416  NA 138  --  140 140 137 136  K 3.9  --  3.4* 3.4* 3.4* 3.4*  CL 105  --  107 105 101 101  CO2 18*  --  25 24 29 27   GLUCOSE 76  --  108* 89 122* 146*  BUN 7  --  7 8 5* <5*  CREATININE 0.71  --  0.61 0.77 0.75 0.68  CALCIUM 9.5  --  8.2* 8.9 9.0 9.2  MG  --  1.9  --   --  1.7 2.0  PHOS  --   --  3.2  --  3.2 2.9   GFR: Estimated Creatinine Clearance: 102.1 mL/min (by C-G formula based on SCr of 0.68 mg/dL). Liver Function Tests: Recent Labs  Lab 12/21/20 1731 12/23/20 0505 12/24/20 0510 12/25/20 0416  AST 62* 49* 62*  77*  ALT 53* 38 43 55*  ALKPHOS 99 65 59 63  BILITOT 1.0 0.9 0.8 0.7  PROT 9.1* 7.3 7.0 7.3  ALBUMIN 4.4 3.5 3.4* 3.5   Recent Labs  Lab 12/21/20 1731 12/22/20 0506 12/23/20 0505 12/24/20 0510 12/25/20 0416  LIPASE 394* 140* 53* 33 26   No results for input(s): AMMONIA in the last 168 hours. Coagulation Profile: No results for input(s): INR, PROTIME in the last 168 hours. Cardiac Enzymes: No results for input(s): CKTOTAL, CKMB, CKMBINDEX, TROPONINI in the last 168 hours. BNP (last 3 results) No results for input(s): PROBNP in the last 8760 hours. HbA1C: No results for input(s): HGBA1C in the last 72 hours. CBG: No results for input(s): GLUCAP in the last 168 hours. Lipid Profile: No results for input(s): CHOL, HDL, LDLCALC, TRIG, CHOLHDL, LDLDIRECT in the last 72 hours. Thyroid Function Tests: No results for input(s): TSH, T4TOTAL, FREET4, T3FREE, THYROIDAB in the last 72 hours. Anemia Panel: No results for input(s): VITAMINB12, FOLATE, FERRITIN, TIBC, IRON, RETICCTPCT in the last 72 hours. Sepsis Labs: No results for input(s): PROCALCITON, LATICACIDVEN in the last 168 hours.  Recent  Results (from the past 240 hour(s))  SARS CORONAVIRUS 2 (TAT 6-24 HRS) Nasopharyngeal Nasopharyngeal Swab     Status: None   Collection Time: 12/21/20  8:10 PM   Specimen: Nasopharyngeal Swab  Result Value Ref Range Status   SARS Coronavirus 2 NEGATIVE NEGATIVE Final    Comment: (NOTE) SARS-CoV-2 target nucleic acids are NOT DETECTED.  The SARS-CoV-2 RNA is generally detectable in upper and lower respiratory specimens during the acute phase of infection. Negative results do not preclude SARS-CoV-2 infection, do not rule out co-infections with other pathogens, and should not be used as the sole basis for treatment or other patient management decisions. Negative results must be combined with clinical observations, patient history, and epidemiological information. The expected result is Negative.  Fact Sheet for Patients: HairSlick.no  Fact Sheet for Healthcare Providers: quierodirigir.com  This test is not yet approved or cleared by the [place] FDA and  has been authorized for detection and/or diagnosis of SARS-CoV-2 by FDA under an Emergency Use Authorization (EUA). This EUA will remain  in effect (meaning this test can be used) for the duration of the COVID-19 declaration under Se ction 564(b)(1) of the Act, 21 U.S.C. section 360bbb-3(b)(1), unless the authorization is terminated or revoked sooner.  Performed at [facility], [address] [place], [place] [address]          Radiology Studies: No results found.      Scheduled Meds: . COVID-19 mRNA vaccine (Moderna)  0.5 mL Intramuscular ONCE-1600  . diltiazem  120 mg Oral Daily  . enoxaparin (LOVENOX) injection  40 mg Subcutaneous Q24H  . folic acid  1 mg Oral Daily  . multivitamin with minerals  1 tablet Oral Daily  . tamsulosin  0.4 mg Oral Daily  . thiamine  100 mg Oral Daily   Or  . thiamine  100 mg Intravenous Daily   Continuous  Infusions: . sodium chloride 100 mL/hr at 12/25/20 0935     LOS: 4 days    Time spent:40 min    WOODS, Roselind Messier, MD [facility]   If 7PM-7AM, please contact night-coverage 12/25/2020, 9:53 AM

## 2020-12-25 NOTE — Progress Notes (Signed)
Per RN, KCL IV ordered expired before patient could receive all 6 ordered doses.  Pharmacist reordered KCL IV x 3 doses per RN request.   Gardner Candle, PharmD, BCPS Clinical Pharmacist 12/25/2020 8:27 PM

## 2020-12-25 NOTE — Plan of Care (Signed)

## 2020-12-26 LAB — COMPREHENSIVE METABOLIC PANEL
ALT: 65 U/L — ABNORMAL HIGH (ref 0–44)
AST: 78 U/L — ABNORMAL HIGH (ref 15–41)
Albumin: 3.4 g/dL — ABNORMAL LOW (ref 3.5–5.0)
Alkaline Phosphatase: 68 U/L (ref 38–126)
Anion gap: 8 (ref 5–15)
BUN: 7 mg/dL (ref 6–20)
CO2: 28 mmol/L (ref 22–32)
Calcium: 9.2 mg/dL (ref 8.9–10.3)
Chloride: 101 mmol/L (ref 98–111)
Creatinine, Ser: 0.77 mg/dL (ref 0.61–1.24)
GFR, Estimated: >60 mL/min (ref >60)
Glucose, Bld: 169 mg/dL — ABNORMAL HIGH (ref 70–99)
Potassium: 4.1 mmol/L (ref 3.5–5.1)
Sodium: 137 mmol/L (ref 135–145)
Total Bilirubin: 0.4 mg/dL (ref 0.3–1.2)
Total Protein: 7.3 g/dL (ref 6.5–8.1)

## 2020-12-26 LAB — CBC WITH DIFFERENTIAL/PLATELET
Abs Immature Granulocytes: 0.01 10*3/uL (ref 0.00–0.07)
Basophils Absolute: 0 10*3/uL (ref 0.0–0.1)
Basophils Relative: 1 %
Eosinophils Absolute: 0.1 10*3/uL (ref 0.0–0.5)
Eosinophils Relative: 3 %
HCT: 40.9 % (ref 39.0–52.0)
Hemoglobin: 14.8 g/dL (ref 13.0–17.0)
Immature Granulocytes: 0 %
Lymphocytes Relative: 59 %
Lymphs Abs: 1.6 10*3/uL (ref 0.7–4.0)
MCH: 33 pg (ref 26.0–34.0)
MCHC: 36.2 g/dL — ABNORMAL HIGH (ref 30.0–36.0)
MCV: 91.1 fL (ref 80.0–100.0)
Monocytes Absolute: 0.4 10*3/uL (ref 0.1–1.0)
Monocytes Relative: 16 %
Neutro Abs: 0.5 10*3/uL — ABNORMAL LOW (ref 1.7–7.7)
Neutrophils Relative %: 21 %
Platelets: 198 10*3/uL (ref 150–400)
RBC: 4.49 MIL/uL (ref 4.22–5.81)
RDW: 12.1 % (ref 11.5–15.5)
WBC: 2.6 10*3/uL — ABNORMAL LOW (ref 4.0–10.5)
nRBC: 0 % (ref 0.0–0.2)

## 2020-12-26 LAB — LIPASE, BLOOD: Lipase: 27 U/L (ref 11–51)

## 2020-12-26 LAB — MAGNESIUM: Magnesium: 2 mg/dL (ref 1.7–2.4)

## 2020-12-26 LAB — PHOSPHORUS: Phosphorus: 3.6 mg/dL (ref 2.5–4.6)

## 2020-12-26 MED ORDER — HYDROCODONE-ACETAMINOPHEN 5-325 MG PO TABS: 1.0000 | ORAL_TABLET | ORAL | Status: DC | PRN

## 2020-12-26 MED ORDER — TAMSULOSIN HCL 0.4 MG PO CAPS: 0.4000 mg | ORAL_CAPSULE | Freq: Every day | ORAL | 0 refills | Status: DC

## 2020-12-26 MED ORDER — DILTIAZEM HCL ER COATED BEADS 120 MG PO CP24: 120.0000 mg | ORAL_CAPSULE | Freq: Every day | ORAL | 0 refills | Status: DC

## 2020-12-26 MED ORDER — THIAMINE HCL 100 MG PO TABS: 100.0000 mg | ORAL_TABLET | Freq: Every day | ORAL | 0 refills | Status: DC

## 2020-12-26 MED ORDER — FOLIC ACID 1 MG PO TABS: 1.0000 mg | ORAL_TABLET | Freq: Every day | ORAL | 0 refills | Status: DC

## 2020-12-26 NOTE — Progress Notes (Signed)
Patient discharging home. Instructions given to patient, verbalized understanding.

## 2020-12-26 NOTE — Plan of Care (Signed)

## 2020-12-26 NOTE — Discharge Instructions (Signed)
Acute Pancreatitis  Acute pancreatitis happens when the pancreas gets swollen. The pancreas is a large gland in the body that helps to control blood sugar. It also makes enzymes that help to digest food. This condition can last a few days and cause serious problems. The lungs, heart, and kidneys may stop working. What are the causes? Causes include:  Alcohol abuse.  Drug abuse.  Gallstones.  A tumor in the pancreas. Other causes include:  Some medicines.  Some chemicals.  Diabetes.  An infection.  Damage caused by an accident.  The poison (venom) from a scorpion bite.  Belly (abdominal) surgery.  The body's defense system (immune system) attacking the pancreas (autoimmune pancreatitis).  Genes that are passed from parent to child (inherited). In some cases, the cause is not known. What are the signs or symptoms?  Pain in the upper belly that may be felt in the back. The pain may be very bad.  Swelling of the belly.  Feeling sick to your stomach (nauseous) and throwing up (vomiting).  Fever. How is this treated? You will likely have to stay in the hospital. Treatment may include:  Pain medicine.  Fluid through an IV tube.  Placing a tube in the stomach to take out the stomach contents. This may help you stop throwing up.  Not eating for 3-4 days.  Antibiotic medicines, if you have an infection.  Treating any other problems that may be the cause.  Steroid medicines, if your problem is caused by your defense system attacking your body's own tissues.  Surgery. Follow these instructions at home: Eating and drinking  Follow instructions from your doctor about what to eat and drink.  Eat foods that do not have a lot of fat in them.  Eat small meals often. Do not eat big meals.  Drink enough fluid to keep your pee (urine) pale yellow.  Do not drink alcohol if it caused your condition.   Medicines  Take over-the-counter and prescription medicines only  as told by your doctor.  Ask your doctor if the medicine prescribed to you: ? Requires you to avoid driving or using heavy machinery. ? Can cause trouble pooping (constipation). You may need to take steps to prevent or treat trouble pooping:  Take over-the-counter or prescription medicines.  Eat foods that are high in fiber. These include beans, whole grains, and fresh fruits and vegetables.  Limit foods that are high in fat and sugar. These include fried or sweet foods. General instructions  Do not use any products that contain nicotine or tobacco, such as cigarettes, e-cigarettes, and chewing tobacco. If you need help quitting, ask your doctor.  Get plenty of rest.  Check your blood sugar at home as told by your doctor.  Keep all follow-up visits as told by your doctor. This is important. Contact a doctor if:  You do not get better as quickly as expected.  You have new symptoms.  Your symptoms get worse.  You have pain or weakness that lasts a long time.  You keep feeling sick to your stomach.  You get better and then you have pain again.  You have a fever. Get help right away if:  You cannot eat or keep fluids down.  Your pain gets very bad.  Your skin or the white part of your eyes turns yellow.  You have sudden swelling in your belly.  You throw up.  You feel dizzy or you pass out (faint).  Your blood sugar is high (over   300 mg/dL). Summary  Acute pancreatitis happens when the pancreas gets swollen.  This condition is often caused by alcohol abuse, drug abuse, or gallstones.  You will likely have to stay in the hospital for treatment. This information is not intended to replace advice given to you by your health care provider. Make sure you discuss any questions you have with your health care provider. Document Revised: 08/13/2018 Document Reviewed: 08/13/2018 Elsevier Patient Education  2021 [employer]  

## 2020-12-26 NOTE — Discharge Summary (Signed)
Bruce [name] JXB:[id number] DOB: 1971-04-03 DOA: 12/21/2020  PCP: Patient, No Pcp Per  Admit date: 12/21/2020 Discharge date: 12/26/2020  Admitted From: Home Disposition: Home  Recommendations for Outpatient Follow-up:  1. Follow up with PCP in 1 week 2. Please obtain BMP/CBC in one week 3. Cardiology in 1 week Bruce [name] 4. Follow-up with hematology in 1 week Dr. Cathie Hoops     Discharge Condition:Stable CODE STATUS: Full Diet recommendation: Heart Healthy  Brief/Interim Summary: Per ZHY:QMVHQ J Bruce [name] is a 50 y.o. African-American male with medical history significant for paroxysmal atrial fibrillation, polysubstance abuse and hepatitis C, who presented to the emergency room with acute onset of left upper quadrant and epigastric abdominal pain that started since this morning with associated recurrent nausea and vomiting.  He was found with A. fib RVR in the ER.  CT of abdomen revealed acute edematous pancreatitis he was admitted to the hospital for further patient.  Cardiology was consulted.  Per nursing patient has not been scoring overnight.  His pain has decreased and he tolerated p.o. intake he is stable to be discharged home   Acute EtOH pancreatitis/Hx EtOH abuse. -Recurrent, episode of pancreatitis.  Last episode  5 months ago -Lengthy education at bedside about complete alcohol cessation  Lipase normal Tolerated diet  Substance abuse -2/14 UDS positive cocaine -He was weaned off of Dilaudid.  Transition to p.o. narcotics.   Counseled.   There is concern for seeking behavior    Acute paroxysmal atrial fibrillation with a rapid ventricular response. -Cardiology was consulted -Echo normal EF 55 to 60% 2020, repeat echo pending -Off diltiazem drip.  On p.o. Cardizem Follow-up cardiology as outpatient   Essential hypertension On Cardizem    BPH Follow-up PCP as outpatient -Flomax 0.4 mg daily  Hypokalemia Repleted and stable   Hypomagnesmia Stable,  mag 2.0   Pain management -Counseled patient that given his alcohol use and cocaine use would not be discharged on narcotics.  He is to follow-up with PCP or pain management as outpatient  Neutropenia - levamisole in cocaine may be the cause of patient's new onset neutropenia.  We will also obtain HIV screen -Smear analyzed by Dr Bruce [name] Pathology.  Stated ANC today is 600 per microL. Platelets and RBCs are OK. No abnormal WBCs are seen. Further investigation as to cause may be needed if neutropenia doesn't resolve. -Refer to PCP and hematology/oncology at discharge for monitoring and possible further work-up       Discharge Diagnoses:  Active Problems:   Alcohol abuse   Polysubstance abuse (HCC)   Alcohol induced acute pancreatitis without necrosis or infection   Atrial fibrillation with RVR (HCC)   BPH (benign prostatic hyperplasia)    Discharge Instructions  Discharge Instructions    Amb referral to [facility]   Complete by: As directed    Diet - low sodium heart healthy   Complete by: As directed    Discharge instructions   Complete by: As directed    F/u with pcp in one week, get blood work   Increase activity slowly   Complete by: As directed      Allergies as of 12/26/2020      Reactions   Penicillins Other (See Comments)   Did it involve swelling of the face/tongue/throat, SOB, or low BP? No Did it involve sudden or severe rash/hives, skin peeling, or any reaction on the inside of your mouth or nose? No Did you need to seek medical attention at a  hospital or doctor's office? No When did it last happen? If all above answers are "NO", may proceed with cephalosporin use.      Medication List    STOP taking these medications   amLODipine 10 MG tablet Commonly known as: NORVASC     TAKE these medications   diltiazem 120 MG 24 hr capsule Commonly known as: CARDIZEM CD Take 1 capsule (120 mg total) by mouth daily. Start taking on: December 27, 2020   folic acid 1 MG tablet Commonly known as: FOLVITE Take 1 tablet (1 mg total) by mouth daily. Start taking on: December 27, 2020   pantoprazole 40 MG tablet Commonly known as: Protonix Take 1 tablet (40 mg total) by mouth daily.   tamsulosin 0.4 MG Caps capsule Commonly known as: FLOMAX Take 1 capsule (0.4 mg total) by mouth daily. Start taking on: December 27, 2020   thiamine 100 MG tablet Take 1 tablet (100 mg total) by mouth daily. Start taking on: December 27, 2020       Follow-up Information    Sondra Barges, PA-C On 01/07/2021.   Specialties: Physician Assistant, Cardiology, Radiology Why: @ 11am Contact information: [address] [place] [address] [phone]        Rickard Patience, MD Follow up in 1 week(s).   Specialty: Oncology Why: Marlise Eves information: [address] [place] [address] [phone]              Allergies  Allergen Reactions  . Penicillins Other (See Comments)    Did it involve swelling of the face/tongue/throat, SOB, or low BP? No Did it involve sudden or severe rash/hives, skin peeling, or any reaction on the inside of your mouth or nose? No Did you need to seek medical attention at a hospital or doctor's office? No When did it last happen? If all above answers are "NO", may proceed with cephalosporin use.    Consultations:     Procedures/Studies: DG Chest 1 View  Result Date: 12/21/2020 CLINICAL DATA:  Generalized abdominal pain and vomiting, atrial fibrillation, tachycardia EXAM: CHEST  1 VIEW COMPARISON:  08/28/2020 FINDINGS: The heart size and mediastinal contours are within normal limits. Both lungs are clear. The visualized skeletal structures are unremarkable. IMPRESSION: No active disease. Electronically Signed   By: Sharlet Salina M.D.   On: 12/21/2020 21:06   CT ABDOMEN PELVIS W CONTRAST  Result Date: 12/21/2020 CLINICAL DATA:  Acute abdominal pain. Vomiting.  History of pancreatitis. EXAM: CT ABDOMEN AND PELVIS WITH CONTRAST TECHNIQUE: Multidetector CT imaging of the abdomen and pelvis was performed using the standard protocol following bolus administration of intravenous contrast. CONTRAST:  OMNIPAQUE IOHEXOL 300 MG/ML  SOLN COMPARISON:  Most recent CT 06/29/2020 FINDINGS: Lower chest: Punctate right lower lobe pulmonary nodule, series 4, image 5, stable dating back to at least August 2020 exam and likely benign. No pleural fluid or acute airspace disease. Hepatobiliary: Diffusely decreased hepatic density consistent with steatosis. No focal hepatic abnormality. Unremarkable gallbladder. No gallstone or cholecystitis. There is no biliary dilatation. Pancreas: Mild diffuse peripancreatic fat stranding, most prominent involving the head and uncinate process. Homogeneous pancreatic enhancement without necrosis. No focal fluid collection. No ductal dilatation. No evidence of pancreatic mass. Spleen: Normal in size without focal abnormality. Adrenals/Urinary Tract: No adrenal nodule. No hydronephrosis or perinephric edema. Homogeneous renal enhancement with symmetric excretion on delayed phase imaging. Tiny low-density lesion in the upper right kidney is too small to characterize. Urinary bladder  is only partially distended, no wall thickening for degree of distension. Stomach/Bowel: Fluid distended stomach. No gastric wall thickening. Mild enhancement of the duodenum in the region of pancreatic inflammation, likely reactive. Decompressed small bowel. No small bowel obstruction. Normal appendix is coiled medial to the cecum, series 5, image 34. No colonic wall thickening or inflammation. Proximal sigmoid colon is redundant. Vascular/Lymphatic: Abdominal aorta is normal in caliber. Patent portal and splenic veins. Patent mesenteric vasculature. No acute vascular findings. No enlarged lymph nodes in the abdomen or pelvis. An 8 mm porta hepatis node is likely reactive.  Small calcified perihepatic nodes are unchanged. Reproductive: Prostate is unremarkable. Incidental note of bilateral hydroceles as well as right epididymal head cyst, chronic. Other: Fat stranding and minimal free fluid adjacent to the pancreas. No other ascites. No focal fluid collection or abscess. Musculoskeletal: There are no acute or suspicious osseous abnormalities. IMPRESSION: 1. Acute edematous pancreatitis. No evidence of pancreatic necrosis or acute peripancreatic fluid collection. 2. Hepatic steatosis. Electronically Signed   By: Narda Rutherford M.D.   On: 12/21/2020 21:54   ECHOCARDIOGRAM COMPLETE  Result Date: 12/22/2020    ECHOCARDIOGRAM REPORT   Patient Name:   Bruce [name] Date of Exam: 12/22/2020 Medical Rec #:  [id number]         Height:       67.0 in Accession #:    [id number]        Weight:       164.9 lb Date of Birth:  December 09, 1970         BSA:          1.863 m Patient Age:    49 years          BP:           131/81 mmHg Patient Gender: M                 HR:           71 bpm. Exam Location:  [facility] Procedure: 2D Echo, Color Doppler and Cardiac Doppler Indications:     Atrial Fibrillation I48.91  History:         Patient has prior history of Echocardiogram examinations, most                  recent 06/14/2019. Arrythmias:Atrial Fibrillation. PAF.  Sonographer:     Cristela Blue RDCS (AE) Referring Phys:  [id number] Vernetta Honey MANSY Diagnosing Phys: Cristal Deer End MD IMPRESSIONS  1. Left ventricular ejection fraction, by estimation, is 55 to 60%. The left ventricle has normal function. Left ventricular endocardial border not optimally defined to evaluate regional wall motion. There is mild left ventricular hypertrophy. Left ventricular diastolic parameters are indeterminate.  2. Right ventricular systolic function is normal. The right ventricular size is normal. Tricuspid regurgitation signal is inadequate for assessing PA pressure.  3. The mitral valve is normal in structure. Trivial mitral valve  regurgitation. No evidence of mitral stenosis.  4. The aortic valve has an indeterminant number of cusps. Aortic valve regurgitation is not visualized. No aortic stenosis is present. FINDINGS  Left Ventricle: Left ventricular ejection fraction, by estimation, is 55 to 60%. The left ventricle has normal function. Left ventricular endocardial border not optimally defined to evaluate regional wall motion. The left ventricular internal cavity size was normal in size. There is mild left ventricular hypertrophy. Left ventricular diastolic parameters are indeterminate. Right Ventricle: The right ventricular size is normal. No increase in right ventricular wall thickness. Right  ventricular systolic function is normal. Tricuspid regurgitation signal is inadequate for assessing PA pressure. Left Atrium: Left atrial size was normal in size. Right Atrium: Right atrial size was normal in size. Pericardium: Trivial pericardial effusion is present. Mitral Valve: The mitral valve is normal in structure. Trivial mitral valve regurgitation. No evidence of mitral valve stenosis. Tricuspid Valve: The tricuspid valve is normal in structure. Tricuspid valve regurgitation is trivial. Aortic Valve: The aortic valve has an indeterminant number of cusps. Aortic valve regurgitation is not visualized. No aortic stenosis is present. Aortic valve mean gradient measures 2.0 mmHg. Aortic valve peak gradient measures 3.4 mmHg. Aortic valve area, by VTI measures 2.45 cm. Pulmonic Valve: The pulmonic valve was normal in structure. Pulmonic valve regurgitation is not visualized. No evidence of pulmonic stenosis. Aorta: The aortic root is normal in size and structure. Venous: The inferior vena cava was not well visualized. IAS/Shunts: The interatrial septum was not well visualized.  LEFT VENTRICLE PLAX 2D LVIDd:         4.71 cm  Diastology LVIDs:         3.14 cm  LV e' medial:    7.62 cm/s LV PW:         1.08 cm  LV E/e' medial:  11.1 LV IVS:         0.91 cm  LV e' lateral:   8.05 cm/s LVOT diam:     2.00 cm  LV E/e' lateral: 10.5 LV SV:         44 LV SV Index:   24 LVOT Area:     3.14 cm  RIGHT VENTRICLE RV Basal diam:  2.31 cm RV S prime:     13.30 cm/s LEFT ATRIUM           Index       RIGHT ATRIUM           Index LA diam:      2.90 cm 1.56 cm/m  RA Area:     14.50 cm LA Vol (A2C): 51.9 ml 27.86 ml/m RA Volume:   36.20 ml  19.43 ml/m LA Vol (A4C): 23.5 ml 12.61 ml/m  AORTIC VALVE                   PULMONIC VALVE AV Area (Vmax):    2.46 cm    PV Vmax:        0.72 m/s AV Area (Vmean):   2.38 cm    PV Peak grad:   2.1 mmHg AV Area (VTI):     2.45 cm    RVOT Peak grad: 3 mmHg AV Vmax:           91.80 cm/s AV Vmean:          66.200 cm/s AV VTI:            0.181 m AV Peak Grad:      3.4 mmHg AV Mean Grad:      2.0 mmHg LVOT Vmax:         71.80 cm/s LVOT Vmean:        50.200 cm/s LVOT VTI:          0.141 m LVOT/AV VTI ratio: 0.78  AORTA Ao Root diam: 3.30 cm MITRAL VALVE MV Area (PHT): 3.39 cm    SHUNTS MV Decel Time: 224 msec    Systemic VTI:  0.14 m MV E velocity: 84.80 cm/s  Systemic Diam: 2.00 cm MV A velocity: 87.80 cm/s MV E/A ratio:  0.97 Yvonne Kendallhristopher End MD Electronically signed by Yvonne Kendallhristopher End MD Signature Date/Time: 12/22/2020/5:36:21 PM    Final        Subjective: No complaints other than he did not get pain meds this morning.  He tolerated his p.o. intake.  Discharge Exam: Vitals:   12/26/20 0615 12/26/20 0817  BP: (!) 136/93 (!) 143/99  Pulse: (!) 58 (!) 59  Resp: 16 16  Temp: 98.1 F (36.7 C) 98 F (36.7 C)  SpO2: 100% 100%   Vitals:   12/25/20 2033 12/25/20 2330 12/26/20 0615 12/26/20 0817  BP: 127/88 (!) 136/98 (!) 136/93 (!) 143/99  Pulse: (!) 59 68 (!) 58 (!) 59  Resp: 16 16 16 16   Temp: 98.4 F (36.9 C) 98 F (36.7 C) 98.1 F (36.7 C) 98 F (36.7 C)  TempSrc:      SpO2: 100% 100% 100% 100%  Weight:      Height:        General: Pt is alert, awake, not in acute distress Cardiovascular: RRR, S1/S2 +, no  rubs, no gallops Respiratory: CTA bilaterally, no wheezing, no rhonchi Abdominal: Soft, NT, ND, bowel sounds + Extremities: no edema    The results of significant diagnostics from this hospitalization (including imaging, microbiology, ancillary and laboratory) are listed below for reference.     Microbiology: Recent Results (from the past 240 hour(s))  SARS CORONAVIRUS 2 (TAT 6-24 HRS) Nasopharyngeal Nasopharyngeal Swab     Status: None   Collection Time: 12/21/20  8:10 PM   Specimen: Nasopharyngeal Swab  Result Value Ref Range Status   SARS Coronavirus 2 NEGATIVE NEGATIVE Final    Comment: (NOTE) SARS-CoV-2 target nucleic acids are NOT DETECTED.  The SARS-CoV-2 RNA is generally detectable in upper and lower respiratory specimens during the acute phase of infection. Negative results do not preclude SARS-CoV-2 infection, do not rule out co-infections with other pathogens, and should not be used as the sole basis for treatment or other patient management decisions. Negative results must be combined with clinical observations, patient history, and epidemiological information. The expected result is Negative.  Fact Sheet for Patients: HairSlick.nohttps://www.fda.gov/media/138098/download  Fact Sheet for Healthcare Providers: quierodirigir.comhttps://www.fda.gov/media/138095/download  This test is not yet approved or cleared by the [place] FDA and  has been authorized for detection and/or diagnosis of SARS-CoV-2 by FDA under an Emergency Use Authorization (EUA). This EUA will remain  in effect (meaning this test can be used) for the duration of the COVID-19 declaration under Se ction 564(b)(1) of the Act, 21 U.S.C. section 360bbb-3(b)(1), unless the authorization is terminated or revoked sooner.  Performed at [facility], [address] [place], [place] [id number]      Labs: BNP (last 3 results) No results for input(s): BNP in the last 8760 hours. Basic Metabolic Panel: Recent Labs   Lab 12/21/20 2018 12/22/20 0506 12/23/20 0505 12/24/20 0510 12/25/20 0416 12/26/20 0434  NA  --  140 140 137 136 137  K  --  3.4* 3.4* 3.4* 3.4* 4.1  CL  --  107 105 101 101 101  CO2  --  25 24 29 27 28   GLUCOSE  --  108* 89 122* 146* 169*  BUN  --  7 8 5* <5* 7  CREATININE  --  0.61 0.77 0.75 0.68 0.77  CALCIUM  --  8.2* 8.9 9.0 9.2 9.2  MG 1.9  --   --  1.7 2.0 2.0  PHOS  --  3.2  --  3.2 2.9  3.6   Liver Function Tests: Recent Labs  Lab 12/21/20 1731 12/23/20 0505 12/24/20 0510 12/25/20 0416 12/26/20 0434  AST 62* 49* 62* 77* 78*  ALT 53* 38 43 55* 65*  ALKPHOS 99 65 59 63 68  BILITOT 1.0 0.9 0.8 0.7 0.4  PROT 9.1* 7.3 7.0 7.3 7.3  ALBUMIN 4.4 3.5 3.4* 3.5 3.4*   Recent Labs  Lab 12/22/20 0506 12/23/20 0505 12/24/20 0510 12/25/20 0416 12/26/20 0434  LIPASE 140* 53* 33 26 27   No results for input(s): AMMONIA in the last 168 hours. CBC: Recent Labs  Lab 12/22/20 0506 12/23/20 0505 12/24/20 0510 12/25/20 0416 12/26/20 0434  WBC 7.1 3.8* 2.6* 2.7* 2.6*  NEUTROABS  --   --  0.9* 0.6* 0.5*  HGB 14.2 14.3 13.7 14.3 14.8  HCT 40.4 40.3 38.6* 40.6 40.9  MCV 90.4 91.6 91.5 91.0 91.1  PLT 197 185 175 193 198   Cardiac Enzymes: No results for input(s): CKTOTAL, CKMB, CKMBINDEX, TROPONINI in the last 168 hours. BNP: Invalid input(s): POCBNP CBG: No results for input(s): GLUCAP in the last 168 hours. D-Dimer No results for input(s): DDIMER in the last 72 hours. Hgb A1c No results for input(s): HGBA1C in the last 72 hours. Lipid Profile No results for input(s): CHOL, HDL, LDLCALC, TRIG, CHOLHDL, LDLDIRECT in the last 72 hours. Thyroid function studies No results for input(s): TSH, T4TOTAL, T3FREE, THYROIDAB in the last 72 hours.  Invalid input(s): FREET3 Anemia work up No results for input(s): VITAMINB12, FOLATE, FERRITIN, TIBC, IRON, RETICCTPCT in the last 72 hours. Urinalysis    Component Value Date/Time   COLORURINE YELLOW (A) 12/21/2020 2010    APPEARANCEUR CLEAR (A) 12/21/2020 2010   LABSPEC 1.023 12/21/2020 2010   PHURINE 5.0 12/21/2020 2010   GLUCOSEU NEGATIVE 12/21/2020 2010   HGBUR NEGATIVE 12/21/2020 2010   BILIRUBINUR NEGATIVE 12/21/2020 2010   KETONESUR 20 (A) 12/21/2020 2010   PROTEINUR NEGATIVE 12/21/2020 2010   UROBILINOGEN 1.0 05/01/2012 0835   NITRITE NEGATIVE 12/21/2020 2010   LEUKOCYTESUR NEGATIVE 12/21/2020 2010   Sepsis Labs Invalid input(s): PROCALCITONIN,  WBC,  LACTICIDVEN Microbiology Recent Results (from the past 240 hour(s))  SARS CORONAVIRUS 2 (TAT 6-24 HRS) Nasopharyngeal Nasopharyngeal Swab     Status: None   Collection Time: 12/21/20  8:10 PM   Specimen: Nasopharyngeal Swab  Result Value Ref Range Status   SARS Coronavirus 2 NEGATIVE NEGATIVE Final    Comment: (NOTE) SARS-CoV-2 target nucleic acids are NOT DETECTED.  The SARS-CoV-2 RNA is generally detectable in upper and lower respiratory specimens during the acute phase of infection. Negative results do not preclude SARS-CoV-2 infection, do not rule out co-infections with other pathogens, and should not be used as the sole basis for treatment or other patient management decisions. Negative results must be combined with clinical observations, patient history, and epidemiological information. The expected result is Negative.  Fact Sheet for Patients: HairSlick.no  Fact Sheet for Healthcare Providers: quierodirigir.com  This test is not yet approved or cleared by the [place] FDA and  has been authorized for detection and/or diagnosis of SARS-CoV-2 by FDA under an Emergency Use Authorization (EUA). This EUA will remain  in effect (meaning this test can be used) for the duration of the COVID-19 declaration under Se ction 564(b)(1) of the Act, 21 U.S.C. section 360bbb-3(b)(1), unless the authorization is terminated or revoked sooner.  Performed at [facility], [address], [place], [place] [address]      Time coordinating  discharge: Over 30 minutes  SIGNED:   Lynn Ito, MD  [facility] 12/26/2020, 11:21 AM Pager   If 7PM-7AM, please contact night-coverage [email or web address] Password [email or web address]

## 2020-12-29 LAB — HIV-1/2 AB - DIFFERENTIATION
HIV 1 Ab: NONREACTIVE
HIV 2 Ab: NONREACTIVE
Note: NEGATIVE

## 2021-01-01 LAB — MISC LABCORP TEST (SEND OUT): Labcorp test code: 83962

## 2021-01-12 ENCOUNTER — Inpatient Hospital Stay: Payer: Self-pay | Admitting: Internal Medicine

## 2021-01-12 ENCOUNTER — Inpatient Hospital Stay: Payer: Self-pay

## 2021-01-24 NOTE — Progress Notes (Deleted)
Cardiology Office Note    Date:  01/24/2021   ID:  Bruce [name], DOB 01-20-1971, MRN [id number]  PCP:  Patient, No Pcp Per  Cardiologist:  Ida Rogue, MD  Electrophysiologist:  None   Chief Complaint: Hospital follow-up  History of Present Illness:   Bruce [name] is a 50 y.o. male with history of PAF, polysubstance abuse with ongoing cocaine, alcohol, and tobacco abuse, HTN, hepatitis C in the setting of IV drug abuse, and medication nonadherence who presents for hospital follow-up as outlined below.  We first met him in 03/2018 during hospital admission for newly diagnosed A. fib in the context of binge drinking, cocaine, and tobacco use.  Echo at that time showed an EF of 60 to 65%, no regional wall motion abnormalities, grade 2 diastolic dysfunction, mild mitral regurgitation, normal size left atrium, normal RV systolic function and ventricular cavity size, and PASP normal.  He converted to sinus rhythm with rate control.  He was not started on Hemphill at that time given CHA2DS2-VASc of 0.  He was seen in the ED in 09/2018 with recurrent A. fib with RVR in the setting of ongoing cocaine use.  He was started on flecainide and continued on Cardizem.  He converted in the ED.  He was not placed on Jones.  Subsequent nuclear stress test in 10/2018 showed no evidence of ischemia and was overall low risk.  Outpatient cardiac monitoring in 10/2018 showed a predominant rhythm of sinus with an average heart rate of 85 bpm, 25 runs of SVT with the longest episode lasting 14 beats.  Patient triggered events were not associated with significant arrhythmia.  He was last seen in the office in 10/2018.  Since that time he has been seen in the ED or admitted to the hospital numerous times with varying complaints including atypical chest pain, ongoing cocaine use, and acute alcohol induced pancreatitis.  Echo during admission in 06/2019 for pancreatitis showed an EF of 55 to 60%, normal LV diastolic function  parameters, normal RV systolic function and ventricular cavity size, normal PASP, and no significant valvular abnormalities.  He was most recently admitted to the hospital from 2/14 through 2/19 with abdominal and chest pain and was noted to be in A. fib with RVR.  CT of the chest showed findings consistent with recurrent pancreatitis.  Urine drug screen was again positive for cocaine.  He was placed on a diltiazem infusion for rate control and subsequently converted to sinus rhythm.  Echo showed an EF of 55 to 60%, mild LVH, normal RV systolic function and ventricular cavity size, and trivial mitral regurgitation.  High-sensitivity troponin of 18.  It was felt his episode of recurrent A. fib was in the setting of medication noncompliance, polysubstance use, and pancreatitis.  It was recommended he be continued on diltiazem at discharge and in the setting of his CHA2DS2-VASc of 1 with history of noncompliance anticoagulation was deferred.  ***   Labs independently reviewed: 12/2020 -Hgb 14.8, PLT 198, magnesium 2.0, potassium 4.1, BUN 7, serum creatinine 0.77, albumin 3.4, AST 78, ALT 65, A1c 5.8, TSH 0.219, free T4 normal, TC 125, TG 57, HDL 66, LDL 48  Past Medical History:  Diagnosis Date  . A-fib ([place])   . Dental caries   . Hepatitis C infection   . Iritis   . PAF (paroxysmal atrial fibrillation) ([place])    a. diagnosed 03/19/18; b. CHADS2VASc 0  . Polysubstance abuse ([place])    a. cocaine,  tobacco, etoh  . Scrotal mass   . Tick borne fever   . Uveitis     Past Surgical History:  Procedure Laterality Date  . FINGER SURGERY     infection, middle left  . fractured jaw    . SHOULDER SURGERY     right    Current Medications: No outpatient medications have been marked as taking for the 01/27/21 encounter (Appointment) with Rise Mu, PA-C.    Allergies:   Penicillins   Social History   Socioeconomic History  . Marital status: Single    Spouse name: Not on file  . Number of  children: 0  . Years of education: Not on file  . Highest education level: Not on file  Occupational History    Employer: UNEMPLOYE  Tobacco Use  . Smoking status: Current Some Day Smoker    Packs/day: 0.25    Types: Cigarettes  . Smokeless tobacco: Never Used  . Tobacco comment: Per pt  " I only smoke cigarettes when I drink beer"  Vaping Use  . Vaping Use: Never used  Substance and Sexual Activity  . Alcohol use: Yes    Comment: 6 pack per week  . Drug use: Yes    Types: IV, Cocaine    Comment: Pt states that he last used "a while back"  . Sexual activity: Not on file  Other Topics Concern  . Not on file  Social History Narrative  . Not on file   Social Determinants of Health   Financial Resource Strain: Not on file  Food Insecurity: Not on file  Transportation Needs: Not on file  Physical Activity: Not on file  Stress: Not on file  Social Connections: Not on file     Family History:  The patient's family history includes Drug abuse in his father.  ROS:   ROS   EKGs/Labs/Other Studies Reviewed:    Studies reviewed were summarized above. The additional studies were reviewed today:  2D echo 12/2020: 1. Left ventricular ejection fraction, by estimation, is 55 to 60%. The  left ventricle has normal function. Left ventricular endocardial border  not optimally defined to evaluate regional wall motion. There is mild left  ventricular hypertrophy. Left  ventricular diastolic parameters are indeterminate.  2. Right ventricular systolic function is normal. The right ventricular  size is normal. Tricuspid regurgitation signal is inadequate for assessing  PA pressure.  3. The mitral valve is normal in structure. Trivial mitral valve  regurgitation. No evidence of mitral stenosis.  4. The aortic valve has an indeterminant number of cusps. Aortic valve  regurgitation is not visualized. No aortic stenosis is present. __________  2D echo 06/2019: 1. The left  ventricle has normal systolic function, with an ejection  fraction of 55-60%. The cavity size was normal. Left ventricular diastolic  parameters were normal.  2. The right ventricle has normal systolic function. The cavity was  normal. There is no increase in right ventricular wall thickness. Right  ventricular systolic pressure is normal with an estimated pressure of 20.6  mmHg.  3. No evidence of mitral valve stenosis.  4. The tricuspid valve is grossly normal.  5. The aortic valve is grossly normal. No stenosis of the aortic valve.  6. The aorta is normal unless otherwise noted. __________  Nuclear stress test 10/2018:  T wave inversion was noted during stress in the III leads.  There was no ST segment deviation noted during stress.  The study is normal.  This is  a low risk study.  Nuclear stress EF: 50%.  __________  Elwyn Reach patch 10/2018: Normal sinus rhythm Avg HR of 85 bpm.  25 Supraventricular Tachycardia runs occurred, the run with the fastest interval lasting 5 beats with a max rate of 207 bpm, the longest lasting 14 beats with an avg rate of 156 bpm.   Isolated SVEs were occasional (1.1%, 15308), SVE Couplets were rare (<1.0%, 137), and SVE Triplets were rare (<1.0%, 42). Isolated VEs were rare (<1.0%), VE Couplets were rare (<1.0%), and no VE Triplets were present. Ventricular Bigeminy was present.  Patient triggered events were not associated with significant arrhythmia __________  2D echo 03/2018: - Left ventricle: The cavity size was normal. Systolic function was  normal. The estimated ejection fraction was in the range of 60%  to 65%. Wall motion was normal; there were no regional wall  motion abnormalities. Features are consistent with a pseudonormal  left ventricular filling pattern, with concomitant abnormal  relaxation and increased filling pressure (grade 2 diastolic  dysfunction).  - Mitral valve: There was mild regurgitation.  -  Left atrium: The atrium was normal in size.  - Right ventricle: Systolic function was normal.  - Pulmonary arteries: Systolic pressure was within the normal  range.   EKG:  EKG is ordered today.  The EKG ordered today demonstrates ***  Recent Labs: 12/21/2020: TSH 0.219 12/26/2020: ALT 65; BUN 7; Creatinine, Ser 0.77; Hemoglobin 14.8; Magnesium 2.0; Platelets 198; Potassium 4.1; Sodium 137  Recent Lipid Panel    Component Value Date/Time   CHOL 125 12/22/2020 0506   TRIG 57 12/22/2020 0506   HDL 66 12/22/2020 0506   CHOLHDL 1.9 12/22/2020 0506   VLDL 11 12/22/2020 0506   LDLCALC 48 12/22/2020 0506    PHYSICAL EXAM:    VS:  There were no vitals taken for this visit.  BMI: There is no height or weight on file to calculate BMI.  Physical Exam  Wt Readings from Last 3 Encounters:  12/23/20 161 lb 9.6 oz (73.3 kg)  09/28/20 165 lb (74.8 kg)  08/28/20 165 lb (74.8 kg)     ASSESSMENT & PLAN:   1. ***  Disposition: F/u with Dr. Rockey Situ or an APP in ***.   Medication Adjustments/Labs and Tests Ordered: Current medicines are reviewed at length with the patient today.  Concerns regarding medicines are outlined above. Medication changes, Labs and Tests ordered today are summarized above and listed in the Patient Instructions accessible in Encounters.   Signed, Christell Faith, PA-C 01/24/2021 8:59 AM     [address] [place], [place] [address] [phone]

## 2021-01-25 ENCOUNTER — Inpatient Hospital Stay: Payer: Self-pay

## 2021-01-25 ENCOUNTER — Encounter: Payer: Self-pay | Admitting: Internal Medicine

## 2021-01-25 ENCOUNTER — Inpatient Hospital Stay: Payer: Self-pay | Attending: Internal Medicine | Admitting: Internal Medicine

## 2021-01-25 ENCOUNTER — Other Ambulatory Visit: Payer: Self-pay

## 2021-01-25 VITALS — BP 127/97 | HR 89 | Temp 98.0°F | Resp 18 | Ht 67.0 in | Wt 162.5 lb

## 2021-01-25 DIAGNOSIS — D709 Neutropenia, unspecified: Secondary | ICD-10-CM | POA: Insufficient documentation

## 2021-01-25 DIAGNOSIS — F17210 Nicotine dependence, cigarettes, uncomplicated: Secondary | ICD-10-CM | POA: Insufficient documentation

## 2021-01-25 DIAGNOSIS — F1010 Alcohol abuse, uncomplicated: Secondary | ICD-10-CM | POA: Insufficient documentation

## 2021-01-25 DIAGNOSIS — B1920 Unspecified viral hepatitis C without hepatic coma: Secondary | ICD-10-CM | POA: Insufficient documentation

## 2021-01-25 LAB — CBC WITH DIFFERENTIAL/PLATELET
Abs Immature Granulocytes: 0.01 10*3/uL (ref 0.00–0.07)
Basophils Absolute: 0 10*3/uL (ref 0.0–0.1)
Basophils Relative: 0 %
Eosinophils Absolute: 0.1 10*3/uL (ref 0.0–0.5)
Eosinophils Relative: 3 %
HCT: 44.2 % (ref 39.0–52.0)
Hemoglobin: 15.5 g/dL (ref 13.0–17.0)
Immature Granulocytes: 0 %
Lymphocytes Relative: 55 %
Lymphs Abs: 1.7 10*3/uL (ref 0.7–4.0)
MCH: 32.1 pg (ref 26.0–34.0)
MCHC: 35.1 g/dL (ref 30.0–36.0)
MCV: 91.5 fL (ref 80.0–100.0)
Monocytes Absolute: 0.4 10*3/uL (ref 0.1–1.0)
Monocytes Relative: 12 %
Neutro Abs: 0.9 10*3/uL — ABNORMAL LOW (ref 1.7–7.7)
Neutrophils Relative %: 30 %
Platelets: 176 10*3/uL (ref 150–400)
RBC: 4.83 MIL/uL (ref 4.22–5.81)
RDW: 12.4 % (ref 11.5–15.5)
WBC: 3.1 10*3/uL — ABNORMAL LOW (ref 4.0–10.5)
nRBC: 0 % (ref 0.0–0.2)

## 2021-01-25 LAB — COMPREHENSIVE METABOLIC PANEL
ALT: 44 U/L (ref 0–44)
AST: 63 U/L — ABNORMAL HIGH (ref 15–41)
Albumin: 4 g/dL (ref 3.5–5.0)
Alkaline Phosphatase: 80 U/L (ref 38–126)
Anion gap: 11 (ref 5–15)
BUN: 8 mg/dL (ref 6–20)
CO2: 24 mmol/L (ref 22–32)
Calcium: 8.9 mg/dL (ref 8.9–10.3)
Chloride: 103 mmol/L (ref 98–111)
Creatinine, Ser: 0.78 mg/dL (ref 0.61–1.24)
GFR, Estimated: >60 mL/min (ref >60)
Glucose, Bld: 138 mg/dL — ABNORMAL HIGH (ref 70–99)
Potassium: 3.8 mmol/L (ref 3.5–5.1)
Sodium: 138 mmol/L (ref 135–145)
Total Bilirubin: 0.6 mg/dL (ref 0.3–1.2)
Total Protein: 7.9 g/dL (ref 6.5–8.1)

## 2021-01-25 LAB — TECHNOLOGIST SMEAR REVIEW: Plt Morphology: NORMAL

## 2021-01-25 LAB — LACTATE DEHYDROGENASE: LDH: 134 U/L (ref 98–192)

## 2021-01-25 NOTE — Assessment & Plan Note (Addendum)
#  Leukopenia/neutropenia- absolute neutrophil count -nadir 500; normal hemoglobin/platelets. Patient is asymptomatic-no increased risk of infections.   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 rather than any malignant causes. Discussed possibility of alcohol induced induced; less likely any primary bone marrow disorder.  Would not recommend a bone marrow biopsy.    Recommend checking CBC CMP LDH; T-rev.   # Hepatitis- C [2013]- Untreated.refer to GI.    # Hx of alcohol induced acute pancreatitis- recommend cessation.   Thank you Dr. Kurtis Bushman. for allowing me to participate in the care of your pleasant patient. Please do not hesitate to contact me with questions or concerns in the interim.  # DISPOSITION: will call with results.  # referral to [facility] re: hepatitis C # labs- ordered # follow up TBD-Dr.B

## 2021-01-25 NOTE — Progress Notes (Signed)
one Bruce [name] NOTE  Patient Care Team: Patient, No Pcp Per as PCP - General (General Practice) Rockey Situ, Kathlene November, MD as PCP - Cardiology (Cardiology) Cammie Sickle, MD as Consulting Physician (Hematology and Oncology)  CHIEF COMPLAINTS/PURPOSE OF CONSULTATION: Leucopenia  # LEUCOPENIA/NEUTROPENIA- [place] nadir 0.5 [2013]; Hb; platelets; CT Ab/US; Hepatitis C POSITIVE [2013]/HIV-NEG [Feb 2022]; Alcohol abuse; POSITIVE for coacine  #Alcoholism/smoker/drug abuse Oncology History   No history exists.     HISTORY OF PRESENTING ILLNESS:  Bruce [name] 50 y.o.  male history of smoking; prior history of hepatitis C; illicit drug abuse alcohol abuse is here for further evaluation leucopenia.   Patient was recently admitted to hospital for acute pancreatitis alcohol abuse.  Patient was treated conservatively.  Patient noted to have neutropenia longstanding referred to [place] for further evaluation recommendations.  Frequent infections [pneumonias/sinus infection/UTI]- none Skin rash: none  Review of Systems  Constitutional: Negative for chills, diaphoresis, fever, malaise/fatigue and weight loss.  HENT: Negative for nosebleeds and sore throat.   Eyes: Negative for double vision.  Respiratory: Negative for cough, hemoptysis, sputum production, shortness of breath and wheezing.   Cardiovascular: Negative for chest pain, palpitations, orthopnea and leg swelling.  Gastrointestinal: Negative for abdominal pain, blood in stool, constipation, diarrhea, heartburn, melena, nausea and vomiting.  Genitourinary: Negative for dysuria, frequency and urgency.  Musculoskeletal: Positive for back pain. Negative for joint pain.  Skin: Negative.  Negative for itching and rash.  Neurological: Negative for dizziness, tingling, focal weakness, weakness and headaches.  Endo/Heme/Allergies: Does not bruise/bleed easily.  Psychiatric/Behavioral: Negative for depression. The patient is not  nervous/anxious and does not have insomnia.      MEDICAL HISTORY:  Past Medical History:  Diagnosis Date  . A-fib ([place])   . Dental caries   . Hepatitis C infection   . Iritis   . PAF (paroxysmal atrial fibrillation) ([place])    a. diagnosed 03/19/18; b. CHADS2VASc 0  . Polysubstance abuse ([place])    a. cocaine, tobacco, etoh  . Scrotal mass   . Tick borne fever   . Uveitis     SURGICAL HISTORY: Past Surgical History:  Procedure Laterality Date  . FINGER SURGERY     infection, middle left  . fractured jaw    . SHOULDER SURGERY     right    SOCIAL HISTORY: Social History   Socioeconomic History  . Marital status: Single    Spouse name: Not on file  . Number of children: 0  . Years of education: Not on file  . Highest education level: Not on file  Occupational History    Employer: UNEMPLOYE  Tobacco Use  . Smoking status: Current Some Day Smoker    Packs/day: 0.25    Types: Cigarettes  . Smokeless tobacco: Never Used  . Tobacco comment: Per pt  " I only smoke cigarettes when I drink beer"  Vaping Use  . Vaping Use: Never used  Substance and Sexual Activity  . Alcohol use: Yes    Comment: 6 pack per week  . Drug use: Yes    Types: IV, Cocaine    Comment: Pt states that he last used "a while back"  . Sexual activity: Not on file  Other Topics Concern  . Not on file  Social History Narrative   Lives in [place]; with fiance. 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; cleaning foreclosed houses;    Social Determinants of Health   Financial Resource Strain: Not on  file  Food Insecurity: Not on file  Transportation Needs: Not on file  Physical Activity: Not on file  Stress: Not on file  Social Connections: Not on file  Intimate Partner Violence: Not on file    FAMILY HISTORY: Family History  Problem Relation Age of Onset  . Drug abuse Father        throat cancer    ALLERGIES:  is allergic to penicillins.  MEDICATIONS:  Current Outpatient Medications   Medication Sig Dispense Refill  . diltiazem (CARDIZEM CD) 120 MG 24 hr capsule Take 1 capsule (120 mg total) by mouth daily. 30 capsule 0  . folic acid (FOLVITE) 1 MG tablet Take 1 tablet (1 mg total) by mouth daily. 30 tablet 0  . tamsulosin (FLOMAX) 0.4 MG CAPS capsule Take 1 capsule (0.4 mg total) by mouth daily. 30 capsule 0  . thiamine 100 MG tablet Take 1 tablet (100 mg total) by mouth daily. 30 tablet 0   No current facility-administered medications for this visit.      PHYSICAL EXAMINATION:  Vitals:   01/25/21 1413  BP: (!) 127/97  Pulse: 89  Resp: 18  Temp: 98 F (36.7 C)  SpO2: 100%   Filed Weights   01/25/21 1413  Weight: 162 lb 8 oz (73.7 kg)    Physical Exam Constitutional:      Comments: Accompanied by his fiance.  Ambulating independently.  HENT:     Head: Normocephalic and atraumatic.     Mouth/Throat:     Pharynx: No oropharyngeal exudate.  Eyes:     Pupils: Pupils are equal, round, and reactive to light.  Cardiovascular:     Rate and Rhythm: Normal rate and regular rhythm.  Pulmonary:     Effort: Pulmonary effort is normal. No respiratory distress.     Breath sounds: Normal breath sounds. No wheezing.  Abdominal:     General: Bowel sounds are normal. There is no distension.     Palpations: Abdomen is soft. There is no mass.     Tenderness: There is no abdominal tenderness. There is no guarding or rebound.     Comments: No rigidity or guarding.  Musculoskeletal:        General: No tenderness. Normal range of motion.     Cervical back: Normal range of motion and neck supple.  Skin:    General: Skin is warm.  Neurological:     Mental Status: He is alert and oriented to person, place, and time.  Psychiatric:        Mood and Affect: Affect normal.      LABORATORY DATA:  I have reviewed the data as listed Lab Results  Component Value Date   WBC 3.1 (L) 01/25/2021   HGB 15.5 01/25/2021   HCT 44.2 01/25/2021   MCV 91.5 01/25/2021   PLT  176 01/25/2021   Recent Labs    03/09/20 0954 03/10/20 0440 06/29/20 1156 06/30/20 0426 07/01/20 0545 08/28/20 0323 12/25/20 0416 12/26/20 0434 01/25/21 1457  NA 141   < > 140 135 136   < > 136 137 138  K 3.8   < > 4.2 3.6 3.4*   < > 3.4* 4.1 3.8  CL 105   < > 104 101 101   < > 101 101 103  CO2 21*   < > 22 25 26    < > 27 28 24   GLUCOSE 106*   < > 101* 131* 156*   < > 146* 169* 138*  BUN  8   < > 12 10 8    < > <5* 7 8  CREATININE 0.70   < > 0.90 0.76 0.78   < > 0.68 0.77 0.78  CALCIUM 9.5   < > 10.0 9.1 8.8*   < > 9.2 9.2 8.9  GFRNONAA >60   < > >60 >60 >60   < > >60 >60 >60  GFRAA >60   < > >60 >60 >60  --   --   --   --   PROT 8.7*   < > 9.2* 8.0 7.2   < > 7.3 7.3 7.9  ALBUMIN 4.3   < > 4.7 4.0 3.5   < > 3.5 3.4* 4.0  AST 129*   < > 63* 56* 51*   < > 77* 78* 63*  ALT 76*   < > 42 39 37   < > 55* 65* 44  ALKPHOS 104   < > 94 75 67   < > 63 68 80  BILITOT 2.1*   < > 1.5* 1.1 0.8   < > 0.7 0.4 0.6  BILIDIR 0.5*  --   --   --   --   --   --   --   --   IBILI 1.6*  --   --   --   --   --   --   --   --    < > = values in this interval not displayed.    RADIOGRAPHIC STUDIES: I have personally reviewed the radiological images as listed and agreed with the findings in the report. No results found.  ASSESSMENT & PLAN:   Neutropenia, unspecified (HCC) #Leukopenia/neutropenia- absolute neutrophil count -nadir 500; normal hemoglobin/platelets. Patient is asymptomatic-no increased risk of infections.   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 rather than any malignant causes. Discussed possibility of alcohol induced induced; less likely any primary bone marrow disorder.  Would not recommend a bone marrow biopsy.    Recommend checking CBC CMP LDH; T-rev.   # Hepatitis- C [2013]- Untreated.refer to GI.    # Hx of alcohol induced acute pancreatitis- recommend cessation.   Thank you Dr. Kurtis Bushman. for allowing me to participate in the care of your pleasant  patient. Please do not hesitate to contact me with questions or concerns in the interim.  # DISPOSITION: will call with results.  # referral to [facility] re: hepatitis C # labs- ordered # follow up TBD-Dr.B   All questions were answered. The patient knows to call the clinic with any problems, questions or concerns.    Cammie Sickle, MD 01/25/2021 5:21 PM

## 2021-01-26 ENCOUNTER — Telehealth: Payer: Self-pay | Admitting: Physician Assistant

## 2021-01-26 ENCOUNTER — Other Ambulatory Visit: Payer: Self-pay | Admitting: Physician Assistant

## 2021-01-26 NOTE — Telephone Encounter (Signed)
Note sent to triage RE: refills. Waiting for response on whether to refill or wait until appointment with Ryan in 3 days. Last seen 10/2018.

## 2021-01-26 NOTE — Telephone Encounter (Signed)
Last refilled by ER physician. Last seen in our office 10/2018 by [name]. Has upcoming appointment with Alycia Rossetti in 3 days. OK to refill or wait until apointment? Please advise.

## 2021-01-26 NOTE — Telephone Encounter (Signed)
*  STAT* If patient is at the pharmacy, call can be transferred to refill team.   1. Which medications need to be refilled? (please list name of each medication and dose if known)  diltiazem 120 mg po q d  2. Which pharmacy/location (including street and city if local pharmacy) is medication to be sent to?  [facility]   3. Do they need a 30 day or 90 day supply? 90

## 2021-01-27 ENCOUNTER — Ambulatory Visit: Payer: Self-pay | Admitting: Physician Assistant

## 2021-01-27 ENCOUNTER — Telehealth: Payer: Self-pay | Admitting: Internal Medicine

## 2021-01-27 MED ORDER — THIAMINE HCL 100 MG PO TABS: 100.0000 mg | ORAL_TABLET | Freq: Every day | ORAL | 0 refills | Status: AC

## 2021-01-27 MED ORDER — FOLIC ACID 1 MG PO TABS: 1.0000 mg | ORAL_TABLET | Freq: Every day | ORAL | 0 refills | Status: AC

## 2021-01-27 MED ORDER — TAMSULOSIN HCL 0.4 MG PO CAPS: 0.4000 mg | ORAL_CAPSULE | Freq: Every day | ORAL | 0 refills | Status: AC

## 2021-01-27 MED ORDER — DILTIAZEM HCL ER COATED BEADS 120 MG PO CP24: 120.0000 mg | ORAL_CAPSULE | Freq: Every day | ORAL | 0 refills | Status: DC

## 2021-01-27 NOTE — Progress Notes (Deleted)
Cardiology Office Note    Date:  01/27/2021   ID:  Bruce [name], DOB [birth year], MRN [id number]  PCP:  Patient, No Pcp Per  Cardiologist:  Ida Rogue, MD  Electrophysiologist:  None   Chief Complaint: Hospital follow up  History of Present Illness:   Bruce [name] is a 50 y.o. male with history of PAF, polysubstance abuse with ongoing cocaine, alcohol, and tobacco abuse, HTN, hepatitis C in the setting of IV drug abuse, and medication nonadherence who presents for hospital follow-up as outlined below.  We first met him in 03/2018 during hospital admission for newly diagnosed A. fib in the context of binge drinking, cocaine, and tobacco use.  Echo at that time showed an EF of 60 to 65%, no regional wall motion abnormalities, grade 2 diastolic dysfunction, mild mitral regurgitation, normal size left atrium, normal RV systolic function and ventricular cavity size, and PASP normal.  He converted to sinus rhythm with rate control.  He was not started on [place] at that time given CHA2DS2-VASc of 0.  He was seen in the ED in 09/2018 with recurrent A. fib with RVR in the setting of ongoing cocaine use.  He was started on flecainide and continued on Cardizem.  He converted in the ED.  He was not placed on [place].  Subsequent nuclear stress test in 10/2018 showed no evidence of ischemia and was overall low risk.  Outpatient cardiac monitoring in 10/2018 showed a predominant rhythm of sinus with an average heart rate of 85 bpm, 25 runs of SVT with the longest episode lasting 14 beats.  Patient triggered events were not associated with significant arrhythmia.  He was last seen in the office in 10/2018.  Since that time he has been seen in the ED or admitted to the hospital numerous times with varying complaints including atypical chest pain, ongoing cocaine use, and acute alcohol induced pancreatitis.  Echo during admission in 06/2019 for pancreatitis showed an EF of 55 to 60%, normal LV diastolic function  parameters, normal RV systolic function and ventricular cavity size, normal PASP, and no significant valvular abnormalities.  He was most recently admitted to the hospital from 2/14 through 2/19 with abdominal and chest pain and was noted to be in A. fib with RVR.  CT of the chest showed findings consistent with recurrent pancreatitis.  Urine drug screen was again positive for cocaine.  He was placed on a diltiazem infusion for rate control and subsequently converted to sinus rhythm.  Echo showed an EF of 55 to 60%, mild LVH, normal RV systolic function and ventricular cavity size, and trivial mitral regurgitation.  High-sensitivity troponin of 18.  It was felt his episode of recurrent A. fib was in the setting of medication noncompliance, polysubstance use, and pancreatitis.  It was recommended he be continued on diltiazem at discharge and in the setting of his CHA2DS2-VASc of 1 with history of noncompliance anticoagulation was deferred.  ***   Labs independently reviewed: 12/2020 -Hgb 14.8, PLT 198, magnesium 2.0, potassium 4.1, BUN 7, serum creatinine 0.77, albumin 3.4, AST 78, ALT 65, A1c 5.8, TSH 0.219, free T4 normal, TC 125, TG 57, HDL 66, LDL 48    Past Medical History:  Diagnosis Date  . A-fib ([place])   . Dental caries   . Hepatitis C infection   . Iritis   . PAF (paroxysmal atrial fibrillation) ([place])    a. diagnosed 03/19/18; b. CHADS2VASc 0  . Polysubstance abuse ([place])  a. cocaine, tobacco, etoh  . Scrotal mass   . Tick borne fever   . Uveitis     Past Surgical History:  Procedure Laterality Date  . FINGER SURGERY     infection, middle left  . fractured jaw    . SHOULDER SURGERY     right    Current Medications: No outpatient medications have been marked as taking for the 01/29/21 encounter (Appointment) with Rise Mu, PA-C.    Allergies:   Penicillins   Social History   Socioeconomic History  . Marital status: Single    Spouse name: Not on file  . Number of  children: 0  . Years of education: Not on file  . Highest education level: Not on file  Occupational History    Employer: UNEMPLOYE  Tobacco Use  . Smoking status: Current Some Day Smoker    Packs/day: 0.25    Types: Cigarettes  . Smokeless tobacco: Never Used  . Tobacco comment: Per pt  " I only smoke cigarettes when I drink beer"  Vaping Use  . Vaping Use: Never used  Substance and Sexual Activity  . Alcohol use: Yes    Comment: 6 pack per week  . Drug use: Yes    Types: IV, Cocaine    Comment: Pt states that he last used "a while back"  . Sexual activity: Not on file  Other Topics Concern  . Not on file  Social History Narrative   Lives in [place]; with fiance. 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  ROS:   ROS   EKGs/Labs/Other Studies Reviewed:    Studies reviewed were summarized above. The additional studies were reviewed today:  2D echo 12/2020: 1. Left ventricular ejection fraction, by estimation, is 55 to 60%. The  left ventricle has normal function. Left ventricular endocardial border  not optimally defined to evaluate regional wall motion. There is mild left  ventricular hypertrophy. Left  ventricular diastolic parameters are indeterminate.  2. Right ventricular systolic function is normal. The right ventricular  size is normal. Tricuspid regurgitation signal is inadequate for assessing  PA pressure.  3. The mitral valve is normal in structure. Trivial mitral valve  regurgitation. No evidence of mitral stenosis.  4. The aortic valve has an indeterminant number of cusps. Aortic valve  regurgitation is not  visualized. No aortic stenosis is present. __________  2D echo 06/2019: 1. The left ventricle has normal systolic function, with an ejection  fraction of 55-60%. The cavity size was normal. Left ventricular diastolic  parameters were normal.  2. The right ventricle has normal systolic function. The cavity was  normal. There is no increase in right ventricular wall thickness. Right  ventricular systolic pressure is normal with an estimated pressure of 20.6  mmHg.  3. No evidence of mitral valve stenosis.  4. The tricuspid valve is grossly normal.  5. The aortic valve is grossly normal. No stenosis of the aortic valve.  6. The aorta is normal unless otherwise noted. __________  Nuclear stress test 10/2018:  T wave inversion was noted during stress in the III leads.  There  was no ST segment deviation noted during stress.  The study is normal.  This is a low risk study.  Nuclear stress EF: 50%.  __________  [place] Reach patch 10/2018: Normal sinus rhythm Avg HR of 85 bpm.  25 Supraventricular Tachycardia runs occurred, the run with the fastest interval lasting 5 beats with a max rate of 207 bpm, the longest lasting 14 beats with an avg rate of 156 bpm.   Isolated SVEs were occasional (1.1%, 15308), SVE Couplets were rare (<1.0%, 137), and SVE Triplets were rare (<1.0%, 42). Isolated VEs were rare (<1.0%), VE Couplets were rare (<1.0%), and no VE Triplets were present. Ventricular Bigeminy was present.  Patient triggered events were not associated with significant arrhythmia __________  2D echo 03/2018: - Left ventricle: The cavity size was normal. Systolic function was  normal. The estimated ejection fraction was in the range of 60%  to 65%. Wall motion was normal; there were no regional wall  motion abnormalities. Features are consistent with a pseudonormal  left ventricular filling pattern, with concomitant abnormal  relaxation and increased filling pressure  (grade 2 diastolic  dysfunction).  - Mitral valve: There was mild regurgitation.  - Left atrium: The atrium was normal in size.  - Right ventricle: Systolic function was normal.  - Pulmonary arteries: Systolic pressure was within the normal  range.    EKG:  EKG is ordered today.  The EKG ordered today demonstrates ***  Recent Labs: 12/21/2020: TSH 0.219 12/26/2020: Magnesium 2.0 01/25/2021: ALT 44; BUN 8; Creatinine, Ser 0.78; Hemoglobin 15.5; Platelets 176; Potassium 3.8; Sodium 138  Recent Lipid Panel    Component Value Date/Time   CHOL 125 12/22/2020 0506   TRIG 57 12/22/2020 0506   HDL 66 12/22/2020 0506   CHOLHDL 1.9 12/22/2020 0506   VLDL 11 12/22/2020 0506   LDLCALC 48 12/22/2020 0506    PHYSICAL EXAM:    VS:  There were no vitals taken for this visit.  BMI: There is no height or weight on file to calculate BMI.  Physical Exam  Wt Readings from Last 3 Encounters:  01/25/21 162 lb 8 oz (73.7 kg)  12/23/20 161 lb 9.6 oz (73.3 kg)  09/28/20 165 lb (74.8 kg)     ASSESSMENT & PLAN:   1. ***  Disposition: F/u with Dr. Rockey Situ or an APP in ***.   Medication Adjustments/Labs and Tests Ordered: Current medicines are reviewed at length with the patient today.  Concerns regarding medicines are outlined above. Medication changes, Labs and Tests ordered today are summarized above and listed in the Patient Instructions accessible in Encounters.   Signed, Christell Faith, PA-C 01/27/2021 8:45 AM     [facility] [address] [place], [place] [address] [phone]

## 2021-01-27 NOTE — Telephone Encounter (Signed)
On 3/23-I left a voicemail for the patient the results of the blood work showing chronic mild neutropenia ANC 0.9 asymptomatic-I would not recommend any further work-up at this time.  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  Mildly elevated AST ALT likely secondary to alcohol induced hepatitis/history of hep C..  Recommend discontinuation of alcohol.  Patient will follow up with PCP/follow-up with [place] only as needed.

## 2021-01-27 NOTE — Telephone Encounter (Signed)
Ok to refill as prescribed at hospital discharge x 1. He will need to establish with a PCP for further management/refills.

## 2021-01-27 NOTE — Telephone Encounter (Signed)
Refills sent

## 2021-01-27 NOTE — Telephone Encounter (Signed)
Spoke to patient's DPR.  States he was in the hospital about 1 month ago and ran out of his prescriptions yesterday. He has appointment with Alycia Rossetti on Friday, 01/29/21. Advised I will go ahead and send in the diltiazem.  Patient does not have a PCP. I will ask if ok to send in the folic acid, flomax and thiamine at this time.

## 2021-01-29 ENCOUNTER — Ambulatory Visit: Payer: Self-pay | Admitting: Physician Assistant

## 2021-02-05 NOTE — Progress Notes (Deleted)
Cardiology Office Note    Date:  02/05/2021   ID:  Bruce [name], DOB 02-Oct-1971, MRN [id number]  PCP:  Patient, No Pcp Per (Inactive)  Cardiologist:  Ida Rogue, MD  Electrophysiologist:  None   Chief Complaint: Hospital follow-up  History of Present Illness:   Bruce [name] is a 50 y.o. male with history of PAF, polysubstance abuse with ongoing cocaine, alcohol, and tobacco abuse, HTN, hepatitis C in the setting of IV drug abuse, and medication nonadherence who presents for hospital follow-up as outlined below.  We first met him in 03/2018 during hospital admission for newly diagnosed A. fib in the context of binge drinking, cocaine, and tobacco use.  Echo at that time showed an EF of 60 to 65%, no regional wall motion abnormalities, grade 2 diastolic dysfunction, mild mitral regurgitation, normal size left atrium, normal RV systolic function and ventricular cavity size, and PASP normal.  He converted to sinus rhythm with rate control.  He was not started on [place] at that time given CHA2DS2-VASc of 0.  He was seen in the ED in 09/2018 with recurrent A. fib with RVR in the setting of ongoing cocaine use.  He was started on flecainide and continued on Cardizem.  He converted in the ED.  He was not placed on [place].  Subsequent nuclear stress test in 10/2018 showed no evidence of ischemia and was overall low risk.  Outpatient cardiac monitoring in 10/2018 showed a predominant rhythm of sinus with an average heart rate of 85 bpm, 25 runs of SVT with the longest episode lasting 14 beats.  Patient triggered events were not associated with significant arrhythmia.  He was last seen in the office in 10/2018.  Since that time he has been seen in the ED or admitted to the hospital numerous times with varying complaints including atypical chest pain, ongoing cocaine use, and acute alcohol induced pancreatitis.  Echo during admission in 06/2019 for pancreatitis showed an EF of 55 to 60%, normal LV  diastolic function parameters, normal RV systolic function and ventricular cavity size, normal PASP, and no significant valvular abnormalities.  He was most recently admitted to the hospital from 2/14 through 2/19 with abdominal and chest pain and was noted to be in A. fib with RVR.  CT of the chest showed findings consistent with recurrent pancreatitis.  Urine drug screen was again positive for cocaine.  He was placed on a diltiazem infusion for rate control and subsequently converted to sinus rhythm.  Echo showed an EF of 55 to 60%, mild LVH, normal RV systolic function and ventricular cavity size, and trivial mitral regurgitation.  High-sensitivity troponin of 18.  It was felt his episode of recurrent A. fib was in the setting of medication noncompliance, polysubstance use, and pancreatitis.  It was recommended he be continued on diltiazem at discharge and in the setting of his CHA2DS2-VASc of 1 with history of noncompliance anticoagulation was deferred.  ***   Labs independently reviewed: 12/2020 -Hgb 14.8, PLT 198, magnesium 2.0, potassium 4.1, BUN 7, serum creatinine 0.77, albumin 3.4, AST 78, ALT 65, A1c 5.8, TSH 0.219, free T4 normal, TC 125, TG 57, HDL 66, LDL 48   Past Medical History:  Diagnosis Date  . A-fib ([place])   . Dental caries   . Hepatitis C infection   . Iritis   . PAF (paroxysmal atrial fibrillation) ([place])    a. diagnosed 03/19/18; b. CHADS2VASc 0  . Polysubstance abuse ([place])  a. cocaine, tobacco, etoh  . Scrotal mass   . Tick borne fever   . Uveitis     Past Surgical History:  Procedure Laterality Date  . FINGER SURGERY     infection, middle left  . fractured jaw    . SHOULDER SURGERY     right    Current Medications: No outpatient medications have been marked as taking for the 02/10/21 encounter (Appointment) with Rise Mu, PA-C.    Allergies:   Penicillins   Social History   Socioeconomic History  . Marital status: Single    Spouse name: Not on file   . Number of children: 0  . Years of education: Not on file  . Highest education level: Not on file  Occupational History    Employer: UNEMPLOYE  Tobacco Use  . Smoking status: Current Some Day Smoker    Packs/day: 0.25    Types: Cigarettes  . Smokeless tobacco: Never Used  . Tobacco comment: Per pt  " I only smoke cigarettes when I drink beer"  Vaping Use  . Vaping Use: Never used  Substance and Sexual Activity  . Alcohol use: Yes    Comment: 6 pack per week  . Drug use: Yes    Types: IV, Cocaine    Comment: Pt states that he last used "a while back"  . Sexual activity: Not on file  Other Topics Concern  . Not on file  Social History Narrative   Lives in [place]; with fiance. 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  ROS:   ROS   EKGs/Labs/Other Studies Reviewed:    Studies reviewed were summarized above. The additional studies were reviewed today:  2D echo 12/2020: 1. Left ventricular ejection fraction, by estimation, is 55 to 60%. The  left ventricle has normal function. Left ventricular endocardial border  not optimally defined to evaluate regional wall motion. There is mild left  ventricular hypertrophy. Left  ventricular diastolic parameters are indeterminate.  2. Right ventricular systolic function is normal. The right ventricular  size is normal. Tricuspid regurgitation signal is inadequate for assessing  PA pressure.  3. The mitral valve is normal in structure. Trivial mitral valve  regurgitation. No evidence of mitral stenosis.  4. The aortic valve has an indeterminant number of cusps. Aortic valve  regurgitation  is not visualized. No aortic stenosis is present. __________  2D echo 06/2019: 1. The left ventricle has normal systolic function, with an ejection  fraction of 55-60%. The cavity size was normal. Left ventricular diastolic  parameters were normal.  2. The right ventricle has normal systolic function. The cavity was  normal. There is no increase in right ventricular wall thickness. Right  ventricular systolic pressure is normal with an estimated pressure of 20.6  mmHg.  3. No evidence of mitral valve stenosis.  4. The tricuspid valve is grossly normal.  5. The aortic valve is grossly normal. No stenosis of the aortic valve.  6. The aorta is normal unless otherwise noted. __________  Nuclear stress test 10/2018:  T wave inversion was noted during stress in the III leads.  There  was no ST segment deviation noted during stress.  The study is normal.  This is a low risk study.  Nuclear stress EF: 50%.  __________  [facility] Reach patch 10/2018: Normal sinus rhythm Avg HR of 85 bpm.  25 Supraventricular Tachycardia runs occurred, the run with the fastest interval lasting 5 beats with a max rate of 207 bpm, the longest lasting 14 beats with an avg rate of 156 bpm.   Isolated SVEs were occasional (1.1%, 15308), SVE Couplets were rare (<1.0%, 137), and SVE Triplets were rare (<1.0%, 42). Isolated VEs were rare (<1.0%), VE Couplets were rare (<1.0%), and no VE Triplets were present. Ventricular Bigeminy was present.  Patient triggered events were not associated with significant arrhythmia __________  2D echo 03/2018: - Left ventricle: The cavity size was normal. Systolic function was  normal. The estimated ejection fraction was in the range of 60%  to 65%. Wall motion was normal; there were no regional wall  motion abnormalities. Features are consistent with a pseudonormal  left ventricular filling pattern, with concomitant abnormal  relaxation and increased filling  pressure (grade 2 diastolic  dysfunction).  - Mitral valve: There was mild regurgitation.  - Left atrium: The atrium was normal in size.  - Right ventricle: Systolic function was normal.  - Pulmonary arteries: Systolic pressure was within the normal  range.    EKG:  EKG is ordered today.  The EKG ordered today demonstrates ***  Recent Labs: 12/21/2020: TSH 0.219 12/26/2020: Magnesium 2.0 01/25/2021: ALT 44; BUN 8; Creatinine, Ser 0.78; Hemoglobin 15.5; Platelets 176; Potassium 3.8; Sodium 138  Recent Lipid Panel    Component Value Date/Time   CHOL 125 12/22/2020 0506   TRIG 57 12/22/2020 0506   HDL 66 12/22/2020 0506   CHOLHDL 1.9 12/22/2020 0506   VLDL 11 12/22/2020 0506   LDLCALC 48 12/22/2020 0506    PHYSICAL EXAM:    VS:  There were no vitals taken for this visit.  BMI: There is no height or weight on file to calculate BMI.  Physical Exam  Wt Readings from Last 3 Encounters:  01/25/21 162 lb 8 oz (73.7 kg)  12/23/20 161 lb 9.6 oz (73.3 kg)  09/28/20 165 lb (74.8 kg)     ASSESSMENT & PLAN:   1. PAF: ***.  CHA2DS2-VASc at least 1.  2. HTN: Blood pressure ***  3. Polysubstance abuse:  4. Medication nonadherence:  Disposition: F/u with Dr. Rockey Situ or an APP in ***.   Medication Adjustments/Labs and Tests Ordered: Current medicines are reviewed at length with the patient today.  Concerns regarding medicines are outlined above. Medication changes, Labs and Tests ordered today are summarized above and listed in the Patient Instructions accessible in Encounters.   Signed, Christell Faith, PA-C 02/05/2021 2:59 PM     [facility] [place] [address] [place], [place] [address] [phone]

## 2021-02-10 ENCOUNTER — Ambulatory Visit: Payer: Self-pay | Admitting: Physician Assistant

## 2021-02-17 NOTE — Progress Notes (Signed)
Cardiology Office Note    Date:  02/19/2021   ID:  Bruce [name], DOB 09/20/1971, MRN [id number]  PCP:  Patient, No Pcp Per (Inactive)  Cardiologist:  Ida Rogue, MD  Electrophysiologist:  None   Chief Complaint: Hospital follow up  History of Present Illness:   Bruce [name] is a 50 y.o. male with history of PAF, polysubstance abuse with ongoing cocaine, alcohol, and tobacco abuse, HTN, hepatitis C in the setting of IV drug abuse, and medication nonadherence who presents for hospital follow-up as outlined below.  Our group first met him in 03/2018 during hospital admission for newly diagnosed A. fib in the context of binge drinking, cocaine, and tobacco use.  Echo at that time showed an EF of 60 to 65%, no regional wall motion abnormalities, grade 2 diastolic dysfunction, mild mitral regurgitation, normal size left atrium, normal RV systolic function and ventricular cavity size, and PASP normal.  He converted to sinus rhythm with rate control.  He was not started on [place] at that time given CHA2DS2-VASc of 0.  He was seen in the ED in 09/2018 with recurrent A. fib with RVR in the setting of ongoing cocaine use.  He was started on flecainide and continued on Cardizem.  He converted in the ED.  He was not placed on [place].  Subsequent nuclear stress test in 10/2018 showed no evidence of ischemia and was overall low risk.  Outpatient cardiac monitoring in 10/2018 showed a predominant rhythm of sinus with an average heart rate of 85 bpm, 25 runs of SVT with the longest episode lasting 14 beats.  Patient triggered events were not associated with significant arrhythmia.  He was last seen in the office in 10/2018.  Since that time, he has been seen in the ED or admitted to the hospital numerous times with varying complaints including atypical chest pain, ongoing cocaine use, and acute alcohol-induced pancreatitis.  Echo, during admission in 06/2019 for pancreatitis, showed an EF of 55 to 60%, normal  LV diastolic function parameters, normal RV systolic function and ventricular cavity size, normal PASP, and no significant valvular abnormalities.  He was most recently admitted to the hospital from 12/21/20 through 12/26/20 with abdominal and chest pain and was noted to be in A. fib with RVR.  CT of the chest showed findings consistent with recurrent pancreatitis.  Urine drug screen was again positive for cocaine.  He was placed on a diltiazem infusion for rate control and subsequently converted to sinus rhythm.  Echo showed an EF of 55 to 60%, mild LVH, normal RV systolic function and ventricular cavity size, and trivial mitral regurgitation.  High-sensitivity troponin of 18.  It was felt his episode of recurrent A. fib was in the setting of medication noncompliance, polysubstance use, and pancreatitis.  It was recommended he be continued on diltiazem at discharge and in the setting of his CHA2DS2-VASc of 1 with history of noncompliance anticoagulation was deferred.  He comes in doing reasonably well from a cardiac perspective.  On 4/14 he did note some left lateral chest discomfort that improved with elevating and rotating his left arm behind his back.  No exertional or associated symptoms.  Symptoms lasted for the better part of the day and ultimately improved with Tylenol.  Pain was described as sharp.  No symptoms consistent with prior documented A. fib.  He reports adherence and tolerance of diltiazem.  Indicates he last used cocaine approximately 1 month ago.  He does continue to  drink 4-6 beers daily.  He indicates he "only smokes when he drinks."  Currently chest pain-free.   Labs independently reviewed: 12/2020 - Hgb 14.8, PLT 198, magnesium 2.0, potassium 4.1, BUN 7, serum creatinine 0.77, albumin 3.4, AST 78, ALT 65, A1c 5.8, TSH 0.219, free T4 normal, TC 125, TG 57, HDL 66, LDL 48   Past Medical History:  Diagnosis Date  . A-fib ([place])   . Dental caries   . Hepatitis C infection   . Iritis    . PAF (paroxysmal atrial fibrillation) ([place])    a. diagnosed 03/19/18; b. CHADS2VASc 0  . Polysubstance abuse ([place])    a. cocaine, tobacco, etoh  . Scrotal mass   . Tick borne fever   . Uveitis     Past Surgical History:  Procedure Laterality Date  . FINGER SURGERY     infection, middle left  . fractured jaw    . SHOULDER SURGERY     right    Current Medications: Current Meds  Medication Sig  . folic acid (FOLVITE) 1 MG tablet Take 1 tablet (1 mg total) by mouth daily. Will need to establish with a Primary Care for further refills.  . tamsulosin (FLOMAX) 0.4 MG CAPS capsule Take 1 capsule (0.4 mg total) by mouth daily. Will need to establish with a Primary Care for further refills.  . thiamine 100 MG tablet Take 1 tablet (100 mg total) by mouth daily. Will need to establish with a Primary Care for further refills.  . [DISCONTINUED] diltiazem (CARDIZEM CD) 120 MG 24 hr capsule Take 1 capsule (120 mg total) by mouth daily.    Allergies:   Penicillins   Social History   Socioeconomic History  . Marital status: Single    Spouse name: Not on file  . Number of children: 0  . Years of education: Not on file  . Highest education level: Not on file  Occupational History    Employer: UNEMPLOYE  Tobacco Use  . Smoking status: Current Some Day Smoker    Packs/day: 0.25    Types: Cigarettes  . Smokeless tobacco: Never Used  . Tobacco comment: Per pt  " I only smoke cigarettes when I drink beer"  Vaping Use  . Vaping Use: Never used  Substance and Sexual Activity  . Alcohol use: Yes    Comment: 6 pack per week  . Drug use: Yes    Types: IV, Cocaine    Comment: Pt states that he last used "a while back"  . Sexual activity: Not on file  Other Topics Concern  . Not on file  Social History Narrative   Lives in [place]; with fiance. 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; cleaning foreclosed houses;    Social Determinants of Health   Financial Resource Strain: Not on file   Food Insecurity: Not on file  Transportation Needs: Not on file  Physical Activity: Not on file  Stress: Not on file  Social Connections: Not on file     Family History:  The patient's family history includes Drug abuse in his father.  ROS:   Review of Systems  Constitutional: Negative for chills, diaphoresis, fever, malaise/fatigue and weight loss.  HENT: Negative for congestion.   Eyes: Negative for discharge and redness.  Respiratory: Negative for cough, sputum production, shortness of breath and wheezing.   Cardiovascular: Positive for chest pain. Negative for palpitations, orthopnea, claudication, leg swelling and PND.  Gastrointestinal: Negative for abdominal pain, heartburn, nausea and vomiting.  Musculoskeletal: Negative for falls and myalgias.  Skin: Negative for rash.  Neurological: Negative for dizziness, tingling, tremors, sensory change, speech change, focal weakness, loss of consciousness and weakness.  Endo/Heme/Allergies: Does not bruise/bleed easily.  Psychiatric/Behavioral: Positive for substance abuse. The patient is not nervous/anxious.   All other systems reviewed and are negative.    EKGs/Labs/Other Studies Reviewed:    Studies reviewed were summarized above. The additional studies were reviewed today:  2D echo 12/2020: 1. Left ventricular ejection fraction, by estimation, is 55 to 60%. The  left ventricle has normal function. Left ventricular endocardial border  not optimally defined to evaluate regional wall motion. There is mild left  ventricular hypertrophy. Left  ventricular diastolic parameters are indeterminate.  2. Right ventricular systolic function is normal. The right ventricular  size is normal. Tricuspid regurgitation signal is inadequate for assessing  PA pressure.  3. The mitral valve is normal in structure. Trivial mitral valve  regurgitation. No evidence of mitral stenosis.  4. The aortic valve has an indeterminant number of cusps.  Aortic valve  regurgitation is not visualized. No aortic stenosis is present. __________  2D echo 06/2019: 1. The left ventricle has normal systolic function, with an ejection  fraction of 55-60%. The cavity size was normal. Left ventricular diastolic  parameters were normal.  2. The right ventricle has normal systolic function. The cavity was  normal. There is no increase in right ventricular wall thickness. Right  ventricular systolic pressure is normal with an estimated pressure of 20.6  mmHg.  3. No evidence of mitral valve stenosis.  4. The tricuspid valve is grossly normal.  5. The aortic valve is grossly normal. No stenosis of the aortic valve.  6. The aorta is normal unless otherwise noted. __________  Nuclear stress test 10/2018:  T wave inversion was noted during stress in the III leads.  There was no ST segment deviation noted during stress.  The study is normal.  This is a low risk study.  Nuclear stress EF: 50%.  __________  Elwyn Reach patch 10/2018: Normal sinus rhythm Avg HR of 85 bpm.  25 Supraventricular Tachycardia runs occurred, the run with the fastest interval lasting 5 beats with a max rate of 207 bpm, the longest lasting 14 beats with an avg rate of 156 bpm.   Isolated SVEs were occasional (1.1%, 15308), SVE Couplets were rare (<1.0%, 137), and SVE Triplets were rare (<1.0%, 42). Isolated VEs were rare (<1.0%), VE Couplets were rare (<1.0%), and no VE Triplets were present. Ventricular Bigeminy was present.  Patient triggered events were not associated with significant arrhythmia __________  2D echo 03/2018: - Left ventricle: The cavity size was normal. Systolic function was  normal. The estimated ejection fraction was in the range of 60%  to 65%. Wall motion was normal; there were no regional wall  motion abnormalities. Features are consistent with a pseudonormal  left ventricular filling pattern, with concomitant abnormal   relaxation and increased filling pressure (grade 2 diastolic  dysfunction).  - Mitral valve: There was mild regurgitation.  - Left atrium: The atrium was normal in size.  - Right ventricle: Systolic function was normal.  - Pulmonary arteries: Systolic pressure was within the normal  range.   EKG:  EKG is ordered today.  The EKG ordered today demonstrates NSR, 68 bpm, no acute ST-T changes  Recent Labs: 12/21/2020: TSH 0.219 12/26/2020: Magnesium 2.0 01/25/2021: ALT 44; BUN 8; Creatinine, Ser 0.78; Hemoglobin 15.5; Platelets 176; Potassium 3.8; Sodium 138  Recent  Lipid Panel    Component Value Date/Time   CHOL 125 12/22/2020 0506   TRIG 57 12/22/2020 0506   HDL 66 12/22/2020 0506   CHOLHDL 1.9 12/22/2020 0506   VLDL 11 12/22/2020 0506   LDLCALC 48 12/22/2020 0506    PHYSICAL EXAM:    VS:  BP 120/84 (BP Location: Left Arm, Patient Position: Sitting, Cuff Size: Normal)   Pulse 68   Ht 5' 7"  (1.702 m)   Wt 165 lb 2 oz (74.9 kg)   SpO2 98%   BMI 25.86 kg/m   BMI: Body mass index is 25.86 kg/m.  Physical Exam Vitals reviewed.  Constitutional:      Appearance: He is well-developed.  HENT:     Head: Normocephalic and atraumatic.  Eyes:     General:        Right eye: No discharge.        Left eye: No discharge.  Neck:     Vascular: No JVD.  Cardiovascular:     Rate and Rhythm: Normal rate and regular rhythm.     Pulses: No midsystolic click and no opening snap.          Posterior tibial pulses are 2+ on the right side and 2+ on the left side.     Heart sounds: Normal heart sounds, S1 normal and S2 normal. Heart sounds not distant. No murmur heard. No friction rub.  Pulmonary:     Effort: Pulmonary effort is normal. No respiratory distress.     Breath sounds: Normal breath sounds. No decreased breath sounds, wheezing or rales.  Chest:     Chest wall: No tenderness.  Abdominal:     General: There is no distension.     Palpations: Abdomen is soft.      Tenderness: There is no abdominal tenderness.  Musculoskeletal:     Cervical back: Normal range of motion.  Skin:    General: Skin is warm and dry.     Nails: There is no clubbing.  Neurological:     Mental Status: He is alert and oriented to person, place, and time.  Psychiatric:        Speech: Speech normal.        Behavior: Behavior normal.        Thought Content: Thought content normal.        Judgment: Judgment normal.     Wt Readings from Last 3 Encounters:  02/19/21 165 lb 2 oz (74.9 kg)  01/25/21 162 lb 8 oz (73.7 kg)  12/23/20 161 lb 9.6 oz (73.3 kg)     ASSESSMENT & PLAN:   1. PAF: Maintaining sinus rhythm.  Continue Cardizem CD 120 mg daily.  Continue rate control with Cardizem CD 120 mg daily.  Given a CHA2DS2-VASc of 1, and in the context of recurrent polysubstance abuse, long-term [place] has been deferred.  2. Chest pain with moderate risk for cardiac etiology: Symptoms are overall atypical though with history of cocaine and tobacco use we will schedule a Lexiscan MPI to evaluate for high risk ischemia.  If this is low risk no further cardiac testing would be indicated and he would be able to follow-up with his PCP for atypical chest pain.  3. HTN: Blood pressure is well controlled in the office today.  4. Polysubstance abuse: Complete cessation is recommended  5. Medication nonadherence: Medication compliance is recommended.  Disposition: F/u with Dr. Rockey Situ or an APP in 1 month.   Medication Adjustments/Labs and Tests Ordered: Current medicines  are reviewed at length with the patient today.  Concerns regarding medicines are outlined above. Medication changes, Labs and Tests ordered today are summarized above and listed in the Patient Instructions accessible in Encounters.   Signed, Christell Faith, PA-C 02/19/2021 9:10 AM     [address] [place], [place] [address] [phone]

## 2021-02-19 ENCOUNTER — Encounter: Payer: Self-pay | Admitting: Physician Assistant

## 2021-02-19 ENCOUNTER — Other Ambulatory Visit: Payer: Self-pay

## 2021-02-19 ENCOUNTER — Ambulatory Visit (INDEPENDENT_AMBULATORY_CARE_PROVIDER_SITE_OTHER): Payer: Self-pay | Admitting: Physician Assistant

## 2021-02-19 VITALS — BP 120/84 | HR 68 | Ht 67.0 in | Wt 165.1 lb

## 2021-02-19 DIAGNOSIS — E782 Mixed hyperlipidemia: Secondary | ICD-10-CM

## 2021-02-19 DIAGNOSIS — F1910 Other psychoactive substance abuse, uncomplicated: Secondary | ICD-10-CM

## 2021-02-19 DIAGNOSIS — I10 Essential (primary) hypertension: Secondary | ICD-10-CM

## 2021-02-19 DIAGNOSIS — R079 Chest pain, unspecified: Secondary | ICD-10-CM

## 2021-02-19 DIAGNOSIS — I480 Paroxysmal atrial fibrillation: Secondary | ICD-10-CM

## 2021-02-19 MED ORDER — DILTIAZEM HCL ER COATED BEADS 120 MG PO CP24
120.0000 mg | ORAL_CAPSULE | Freq: Every day | ORAL | 0 refills | Status: DC
Start: 2021-02-19 — End: 2021-06-09

## 2021-02-19 NOTE — Patient Instructions (Signed)
Medication Instructions:  - Your physician recommends that you continue on your current medications as directed. Please refer to the Current Medication list given to you today.  *If you need a refill on your cardiac medications before your next appointment, please call your pharmacy*   Lab Work: - none ordered  If you have labs (blood work) drawn today and your tests are completely normal, you will receive your results only by: Marland Kitchen MyChart Message (if you have MyChart) OR . A paper copy in the mail If you have any lab test that is abnormal or we need to change your treatment, we will call you to review the results.   Testing/Procedures:  1) Lexiscan Myoview (Chemical Stress Test)  - Your physician has requested that you have a lexiscan myoview.   [facility] MYOVIEW  Your caregiver has ordered a Stress Test with nuclear imaging. The purpose of this test is to evaluate the blood supply to your heart muscle. This procedure is referred to as a "Non-Invasive Stress Test." This is because other than having an IV started in your vein, nothing is inserted or "invades" your body. Cardiac stress tests are done to find areas of poor blood flow to the heart by determining the extent of coronary artery disease (CAD). Some patients exercise on a treadmill, which naturally increases the blood flow to your heart, while others who are  unable to walk on a treadmill due to physical limitations have a pharmacologic/chemical stress agent called Lexiscan . This medicine will mimic walking on a treadmill by temporarily increasing your coronary blood flow.   Please note: these test may take anywhere between 2-4 hours to complete  PLEASE REPORT TO [facility] MEDICAL MALL ENTRANCE  THE VOLUNTEERS AT THE FIRST DESK WILL DIRECT YOU WHERE TO GO  Date of Procedure:_____________________________________  Arrival Time for Procedure:______________________________  Instructions regarding medication:   _x___ : You may take all  of your regular medications the morning of your test with enough water to get them down safely   PLEASE NOTIFY THE OFFICE AT LEAST 24 HOURS IN ADVANCE IF YOU ARE UNABLE TO KEEP YOUR APPOINTMENT.  [phone] AND  PLEASE NOTIFY NUCLEAR MEDICINE AT [facility] AT LEAST 24 HOURS IN ADVANCE IF YOU ARE UNABLE TO KEEP YOUR APPOINTMENT. [phone]  How to prepare for your Myoview test:  1. Do not eat or drink after midnight 2. No caffeine for 24 hours prior to test 3. No smoking 24 hours prior to test. 4. Your medication may be taken with water.  If your doctor stopped a medication because of this test, do not take that medication. 5. Ladies, please do not wear dresses.  Skirts or pants are appropriate. Please wear a short sleeve shirt. 6. No perfume, cologne or lotion. 7. Wear comfortable walking shoes. No heels!   Follow-Up: At [facility], you and your health needs are our priority.  As part of our continuing mission to provide you with exceptional heart care, we have created designated Provider Care Teams.  These Care Teams include your primary Cardiologist (physician) and Advanced Practice Providers (APPs -  Physician Assistants and Nurse Practitioners) who all work together to provide you with the care you need, when you need it.  We recommend signing up for the patient portal called "MyChart".  Sign up information is provided on this After Visit Summary.  MyChart is used to connect with patients for Virtual Visits (Telemedicine).  Patients are able to view lab/test results, encounter notes, upcoming appointments, etc.  Non-urgent messages can be sent to your provider as well.   To learn more about what you can do with MyChart, go to [email or web address].    Your next appointment:   1 month(s)  The format for your next appointment:   In Person  Provider:   You may see Julien Nordmann, MD or one of the following Advanced Practice Providers on your designated Care Team:    Nicolasa Ducking, NP  [name], PA-C  Marisue Ivan, PA-C  Cadence Fransico Michael, [place]  Gillian Shields, NP    Other Instructions  Cardiac Nuclear Scan A cardiac nuclear scan is a test that is done to check the flow of blood to your heart. It is done when you are resting and when you are exercising. The test looks for problems such as:  Not enough blood reaching a portion of the heart.  The heart muscle not working as it should. You may need this test if:  You have heart disease.  You have had lab results that are not normal.  You have had heart surgery or a balloon procedure to open up blocked arteries (angioplasty).  You have chest pain.  You have shortness of breath. In this test, a special dye (tracer) is put into your bloodstream. The tracer will travel to your heart. A camera will then take pictures of your heart to see how the tracer moves through your heart. This test is usually done at a hospital and takes 2-4 hours. Tell a doctor about:  Any allergies you have.  All medicines you are taking, including vitamins, herbs, eye drops, creams, and over-the-counter medicines.  Any problems you or family members have had with anesthetic medicines.  Any blood disorders you have.  Any surgeries you have had.  Any medical conditions you have.  Whether you are pregnant or may be pregnant. What are the risks? Generally, this is a safe test. However, problems may occur, such as:  Serious chest pain and heart attack. This is only a risk if the stress portion of the test is done.  Rapid heartbeat.  A feeling of warmth in your chest. This feeling usually does not last long.  Allergic reaction to the tracer. What happens before the test?  Ask your doctor about changing or stopping your normal medicines. This is important.  Follow instructions from your doctor about what you cannot eat or drink.  Remove your jewelry on the day of the test. What happens during the test?  An IV  tube will be inserted into one of your veins.  Your doctor will give you a small amount of tracer through the IV tube.  You will wait for 20-40 minutes while the tracer moves through your bloodstream.  Your heart will be monitored with an electrocardiogram (ECG).  You will lie down on an exam table.  Pictures of your heart will be taken for about 15-20 minutes.  You may also have a stress test. For this test, one of these things may be done: ? You will be asked to exercise on a treadmill or a stationary bike. ? You will be given medicines that will make your heart work harder. This is done if you are unable to exercise.  When blood flow to your heart has peaked, a tracer will again be given through the IV tube.  After 20-40 minutes, you will get back on the exam table. More pictures will be taken of your heart.  Depending on the tracer that is used,  more pictures may need to be taken 3-4 hours later.  Your IV tube will be removed when the test is over. The test may vary among doctors and hospitals. What happens after the test?  Ask your doctor: ? Whether you can return to your normal schedule, including diet, activities, and medicines. ? Whether you should drink more fluids. This will help to remove the tracer from your body. Drink enough fluid to keep your pee (urine) pale yellow.  Ask your doctor, or the department that is doing the test: ? When will my results be ready? ? How will I get my results? Summary  A cardiac nuclear scan is a test that is done to check the flow of blood to your heart.  Tell your doctor whether you are pregnant or may be pregnant.  Before the test, ask your doctor about changing or stopping your normal medicines. This is important.  Ask your doctor whether you can return to your normal activities. You may be asked to drink more fluids. This information is not intended to replace advice given to you by your health care provider. Make sure you  discuss any questions you have with your health care provider. Document Revised: 02/13/2019 Document Reviewed: 04/09/2018 Elsevier Patient Education  2021 [employer].

## 2021-02-23 NOTE — Addendum Note (Signed)
Addended by: Kendrick Fries on: 02/23/2021 07:47 AM   Modules accepted: Orders

## 2021-02-24 ENCOUNTER — Encounter: Admission: RE | Admit: 2021-02-24 | Payer: Self-pay | Source: Ambulatory Visit

## 2021-03-03 ENCOUNTER — Other Ambulatory Visit: Payer: Self-pay

## 2021-03-03 ENCOUNTER — Encounter
Admission: RE | Admit: 2021-03-03 | Discharge: 2021-03-03 | Disposition: A | Discharge status: Home / Self Care | Payer: Self-pay | Source: Ambulatory Visit | Attending: Physician Assistant | Admitting: Physician Assistant

## 2021-03-03 DIAGNOSIS — R079 Chest pain, unspecified: Secondary | ICD-10-CM | POA: Insufficient documentation

## 2021-03-03 LAB — NM MYOCAR MULTI W/SPECT W/WALL MOTION / EF
Estimated workload: 1 METS
Exercise duration (min): 0 min
Exercise duration (sec): 0 s
LV dias vol: 124 mL (ref 62–150)
LV sys vol: 58 mL
MPHR: 169 {beats}/min
Peak HR: 96 {beats}/min
Percent HR: 56 %
Rest HR: 57 {beats}/min
SDS: 0
SRS: 0
SSS: 3
TID: 1

## 2021-03-03 MED ORDER — TECHNETIUM TC 99M TETROFOSMIN IV KIT
10.0000 | PACK | Freq: Once | INTRAVENOUS | Status: AC | PRN
  Administered 2021-03-03: 10.21 via INTRAVENOUS

## 2021-03-03 MED ORDER — TECHNETIUM TC 99M TETROFOSMIN IV KIT
30.0000 | PACK | Freq: Once | INTRAVENOUS | Status: AC | PRN
  Administered 2021-03-03: 32.11 via INTRAVENOUS

## 2021-03-03 MED ORDER — REGADENOSON 0.4 MG/5ML IV SOLN
0.4000 mg | Freq: Once | INTRAVENOUS | Status: AC
  Administered 2021-03-03: 0.4 mg via INTRAVENOUS

## 2021-03-16 ENCOUNTER — Ambulatory Visit: Payer: Self-pay | Admitting: Gastroenterology

## 2021-03-18 ENCOUNTER — Encounter: Payer: Self-pay | Admitting: *Deleted

## 2021-03-18 ENCOUNTER — Ambulatory Visit: Payer: Self-pay | Admitting: Gastroenterology

## 2021-03-18 NOTE — Progress Notes (Deleted)
Gastroenterology Consultation  Referring Provider:     Earna Coder, * Primary Care Physician:  Patient, No Pcp Per (Inactive) Primary Gastroenterologist:  Dr. Servando Snare     Reason for Consultation:     Hepatitis C        HPI:   Bruce [name] is a 50 y.o. y/o male referred for consultation & management of hepatitis C by Dr. Patient, No Pcp Per (Inactive).  This patient comes in after being noted to have hepatitis C antibody positive as far back as 2013.  The patient has had persistently elevated liver enzymes.  The patient has had elevated liver enzymes with most recent labs showing:  Component     Latest Ref Rng & Units 12/25/2020 12/26/2020 01/25/2021  Albumin     3.5 - 5.0 g/dL 3.5 3.4 (L) 4.0  AST     15 - 41 U/L 77 (H) 78 (H) 63 (H)  ALT     0 - 44 U/L 55 (H) 65 (H) 44  Alkaline Phosphatase     38 - 126 U/L 63 68 80  Total Bilirubin     0.3 - 1.2 mg/dL 0.7 0.4 0.6    Past Medical History:  Diagnosis Date  . A-fib (HCC)   . Dental caries   . Hepatitis C infection   . Iritis   . PAF (paroxysmal atrial fibrillation) (HCC)    a. diagnosed 03/19/18; b. CHADS2VASc 0  . Polysubstance abuse (HCC)    a. cocaine, tobacco, etoh  . Scrotal mass   . Tick borne fever   . Uveitis     Past Surgical History:  Procedure Laterality Date  . FINGER SURGERY     infection, middle left  . fractured jaw    . SHOULDER SURGERY     right    Prior to Admission medications   Medication Sig Start Date End Date Taking? Authorizing Provider  diltiazem (CARDIZEM CD) 120 MG 24 hr capsule Take 1 capsule (120 mg total) by mouth daily. 02/19/21 03/21/21  Sondra Barges, PA-C    Family History  Problem Relation Age of Onset  . Drug abuse Father        throat cancer     Social History   Tobacco Use  . Smoking status: Current Some Day Smoker    Packs/day: 0.25    Types: Cigarettes  . Smokeless tobacco: Never Used  . Tobacco comment: Per pt  " I only smoke cigarettes when I drink  beer"  Vaping Use  . Vaping Use: Never used  Substance Use Topics  . Alcohol use: Yes    Comment: 6 pack per week  . Drug use: Yes    Types: IV, Cocaine    Comment: Pt states that he last used "a while back"    Allergies as of 03/18/2021 - Review Complete 03/03/2021  Allergen Reaction Noted  . Penicillins Other (See Comments) 03/30/2012    Review of Systems:    All systems reviewed and negative except where noted in HPI.   Physical Exam:  There were no vitals taken for this visit. No LMP for male patient. General:   Alert,  Well-developed, well-nourished, pleasant and cooperative in NAD Head:  Normocephalic and atraumatic. Eyes:  Sclera clear, no icterus.   Conjunctiva pink. Ears:  Normal auditory acuity. Neck:  Supple; no masses or thyromegaly. Lungs:  Respirations even and unlabored.  Clear throughout to auscultation.   No wheezes, crackles, or rhonchi. No acute  distress. Heart:  Regular rate and rhythm; no murmurs, clicks, rubs, or gallops. Abdomen:  Normal bowel sounds.  No bruits.  Soft, non-tender and non-distended without masses, hepatosplenomegaly or hernias noted.  No guarding or rebound tenderness.  Negative Carnett sign.   Rectal:  Deferred.  Pulses:  Normal pulses noted. Extremities:  No clubbing or edema.  No cyanosis. Neurologic:  Alert and oriented x3;  grossly normal neurologically. Skin:  Intact without significant lesions or rashes.  No jaundice. Lymph Nodes:  No significant cervical adenopathy. Psych:  Alert and cooperative. Normal mood and affect.  Imaging Studies: NM Myocar Multi W/Spect W/Wall Motion / EF  Result Date: 03/03/2021  T wave inversion was noted during stress.  The study is normal.  This is a low risk study.  The left ventricular ejection fraction is normal (63%).  There is no evidence for ischemia.     Assessment and Plan:   Bruce [name] is a 50 y.o. y/o male ***    Midge Minium, MD. Clementeen Graham    Note: This dictation was  prepared with Dragon dictation along with smaller phrase technology. Any transcriptional errors that result from this process are unintentional.

## 2021-03-23 NOTE — Progress Notes (Deleted)
Cardiology Office Note    Date:  03/23/2021   ID:  Bruce [name], DOB 01-02-1971, MRN [id number]  PCP:  Patient, No Pcp Per (Inactive)  Cardiologist:  Ida Rogue, MD  Electrophysiologist:  None   Chief Complaint: Follow-up  History of Present Illness:   Bruce [name] is a 50 y.o. male with history of PAF, polysubstance abuse with ongoing cocaine, alcohol, and tobacco abuse, HTN, hepatitis C in the setting of IV drug abuse, and medication nonadherence who presents for follow-up of Fonda MPI.  Our group first met him in 03/2018 during hospital admission for newly diagnosed A. fib in the context of binge drinking, cocaine, and tobacco use.  Echo at that time showed an EF of 60 to 65%, no regional wall motion abnormalities, grade 2 diastolic dysfunction, mild mitral regurgitation, normal size left atrium, normal RV systolic function and ventricular cavity size, and PASP normal.  He converted to sinus rhythm with rate control.  He was not started on Gibson at that time given CHA2DS2-VASc of 0.  He was seen in the ED in 09/2018 with recurrent A. fib with RVR in the setting of ongoing cocaine use.  He was started on flecainide and continued on Cardizem.  He converted in the ED.  He was not placed on [place].  Subsequent nuclear stress test in 10/2018 showed no evidence of ischemia and was overall low risk.  Outpatient cardiac monitoring in 10/2018 showed a predominant rhythm of sinus with an average heart rate of 85 bpm, 25 runs of SVT with the longest episode lasting 14 beats.  Patient triggered events were not associated with significant arrhythmia.  Following his visit with [place] in 2019, he has been seen in the ED or admitted to the hospital numerous times with varying complaints including atypical chest pain, ongoing cocaine use, and acute alcohol-induced pancreatitis.  Echo, during admission in 06/2019 for pancreatitis, showed an EF of 55 to 60%, normal LV diastolic function parameters, normal RV  systolic function and ventricular cavity size, normal PASP, and no significant valvular abnormalities.  He was most recently admitted to the hospital from 12/21/20 through 12/26/20 with abdominal and chest pain and was noted to be in A. fib with RVR.  CT of the chest showed findings consistent with recurrent pancreatitis.  Urine drug screen was again positive for cocaine.  He was placed on a diltiazem infusion for rate control and subsequently converted to sinus rhythm.  Echo showed an EF of 55 to 60%, mild LVH, normal RV systolic function and ventricular cavity size, and trivial mitral regurgitation.  High-sensitivity troponin of 18.  It was felt his episode of recurrent A. fib was in the setting of medication noncompliance, polysubstance use, and pancreatitis.  It was recommended he be continued on diltiazem at discharge and in the setting of his CHA2DS2-VASc of 1 with history of noncompliance anticoagulation was deferred.  He was last seen in the office on 02/19/2021 and was doing reasonably well from a cardiac perspective.  He did note some left lateral chest discomfort that improved with elevating and rotating his left arm behind his back the day prior.  He indicated he last used cocaine 1 month prior.  He continues to drink 4-6 beers daily and reported he only smoked when he drank alcohol.  He underwent Lexiscan MPI on 03/03/2021 which showed no evidence of ischemia with an EF of 63% and was overall low risk.  ***   Labs independently reviewed: 01/2021 -  Hgb 15.5, PLT 176, potassium 3.8, BUN 8, serum creatinine 0.78, albumin 4.0, AST 63, ALT normal 12/2020 - magnesium 2.0, A1c 5.8, TSH 0.219, free T4 normal, TC 125, TG 57, HDL 66, LDL 48    Past Medical History:  Diagnosis Date  . A-fib ([place])   . Dental caries   . Hepatitis C infection   . Iritis   . PAF (paroxysmal atrial fibrillation) ([place])    a. diagnosed 03/19/18; b. CHADS2VASc 0  . Polysubstance abuse ([place])    a. cocaine, tobacco, etoh   . Scrotal mass   . Tick borne fever   . Uveitis     Past Surgical History:  Procedure Laterality Date  . FINGER SURGERY     infection, middle left  . fractured jaw    . SHOULDER SURGERY     right    Current Medications: No outpatient medications have been marked as taking for the 03/26/21 encounter (Appointment) with Rise Mu, PA-C.    Allergies:   Penicillins   Social History   Socioeconomic History  . Marital status: Single    Spouse name: Not on file  . Number of children: 0  . Years of education: Not on file  . Highest education level: Not on file  Occupational History    Employer: UNEMPLOYE  Tobacco Use  . Smoking status: Current Some Day Smoker    Packs/day: 0.25    Types: Cigarettes  . Smokeless tobacco: Never Used  . Tobacco comment: Per pt  " I only smoke cigarettes when I drink beer"  Vaping Use  . Vaping Use: Never used  Substance and Sexual Activity  . Alcohol use: Yes    Comment: 6 pack per week  . Drug use: Yes    Types: IV, Cocaine    Comment: Pt states that he last used "a while back"  . Sexual activity: Not on file  Other Topics Concern  . Not on file  Social History Narrative   Lives in [place]; with fiance. 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  ROS:   ROS   EKGs/Labs/Other Studies Reviewed:    Studies reviewed were summarized above. The additional studies were reviewed today:  Lexiscan MPI 03/03/2021:  T wave inversion was noted during stress.  The study is normal.  This is a low risk study.  The left ventricular ejection fraction is normal (63%).  There is no evidence for  ischemia. __________  2D echo 12/2020: 1. Left ventricular ejection fraction, by estimation, is 55 to 60%. The  left ventricle has normal function. Left ventricular endocardial border  not optimally defined to evaluate regional wall motion. There is mild left  ventricular hypertrophy. Left  ventricular diastolic parameters are indeterminate.  2. Right ventricular systolic function is normal. The right ventricular  size is normal. Tricuspid regurgitation signal is inadequate for assessing  PA pressure.  3. The mitral valve is normal in structure. Trivial mitral valve  regurgitation. No evidence of mitral stenosis.  4. The aortic valve has an indeterminant number of cusps. Aortic valve  regurgitation is not visualized. No aortic stenosis is  present. __________  2D echo 06/2019: 1. The left ventricle has normal systolic function, with an ejection  fraction of 55-60%. The cavity size was normal. Left ventricular diastolic  parameters were normal.  2. The right ventricle has normal systolic function. The cavity was  normal. There is no increase in right ventricular wall thickness. Right  ventricular systolic pressure is normal with an estimated pressure of 20.6  mmHg.  3. No evidence of mitral valve stenosis.  4. The tricuspid valve is grossly normal.  5. The aortic valve is grossly normal. No stenosis of the aortic valve.  6. The aorta is normal unless otherwise noted. __________  Nuclear stress test 10/2018:  T wave inversion was noted during stress in the III leads.  There was no ST segment deviation noted during stress.  The study is normal.  This is a low risk study.  Nuclear stress EF: 50%.  __________  Elwyn Reach patch 10/2018: Normal sinus rhythm Avg HR of 85 bpm.  25 Supraventricular Tachycardia runs occurred, the run with the fastest interval lasting 5 beats with a max rate of 207 bpm, the longest lasting 14 beats with an avg rate of 156 bpm.   Isolated  SVEs were occasional (1.1%, 15308), SVE Couplets were rare (<1.0%, 137), and SVE Triplets were rare (<1.0%, 42). Isolated VEs were rare (<1.0%), VE Couplets were rare (<1.0%), and no VE Triplets were present. Ventricular Bigeminy was present.  Patient triggered events were not associated with significant arrhythmia __________  2D echo 03/2018: - Left ventricle: The cavity size was normal. Systolic function was  normal. The estimated ejection fraction was in the range of 60%  to 65%. Wall motion was normal; there were no regional wall  motion abnormalities. Features are consistent with a pseudonormal  left ventricular filling pattern, with concomitant abnormal  relaxation and increased filling pressure (grade 2 diastolic  dysfunction).  - Mitral valve: There was mild regurgitation.  - Left atrium: The atrium was normal in size.  - Right ventricle: Systolic function was normal.  - Pulmonary arteries: Systolic pressure was within the normal  range.   EKG:  EKG is ordered today.  The EKG ordered today demonstrates ***  Recent Labs: 12/21/2020: TSH 0.219 12/26/2020: Magnesium 2.0 01/25/2021: ALT 44; BUN 8; Creatinine, Ser 0.78; Hemoglobin 15.5; Platelets 176; Potassium 3.8; Sodium 138  Recent Lipid Panel    Component Value Date/Time   CHOL 125 12/22/2020 0506   TRIG 57 12/22/2020 0506   HDL 66 12/22/2020 0506   CHOLHDL 1.9 12/22/2020 0506   VLDL 11 12/22/2020 0506   LDLCALC 48 12/22/2020 0506    PHYSICAL EXAM:    VS:  There were no vitals taken for this visit.  BMI: There is no height or weight on file to calculate BMI.  Physical Exam  Wt Readings from Last 3 Encounters:  02/19/21 165 lb 2 oz (74.9 kg)  01/25/21 162 lb 8 oz (73.7 kg)  12/23/20 161 lb 9.6 oz (73.3 kg)     ASSESSMENT & PLAN:   1. PAF: ***.  CHA2DS2-VASc of 1.  ***  2. Chest pain:  3. HTN: Blood pressure ***  4. Polysubstance abuse:  5. Medication nonadherence:  Disposition: F/u with  Dr. Marland Kitchen or an APP in ***.   Medication Adjustments/Labs and Tests Ordered: Current medicines are reviewed at length with the patient today.  Concerns regarding medicines are outlined above. Medication changes, Labs and Tests ordered today are summarized above and listed in the Patient Instructions  accessible in Encounters.   Signed, Christell Faith, PA-C 03/23/2021 3:46 PM     [facility] [address] [address] [place], [place] [address] [phone]

## 2021-03-26 ENCOUNTER — Ambulatory Visit: Payer: Self-pay | Admitting: Physician Assistant

## 2021-04-02 ENCOUNTER — Ambulatory Visit: Payer: Self-pay | Admitting: Physician Assistant

## 2021-04-08 NOTE — Progress Notes (Deleted)
Cardiology Office Note    Date:  04/08/2021   ID:  Bruce [name], DOB May 20, 1971, MRN [id number]  PCP:  Patient, No Pcp Per (Inactive)  Cardiologist:  Ida Rogue, MD  Electrophysiologist:  None   Chief Complaint: Follow-up  History of Present Illness:   Bruce [name] is a 50 y.o. male with history of PAF, polysubstance abuse with ongoing cocaine, alcohol, and tobacco abuse, HTN, hepatitis C in the setting of IV drug abuse, and medication nonadherence who presents for follow-up of [place] MPI.  Our group first met him in 03/2018 during hospital admission for newly diagnosed A. fib in the context of binge drinking, cocaine, and tobacco use.  Echo at that time showed an EF of 60 to 65%, no regional wall motion abnormalities, grade 2 diastolic dysfunction, mild mitral regurgitation, normal size left atrium, normal RV systolic function and ventricular cavity size, and PASP normal.  He converted to sinus rhythm with rate control.  He was not started on [place] at that time given CHA2DS2-VASc of 0.  He was seen in the ED in 09/2018 with recurrent A. fib with RVR in the setting of ongoing cocaine use.  He was started on flecainide and continued on Cardizem.  He converted in the ED.  He was not placed on [place].  Subsequent nuclear stress test in 10/2018 showed no evidence of ischemia and was overall low risk.  Outpatient cardiac monitoring in 10/2018 showed a predominant rhythm of sinus with an average heart rate of 85 bpm, 25 runs of SVT with the longest episode lasting 14 beats.  Patient triggered events were not associated with significant arrhythmia.  Following his visit with [place] in 2019, he has been seen in the ED or admitted to the hospital numerous times with varying complaints including atypical chest pain, ongoing cocaine use, and acute alcohol-induced pancreatitis.  Echo, during admission in 06/2019 for pancreatitis, showed an EF of 55 to 60%, normal LV diastolic function parameters, normal RV  systolic function and ventricular cavity size, normal PASP, and no significant valvular abnormalities.  He was most recently admitted to the hospital from 12/21/20 through 12/26/20 with abdominal and chest pain and was noted to be in A. fib with RVR.  CT of the chest showed findings consistent with recurrent pancreatitis.  Urine drug screen was again positive for cocaine.  He was placed on a diltiazem infusion for rate control and subsequently converted to sinus rhythm.  Echo showed an EF of 55 to 60%, mild LVH, normal RV systolic function and ventricular cavity size, and trivial mitral regurgitation.  High-sensitivity troponin of 18.  It was felt his episode of recurrent A. fib was in the setting of medication noncompliance, polysubstance use, and pancreatitis.  It was recommended he be continued on diltiazem at discharge and in the setting of his CHA2DS2-VASc of 1 with history of noncompliance anticoagulation was deferred.  He was last seen in the office on 02/19/2021 and was doing reasonably well from a cardiac perspective.  He did note some left lateral chest discomfort that improved with elevating and rotating his left arm behind his back the day prior.  He indicated he last used cocaine 1 month prior.  He continues to drink 4-6 beers daily and reported he only smoked when he drank alcohol.  He underwent Lexiscan MPI on 03/03/2021 which showed no evidence of ischemia with an EF of 63% and was overall low risk.  ***   Labs independently reviewed: 01/2021 -  Hgb 15.5, PLT 176, potassium 3.8, BUN 8, serum creatinine 0.78, albumin 4.0, AST 63, ALT normal 12/2020 - magnesium 2.0, A1c 5.8, TSH 0.219, free T4 normal, TC 125, TG 57, HDL 66, LDL 48    Past Medical History:  Diagnosis Date  . A-fib ([facility])   . Dental caries   . Hepatitis C infection   . Iritis   . PAF (paroxysmal atrial fibrillation) ([facility])    a. diagnosed 03/19/18; b. CHADS2VASc 0  . Polysubstance abuse ([place])    a. cocaine, tobacco, etoh   . Scrotal mass   . Tick borne fever   . Uveitis     Past Surgical History:  Procedure Laterality Date  . FINGER SURGERY     infection, middle left  . fractured jaw    . SHOULDER SURGERY     right    Current Medications: No outpatient medications have been marked as taking for the 04/13/21 encounter (Appointment) with Rise Mu, PA-C.    Allergies:   Penicillins   Social History   Socioeconomic History  . Marital status: Single    Spouse name: Not on file  . Number of children: 0  . Years of education: Not on file  . Highest education level: Not on file  Occupational History    Employer: UNEMPLOYE  Tobacco Use  . Smoking status: Current Some Day Smoker    Packs/day: 0.25    Types: Cigarettes  . Smokeless tobacco: Never Used  . Tobacco comment: Per pt  " I only smoke cigarettes when I drink beer"  Vaping Use  . Vaping Use: Never used  Substance and Sexual Activity  . Alcohol use: Yes    Comment: 6 pack per week  . Drug use: Yes    Types: IV, Cocaine    Comment: Pt states that he last used "a while back"  . Sexual activity: Not on file  Other Topics Concern  . Not on file  Social History Narrative   Lives in [place]; with fiance. 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  ROS:   ROS   EKGs/Labs/Other Studies Reviewed:    Studies reviewed were summarized above. The additional studies were reviewed today:  Lexiscan MPI 03/03/2021:  T wave inversion was noted during stress.  The study is normal.  This is a low risk study.  The left ventricular ejection fraction is normal (63%).  There is no evidence for  ischemia. __________  2D echo 12/2020: 1. Left ventricular ejection fraction, by estimation, is 55 to 60%. The  left ventricle has normal function. Left ventricular endocardial border  not optimally defined to evaluate regional wall motion. There is mild left  ventricular hypertrophy. Left  ventricular diastolic parameters are indeterminate.  2. Right ventricular systolic function is normal. The right ventricular  size is normal. Tricuspid regurgitation signal is inadequate for assessing  PA pressure.  3. The mitral valve is normal in structure. Trivial mitral valve  regurgitation. No evidence of mitral stenosis.  4. The aortic valve has an indeterminant number of cusps. Aortic valve  regurgitation is not visualized. No aortic stenosis is  present. __________  2D echo 06/2019: 1. The left ventricle has normal systolic function, with an ejection  fraction of 55-60%. The cavity size was normal. Left ventricular diastolic  parameters were normal.  2. The right ventricle has normal systolic function. The cavity was  normal. There is no increase in right ventricular wall thickness. Right  ventricular systolic pressure is normal with an estimated pressure of 20.6  mmHg.  3. No evidence of mitral valve stenosis.  4. The tricuspid valve is grossly normal.  5. The aortic valve is grossly normal. No stenosis of the aortic valve.  6. The aorta is normal unless otherwise noted. __________  Nuclear stress test 10/2018:  T wave inversion was noted during stress in the III leads.  There was no ST segment deviation noted during stress.  The study is normal.  This is a low risk study.  Nuclear stress EF: 50%.  __________  Elwyn Reach patch 10/2018: Normal sinus rhythm Avg HR of 85 bpm.  25 Supraventricular Tachycardia runs occurred, the run with the fastest interval lasting 5 beats with a max rate of 207 bpm, the longest lasting 14 beats with an avg rate of 156 bpm.   Isolated  SVEs were occasional (1.1%, 15308), SVE Couplets were rare (<1.0%, 137), and SVE Triplets were rare (<1.0%, 42). Isolated VEs were rare (<1.0%), VE Couplets were rare (<1.0%), and no VE Triplets were present. Ventricular Bigeminy was present.  Patient triggered events were not associated with significant arrhythmia __________  2D echo 03/2018: - Left ventricle: The cavity size was normal. Systolic function was  normal. The estimated ejection fraction was in the range of 60%  to 65%. Wall motion was normal; there were no regional wall  motion abnormalities. Features are consistent with a pseudonormal  left ventricular filling pattern, with concomitant abnormal  relaxation and increased filling pressure (grade 2 diastolic  dysfunction).  - Mitral valve: There was mild regurgitation.  - Left atrium: The atrium was normal in size.  - Right ventricle: Systolic function was normal.  - Pulmonary arteries: Systolic pressure was within the normal  range.   EKG:  EKG is ordered today.  The EKG ordered today demonstrates ***  Recent Labs: 12/21/2020: TSH 0.219 12/26/2020: Magnesium 2.0 01/25/2021: ALT 44; BUN 8; Creatinine, Ser 0.78; Hemoglobin 15.5; Platelets 176; Potassium 3.8; Sodium 138  Recent Lipid Panel    Component Value Date/Time   CHOL 125 12/22/2020 0506   TRIG 57 12/22/2020 0506   HDL 66 12/22/2020 0506   CHOLHDL 1.9 12/22/2020 0506   VLDL 11 12/22/2020 0506   LDLCALC 48 12/22/2020 0506    PHYSICAL EXAM:    VS:  There were no vitals taken for this visit.  BMI: There is no height or weight on file to calculate BMI.  Physical Exam  Wt Readings from Last 3 Encounters:  02/19/21 165 lb 2 oz (74.9 kg)  01/25/21 162 lb 8 oz (73.7 kg)  12/23/20 161 lb 9.6 oz (73.3 kg)     ASSESSMENT & PLAN:   1. PAF: ***.  CHA2DS2-VASc of 1.  ***  2. Chest pain:  3. HTN: Blood pressure ***  4. Polysubstance abuse:  5. Medication nonadherence:  Disposition: F/u with  Dr. Rockey Situ or an APP in ***.   Medication Adjustments/Labs and Tests Ordered: Current medicines are reviewed at length with the patient today.  Concerns regarding medicines are outlined above. Medication changes, Labs and Tests ordered today are summarized above and listed in the Patient Instructions  accessible in Encounters.   Signed, Christell Faith, PA-C 04/08/2021 12:29 PM     [facility] [address] [place] [address] [place], [place] [address] [phone]

## 2021-04-13 ENCOUNTER — Ambulatory Visit: Payer: Self-pay | Admitting: Physician Assistant

## 2021-06-08 NOTE — Progress Notes (Signed)
Cardiology Office Note:    Date:  06/09/2021   ID:  Bruce [name], DOB [birth year], MRN [id number]  PCP:  Patient, No Pcp Per (Inactive)  [facility] Cardiologist:  Julien Nordmann, MD  [facility] Electrophysiologist:  None   Referring MD: No ref. provider found   Chief Complaint: overdue follow-up  History of Present Illness:    Bruce [name] is a 50 y.o. male with a hx of PAF, polysubstance abuse with ongoing cocaine, alcohol and tobacco abuse, HTN, hepatitis C in the setting of IV drug use, and medications noncompliance who presents for follow-up.   First seen in 03/2018 during hospitalization for afib in the context of binge drinking, cocaine and tobacco use. Echo at that time showed EF 60-65%, no WMA, G2DD, mild MR, normal size left atrium, normal RV systolic function and ventricular cavity size, PASP normal. He converted to SR with rate control. He was not started on [facility] given CHADSVAC of 1. Seen in the ED 09/2018 with recurrent afib wit RVR in the setting of cocaine use. He was started on flecainide and continued cardizem. He converted to SR in the ED. Subsequent nuclear stress test in 10/2018 showed no evidence of ischemia and was overall low risk. Outpatient cardiac monitoring 10/2018 showed NSR, pSVT longest episode lasting 14 beats.   Echo 06/2019 showed EF 55-60%, normal LV diastolic function parameters, normal RV systolic function and ventricular cavity size, normal PASP, and no significant valvular abnormalities.   Admitted 2/14-2/19/22 with abdominal pain and chest pain and noted to be in afib RVR. CT chest showed findings consistent with recurrent pancreatitis. UDS positive for cocaine. He was placed on dilt infusion and subsequently converted to SR. Echo showed EF 55-60%, mlid LVH, normal RV function, trivial MR. Afib felt to be in the setting of medication noncompliance, polysubstance use, and pancreatitis.   Seen 02/09/21 and was doing well from a cardiac  perspective. HE was in SR. CHADSVASC 1 and again PAC deferred. Lexiscan was ordered for atypical chest pain.   Today, the patient is requesting a referral for hepatitis clinic. He missed his last appointment and was told he needs another referral. Reports cocaine use intermittently. Drinking alcohol every two weeks. Smokes 1 pack every 2 days. Lives with fiance, been engaged for 5 years. He goes to work with fiance at times. Patient denies chest pain, sob, orthopnea, pnd, lightheadedness, dizziness. BP mildly elevated but hasn't had diltiazem in a month. Labs in March reviewed. Also reports atypical chest pain similar to prior visit. Myoview Lexiscan with no ischemia, this was reviewed with the patient.     Past Medical History:  Diagnosis Date   A-fib [facility])    Dental caries    Hepatitis C infection    Iritis    PAF (paroxysmal atrial fibrillation) ([facility])    a. diagnosed 03/19/18; b. CHADS2VASc 0   Polysubstance abuse ([facility])    a. cocaine, tobacco, etoh   Scrotal mass    Tick borne fever    Uveitis     Past Surgical History:  Procedure Laterality Date   FINGER SURGERY     infection, middle left   fractured jaw     SHOULDER SURGERY     right    Current Medications: Current Meds  Medication Sig   Cyanocobalamin (B-12 PO) Take by mouth as needed.     Allergies:   Penicillins   Social History   Socioeconomic History   Marital status: Single  Spouse name: Not on file   Number of children: 0   Years of education: Not on file   Highest education level: Not on file  Occupational History    Employer: UNEMPLOYE  Tobacco Use   Smoking status: Some Days    Packs/day: 0.25    Types: Cigarettes   Smokeless tobacco: Never   Tobacco comments:    Per pt  " I only smoke cigarettes when I drink beer"  Vaping Use   Vaping Use: Never used  Substance and Sexual Activity   Alcohol use: Yes    Comment: 6 pack per week   Drug use: Yes    Types: IV, Cocaine    Comment: Pt states  that he last used "a while back"   Sexual activity: Not on file  Other Topics Concern   Not on file  Social History Narrative   Lives in [place]; with fiance. 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  ROS:   Please see the history of present illness.     All other systems reviewed and are negative.  EKGs/Labs/Other Studies Reviewed:    The following studies were reviewed today:  Myoview stress test 02/2021 Study Result  Narrative & Impression  T wave inversion was noted during stress. The study is normal. This is a low risk study. The left ventricular ejection fraction is normal (63%). There is no evidence for ischemia     Echo 12/22/20  1. Left ventricular ejection fraction, by estimation, is 55 to 60%. The  left ventricle has normal function. Left ventricular endocardial border  not optimally defined to evaluate regional wall motion. There is mild left  ventricular hypertrophy. Left  ventricular diastolic parameters are indeterminate.   2. Right ventricular systolic function is normal. The right ventricular  size is normal. Tricuspid regurgitation signal is inadequate for assessing  PA pressure.   3. The mitral valve is normal in structure. Trivial mitral valve  regurgitation. No evidence of mitral stenosis.   4. The aortic valve has an indeterminant number of cusps. Aortic valve  regurgitation is not visualized. No aortic stenosis is present.   EKG:  EKG is  ordered today.  The ekg ordered today demonstrates NSR, PAC, 78bpm, no significant changes  Recent Labs: 12/21/2020: TSH 0.219 12/26/2020: Magnesium 2.0 01/25/2021: ALT 44;  BUN 8; Creatinine, Ser 0.78; Hemoglobin 15.5; Platelets 176; Potassium 3.8; Sodium 138  Recent Lipid Panel    Component Value Date/Time   CHOL 125 12/22/2020 0506   TRIG 57 12/22/2020 0506   HDL 66 12/22/2020 0506   CHOLHDL 1.9 12/22/2020 0506   VLDL 11 12/22/2020 0506   LDLCALC 48 12/22/2020 0506     Physical Exam:    VS:  BP 140/90 (BP Location: Left Arm, Patient Position: Sitting, Cuff Size: Normal)   Pulse 78   Ht 5\' 7"  (1.702 m)   Wt 158 lb 6 oz (71.8 kg)   SpO2 96%   BMI 24.81 kg/m     Wt Readings from Last 3 Encounters:  06/09/21 158 lb 6 oz (71.8 kg)  02/19/21 165 lb 2 oz (74.9 kg)  01/25/21 162 lb 8 oz (73.7 kg)     GEN:  Well nourished, well developed in no acute distress HEENT: Normal NECK: No JVD; No carotid bruits LYMPHATICS: No lymphadenopathy CARDIAC: RRR, no murmurs, rubs, gallops RESPIRATORY:  Clear to auscultation without rales, wheezing or rhonchi  ABDOMEN: Soft, non-tender, non-distended MUSCULOSKELETAL:  No edema; No deformity  SKIN: Warm and dry NEUROLOGIC:  Alert and oriented x 3 PSYCHIATRIC:  Normal affect   ASSESSMENT:    1. PAF (paroxysmal atrial fibrillation) (HCC)   2. Hepatitis   3. Polysubstance abuse ([facility])   4. Essential hypertension   5. Hepatitis C virus infection without hepatic coma, unspecified chronicity   6. Atypical chest pain    PLAN:    In order of problems listed above:  PAF Patient is in SR today. CHADSVASC of 1 for HTN, no indication for anticoagulation at this time. Hasn't has diltiazem in a month, we will refill this.   Atypical Chest pain Reports atypical chest pain similar to the last visit. Myoview Lexiscan reviewed during the visit, which is overall reassuring. Non-cardiac chest pain discussed with patient.  HTN BP mildly elevated, but has not had BP medication in a month. Refill diltiazem as above.   Polysubstance abuse Hepatitis C from IVDU Still using cocaine intermittently. Reports occasional  alcohol use and still smoking tobacco. He was previously given a referral to hepatitis clinic, but missed his last appointment. I will provide another referral.  Medication nonadherance Patient ran out if diltiazem 1 month ago. Will refill this and compliance encouraged.   Disposition: Follow up in 6 month(s) with MD/APP    Signed, Oshae Simmering David Stall, PA-C  06/09/2021 3:40 PM     [facility]

## 2021-06-09 ENCOUNTER — Other Ambulatory Visit: Payer: Self-pay

## 2021-06-09 ENCOUNTER — Ambulatory Visit (INDEPENDENT_AMBULATORY_CARE_PROVIDER_SITE_OTHER): Payer: Self-pay | Admitting: Medical

## 2021-06-09 ENCOUNTER — Encounter: Payer: Self-pay | Admitting: Medical

## 2021-06-09 VITALS — BP 140/90 | HR 78 | Ht 67.0 in | Wt 158.4 lb

## 2021-06-09 DIAGNOSIS — F1910 Other psychoactive substance abuse, uncomplicated: Secondary | ICD-10-CM

## 2021-06-09 DIAGNOSIS — K759 Inflammatory liver disease, unspecified: Secondary | ICD-10-CM

## 2021-06-09 DIAGNOSIS — R0789 Other chest pain: Secondary | ICD-10-CM

## 2021-06-09 DIAGNOSIS — I480 Paroxysmal atrial fibrillation: Secondary | ICD-10-CM

## 2021-06-09 DIAGNOSIS — I10 Essential (primary) hypertension: Secondary | ICD-10-CM

## 2021-06-09 DIAGNOSIS — B1920 Unspecified viral hepatitis C without hepatic coma: Secondary | ICD-10-CM

## 2021-06-09 MED ORDER — DILTIAZEM HCL ER COATED BEADS 120 MG PO CP24: 120.0000 mg | ORAL_CAPSULE | Freq: Every day | ORAL | 3 refills | Status: DC

## 2021-06-09 NOTE — Patient Instructions (Signed)
Medication Instructions:  No changes at this time. Refills have been sent in for your medication.   *If you need a refill on your cardiac medications before your next appointment, please call your pharmacy*   Lab Work: None  If you have labs (blood work) drawn today and your tests are completely normal, you will receive your results only by: MyChart Message (if you have MyChart) OR A paper copy in the mail If you have any lab test that is abnormal or we need to change your treatment, we will call you to review the results.   Testing/Procedures: None   Follow-Up: At [facility], you and your health needs are our priority.  As part of our continuing mission to provide you with exceptional heart care, we have created designated Provider Care Teams.  These Care Teams include your primary Cardiologist (physician) and Advanced Practice Providers (APPs -  Physician Assistants and Nurse Practitioners) who all work together to provide you with the care you need, when you need it.  We recommend signing up for the patient portal called "MyChart".  Sign up information is provided on this After Visit Summary.  MyChart is used to connect with patients for Virtual Visits (Telemedicine).  Patients are able to view lab/test results, encounter notes, upcoming appointments, etc.  Non-urgent messages can be sent to your provider as well.   To learn more about what you can do with MyChart, go to [email or web address].    Your next appointment:   6 month(s)  The format for your next appointment:   In Person  Provider:   You may see Julien Nordmann, MD or one of the following Advanced Practice Providers on your designated Care Team:   Nicolasa Ducking, NP [name], PA-C Marisue Ivan, PA-C Cadence Fransico Michael, [place]   Other Instructions Referral has been placed for the Hepatitis Clinic

## 2021-06-25 ENCOUNTER — Other Ambulatory Visit (HOSPITAL_COMMUNITY): Payer: Self-pay

## 2021-06-25 ENCOUNTER — Telehealth: Payer: Self-pay

## 2021-06-25 NOTE — Telephone Encounter (Signed)
[facility] Patient Advocate Encounter ? ?Insurance verification completed.   ? ?The patient is uninsured and will need patient assistance for medication. ? ?We can complete the application and will need to meet with the patient for signatures and income documentation. ? ?Ashwika Freels, CPhT ?Specialty Pharmacy Patient Advocate ?[facility] ?Phone: [phone] ?Fax:  [phone]  ?

## 2021-06-29 ENCOUNTER — Other Ambulatory Visit: Payer: Self-pay

## 2021-06-29 ENCOUNTER — Ambulatory Visit (INDEPENDENT_AMBULATORY_CARE_PROVIDER_SITE_OTHER): Payer: Self-pay | Admitting: Infectious Diseases

## 2021-06-29 ENCOUNTER — Encounter: Payer: Self-pay | Admitting: Infectious Diseases

## 2021-06-29 VITALS — BP 131/84 | HR 77 | Temp 98.0°F | Ht 67.0 in | Wt 160.0 lb

## 2021-06-29 DIAGNOSIS — K852 Alcohol induced acute pancreatitis without necrosis or infection: Secondary | ICD-10-CM

## 2021-06-29 DIAGNOSIS — B182 Chronic viral hepatitis C: Secondary | ICD-10-CM

## 2021-06-29 DIAGNOSIS — Z5989 Other problems related to housing and economic circumstances: Secondary | ICD-10-CM | POA: Insufficient documentation

## 2021-06-29 NOTE — Progress Notes (Signed)
Patient Name: Bruce [name]  Date of Birth: 1971/10/11  MRN: [id number]  PCP: Patient, No Pcp Per (Inactive)  Referring Provider: Janene Harvey, Ph#: [phone]   CC:  New patient - initial evaluation and management of chronic hepatitis C infection.  HPI/ROS:  Bruce [name] is a 50 y.o. male here for evaluation of hepatitis C antibody test. Previously in 2013 documented that he had genotype 1b with VL 700,490. Since that time he has not had any further work up or treatment.   History of polysubstance abuse including alcohol use. Recurrent pancreatitis requiring hospitalization 24m ago. Referred to Hematology recently for evaluation of leucopenia.   CT scan in February 2022 Hepatobiliary: Diffusely decreased hepatic density consistent with steatosis. No focal hepatic abnormality. Unremarkable gallbladder. No gallstone or cholecystitis. There is no biliary dilatation.  Patient does not have documented immunity to Hepatitis A. Patient does not have documented immunity to Hepatitis B.    Review of Systems  Constitutional:  Negative for appetite change, chills, fatigue, fever and unexpected weight change.  Eyes:  Negative for visual disturbance.  Respiratory:  Negative for cough and shortness of breath.   Cardiovascular:  Negative for chest pain and leg swelling.  Gastrointestinal:  Negative for abdominal pain, diarrhea and nausea.  Genitourinary:  Positive for decreased urine volume and frequency. Negative for dysuria, genital sores, penile discharge and urgency.  Musculoskeletal:  Negative for joint swelling.  Skin:  Negative for color change and rash.  Neurological:  Negative for dizziness and headaches.  Hematological:  Negative for adenopathy.  Psychiatric/Behavioral:  Negative for sleep disturbance. The patient is not nervous/anxious.    All other systems reviewed and are negative      Past Medical History:  Diagnosis Date   A-fib (HCC)    Dental caries     Hepatitis C infection    Iritis    PAF (paroxysmal atrial fibrillation) (HCC)    a. diagnosed 03/19/18; b. CHADS2VASc 0   Polysubstance abuse (HCC)    a. cocaine, tobacco, etoh   Scrotal mass    Tick borne fever    Uveitis     Prior to Admission medications   Medication Sig Start Date End Date Taking? Authorizing Provider  Cyanocobalamin (B-12 PO) Take by mouth as needed.    [provider]  diltiazem (CARDIZEM CD) 120 MG 24 hr capsule Take 1 capsule (120 mg total) by mouth daily. 06/09/21 07/09/21  Furth, Cadence H, PA-C    Allergies  Allergen Reactions   Penicillins Other (See Comments)    Did it involve swelling of the face/tongue/throat, SOB, or low BP? No Did it involve sudden or severe rash/hives, skin peeling, or any reaction on the inside of your mouth or nose? No Did you need to seek medical attention at a hospital or doctor's office? No When did it last happen?       If all above answers are "NO", may proceed with cephalosporin use.    Social History   Tobacco Use   Smoking status: Some Days    Packs/day: 0.25    Types: Cigarettes   Smokeless tobacco: Never   Tobacco comments:    Per pt  " I only smoke cigarettes when I drink beer"  Vaping Use   Vaping Use: Never used  Substance Use Topics   Alcohol use: Yes    Comment: 6 pack per week   Drug use: Yes    Types: IV, Cocaine  Comment: Pt states that he last used "a while back"    Family History  Problem Relation Age of Onset   Drug abuse Father        throat cancer     Objective:   Vitals:   06/29/21 1451  BP: 131/84  Pulse: 77  Temp: 98 F (36.7 C)  SpO2: 100%   Constitutional: in no apparent distress, well developed and well nourished, and oriented times 3 Eyes: anicteric Cardiovascular: Cor RRR Respiratory: clear Gastrointestinal: Bowel sounds are normal, liver is not enlarged, spleen is not enlarged Musculoskeletal: peripheral pulses normal, no pedal edema, no clubbing or  cyanosis Skin: negative for - jaundice, spider hemangioma, telangiectasia, palmar erythema, ecchymosis and atrophy; no porphyria cutanea tarda Lymphatic: no cervical lymphadenopathy   Laboratory: Genotype:  Lab Results  Component Value Date   HCVGENOTYPE 1b 05/04/2012   HCV viral load:  Lab Results  Component Value Date   HCVQUANT 700,490 (H) 05/22/2012   Lab Results  Component Value Date   WBC 3.1 (L) 01/25/2021   HGB 15.5 01/25/2021   HCT 44.2 01/25/2021   MCV 91.5 01/25/2021   PLT 176 01/25/2021    Lab Results  Component Value Date   CREATININE 0.78 01/25/2021   BUN 8 01/25/2021   NA 138 01/25/2021   K 3.8 01/25/2021   CL 103 01/25/2021   CO2 24 01/25/2021    Lab Results  Component Value Date   ALT 44 01/25/2021   AST 63 (H) 01/25/2021   ALKPHOS 80 01/25/2021    Lab Results  Component Value Date   INR 1.4 (H) 06/25/2019   BILITOT 0.6 01/25/2021   ALBUMIN 4.0 01/25/2021    Imaging:  CT scan in February 2022 as outlined above. +Hepatic steatosis   Assessment & Plan:   Problem List Items Addressed This Visit       Unprioritized   Underinsured    Waiting for Medicaid currently. Will help provide him with [facility] lab assistance and work with him for medication assistance.       Hepatitis C infection - Primary    New Patient with Chronic Hepatitis C genotype 1b previously documented in 2013, treatment naive. Suspect previous drug use for transmission risk.   I discussed with the patient the lab findings that confirm chronic hepatitis C as well as the natural history and progression of disease including about 30% of people who develop cirrhosis of the liver if left untreated and once cirrhosis is established there is a 2-7% risk per year of liver cancer and liver failure.  I discussed the importance of treatment and benefits in reducing the risk, even if significant liver fibrosis exists. I also discussed risk for re-infection following treatment should he not  continue to modify risk factors.   Patient counseled on limiting acetaminophen to no more than 2 grams daily, avoidance of alcohol. Transmission discussed with patient including sexual transmission, sharing razors and toothbrush. I asked his fiance to get testing with her regular doctor.  Will need referral to gastroenterology if concern for cirrhosis Will prescribe appropriate medication based on genotype and coverage  Hepatitis A and B titers to be drawn today with appropriate vaccinations as needed  Pneumovax vaccine at upcoming visit if not previously given Further work up to include liver staging through non-invasive serum analysis with APRI and FIB4 scores and Liver Fibrosis panel; CT scan remarked hepatic steatosis.   Will call Bruce [name] back once all results are in and counsel  on medication over the phone. He will return 4 weeks after starting to meet with pharmacy team and check RNA at that time.        Relevant Orders   Hepatitis C genotype   Hepatitis B surface antigen   Hepatitis B surface antibody,quantitative   Hepatitis C RNA quantitative   Liver Fibrosis, FibroTest-ActiTest   Protime-INR   COMPLETE METABOLIC PANEL WITH GFR   CBC   Alcohol-induced pancreatitis    Counseled regarding preference to eliminate alcohol beverages altogether. He drinks 6 pack of beer a few days a week. Would like to see him limit this to 2 cans of beer a few days a week.        I spent 45 minutes with the patient re: counsel of the patient re hepatitis c and the details described above and in coordination of their care.   Rexene Alberts, MSN, NP-C [facility]  [email or web address] Pager: [phone] Office: [phone] [facility] Main Line: [phone]

## 2021-06-29 NOTE — Assessment & Plan Note (Signed)
Counseled regarding preference to eliminate alcohol beverages altogether. He drinks 6 pack of beer a few days a week. Would like to see him limit this to 2 cans of beer a few days a week.

## 2021-06-29 NOTE — Assessment & Plan Note (Signed)
Waiting for Medicaid currently. Will help provide him with [facility] lab assistance and work with him for medication assistance.

## 2021-06-29 NOTE — Assessment & Plan Note (Signed)
New Patient with Chronic Hepatitis C genotype 1b previously documented in 2013, treatment naive. Suspect previous drug use for transmission risk.   I discussed with the patient the lab findings that confirm chronic hepatitis C as well as the natural history and progression of disease including about 30% of people who develop cirrhosis of the liver if left untreated and once cirrhosis is established there is a 2-7% risk per year of liver cancer and liver failure.  I discussed the importance of treatment and benefits in reducing the risk, even if significant liver fibrosis exists. I also discussed risk for re-infection following treatment should he not continue to modify risk factors.    Patient counseled on limiting acetaminophen to no more than 2 grams daily, avoidance of alcohol.  Transmission discussed with patient including sexual transmission, sharing razors and toothbrush. I asked his fiance to get testing with her regular doctor.   Will need referral to gastroenterology if concern for cirrhosis  Will prescribe appropriate medication based on genotype and coverage   Hepatitis A and B titers to be drawn today with appropriate vaccinations as needed   Pneumovax vaccine at upcoming visit if not previously given  Further work up to include liver staging through non-invasive serum analysis with APRI and FIB4 scores and Liver Fibrosis panel; CT scan remarked hepatic steatosis.   Will call SAYVON ARTERBERRY II back once all results are in and counsel on medication over the phone. He will return 4 weeks after starting to meet with pharmacy team and check RNA at that time.

## 2021-06-29 NOTE — Patient Instructions (Addendum)
Nice to meet you today!    We need to get a little more information about your hepatitis c infection before we start your treatment. I anticipate that we can get you started in a few weeks. I will call you when all of your lab results are back - should be about a week or so.   For Steward Drone - ask your doctor to check you for hepatitis C or make sure they have done it for you recently.     ABOUT HEPATITIS C VIRUS:  Chronic Hepatitis C is the most common blood-borne infection in the [place], affecting approximately 3 million people.  It is the leading cause of cirrhosis, liver cancer, and end stage liver disease requiring transplantation when this infection goes untreated for many years  The majority of people who are infected are unaware because there are not many early symptoms that are specific to this and often go undiagnosed until a specific blood test is drawn.   The hepatitis c virus is passed primarily through direct exposure of contaminated blood or body fluids. It is most efficiently transmitted through repeated exposure to infected blood.  Risk for sexual transmission is very low but is possible if there is high frequency of unprotected sexual activity with known hepatitis c partner or multiple partners of known status.  Over time, approximately 60-70% of people can develop some degree of liver disease. Cirrhosis occurs in 10-20% of those with chronic infection. 1-5% will get liver cancer, which has a very high rate of death.   Approximately 15-25% clear the infection without medication (usually in the first 6 months of becoming exposed to virus)  Newer medications provide over 95% cure rate when taken as prescribed    IN GENERAL ABOUT DIET  Persons living with chronic hepatitis c infection should eat a diet to maintain a healthy weight and avoid nutritional deficiencies.   Completely avoiding alcohol is the best decision for your liver health. If unable to do so please  limit alcohol to as little as possible to less than 1 standard drink a day - this is very irritating to your liver.  Limit tylenol use to less than 2,000 mg daily (two extra strength tablets only twice a day)  If you have cirrhosis of the liver please take no more than 1,000 mg tylenol a day  Patients with cirrhosis should not have protein restriction; we recommend a protein intake of approximately 1.2-1.5 g/kg/day.   For patients with cirrhosis and hepatic encephalopathy, the American Association for the Study of Liver Diseases (AASLD) recommended protein intake is 1.2-1.5 g/kg/day.  If you experience ascites (fluid accumulation in the abdomen associated with severe liver damage / cirrhosis) please limit sodium intake to < 2000 mg a day    UNTIL YOU HAVE BEEN TREATED AND CURED:  Use condoms with all sexual encounters or practice abstinence to avoid sexual transmission   No sharing of razors, toothbrushes, nail clippers or anything that could potentially have blood on it.   If you cut yourself please clean and cover any wounds or open sores to others do not come into contact with your blood.   If blood spills onto item/surface please clean with 1:10 bleach solution and allow to dry, EVEN if it is dried blood.    GENERAL HELPFUL HINTS ON HCV THERAPY:  1. Stay well-hydrated.  2. Notify the ID Clinic of any changes in your other over-the-counter/herbal or prescription medications.  3. If you miss a dose of your  medication, take the missed dose as soon as you remember. Return to your regular time/dose schedule the next day.   4.  Do not stop taking your medications without first talking with your healthcare provider.  5.  You will see our pharmacist-specialist within the first 2 weeks of starting your medication to monitor for any possible side effects.  6.  You will have blood work once during treatment 4 weeks after your first pill. Again soon after treatment is completed and one  final lab 3 months after your last pill to ensure cure!   TIPS TO BE SUCCESSFUL WITH DAILY MEDICATION USE:  1. Set a reminder on your phone  2. Try filling out a pill box for the week - pick a day and put one pill for every day during the week so you know right away if you missed a pill.   3. Have a trusted family member ask you about your medications.   4. Smartphone app    For primary care services, please call one of the following clinics for a new patient appointment:   Each clinic have different programs to support your health care needs when you don't have access to health insurance.   1. Internal Medicine Clinic - ground floor of [facility]. [phone]  2. [facility] - [address].  [phone]  3. [facility]  - Near [facility] in [place]  [phone]  4. [facility] at [facility] - [address] [place],  [place]  [address]   [phone]

## 2021-07-05 LAB — LIVER FIBROSIS, FIBROTEST-ACTITEST
ALT: 41 U/L (ref 9–46)
Alpha-2-Macroglobulin: 344 mg/dL — ABNORMAL HIGH (ref 106–279)
Apolipoprotein A1: 161 mg/dL (ref 94–176)
Bilirubin: 0.5 mg/dL (ref 0.2–1.2)
Fibrosis Score: 0.86
GGT: 126 U/L — ABNORMAL HIGH (ref 3–95)
Haptoglobin: 11 mg/dL — ABNORMAL LOW (ref 43–212)
Necroinflammat ACT Score: 0.4
Reference ID: 4001206

## 2021-07-05 LAB — COMPLETE METABOLIC PANEL WITH GFR
AG Ratio: 1.2 (calc) (ref 1.0–2.5)
ALT: 38 U/L (ref 9–46)
AST: 44 U/L — ABNORMAL HIGH (ref 10–35)
Albumin: 4.3 g/dL (ref 3.6–5.1)
Alkaline phosphatase (APISO): 89 U/L (ref 35–144)
BUN: 8 mg/dL (ref 7–25)
CO2: 30 mmol/L (ref 20–32)
Calcium: 9.6 mg/dL (ref 8.6–10.3)
Chloride: 103 mmol/L (ref 98–110)
Creat: 0.97 mg/dL (ref 0.70–1.30)
Globulin: 3.7 g/dL (calc) (ref 1.9–3.7)
Glucose, Bld: 128 mg/dL — ABNORMAL HIGH (ref 65–99)
Potassium: 4 mmol/L (ref 3.5–5.3)
Sodium: 138 mmol/L (ref 135–146)
Total Bilirubin: 0.5 mg/dL (ref 0.2–1.2)
Total Protein: 8 g/dL (ref 6.1–8.1)
eGFR: 94 mL/min/{1.73_m2} (ref >=60)

## 2021-07-05 LAB — CBC
HCT: 48 % (ref 38.5–50.0)
Hemoglobin: 16.1 g/dL (ref 13.2–17.1)
MCH: 32.1 pg (ref 27.0–33.0)
MCHC: 33.5 g/dL (ref 32.0–36.0)
MCV: 95.6 fL (ref 80.0–100.0)
MPV: 10 fL (ref 7.5–12.5)
Platelets: 262 10*3/uL (ref 140–400)
RBC: 5.02 10*6/uL (ref 4.20–5.80)
RDW: 13.1 % (ref 11.0–15.0)
WBC: 4.2 10*3/uL (ref 3.8–10.8)

## 2021-07-05 LAB — PROTIME-INR
INR: 1
Prothrombin Time: 10.6 s (ref 9.0–11.5)

## 2021-07-05 LAB — HEPATITIS B SURFACE ANTIGEN: Hepatitis B Surface Ag: NONREACTIVE

## 2021-07-05 LAB — HEPATITIS C GENOTYPE

## 2021-07-05 LAB — HEPATITIS C RNA QUANTITATIVE
HCV Quantitative Log: 7.5 log IU/mL — ABNORMAL HIGH
HCV RNA, PCR, QN: 31800000 IU/mL — ABNORMAL HIGH

## 2021-07-05 LAB — HEPATITIS B SURFACE ANTIBODY, QUANTITATIVE: Hep B S AB Quant (Post): >1000 m[IU]/mL (ref >=10)

## 2021-07-07 ENCOUNTER — Telehealth: Payer: Self-pay | Admitting: Infectious Diseases

## 2021-07-07 DIAGNOSIS — B182 Chronic viral hepatitis C: Secondary | ICD-10-CM

## 2021-07-07 NOTE — Telephone Encounter (Signed)
Left non-specific voicemail to patient requesting call back to discuss results of recent tests.   In case he calls back -  F4 on fibrosure with very high Hep C viral load copies. Genotype 1b.  We need to arrange a liver ultrasound with elastograpy for Bruce [name] to make sure he does not have any findings concerning for cirrhosis and screening for tumors.

## 2021-07-14 ENCOUNTER — Telehealth: Payer: Self-pay

## 2021-07-14 NOTE — Telephone Encounter (Signed)
Called patient to review results. Home number does not list patient on voicemail, no voicemail left. Cell number does not have voicemail set up.   Sandie Ano, RN

## 2021-07-14 NOTE — Telephone Encounter (Signed)
Called patient to review results. Home number does not list patient on voicemail, RN did not leave message Cell number does not have voicemail set up.   Sandie Ano, RN

## 2021-07-15 NOTE — Telephone Encounter (Signed)
Attempted to call both numbers listed in chart, no answer and unable to leave voicemail.   Sandie Ano, RN

## 2021-07-16 NOTE — Telephone Encounter (Signed)
RN attempted to call again, phone call was answered by patient's significant other. She is currently at work and patient is at home without a phone. She will go home at lunch and have him call the office.  He will need schedule an ultrasound ('ccing Claris Che for this), will need results relayed by clinic. Andree Coss, RN

## 2021-07-20 NOTE — Telephone Encounter (Signed)
Patient advised of lab results and that he will need a liver US to check for cirrhosis and screen for tumors before starting treatment. Patient verbalized understanding and transferred to [facility] regarding getting him scheduled for [place]

## 2021-07-28 ENCOUNTER — Ambulatory Visit (HOSPITAL_COMMUNITY)
Admission: RE | Admit: 2021-07-28 | Discharge: 2021-07-28 | Disposition: A | Discharge status: Home / Self Care | Payer: Self-pay | Source: Ambulatory Visit | Attending: Infectious Diseases | Admitting: Infectious Diseases

## 2021-07-28 ENCOUNTER — Other Ambulatory Visit: Payer: Self-pay

## 2021-07-28 DIAGNOSIS — B182 Chronic viral hepatitis C: Secondary | ICD-10-CM | POA: Insufficient documentation

## 2021-08-05 ENCOUNTER — Telehealth: Payer: Self-pay

## 2021-08-05 NOTE — Telephone Encounter (Signed)
[facility] Patient Advocate Encounter  Completed and sent [employer] application for Mavyret for this patient who is uninsured.    Patient assistance phone number for follow up is [phone].   This encounter will be updated until final determination.   Jeston Junkins, CPhT Specialty Pharmacy Patient [facility] Phone: [phone] Fax:  [phone]  

## 2021-08-11 ENCOUNTER — Telehealth: Payer: Self-pay

## 2021-08-11 MED ORDER — MAVYRET 100-40 MG PO TABS: 3.0000 | ORAL_TABLET | Freq: Every day | ORAL | 1 refills | Status: DC

## 2021-08-11 NOTE — Telephone Encounter (Signed)
Patient called office to follow up on [place] results from 9/21. Has not started hep c medication. Would like a call back to review results also to see if he can begin Mavyret. Juanita Laster, RMA

## 2021-08-11 NOTE — Telephone Encounter (Signed)
LVM for Bruce [name] to return call on [phone]  His ultrasound shows some changes related to fatty liver disease. There are no findings that are concerning for any liver cancer which is great news!   He can certainly begin the mavyret at any time - take ALL THREE tabs together once daily with food.

## 2021-08-11 NOTE — Addendum Note (Signed)
Addended by: Blanchard Kelch on: 08/11/2021 12:02 PM   Modules accepted: Orders

## 2021-08-13 NOTE — Telephone Encounter (Signed)
Called patient to relay results from [place]. Does not have any questions regarding results. Understands he can start medication once he received them.  Lupita Leash will call pt once mediation is approved. Juanita Laster, RMA

## 2021-08-17 ENCOUNTER — Telehealth: Payer: Self-pay

## 2021-08-17 NOTE — Telephone Encounter (Signed)
[facility] Patient Advocate Encounter  Completed and sent [employer] application for Mavyret for this patient who is uninsured.    Patient is approved 08/17/21 through 02/15/22.  I will call patient to find out delivery date.   Clearance Coots, CPhT Specialty Pharmacy Patient [facility] for Infectious Disease Phone: [phone] Fax:  [phone]

## 2021-08-25 ENCOUNTER — Telehealth: Payer: Self-pay | Admitting: Pharmacist

## 2021-08-25 NOTE — Telephone Encounter (Signed)
Spoke with Steward Drone on the phone about [employer] which will be delivered tomorrow. Patient is approved to receive Mavyret x 8 weeks for chronic Hepatitis C infection. Counseled patient to take all three tablets of Mavyret daily with food.  Counseled patient the need to take all three tablets together and to not separate them out during the day. Encouraged patient not to miss any doses and explained how their chance of cure could go down with each dose missed. Counseled patient on what to do if dose is missed - if it is closer to the missed dose take immediately; if closer to next dose then skip dose and take the next dose at the usual time. Counseled patient on common side effects such as headache, fatigue, and nausea and that these normally decrease with time. I reviewed patient medications and found no drug interactions. Discussed with Steward Drone that there are several drug interactions with Mavyret and instructed patient to call the clinic if he wishes to start a new medication during course of therapy. Also advised to call if he experiences any side effects. Patient will follow-up with Cassie in the pharmacy clinic on 11/17.  Margarite Gouge, PharmD, CPP Clinical Pharmacist Practitioner Infectious Diseases Clinical Pharmacist [facility] for Infectious Disease

## 2021-08-26 ENCOUNTER — Telehealth: Payer: Self-pay | Admitting: Pharmacist

## 2021-08-26 NOTE — Telephone Encounter (Signed)
Patient's medication was delivered to clinic today and will need to picked up. Called both patient and patient's spouse and voice mailboxes were full. Will call back later.  Margarite Gouge, PharmD, CPP Clinical Pharmacist Practitioner Infectious Diseases Clinical Pharmacist [facility] for Infectious Disease

## 2021-08-31 NOTE — Progress Notes (Unsigned)
Patient came by the office at 4:55 pm to pick up Mavyret. Medication had patient name and DOB listed on box. Medication was given to patient. Patient wanted to know if he could have medication shipped to his home address. I informed him I would let our pharmacy team know.   Carmelite Violet Lesli Albee, CMA

## 2021-09-07 NOTE — Progress Notes (Signed)
He can have it delivered and request this when he alls for his refill. Thanks!

## 2021-09-08 ENCOUNTER — Telehealth: Payer: Self-pay | Admitting: Pharmacist

## 2021-09-08 NOTE — Telephone Encounter (Signed)
Patient's medication was delivered to clinic today and will need to be picked up.  Margarite Gouge, PharmD, CPP Clinical Pharmacist Practitioner Infectious Diseases Clinical Pharmacist [facility] for Infectious Disease

## 2021-09-09 ENCOUNTER — Ambulatory Visit: Payer: Self-pay | Admitting: Pharmacist

## 2021-09-23 ENCOUNTER — Ambulatory Visit: Payer: Self-pay | Admitting: Pharmacist

## 2021-10-12 ENCOUNTER — Other Ambulatory Visit: Payer: Self-pay

## 2021-10-12 DIAGNOSIS — F1410 Cocaine abuse, uncomplicated: Secondary | ICD-10-CM | POA: Diagnosis present

## 2021-10-12 DIAGNOSIS — Z20822 Contact with and (suspected) exposure to covid-19: Secondary | ICD-10-CM | POA: Diagnosis present

## 2021-10-12 DIAGNOSIS — F17210 Nicotine dependence, cigarettes, uncomplicated: Secondary | ICD-10-CM | POA: Diagnosis present

## 2021-10-12 DIAGNOSIS — R309 Painful micturition, unspecified: Secondary | ICD-10-CM | POA: Diagnosis present

## 2021-10-12 DIAGNOSIS — R300 Dysuria: Secondary | ICD-10-CM | POA: Diagnosis present

## 2021-10-12 DIAGNOSIS — Z813 Family history of other psychoactive substance abuse and dependence: Secondary | ICD-10-CM

## 2021-10-12 DIAGNOSIS — Y900 Blood alcohol level of less than 20 mg/100 ml: Secondary | ICD-10-CM | POA: Diagnosis present

## 2021-10-12 DIAGNOSIS — B182 Chronic viral hepatitis C: Secondary | ICD-10-CM | POA: Diagnosis present

## 2021-10-12 DIAGNOSIS — Z880 Allergy status to penicillin: Secondary | ICD-10-CM

## 2021-10-12 DIAGNOSIS — N433 Hydrocele, unspecified: Secondary | ICD-10-CM | POA: Diagnosis present

## 2021-10-12 DIAGNOSIS — Z79899 Other long term (current) drug therapy: Secondary | ICD-10-CM

## 2021-10-12 DIAGNOSIS — K852 Alcohol induced acute pancreatitis without necrosis or infection: Principal | ICD-10-CM | POA: Diagnosis present

## 2021-10-12 DIAGNOSIS — I10 Essential (primary) hypertension: Secondary | ICD-10-CM | POA: Diagnosis present

## 2021-10-12 DIAGNOSIS — F1020 Alcohol dependence, uncomplicated: Secondary | ICD-10-CM | POA: Diagnosis present

## 2021-10-12 LAB — COMPREHENSIVE METABOLIC PANEL
ALT: 32 U/L (ref 0–44)
AST: 55 U/L — ABNORMAL HIGH (ref 15–41)
Albumin: 4.4 g/dL (ref 3.5–5.0)
Alkaline Phosphatase: 87 U/L (ref 38–126)
Anion gap: 10 (ref 5–15)
BUN: 10 mg/dL (ref 6–20)
CO2: 25 mmol/L (ref 22–32)
Calcium: 9.6 mg/dL (ref 8.9–10.3)
Chloride: 103 mmol/L (ref 98–111)
Creatinine, Ser: 0.72 mg/dL (ref 0.61–1.24)
GFR, Estimated: >60 mL/min (ref >60)
Glucose, Bld: 114 mg/dL — ABNORMAL HIGH (ref 70–99)
Potassium: 3.7 mmol/L (ref 3.5–5.1)
Sodium: 138 mmol/L (ref 135–145)
Total Bilirubin: 0.9 mg/dL (ref 0.3–1.2)
Total Protein: 8.4 g/dL — ABNORMAL HIGH (ref 6.5–8.1)

## 2021-10-12 LAB — LIPASE, BLOOD: Lipase: 118 U/L — ABNORMAL HIGH (ref 11–51)

## 2021-10-12 LAB — CBC
HCT: 46.8 % (ref 39.0–52.0)
Hemoglobin: 16.5 g/dL (ref 13.0–17.0)
MCH: 31.5 pg (ref 26.0–34.0)
MCHC: 35.3 g/dL (ref 30.0–36.0)
MCV: 89.3 fL (ref 80.0–100.0)
Platelets: 219 10*3/uL (ref 150–400)
RBC: 5.24 MIL/uL (ref 4.22–5.81)
RDW: 11.9 % (ref 11.5–15.5)
WBC: 5.8 10*3/uL (ref 4.0–10.5)
nRBC: 0 % (ref 0.0–0.2)

## 2021-10-12 LAB — ETHANOL: Alcohol, Ethyl (B): <10 mg/dL (ref <10)

## 2021-10-12 NOTE — ED Triage Notes (Signed)
Pt reports that he developed RUQ pain today with Nausea and vomiting. He states "its probably my pancreas". Pts mother reports that pt is a drinker and is hard headed.

## 2021-10-13 ENCOUNTER — Inpatient Hospital Stay: Payer: Self-pay

## 2021-10-13 ENCOUNTER — Inpatient Hospital Stay
Admission: EM | Admit: 2021-10-13 | Discharge: 2021-10-18 | DRG: 440 | Disposition: A | Discharge status: Home / Self Care | Payer: Self-pay | Attending: Student | Admitting: Student

## 2021-10-13 DIAGNOSIS — F1010 Alcohol abuse, uncomplicated: Secondary | ICD-10-CM

## 2021-10-13 DIAGNOSIS — K852 Alcohol induced acute pancreatitis without necrosis or infection: Secondary | ICD-10-CM

## 2021-10-13 DIAGNOSIS — B182 Chronic viral hepatitis C: Secondary | ICD-10-CM

## 2021-10-13 DIAGNOSIS — I10 Essential (primary) hypertension: Secondary | ICD-10-CM

## 2021-10-13 DIAGNOSIS — K859 Acute pancreatitis without necrosis or infection, unspecified: Secondary | ICD-10-CM | POA: Diagnosis present

## 2021-10-13 LAB — COMPREHENSIVE METABOLIC PANEL
ALT: 28 U/L (ref 0–44)
AST: 51 U/L — ABNORMAL HIGH (ref 15–41)
Albumin: 4.3 g/dL (ref 3.5–5.0)
Alkaline Phosphatase: 92 U/L (ref 38–126)
Anion gap: 7 (ref 5–15)
BUN: 10 mg/dL (ref 6–20)
CO2: 26 mmol/L (ref 22–32)
Calcium: 9.4 mg/dL (ref 8.9–10.3)
Chloride: 106 mmol/L (ref 98–111)
Creatinine, Ser: 0.68 mg/dL (ref 0.61–1.24)
GFR, Estimated: >60 mL/min (ref >60)
Glucose, Bld: 138 mg/dL — ABNORMAL HIGH (ref 70–99)
Potassium: 3.9 mmol/L (ref 3.5–5.1)
Sodium: 139 mmol/L (ref 135–145)
Total Bilirubin: 1 mg/dL (ref 0.3–1.2)
Total Protein: 8.3 g/dL — ABNORMAL HIGH (ref 6.5–8.1)

## 2021-10-13 LAB — URINALYSIS, MICROSCOPIC (REFLEX): Bacteria, UA: NONE SEEN

## 2021-10-13 LAB — CBC
HCT: 44.5 % (ref 39.0–52.0)
Hemoglobin: 15.7 g/dL (ref 13.0–17.0)
MCH: 31.7 pg (ref 26.0–34.0)
MCHC: 35.3 g/dL (ref 30.0–36.0)
MCV: 89.9 fL (ref 80.0–100.0)
Platelets: 204 10*3/uL (ref 150–400)
RBC: 4.95 MIL/uL (ref 4.22–5.81)
RDW: 11.9 % (ref 11.5–15.5)
WBC: 5.3 10*3/uL (ref 4.0–10.5)
nRBC: 0 % (ref 0.0–0.2)

## 2021-10-13 LAB — URINALYSIS, ROUTINE W REFLEX MICROSCOPIC
Bilirubin Urine: NEGATIVE
Glucose, UA: NEGATIVE mg/dL
Hgb urine dipstick: NEGATIVE
Nitrite: NEGATIVE
Specific Gravity, Urine: 1.025 (ref 1.005–1.030)
pH: 6 (ref 5.0–8.0)

## 2021-10-13 LAB — RESP PANEL BY RT-PCR (FLU A&B, COVID) ARPGX2
Influenza A by PCR: NEGATIVE
Influenza B by PCR: NEGATIVE
SARS Coronavirus 2 by RT PCR: NEGATIVE

## 2021-10-13 LAB — LIPASE, BLOOD: Lipase: 173 U/L — ABNORMAL HIGH (ref 11–51)

## 2021-10-13 LAB — MAGNESIUM: Magnesium: 1.8 mg/dL (ref 1.7–2.4)

## 2021-10-13 MED ORDER — SODIUM CHLORIDE 0.9 % IV SOLN: INTRAVENOUS | Status: DC

## 2021-10-13 MED ORDER — MAGNESIUM HYDROXIDE 400 MG/5ML PO SUSP
30.0000 mL | Freq: Every day | ORAL | Status: DC | PRN
  Administered 2021-10-14 – 2021-10-16 (×2): 30 mL via ORAL
  Filled 2021-10-13 (×2): qty 30

## 2021-10-13 MED ORDER — ONDANSETRON HCL 4 MG/2ML IJ SOLN
4.0000 mg | Freq: Four times a day (QID) | INTRAMUSCULAR | Status: DC | PRN
  Administered 2021-10-13: 4 mg via INTRAVENOUS
  Filled 2021-10-13 (×6): qty 2

## 2021-10-13 MED ORDER — PANTOPRAZOLE SODIUM 40 MG IV SOLR
40.0000 mg | Freq: Two times a day (BID) | INTRAVENOUS | Status: DC
  Administered 2021-10-13 – 2021-10-16 (×7): 40 mg via INTRAVENOUS
  Filled 2021-10-13 (×7): qty 40

## 2021-10-13 MED ORDER — ACETAMINOPHEN 650 MG RE SUPP: 650.0000 mg | Freq: Four times a day (QID) | RECTAL | Status: DC | PRN

## 2021-10-13 MED ORDER — HYDROMORPHONE HCL 1 MG/ML IJ SOLN
1.0000 mg | INTRAMUSCULAR | Status: DC | PRN
  Administered 2021-10-13 – 2021-10-15 (×24): 1 mg via INTRAVENOUS
  Filled 2021-10-13 (×24): qty 1

## 2021-10-13 MED ORDER — ACETAMINOPHEN 325 MG PO TABS
650.0000 mg | ORAL_TABLET | Freq: Four times a day (QID) | ORAL | Status: DC | PRN
  Filled 2021-10-13: qty 2

## 2021-10-13 MED ORDER — THIAMINE HCL 100 MG/ML IJ SOLN
Freq: Once | INTRAVENOUS | Status: AC
  Filled 2021-10-13: qty 1000

## 2021-10-13 MED ORDER — MORPHINE SULFATE (PF) 2 MG/ML IV SOLN
2.0000 mg | INTRAVENOUS | Status: DC | PRN
  Administered 2021-10-13: 2 mg via INTRAVENOUS
  Filled 2021-10-13: qty 1

## 2021-10-13 MED ORDER — ONDANSETRON HCL 4 MG PO TABS: 4.0000 mg | ORAL_TABLET | Freq: Four times a day (QID) | ORAL | Status: DC | PRN

## 2021-10-13 MED ORDER — DILTIAZEM HCL ER COATED BEADS 120 MG PO CP24
120.0000 mg | ORAL_CAPSULE | Freq: Every day | ORAL | Status: DC
  Administered 2021-10-13 – 2021-10-18 (×6): 120 mg via ORAL
  Filled 2021-10-13 (×7): qty 1

## 2021-10-13 MED ORDER — GLECAPREVIR-PIBRENTASVIR 100-40 MG PO TABS: 3.0000 | ORAL_TABLET | Freq: Every day | ORAL | Status: DC

## 2021-10-13 MED ORDER — KETOROLAC TROMETHAMINE 30 MG/ML IJ SOLN
INTRAMUSCULAR | Status: AC
  Administered 2021-10-13: 15 mg via INTRAVENOUS
  Filled 2021-10-13: qty 1

## 2021-10-13 MED ORDER — SODIUM CHLORIDE 0.9 % IV BOLUS (SEPSIS)
1000.0000 mL | Freq: Once | INTRAVENOUS | Status: AC
  Administered 2021-10-13: 1000 mL via INTRAVENOUS

## 2021-10-13 MED ORDER — DIPHENHYDRAMINE HCL 50 MG/ML IJ SOLN
12.5000 mg | Freq: Four times a day (QID) | INTRAMUSCULAR | Status: DC | PRN
  Administered 2021-10-13 – 2021-10-18 (×16): 12.5 mg via INTRAVENOUS
  Filled 2021-10-13 (×16): qty 1

## 2021-10-13 MED ORDER — KETOROLAC TROMETHAMINE 30 MG/ML IJ SOLN
15.0000 mg | Freq: Four times a day (QID) | INTRAMUSCULAR | Status: DC | PRN
  Administered 2021-10-14: 15 mg via INTRAVENOUS
  Filled 2021-10-13 (×2): qty 1

## 2021-10-13 MED ORDER — ONDANSETRON HCL 4 MG/2ML IJ SOLN
4.0000 mg | Freq: Four times a day (QID) | INTRAMUSCULAR | Status: DC | PRN
  Administered 2021-10-13 – 2021-10-17 (×14): 4 mg via INTRAVENOUS
  Filled 2021-10-13 (×9): qty 2

## 2021-10-13 MED ORDER — ENOXAPARIN SODIUM 40 MG/0.4ML IJ SOSY
40.0000 mg | PREFILLED_SYRINGE | INTRAMUSCULAR | Status: DC
  Administered 2021-10-13 – 2021-10-18 (×6): 40 mg via SUBCUTANEOUS
  Filled 2021-10-13 (×6): qty 0.4

## 2021-10-13 MED ORDER — LORAZEPAM 2 MG/ML IJ SOLN: 1.0000 mg | INTRAMUSCULAR | Status: DC | PRN

## 2021-10-13 NOTE — ED Provider Notes (Signed)
[facility] Emergency Department Provider Note  ____________________________________________   Event Date/Time   First MD Initiated Contact with Patient 10/13/21 0206     (approximate)  I have reviewed the triage vital signs and the nursing notes.   HISTORY  Chief Complaint Abdominal Pain, Nausea, and Emesis    HPI Bruce [name] is a 50 y.o. male with history of paroxysmal atrial fibrillation, polysubstance abuse, hepatitis C who presents to the emergency department with family for concerns for severe upper abdominal pain and vomiting that started today.  Denies fevers, chest pain, shortness of breath, diarrhea.  Feels like his previous episodes of pancreatitis.  Family at bedside reports that he drinks alcohol every day.  Has been admitted to the hospital 6 times between 2020 and [id number] for alcoholic pancreatitis.        Past Medical History:  Diagnosis Date   A-fib [facility])    Dental caries    Hepatitis C infection    Iritis    PAF (paroxysmal atrial fibrillation) ([place])    a. diagnosed 03/19/18; b. CHADS2VASc 0   Polysubstance abuse ([place])    a. cocaine, tobacco, etoh   Scrotal mass    Tick borne fever    Uveitis     Patient Active Problem List   Diagnosis Date Noted   Underinsured 06/29/2021   BPH (benign prostatic hyperplasia) 12/23/2020   Atrial fibrillation with RVR ([place]) 12/21/2020   Alcohol-induced pancreatitis 09/28/2020   Recurrent pancreatitis 06/29/2020   Alcohol induced acute pancreatitis without necrosis or infection 05/03/2020   Polysubstance abuse ([place])    Alcohol abuse    Acute pancreatitis 01/12/2020   Thrombocytopenia ([place]) 06/25/2019   Elevated troponin    Pancreatitis 06/23/2019   AF (paroxysmal atrial fibrillation) ([place]) 09/13/2018   Atrial fibrillation ([place]) 03/19/2018   Abnormality of gait 02/12/2013   Scrotal mass 05/22/2012   Dental caries 05/22/2012   Hepatitis C infection 05/16/2012   Arthralgia 05/06/2012    Neutropenia, unspecified ([place]) 05/01/2012   Spermatocele of epididymis 05/01/2012   Bilateral varicoceles 05/01/2012   Bilateral hydrocele 05/01/2012    Past Surgical History:  Procedure Laterality Date   FINGER SURGERY     infection, middle left   fractured jaw     SHOULDER SURGERY     right    Prior to Admission medications   Medication Sig Start Date End Date Taking? Authorizing Provider  diltiazem (CARDIZEM CD) 120 MG 24 hr capsule Take 1 capsule (120 mg total) by mouth daily. 06/09/21 10/13/21 Yes Furth, Cadence H, PA-C  Glecaprevir-Pibrentasvir (MAVYRET) 100-40 MG TABS Take 3 tablets by mouth daily with breakfast. 08/11/21  Yes Gilliam Callas, NP  Cyanocobalamin (B-12 PO) Take by mouth as needed. Patient not taking: Reported on 10/13/2021    [provider]    Allergies Penicillins  Family History  Problem Relation Age of Onset   Drug abuse Father        throat cancer    Social History Social History   Tobacco Use   Smoking status: Some Days    Packs/day: 0.25    Types: Cigarettes   Smokeless tobacco: Never   Tobacco comments:    Per pt  " I only smoke cigarettes when I drink beer"  Vaping Use   Vaping Use: Never used  Substance Use Topics   Alcohol use: Yes    Comment: 6 pack per week   Drug use: Yes    Types: IV, Cocaine  Comment: Pt states that he last used "a while back"    Review of Systems Level 5 caveat secondary to patient's poor cooperation and pain level  ____________________________________________   PHYSICAL EXAM:  VITAL SIGNS: ED Triage Vitals  Enc Vitals Group     BP 10/12/21 1935 (!) 145/96     Pulse Rate 10/12/21 1935 67     Resp 10/12/21 1935 20     Temp 10/12/21 1935 98.3 F (36.8 C)     Temp Source 10/12/21 1935 Oral     SpO2 10/12/21 1935 96 %     Weight 10/12/21 1933 165 lb (74.8 kg)     Height 10/12/21 1931 5\' 7"  (1.702 m)     Head Circumference --      Peak Flow --      Pain Score 10/12/21 1932 10      Pain Loc --      Pain Edu? --      Excl. in [place]? --    CONSTITUTIONAL: Alert but not answering questions appropriately.  Moaning in pain and dry heaving. HEAD: Normocephalic EYES: Conjunctivae clear, pupils appear equal, EOM appear intact ENT: normal nose; moist mucous membranes NECK: Supple, normal ROM CARD: RRR; S1 and S2 appreciated; no murmurs, no clicks, no rubs, no gallops RESP: Normal chest excursion without splinting or tachypnea; breath sounds clear and equal bilaterally; no wheezes, no rhonchi, no rales, no hypoxia or respiratory distress, speaking full sentences ABD/GI: Normal bowel sounds; non-distended; soft, diffusely tender throughout the upper abdomen without guarding or rebound, no peritoneal signs BACK: The back appears normal EXT: Normal ROM in all joints; no deformity noted, no edema; no cyanosis SKIN: Normal color for age and race; warm; no rash on exposed skin NEURO: Moves all extremities equally PSYCH: The patient's mood and manner are appropriate.  ____________________________________________   LABS (all labs ordered are listed, but only abnormal results are displayed)  Labs Reviewed  LIPASE, BLOOD - Abnormal; Notable for the following components:      Result Value   Lipase 118 (*)    All other components within normal limits  COMPREHENSIVE METABOLIC PANEL - Abnormal; Notable for the following components:   Glucose, Bld 114 (*)    Total Protein 8.4 (*)    AST 55 (*)    All other components within normal limits  RESP PANEL BY RT-PCR (FLU A&B, COVID) ARPGX2  CBC  ETHANOL  MAGNESIUM  URINALYSIS, ROUTINE W REFLEX MICROSCOPIC  HIV ANTIBODY (ROUTINE TESTING W REFLEX)  COMPREHENSIVE METABOLIC PANEL  CBC  LIPASE, BLOOD   ____________________________________________  EKG   ____________________________________________  RADIOLOGY I, Cara Thaxton, personally viewed and evaluated these images (plain radiographs) as part of my medical decision making, as  well as reviewing the written report by the radiologist.  ED MD interpretation:    Official radiology report(s): No results found.  ____________________________________________   PROCEDURES  Procedure(s) performed (including Critical Care):  Procedures    ____________________________________________   INITIAL IMPRESSION / ASSESSMENT AND PLAN / ED COURSE  As part of my medical decision making, I reviewed the following data within the [place] History obtained from family, Nursing notes reviewed and incorporated, Labs reviewed , Old chart reviewed, Discussed with admitting physician , and Notes from prior ED visits         Patient here with recurrent pancreatitis from alcohol abuse.  Abdominal exam suggestive of the same.  Lipase elevated to 118.  LFTs otherwise unremarkable other than mild elevation  of his AST.  No history of gallstone pancreatitis.  No fevers.  White blood cell count normal.  Given patient's level of pain, will give Dilaudid, Zofran and IV fluids.  Patient and family would prefer admission given history of pain that has been difficult to control previously.  Will discuss with hospitalist.  ED PROGRESS  2:00 AM  Discussed patient's case with hospitalist, Dr. Arville Care.  I have recommended admission and patient (and family if present) agree with this plan. Admitting physician will place admission orders.   I reviewed all nursing notes, vitals, pertinent previous records and reviewed/interpreted all EKGs, lab and urine results, imaging (as available).  ____________________________________________   FINAL CLINICAL IMPRESSION(S) / ED DIAGNOSES  Final diagnoses:  Alcohol-induced acute pancreatitis without infection or necrosis     ED Discharge Orders     None       *Please note:  Bruce [name] was evaluated in Emergency Department on 10/13/2021 for the symptoms described in the history of present illness. He was evaluated in the  context of the global COVID-19 pandemic, which necessitated consideration that the patient might be at risk for infection with the SARS-CoV-2 virus that causes COVID-19. Institutional protocols and algorithms that pertain to the evaluation of patients at risk for COVID-19 are in a state of rapid change based on information released by regulatory bodies including the CDC and federal and state organizations. These policies and algorithms were followed during the patient's care in the ED.  Some ED evaluations and interventions may be delayed as a result of limited staffing during and the pandemic.*   Note:  This document was prepared using Dragon voice recognition software and may include unintentional dictation errors.    Aycen Porreca, Layla Maw, DO 10/13/21 [phone]

## 2021-10-13 NOTE — H&P (Addendum)
[place]   PATIENT NAME: Bruce [name]    MR#:  [id number]  DATE OF BIRTH:  1971-06-10  DATE OF ADMISSION:  10/13/2021  PRIMARY CARE PHYSICIAN: Patient, No Pcp Per (Inactive)   Patient is coming from: Home  REQUESTING/REFERRING PHYSICIAN: Ward, Baxter Hire, DO CHIEF COMPLAINT:   Chief Complaint  Patient presents with   Abdominal Pain   Nausea   Emesis    HISTORY OF PRESENT ILLNESS:  Bruce [name] is a 50 y.o. African-American male with medical history significant for atrial fibrillation, polysubstance abuse and hepatitis C, who presented to the ER with a [place] of upper abdominal pain mainly in the right upper quadrant with associated nausea and vomiting since yesterday morning.  He denies any fever or chills.  His last alcoholic drink was last night no cough or wheezing or dyspnea.  No chest pain or palpitations.  No dysuria, oliguria, urinary frequency or urgency or flank pain.  ED Course: When he came to the ER blood pressure was 145/96 with otherwise normal vital signs.  Labs revealed serum lipase level 118 and AST 55 with total protein of 8.4 and otherwise unremarkable CMP.  CBC was within normal.  Imaging: Abdominal pelvic CT scan is pending.  The patient was given 1 mg of IV Dilaudid and 4 mg IV Zofran as well as 1 L bolus of IV normal saline 125 mL/h.  He will be admitted to a medical bed for further evaluation and management. PAST MEDICAL HISTORY:   Past Medical History:  Diagnosis Date   A-fib [facility])    Dental caries    Hepatitis C infection    Iritis    PAF (paroxysmal atrial fibrillation) ([facility])    a. diagnosed 03/19/18; b. CHADS2VASc 0   Polysubstance abuse ([facility])    a. cocaine, tobacco, etoh   Scrotal mass    Tick borne fever    Uveitis     PAST SURGICAL HISTORY:   Past Surgical History:  Procedure Laterality Date   FINGER SURGERY     infection, middle left   fractured jaw     SHOULDER SURGERY     right    SOCIAL HISTORY:   Social  History   Tobacco Use   Smoking status: Some Days    Packs/day: 0.25    Types: Cigarettes   Smokeless tobacco: Never   Tobacco comments:    Per pt  " I only smoke cigarettes when I drink beer"  Substance Use Topics   Alcohol use: Yes    Comment: 6 pack per week    FAMILY HISTORY:   Family History  Problem Relation Age of Onset   Drug abuse Father        throat cancer    DRUG ALLERGIES:   Allergies  Allergen Reactions   Penicillins Other (See Comments)    Did it involve swelling of the face/tongue/throat, SOB, or low BP? No Did it involve sudden or severe rash/hives, skin peeling, or any reaction on the inside of your mouth or nose? No Did you need to seek medical attention at a hospital or doctor's office? No When did it last happen?       If all above answers are "NO", may proceed with cephalosporin use.    REVIEW OF SYSTEMS:   ROS As per history of present illness. All pertinent systems were reviewed above. Constitutional, HEENT, cardiovascular, respiratory, GI, GU, musculoskeletal, neuro, psychiatric, endocrine, integumentary and hematologic systems were reviewed and are  otherwise negative/unremarkable except for positive findings mentioned above in the HPI.   MEDICATIONS AT HOME:   Prior to Admission medications   Medication Sig Start Date End Date Taking? Authorizing Provider  diltiazem (CARDIZEM CD) 120 MG 24 hr capsule Take 1 capsule (120 mg total) by mouth daily. 06/09/21 10/13/21 Yes Furth, Cadence H, PA-C  Glecaprevir-Pibrentasvir (MAVYRET) 100-40 MG TABS Take 3 tablets by mouth daily with breakfast. 08/11/21  Yes [place] Callas, NP  Cyanocobalamin (B-12 PO) Take by mouth as needed. Patient not taking: Reported on 10/13/2021    [provider]      VITAL SIGNS:  Blood pressure (!) 154/99, pulse 62, temperature 98.3 F (36.8 C), temperature source Oral, resp. rate 18, height 5\' 7"  (1.702 m), weight 74.8 kg, SpO2 96 %.  PHYSICAL EXAMINATION:   Physical Exam  GENERAL:  50 y.o.-year-old African-American male patient lying in the bed with moderate distress due to recurrent upper abdominal pain. EYES: Pupils equal, round, reactive to light and accommodation. No scleral icterus. Extraocular muscles intact.  HEENT: Head atraumatic, normocephalic. Oropharynx and nasopharynx clear.  NECK:  Supple, no jugular venous distention. No thyroid enlargement, no tenderness.  LUNGS: Normal breath sounds bilaterally, no wheezing, rales,rhonchi or crepitation. No use of accessory muscles of respiration.  CARDIOVASCULAR: Regular rate and rhythm, S1, S2 normal. No murmurs, rubs, or gallops.  ABDOMEN: Soft, nondistended, with right upper quadrant, epigastric and left upper quadrant tenderness without rebound tenderness guarding or rigidity.  Bowel sounds present. No organomegaly or mass.  EXTREMITIES: No pedal edema, cyanosis, or clubbing.  NEUROLOGIC: Cranial nerves [name] through XII are intact. Muscle strength 5/5 in all extremities. Sensation intact. Gait not checked.  PSYCHIATRIC: The patient is alert and oriented x 3.  Normal affect and good eye contact. SKIN: No obvious rash, lesion, or ulcer.   LABORATORY PANEL:   CBC Recent Labs  Lab 10/12/21 1933  WBC 5.8  HGB 16.5  HCT 46.8  PLT 219   ------------------------------------------------------------------------------------------------------------------  Chemistries  Recent Labs  Lab 10/12/21 1933  NA 138  K 3.7  CL 103  CO2 25  GLUCOSE 114*  BUN 10  CREATININE 0.72  CALCIUM 9.6  MG 1.8  AST 55*  ALT 32  ALKPHOS 87  BILITOT 0.9   ------------------------------------------------------------------------------------------------------------------  Cardiac Enzymes No results for input(s): TROPONINI in the last 168 hours. ------------------------------------------------------------------------------------------------------------------  RADIOLOGY:  No results  found.    IMPRESSION AND PLAN:  Principal Problem:   Acute pancreatitis  1.  Acute alcoholic pancreatitis. - The patient will be admitted to a medical bed. - He will be kept NPO. - Pain management will be provided. - We will follow serial lipase levels. - We will obtain abdominal pelvic CT scan.  2.  Alcohol abuse. - I counseled him for cessation and he will receive further counseling here.  3.  Essential hypertension. - We will continue Cardizem CD.  4.  Chronic hepatitis C. - We will continue Mavyret.  DVT prophylaxis: Lovenox. Code Status: full code. Family Communication:  The plan of care was discussed in details with the patient (and family). I answered all questions. The patient agreed to proceed with the above mentioned plan. Further management will depend upon hospital course. Disposition Plan: Back to previous home environment Consults called: none. All the records are reviewed and case discussed with ED provider.  Status is: Inpatient   Remains inpatient appropriate because:Ongoing diagnostic testing needed not appropriate for outpatient work up, Unsafe d/c plan, IV treatments appropriate  due to intensity of illness or inability to take PO, and Inpatient level of care appropriate due to severity of illness   Dispo: The patient is from: Home              Anticipated d/c is to: Home              Patient currently is not medically stable to d/c.              Difficult to place patient: No  TOTAL TIME TAKING CARE OF THIS PATIENT: 55 minutes.     Christel Mormon M.D on 10/13/2021 at 2:16 AM  [facility]   From 7 PM-7 AM, contact night-coverage [email or web address]  CC: Primary care physician; Patient, No Pcp Per (Inactive)

## 2021-10-13 NOTE — Progress Notes (Signed)
PROGRESS NOTE  Bruce [name]  DOB: 05-04-1971  PCP: Patient, No Pcp Per (Inactive) [id number]  DOA: 10/13/2021  LOS: 0 days  Hospital Day: 1  Chief Complaint  Patient presents with   Abdominal Pain   Nausea   Emesis    Brief narrative: Bruce [name] is a 50 y.o. male with PMH significant for atrial fibrillation, chronic alcoholic, chronic smoker, cocaine user and with chronic hepatitis C, ?history of A. fib Patient presented to the ED with complaint of right upper quadrant pain, nausea, vomiting. In the ED, patient was afebrile, heart rate 67, blood pressure 145/96. Labs with unremarkable CBC, chemistry with lipase elevated 218, slightly elevated AST. CT abdomen and pelvis showed acute edematous pancreatitis without collection. Patient was started on conservative management and admitted to hospitalist service.   Subjective: Patient was seen and examined this morning. Middle-aged African-American male.  Squatting up in the bed trying to urinate.  States that he is not having a lot of pain while urinating. Per nursing staff, bladder scan done this morning did not show any urine. Patient denies having any blood clots or stone passing in the urine.  No history of BPH or LUTS prior to this presentation. Patient states he quit alcohol but relapsed lately after a family tragedy.  Assessment/Plan: Acute alcoholic pancreatitis -Presented with acute abdominal pain, nausea, vomiting in the setting of chronic alcoholism. -Elevated lipase.  CT abdomen showed acute edematous pancreatitis without collection.  No significant hepatobiliary finding. -Currently on conservative management for acute pancreatitis.  Continue IV fluid, IV Dilaudid, IV Zofran.  Also added PRN IV Benadryl at patient's request as pain medicine gives him itching. -May also have underlying alcoholic gastritis.  We will add Protonix. Recent Labs  Lab 10/12/21 1933 10/13/21 0427  LIPASE 118* 173*   Painful  urination -This morning, patient did squatting up in the bed trying to urinate.  States that he is not having a lot of pain while urinating. Per nursing staff, bladder scan done this morning did not show any urine. Patient denies having any blood clots or stone passing in the urine.  No history of BPH or LUTS prior to this presentation. -Urinalysis on admission showed clear yellow urine with trace amount of leukocytes, no bacteria -CT abdomen pelvis showed bilateral hydrocele but no mention of any prostatic finding. -Continue to monitor symptoms for now.  No evidence of prostatitis at this time to start on antibiotics.  Chronic alcoholism Elevated liver enzymes -At risk of withdrawal.  CIWA protocol.  Counseled to quit. -Monitor liver enzymes. Recent Labs  Lab 10/12/21 1933 10/13/21 0427  AST 55* 51*  ALT 32 28  ALKPHOS 87 92  BILITOT 0.9 1.0  PROT 8.4* 8.3*  ALBUMIN 4.4 4.3   Polysubstance abuse (smoking, alcohol, cocaine) -Counseled to quit.  Chronic hepatitis C -continue Mavyret.  Mobility: Encourage ambulation Living condition: Lives at home Goals of care:   Code Status: Full Code  Nutritional status: Body mass index is 25.84 kg/m.      Diet:  Diet Order             Diet NPO time specified Except for: Sips with Meds  Diet effective now                  DVT prophylaxis:  enoxaparin (LOVENOX) injection 40 mg Start: 10/13/21 0800   Antimicrobials: None Fluid: NS 125 mL/h Consultants: None Family Communication: None at bedside  Status is: Inpatient  Continue in-hospital  care because: Needs to continue conservative management for pancreatitis Level of care: Med-Surg   Dispo: The patient is from: Home              Anticipated d/c is to: Home in 2 to 3 days              Patient currently is not medically stable to d/c.   Difficult to place patient No     Infusions:   sodium chloride 125 mL/hr at 10/13/21 0300   sodium chloride      Scheduled  Meds:  diltiazem  120 mg Oral Daily   enoxaparin (LOVENOX) injection  40 mg Subcutaneous Q24H   Glecaprevir-Pibrentasvir  3 tablet Oral Q breakfast   pantoprazole (PROTONIX) IV  40 mg Intravenous Q12H    PRN meds: acetaminophen **OR** acetaminophen, diphenhydrAMINE, HYDROmorphone (DILAUDID) injection, ketorolac, LORazepam, magnesium hydroxide, morphine injection, ondansetron (ZOFRAN) IV, ondansetron **OR** ondansetron (ZOFRAN) IV   Antimicrobials: Anti-infectives (From admission, onward)    Start     Dose/Rate Route Frequency Ordered Stop   10/13/21 0800  Glecaprevir-Pibrentasvir 100-40 MG TABS 3 tablet        3 tablet Oral Daily with breakfast 10/13/21 0216         Objective: Vitals:   10/13/21 0554 10/13/21 0920  BP: (!) 146/93 (!) 143/91  Pulse: 61 (!) 55  Resp: 17 16  Temp: 98.3 F (36.8 C)   SpO2: 96% 98%    Intake/Output Summary (Last 24 hours) at 10/13/2021 1108 Last data filed at 10/13/2021 0235 Gross per 24 hour  Intake 1000 ml  Output --  Net 1000 ml   Filed Weights   10/12/21 1933  Weight: 74.8 kg   Weight change:  Body mass index is 25.84 kg/m.   Physical Exam: General exam: Pleasant, middle-aged African-American male.  Mild to moderate distress because of abdominal pain and painful urination Skin: No rashes, lesions or ulcers. HEENT: Atraumatic, normocephalic, no obvious bleeding Lungs: Clear to auscultation bilaterally CVS: Regular rate and rhythm, no murmur GI/Abd soft, mild to moderate tenderness in epigastrium, nondistended, bowel sound present CNS: Alert, awake, oriented x3 Psychiatry: Mood appropriate Extremities: No pedal edema, no calf tenderness  Data Review: I have personally reviewed the laboratory data and studies available.  F/u labs ordered Unresulted Labs (From admission, onward)     Start     Ordered   10/14/21 0500  CBC with Differential/Platelet  Daily,   STAT      10/13/21 1108   10/14/21 0500  Comprehensive metabolic  panel  Daily,   STAT      10/13/21 1108   10/14/21 0500  Lipase, blood  Daily,   STAT      10/13/21 1108   10/13/21 0530  HIV Antibody (routine testing w rflx)  Once,   R        10/13/21 0530            Signed, Lorin Glass, MD [facility] 10/13/2021

## 2021-10-14 ENCOUNTER — Encounter: Payer: Self-pay | Admitting: Family Medicine

## 2021-10-14 LAB — CBC WITH DIFFERENTIAL/PLATELET
Abs Immature Granulocytes: 0.02 10*3/uL (ref 0.00–0.07)
Basophils Absolute: 0 10*3/uL (ref 0.0–0.1)
Basophils Relative: 0 %
Eosinophils Absolute: 0 10*3/uL (ref 0.0–0.5)
Eosinophils Relative: 0 %
HCT: 45.1 % (ref 39.0–52.0)
Hemoglobin: 15.6 g/dL (ref 13.0–17.0)
Immature Granulocytes: 0 %
Lymphocytes Relative: 20 %
Lymphs Abs: 1.4 10*3/uL (ref 0.7–4.0)
MCH: 31.6 pg (ref 26.0–34.0)
MCHC: 34.6 g/dL (ref 30.0–36.0)
MCV: 91.3 fL (ref 80.0–100.0)
Monocytes Absolute: 0.6 10*3/uL (ref 0.1–1.0)
Monocytes Relative: 9 %
Neutro Abs: 5 10*3/uL (ref 1.7–7.7)
Neutrophils Relative %: 71 %
Platelets: 172 10*3/uL (ref 150–400)
RBC: 4.94 MIL/uL (ref 4.22–5.81)
RDW: 12 % (ref 11.5–15.5)
WBC: 7 10*3/uL (ref 4.0–10.5)
nRBC: 0 % (ref 0.0–0.2)

## 2021-10-14 LAB — COMPREHENSIVE METABOLIC PANEL
ALT: 25 U/L (ref 0–44)
AST: 39 U/L (ref 15–41)
Albumin: 3.9 g/dL (ref 3.5–5.0)
Alkaline Phosphatase: 75 U/L (ref 38–126)
Anion gap: 7 (ref 5–15)
BUN: 8 mg/dL (ref 6–20)
CO2: 27 mmol/L (ref 22–32)
Calcium: 8.9 mg/dL (ref 8.9–10.3)
Chloride: 104 mmol/L (ref 98–111)
Creatinine, Ser: 0.68 mg/dL (ref 0.61–1.24)
GFR, Estimated: >60 mL/min (ref >60)
Glucose, Bld: 96 mg/dL (ref 70–99)
Potassium: 3.7 mmol/L (ref 3.5–5.1)
Sodium: 138 mmol/L (ref 135–145)
Total Bilirubin: 0.8 mg/dL (ref 0.3–1.2)
Total Protein: 7.8 g/dL (ref 6.5–8.1)

## 2021-10-14 LAB — HIV ANTIBODY (ROUTINE TESTING W REFLEX): HIV Screen 4th Generation wRfx: NONREACTIVE

## 2021-10-14 LAB — LIPASE, BLOOD: Lipase: 106 U/L — ABNORMAL HIGH (ref 11–51)

## 2021-10-14 MED ORDER — TAMSULOSIN HCL 0.4 MG PO CAPS
0.4000 mg | ORAL_CAPSULE | Freq: Every day | ORAL | Status: DC
  Administered 2021-10-14 – 2021-10-18 (×5): 0.4 mg via ORAL
  Filled 2021-10-14 (×5): qty 1

## 2021-10-14 NOTE — Plan of Care (Signed)

## 2021-10-14 NOTE — TOC Progression Note (Signed)
Transition of Care [facility]) - Progression Note    Patient Details  Name: Bruce [name] MRN: [id number] Date of Birth: 1971/02/06  Transition of Care [facility]) CM/SW [place], RN Phone Number: 10/14/2021, 9:36 AM  Clinical Narrative:   Met with patient at the bedside to discuss DC plan and needs He lives at home with his Fiance He is independent at home Patient states he quit alcohol but relapsed lately after a family tragedy, but plans to quit again.  He does not have a PCP in place, I provided him with the [facility] application and encouraged him to call for an appointment, I emailed the referral to them as well, We discussed getting meds at [facility], He stated that he has gotten meds there before.  I explained to get daily medications there on going he would need to fill out the application and turn it in to them, he stated understanding,          Expected Discharge Plan and Services                                                 Social Determinants of Health (SDOH) Interventions    Readmission Risk Interventions No flowsheet data found.

## 2021-10-14 NOTE — Progress Notes (Signed)
PROGRESS NOTE  Bruce [name]  DOB: 1971/04/20  PCP: Patient, No Pcp Per (Inactive) [id number]  DOA: 10/13/2021  LOS: 1 day  Hospital Day: 2  Chief Complaint  Patient presents with   Abdominal Pain   Nausea   Emesis    Brief narrative: Bruce [name] is a 50 y.o. male with PMH significant for atrial fibrillation, chronic alcoholic, chronic smoker, cocaine user and with chronic hepatitis C, ?history of A. fib Patient presented to the ED with complaint of right upper quadrant pain, nausea, vomiting. In the ED, patient was afebrile, heart rate 67, blood pressure 145/96. Labs with unremarkable CBC, chemistry with lipase elevated 218, slightly elevated AST. CT abdomen and pelvis showed acute edematous pancreatitis without collection. Patient was started on conservative management and admitted to hospitalist service.   Subjective: Patient was seen and examined this morning. Lying on bed.  Not in distress.  Abdominal pain improving. Still complains of painful urination  Assessment/Plan: Acute alcoholic pancreatitis -Presented with acute abdominal pain, nausea, vomiting in the setting of chronic alcoholism. -Elevated lipase.  CT abdomen showed acute edematous pancreatitis without collection.  No significant hepatobiliary finding. -Currently gradually improving on conservative management for acute pancreatitis.  Continue IV fluid, IV Protonix, IV Dilaudid, IV Zofran, IV Benadryl as needed. -We will start him on clear liquid diet today. Recent Labs  Lab 10/12/21 1933 10/13/21 0427 10/14/21 0230  LIPASE 118* 173* 106*   Dysuria -Patient has been complaining of dysuria and lower abdominal pain.  Since admission.   -Urinalysis on admission was negative for any infection.  CT abdomen pelvis showed bilateral hydrocele but no mention of any prostatic finding. -Patient states that he had similar issue last hospitalization and it improved with 'some medicine to improve the flow.'    -May have underlying BPH as he recounts symptoms of poor stream, hesitancy and inadequate voiding. I will start him on Flomax 0.4 mg daily.    Chronic alcoholism Elevated liver enzymes -At risk of withdrawal.  Currently on CIWA protocol.  Counseled to quit. -Monitor liver enzymes. Recent Labs  Lab 10/12/21 1933 10/13/21 0427 10/14/21 0230  AST 55* 51* 39  ALT 32 28 25  ALKPHOS 87 92 75  BILITOT 0.9 1.0 0.8  PROT 8.4* 8.3* 7.8  ALBUMIN 4.4 4.3 3.9   Polysubstance abuse (smoking, alcohol, cocaine) -Counseled to quit.  Chronic hepatitis C -continue Mavyret.  Mobility: Encourage ambulation Living condition: Lives at home Goals of care:   Code Status: Full Code  Nutritional status: Body mass index is 25.84 kg/m.      Diet:  Diet Order             Diet clear liquid Room service appropriate? Yes; Fluid consistency: Thin  Diet effective now                  DVT prophylaxis:  enoxaparin (LOVENOX) injection 40 mg Start: 10/13/21 0800   Antimicrobials: None Fluid: NS 125 mL/h to continue Consultants: None Family Communication: None at bedside  Status is: Inpatient  Continue in-hospital care because: Needs to continue conservative management for pancreatitis Level of care: Med-Surg   Dispo: The patient is from: Home              Anticipated d/c is to: Home in 2 to 3 days              Patient currently is not medically stable to d/c.   Difficult to place patient No  Infusions:   sodium chloride 125 mL/hr at 10/14/21 0428    Scheduled Meds:  diltiazem  120 mg Oral Daily   enoxaparin (LOVENOX) injection  40 mg Subcutaneous Q24H   Glecaprevir-Pibrentasvir  3 tablet Oral Q breakfast   pantoprazole (PROTONIX) IV  40 mg Intravenous Q12H   tamsulosin  0.4 mg Oral QPC breakfast    PRN meds: acetaminophen **OR** acetaminophen, diphenhydrAMINE, HYDROmorphone (DILAUDID) injection, ketorolac, LORazepam, magnesium hydroxide, morphine injection, ondansetron  (ZOFRAN) IV, ondansetron **OR** ondansetron (ZOFRAN) IV   Antimicrobials: Anti-infectives (From admission, onward)    Start     Dose/Rate Route Frequency Ordered Stop   10/13/21 0800  Glecaprevir-Pibrentasvir 100-40 MG TABS 3 tablet        3 tablet Oral Daily with breakfast 10/13/21 0216         Objective: Vitals:   10/14/21 0818 10/14/21 1105  BP: 118/76 125/83  Pulse: (!) 56 (!) 58  Resp: 16   Temp: 97.6 F (36.4 C)   SpO2: 94%     Intake/Output Summary (Last 24 hours) at 10/14/2021 1257 Last data filed at 10/14/2021 1100 Gross per 24 hour  Intake 2650.43 ml  Output 750 ml  Net 1900.43 ml   Filed Weights   10/12/21 1933  Weight: 74.8 kg   Weight change:  Body mass index is 25.84 kg/m.   Physical Exam: General exam: Pleasant, middle-aged African-American male.  Mild distress because of abdominal pain and dysuria.   Skin: No rashes, lesions or ulcers. HEENT: Atraumatic, normocephalic, no obvious bleeding Lungs: Clear to auscultation bilaterally CVS: Regular rate and rhythm, no murmur GI/Abd soft, mild to moderate tenderness in epigastrium, nondistended, bowel sound present CNS: Alert, awake, oriented x3 Psychiatry: Mood appropriate Extremities: No pedal edema, no calf tenderness  Data Review: I have personally reviewed the laboratory data and studies available.  F/u labs ordered Unresulted Labs (From admission, onward)     Start     Ordered   10/14/21 0500  CBC with Differential/Platelet  Daily,   STAT      10/13/21 1108   10/14/21 0500  Comprehensive metabolic panel  Daily,   STAT      10/13/21 1108   10/14/21 0500  Lipase, blood  Daily,   STAT      10/13/21 1108   10/14/21 0500  HIV Antibody (routine testing w rflx)  Once,   R        10/14/21 0500            Signed, Terrilee Croak, MD [facility] 10/14/2021

## 2021-10-15 LAB — CBC WITH DIFFERENTIAL/PLATELET
Abs Immature Granulocytes: 0.01 10*3/uL (ref 0.00–0.07)
Basophils Absolute: 0 10*3/uL (ref 0.0–0.1)
Basophils Relative: 0 %
Eosinophils Absolute: 0 10*3/uL (ref 0.0–0.5)
Eosinophils Relative: 0 %
HCT: 39.4 % (ref 39.0–52.0)
Hemoglobin: 14 g/dL (ref 13.0–17.0)
Immature Granulocytes: 0 %
Lymphocytes Relative: 36 %
Lymphs Abs: 2 10*3/uL (ref 0.7–4.0)
MCH: 32.2 pg (ref 26.0–34.0)
MCHC: 35.5 g/dL (ref 30.0–36.0)
MCV: 90.6 fL (ref 80.0–100.0)
Monocytes Absolute: 0.6 10*3/uL (ref 0.1–1.0)
Monocytes Relative: 10 %
Neutro Abs: 2.9 10*3/uL (ref 1.7–7.7)
Neutrophils Relative %: 54 %
Platelets: 167 10*3/uL (ref 150–400)
RBC: 4.35 MIL/uL (ref 4.22–5.81)
RDW: 11.9 % (ref 11.5–15.5)
WBC: 5.6 10*3/uL (ref 4.0–10.5)
nRBC: 0 % (ref 0.0–0.2)

## 2021-10-15 LAB — COMPREHENSIVE METABOLIC PANEL
ALT: 23 U/L (ref 0–44)
AST: 37 U/L (ref 15–41)
Albumin: 3.9 g/dL (ref 3.5–5.0)
Alkaline Phosphatase: 68 U/L (ref 38–126)
Anion gap: 8 (ref 5–15)
BUN: 8 mg/dL (ref 6–20)
CO2: 26 mmol/L (ref 22–32)
Calcium: 8.9 mg/dL (ref 8.9–10.3)
Chloride: 102 mmol/L (ref 98–111)
Creatinine, Ser: 0.77 mg/dL (ref 0.61–1.24)
GFR, Estimated: >60 mL/min (ref >60)
Glucose, Bld: 161 mg/dL — ABNORMAL HIGH (ref 70–99)
Potassium: 3.2 mmol/L — ABNORMAL LOW (ref 3.5–5.1)
Sodium: 136 mmol/L (ref 135–145)
Total Bilirubin: 1 mg/dL (ref 0.3–1.2)
Total Protein: 7.8 g/dL (ref 6.5–8.1)

## 2021-10-15 LAB — LIPASE, BLOOD: Lipase: 44 U/L (ref 11–51)

## 2021-10-15 MED ORDER — OXYCODONE-ACETAMINOPHEN 5-325 MG PO TABS
1.0000 | ORAL_TABLET | Freq: Four times a day (QID) | ORAL | Status: DC | PRN
  Administered 2021-10-15: 1 via ORAL
  Filled 2021-10-15: qty 1

## 2021-10-15 MED ORDER — OXYCODONE-ACETAMINOPHEN 5-325 MG PO TABS
1.0000 | ORAL_TABLET | ORAL | Status: DC | PRN
  Administered 2021-10-15 – 2021-10-18 (×17): 1 via ORAL
  Filled 2021-10-15 (×17): qty 1

## 2021-10-15 NOTE — Plan of Care (Signed)

## 2021-10-15 NOTE — Progress Notes (Signed)
PROGRESS NOTE  Bruce [name]  DOB: 1971-06-10  PCP: Patient, No Pcp Per (Inactive) [id number]  DOA: 10/13/2021  LOS: 2 days  Hospital Day: 3  Chief Complaint  Patient presents with   Abdominal Pain   Nausea   Emesis    Brief narrative: Bruce [name] [name] is a 50 y.o. male with PMH significant for atrial fibrillation, chronic alcoholic, chronic smoker, cocaine user and with chronic hepatitis C, ?history of A. fib Patient presented to the ED with complaint of right upper quadrant pain, nausea, vomiting. In the ED, patient was afebrile, heart rate 67, blood pressure 145/96. Labs with unremarkable CBC, chemistry with lipase elevated 218, slightly elevated AST. CT abdomen and pelvis showed acute edematous pancreatitis without collection. Patient was started on conservative management and admitted to hospitalist service.   Subjective: Patient was seen and examined this morning. Feels better.  Abdominal pain improving.  Wants to advance diet.  Lipase level this morning improved.  Assessment/Plan: Acute alcoholic pancreatitis -Presented with acute abdominal pain, nausea, vomiting in the setting of chronic alcoholism. -Elevated lipase.  CT abdomen showed acute edematous pancreatitis without collection.  No significant hepatobiliary finding. -Currently gradually improving on conservative management for acute pancreatitis.  Currently on IV fluid, IV Protonix, IV Dilaudid, IV Zofran, IV Benadryl as needed. -Able to tolerate full liquid diet.  Advance to soft diet for lunch.  He wants to try regular diet.  I would wait till tomorrow morning for that. -Stop IV Dilaudid.  Percocet for pain.  Reduce IV fluid to 50 mill per hour.  Recent Labs  Lab 10/12/21 1933 10/13/21 0427 10/14/21 0230 10/15/21 0140  LIPASE 118* 173* 106* 44    Dysuria -On admission, patient complained of significant dysuria.  Urinalysis on admission was negative for any infection.  CT abdomen pelvis showed  bilateral hydrocele but no mention of any prostatic finding.  Hydrocele does not seem to be acutely significant on examination. -Patient states that he had similar issue last hospitalization and it improved with 'some medicine to improve the flow.'   -May have underlying BPH as he recounts symptoms of poor stream, hesitancy and inadequate voiding.  Started on 0.4 mg daily.    Chronic alcoholism Elevated liver enzymes -At risk of withdrawal.  Currently on CIWA protocol.  Counseled to quit. -Monitor liver enzymes. Recent Labs  Lab 10/12/21 1933 10/13/21 0427 10/14/21 0230 10/15/21 0140  AST 55* 51* 39 37  ALT 32 28 25 23   ALKPHOS 87 92 75 68  BILITOT 0.9 1.0 0.8 1.0  PROT 8.4* 8.3* 7.8 7.8  ALBUMIN 4.4 4.3 3.9 3.9    Polysubstance abuse (smoking, alcohol, cocaine) -Counseled to quit.  Chronic hepatitis C -continue Mavyret.  Mobility: Encourage ambulation Living condition: Lives at home Goals of care:   Code Status: Full Code  Nutritional status: Body mass index is 25.84 kg/m.      Diet:  Diet Order             Diet regular Room service appropriate? Yes; Fluid consistency: Thin  Diet effective 0500           DIET SOFT Room service appropriate? Yes; Fluid consistency: Thin  Diet effective now                  DVT prophylaxis:  enoxaparin (LOVENOX) injection 40 mg Start: 10/13/21 0800   Antimicrobials: None Fluid: Reduce NS to 50 mill per hour Consultants: None Family Communication: None at bedside  Status is:  Inpatient  Continue in-hospital care because: Pancreatitis improving with conservative management Level of care: Med-Surg   Dispo: The patient is from: Home              Anticipated d/c is to: Likely home tomorrow              Patient currently is not medically stable to d/c.   Difficult to place patient No     Infusions:   sodium chloride 125 mL/hr at 10/15/21 1120    Scheduled Meds:  diltiazem  120 mg Oral Daily   enoxaparin (LOVENOX)  injection  40 mg Subcutaneous Q24H   Glecaprevir-Pibrentasvir  3 tablet Oral Q breakfast   pantoprazole (PROTONIX) IV  40 mg Intravenous Q12H   tamsulosin  0.4 mg Oral QPC breakfast    PRN meds: acetaminophen **OR** acetaminophen, diphenhydrAMINE, LORazepam, magnesium hydroxide, ondansetron (ZOFRAN) IV, ondansetron **OR** ondansetron (ZOFRAN) IV, oxyCODONE-acetaminophen   Antimicrobials: Anti-infectives (From admission, onward)    Start     Dose/Rate Route Frequency Ordered Stop   10/13/21 0800  Glecaprevir-Pibrentasvir 100-40 MG TABS 3 tablet        3 tablet Oral Daily with breakfast 10/13/21 0216         Objective: Vitals:   10/15/21 0751 10/15/21 1125  BP: 137/83 117/70  Pulse: 73 64  Resp: 16 16  Temp: 98.5 F (36.9 C) 98.1 F (36.7 C)  SpO2: 99% 96%    Intake/Output Summary (Last 24 hours) at 10/15/2021 1522 Last data filed at 10/15/2021 1024 Gross per 24 hour  Intake 1124.4 ml  Output 1050 ml  Net 74.4 ml    Filed Weights   10/12/21 1933  Weight: 74.8 kg   Weight change:  Body mass index is 25.84 kg/m.   Physical Exam: General exam: Pleasant, middle-aged African-American male.  Not in distress  skin: No rashes, lesions or ulcers. HEENT: Atraumatic, normocephalic, no obvious bleeding Lungs: Clear to auscultation bilaterally CVS: Regular rate and rhythm, no murmur GI/Abd soft, improved epigastric tenderness, nondistended, bowel sound present CNS: Alert, awake, oriented x3 Psychiatry: Mood appropriate Extremities: No pedal edema, no calf tenderness  Data Review: I have personally reviewed the laboratory data and studies available.  F/u labs ordered Unresulted Labs (From admission, onward)     Start     Ordered   10/14/21 0500  CBC with Differential/Platelet  Daily,   STAT      10/13/21 1108   10/14/21 0500  Comprehensive metabolic panel  Daily,   STAT      10/13/21 1108   10/14/21 0500  Lipase, blood  Daily,   STAT      10/13/21 1108             Signed, Lorin Glass, MD [facility] 10/15/2021

## 2021-10-16 DIAGNOSIS — K852 Alcohol induced acute pancreatitis without necrosis or infection: Principal | ICD-10-CM

## 2021-10-16 LAB — COMPREHENSIVE METABOLIC PANEL
ALT: 19 U/L (ref 0–44)
AST: 34 U/L (ref 15–41)
Albumin: 3.1 g/dL — ABNORMAL LOW (ref 3.5–5.0)
Alkaline Phosphatase: 48 U/L (ref 38–126)
Anion gap: 5 (ref 5–15)
BUN: 7 mg/dL (ref 6–20)
CO2: 29 mmol/L (ref 22–32)
Calcium: 8.7 mg/dL — ABNORMAL LOW (ref 8.9–10.3)
Chloride: 104 mmol/L (ref 98–111)
Creatinine, Ser: 0.75 mg/dL (ref 0.61–1.24)
GFR, Estimated: >60 mL/min (ref >60)
Glucose, Bld: 171 mg/dL — ABNORMAL HIGH (ref 70–99)
Potassium: 3.3 mmol/L — ABNORMAL LOW (ref 3.5–5.1)
Sodium: 138 mmol/L (ref 135–145)
Total Bilirubin: 0.7 mg/dL (ref 0.3–1.2)
Total Protein: 6.4 g/dL — ABNORMAL LOW (ref 6.5–8.1)

## 2021-10-16 LAB — CBC WITH DIFFERENTIAL/PLATELET
Abs Immature Granulocytes: 0.01 10*3/uL (ref 0.00–0.07)
Basophils Absolute: 0 10*3/uL (ref 0.0–0.1)
Basophils Relative: 0 %
Eosinophils Absolute: 0.1 10*3/uL (ref 0.0–0.5)
Eosinophils Relative: 2 %
HCT: 34.7 % — ABNORMAL LOW (ref 39.0–52.0)
Hemoglobin: 12.6 g/dL — ABNORMAL LOW (ref 13.0–17.0)
Immature Granulocytes: 0 %
Lymphocytes Relative: 39 %
Lymphs Abs: 1.4 10*3/uL (ref 0.7–4.0)
MCH: 32.6 pg (ref 26.0–34.0)
MCHC: 36.3 g/dL — ABNORMAL HIGH (ref 30.0–36.0)
MCV: 89.9 fL (ref 80.0–100.0)
Monocytes Absolute: 0.4 10*3/uL (ref 0.1–1.0)
Monocytes Relative: 11 %
Neutro Abs: 1.7 10*3/uL (ref 1.7–7.7)
Neutrophils Relative %: 48 %
Platelets: 160 10*3/uL (ref 150–400)
RBC: 3.86 MIL/uL — ABNORMAL LOW (ref 4.22–5.81)
RDW: 11.6 % (ref 11.5–15.5)
WBC: 3.6 10*3/uL — ABNORMAL LOW (ref 4.0–10.5)
nRBC: 0 % (ref 0.0–0.2)

## 2021-10-16 LAB — MAGNESIUM: Magnesium: 1.9 mg/dL (ref 1.7–2.4)

## 2021-10-16 LAB — PHOSPHORUS: Phosphorus: 2.5 mg/dL (ref 2.5–4.6)

## 2021-10-16 LAB — LIPASE, BLOOD: Lipase: 33 U/L (ref 11–51)

## 2021-10-16 MED ORDER — POTASSIUM CHLORIDE CRYS ER 20 MEQ PO TBCR
40.0000 meq | EXTENDED_RELEASE_TABLET | Freq: Once | ORAL | Status: AC
  Administered 2021-10-16: 40 meq via ORAL
  Filled 2021-10-16: qty 2

## 2021-10-16 MED ORDER — PANTOPRAZOLE SODIUM 40 MG PO TBEC
40.0000 mg | DELAYED_RELEASE_TABLET | Freq: Every day | ORAL | Status: DC
  Administered 2021-10-17 – 2021-10-18 (×2): 40 mg via ORAL
  Filled 2021-10-16 (×2): qty 1

## 2021-10-16 NOTE — Progress Notes (Signed)
Patient PROGRESS NOTE  Bruce [name]  DOB: 07/10/1971  PCP: Patient, No Pcp Per (Inactive) [id number]  DOA: 10/13/2021  LOS: 3 days  Hospital Day: 4  Chief Complaint  Patient presents with   Abdominal Pain   Nausea   Emesis    Brief narrative: Bruce [name] is a 50 y.o. male with PMH significant for atrial fibrillation, chronic alcoholic, chronic smoker, cocaine user and with chronic hepatitis C, ?history of A. fib Patient presented to the ED with complaint of right upper quadrant pain, nausea, vomiting. In the ED, patient was afebrile, heart rate 67, blood pressure 145/96. Labs with unremarkable CBC, chemistry with lipase elevated 218, slightly elevated AST. CT abdomen and pelvis showed acute edematous pancreatitis without collection. Patient was started on conservative management and admitted to hospitalist service.   Subjective: No significant overnight events, patient is still having significant abdominal pain and unable to tolerate regular food.  He is also having symptoms of BPH and urinary issues. Denied any chest palpitations, no shortness of breath.   Assessment/Plan: Acute alcoholic pancreatitis -Presented with acute abdominal pain, nausea, vomiting in the setting of chronic alcoholism. -Elevated lipase.  CT abdomen showed acute edematous pancreatitis without collection.  No significant hepatobiliary finding. -Currently gradually improving on conservative management for acute pancreatitis.  Currently on IV fluid, IV Protonix, IV Dilaudid, IV Zofran, IV Benadryl as needed. -Able to tolerate full liquid diet.  Advance to soft diet for lunch.  He wants to try regular diet.  I would wait till tomorrow morning for that. -Stop IV Dilaudid.  Percocet for pain.  Reduce IV fluid to 50 mill per hour.  Recent Labs  Lab 10/12/21 1933 10/13/21 0427 10/14/21 0230 10/15/21 0140 10/16/21 0504  LIPASE 118* 173* 106* 44 33   Dysuria -On admission, patient complained of  significant dysuria.  Urinalysis on admission was negative for any infection.  CT abdomen pelvis showed bilateral hydrocele but no mention of any prostatic finding.  Hydrocele does not seem to be acutely significant on examination. -Patient states that he had similar issue last hospitalization and it improved with 'some medicine to improve the flow.'   -May have underlying BPH as he recounts symptoms of poor stream, hesitancy and inadequate voiding.  Started on Flomax 0.4 mg daily.    Chronic alcoholism Elevated liver enzymes -At risk of withdrawal.  Currently on CIWA protocol.  Counseled to quit. -Monitor liver enzymes. Recent Labs  Lab 10/12/21 1933 10/13/21 0427 10/14/21 0230 10/15/21 0140 10/16/21 0504  AST 55* 51* 39 37 34  ALT 32 28 25 23 19   ALKPHOS 87 92 75 68 48  BILITOT 0.9 1.0 0.8 1.0 0.7  PROT 8.4* 8.3* 7.8 7.8 6.4*  ALBUMIN 4.4 4.3 3.9 3.9 3.1*   Polysubstance abuse (smoking, alcohol, cocaine) -Counseled to quit.  Chronic hepatitis C -continue Mavyret.  Mobility: Encourage ambulation Living condition: Lives at home Goals of care:   Code Status: Full Code  Nutritional status: Body mass index is 25.84 kg/m.      Diet:  Diet Order             Diet regular Room service appropriate? Yes; Fluid consistency: Thin  Diet effective 0500                  DVT prophylaxis:  enoxaparin (LOVENOX) injection 40 mg Start: 10/13/21 0800   Antimicrobials: None Fluid: Reduce NS to 50 mill per hour Consultants: None Family Communication: None at bedside  Status  is: Inpatient  Continue in-hospital care because: Pancreatitis improving with conservative management Level of care: Med-Surg   Dispo: The patient is from: Home              Anticipated d/c is to: Likely home tomorrow              Patient currently is not medically stable to d/c.   Difficult to place patient No     Infusions:   sodium chloride 50 mL/hr at 10/16/21 0525    Scheduled Meds:   diltiazem  120 mg Oral Daily   enoxaparin (LOVENOX) injection  40 mg Subcutaneous Q24H   Glecaprevir-Pibrentasvir  3 tablet Oral Q breakfast   pantoprazole  40 mg Oral Daily   tamsulosin  0.4 mg Oral QPC breakfast    PRN meds: acetaminophen **OR** acetaminophen, diphenhydrAMINE, LORazepam, magnesium hydroxide, ondansetron **OR** ondansetron (ZOFRAN) IV, oxyCODONE-acetaminophen   Antimicrobials: Anti-infectives (From admission, onward)    Start     Dose/Rate Route Frequency Ordered Stop   10/13/21 0800  Glecaprevir-Pibrentasvir 100-40 MG TABS 3 tablet        3 tablet Oral Daily with breakfast 10/13/21 0216         Objective: Vitals:   10/16/21 1117 10/16/21 1525  BP: (!) 146/93 137/85  Pulse: 62 66  Resp: 16 17  Temp: 98.7 F (37.1 C) 97.8 F (36.6 C)  SpO2: 99% 98%    Intake/Output Summary (Last 24 hours) at 10/16/2021 1554 Last data filed at 10/16/2021 1447 Gross per 24 hour  Intake 2151.75 ml  Output 583 ml  Net 1568.75 ml   Filed Weights   10/12/21 1933  Weight: 74.8 kg   Weight change:  Body mass index is 25.84 kg/m.   Physical Exam: General exam: Pleasant, middle-aged African-American male.  Not in distress  skin: No rashes, lesions or ulcers. HEENT: Atraumatic, normocephalic, no obvious bleeding Lungs: Clear to auscultation bilaterally CVS: Regular rate and rhythm, no murmur GI/Abd soft, epigastric tenderness, nondistended, bowel sound present CNS: Alert, awake, oriented x3 Psychiatry: Mood appropriate Extremities: No pedal edema, no calf tenderness  Data Review: I have personally reviewed the laboratory data and studies available.  F/u labs ordered Unresulted Labs (From admission, onward)    None       Signed, Val Riles, MD [facility] 10/16/2021

## 2021-10-17 MED ORDER — POTASSIUM CHLORIDE CRYS ER 20 MEQ PO TBCR
40.0000 meq | EXTENDED_RELEASE_TABLET | Freq: Once | ORAL | Status: AC
  Administered 2021-10-17: 40 meq via ORAL
  Filled 2021-10-17: qty 2

## 2021-10-17 NOTE — Progress Notes (Signed)
Patient PROGRESS NOTE  Bruce [name]  DOB: [birth year]  PCP: Patient, No Pcp Per (Inactive) [id number]  DOA: 10/13/2021  LOS: 4 days  Hospital Day: 5  Chief Complaint  Patient presents with   Abdominal Pain   Nausea   Emesis    Brief narrative: Bruce [name] is a 50 y.o. male with PMH significant for atrial fibrillation, chronic alcoholic, chronic smoker, cocaine user and with chronic hepatitis C, ?history of A. fib Patient presented to the ED with complaint of right upper quadrant pain, nausea, vomiting. In the ED, patient was afebrile, heart rate 67, blood pressure 145/96. Labs with unremarkable CBC, chemistry with lipase elevated 218, slightly elevated AST. CT abdomen and pelvis showed acute edematous pancreatitis without collection. Patient was started on conservative management and admitted to hospitalist service.   Subjective: No significant overnight events, patient had significant abdominal pain last night after drinking juice and he is unable to tolerate solid food.  Patient took pain medication in the morning and then pain is easing off.  Patient will like to stay another day and try for liquid diet today if remains stable then he would like to go home tomorrow. Denied any chest palpitations, no shortness of breath.   Assessment/Plan: Acute alcoholic pancreatitis -Presented with acute abdominal pain, nausea, vomiting in the setting of chronic alcoholism. -Elevated lipase.  CT abdomen showed acute edematous pancreatitis without collection.  No significant hepatobiliary finding. -Currently gradually improving on conservative management for acute pancreatitis.  Currently on IV fluid, IV Protonix, IV Dilaudid, IV Zofran, IV Benadryl as needed. -Able to tolerate full liquid diet.  Advance to soft diet for lunch.  He wants to try regular diet.  I would wait till tomorrow morning for that. -Stop IV Dilaudid.  Percocet for pain.  Reduce IV fluid to 50 mill per hour.   Recent Labs  Lab 10/12/21 1933 10/13/21 0427 10/14/21 0230 10/15/21 0140 10/16/21 0504  LIPASE 118* 173* 106* 44 33   Dysuria -On admission, patient complained of significant dysuria.  Urinalysis on admission was negative for any infection.  CT abdomen pelvis showed bilateral hydrocele but no mention of any prostatic finding.  Hydrocele does not seem to be acutely significant on examination. -Patient states that he had similar issue last hospitalization and it improved with 'some medicine to improve the flow.'   -May have underlying BPH as he recounts symptoms of poor stream, hesitancy and inadequate voiding.  Started on Flomax 0.4 mg daily.    Chronic alcoholism Elevated liver enzymes -At risk of withdrawal.  Currently on CIWA protocol.  Counseled to quit. -Monitor liver enzymes. Recent Labs  Lab 10/12/21 1933 10/13/21 0427 10/14/21 0230 10/15/21 0140 10/16/21 0504  AST 55* 51* 39 37 34  ALT 32 28 25 23 19   ALKPHOS 87 92 75 68 48  BILITOT 0.9 1.0 0.8 1.0 0.7  PROT 8.4* 8.3* 7.8 7.8 6.4*  ALBUMIN 4.4 4.3 3.9 3.9 3.1*   Polysubstance abuse (smoking, alcohol, cocaine) -Counseled to quit.  Chronic hepatitis C -continue Mavyret.  Mobility: Encourage ambulation Living condition: Lives at home Goals of care:   Code Status: Full Code  Nutritional status: Body mass index is 25.84 kg/m.      Diet:  Diet Order             Diet regular Room service appropriate? Yes; Fluid consistency: Thin  Diet effective 0500  DVT prophylaxis:  enoxaparin (LOVENOX) injection 40 mg Start: 10/13/21 0800   Antimicrobials: None Fluid: Reduce NS to 50 mill per hour Consultants: None Family Communication: None at bedside  Status is: Inpatient  Continue in-hospital care because: Pancreatitis improving with conservative management Level of care: Med-Surg   Dispo: The patient is from: Home              Anticipated d/c is to: Likely home tomorrow               Patient currently is not medically stable to d/c.   Difficult to place patient No     Infusions:   sodium chloride 50 mL/hr at 10/16/21 2206    Scheduled Meds:  diltiazem  120 mg Oral Daily   enoxaparin (LOVENOX) injection  40 mg Subcutaneous Q24H   Glecaprevir-Pibrentasvir  3 tablet Oral Q breakfast   pantoprazole  40 mg Oral Daily   tamsulosin  0.4 mg Oral QPC breakfast    PRN meds: acetaminophen **OR** acetaminophen, diphenhydrAMINE, LORazepam, magnesium hydroxide, ondansetron **OR** ondansetron (ZOFRAN) IV, oxyCODONE-acetaminophen   Antimicrobials: Anti-infectives (From admission, onward)    Start     Dose/Rate Route Frequency Ordered Stop   10/13/21 0800  Glecaprevir-Pibrentasvir 100-40 MG TABS 3 tablet        3 tablet Oral Daily with breakfast 10/13/21 0216         Objective: Vitals:   10/17/21 0413 10/17/21 0749  BP: 133/74 (!) 138/97  Pulse: 60 63  Resp: 16 18  Temp: 98.4 F (36.9 C) 98.2 F (36.8 C)  SpO2: 96% 98%    Intake/Output Summary (Last 24 hours) at 10/17/2021 1455 Last data filed at 10/17/2021 0100 Gross per 24 hour  Intake --  Output 200 ml  Net -200 ml   Filed Weights   10/12/21 1933  Weight: 74.8 kg   Weight change:  Body mass index is 25.84 kg/m.   Physical Exam: General exam: Pleasant, middle-aged African-American male.  Not in distress  skin: No rashes, lesions or ulcers. HEENT: Atraumatic, normocephalic, no obvious bleeding Lungs: Clear to auscultation bilaterally CVS: Regular rate and rhythm, no murmur GI/Abd soft, epigastric tenderness, nondistended, bowel sound present CNS: Alert, awake, oriented x3 Psychiatry: Mood appropriate Extremities: No pedal edema, no calf tenderness  Data Review: I have personally reviewed the laboratory data and studies available.  F/u labs ordered Unresulted Labs (From admission, onward)     Start     Ordered   10/18/21 0500  CBC  Tomorrow morning,   R        10/17/21 0923   10/18/21  0500  Basic metabolic panel  Tomorrow morning,   R        10/17/21 0923   10/18/21 0500  Magnesium  Tomorrow morning,   R        10/17/21 0923   10/18/21 0500  Phosphorus  Tomorrow morning,   R        10/17/21 4259            Signed, Gillis Santa, MD [facility] 10/17/2021

## 2021-10-18 ENCOUNTER — Other Ambulatory Visit: Payer: Self-pay

## 2021-10-18 LAB — PHOSPHORUS: Phosphorus: 3.2 mg/dL (ref 2.5–4.6)

## 2021-10-18 LAB — BASIC METABOLIC PANEL
Anion gap: 5 (ref 5–15)
BUN: 6 mg/dL (ref 6–20)
CO2: 26 mmol/L (ref 22–32)
Calcium: 8.8 mg/dL — ABNORMAL LOW (ref 8.9–10.3)
Chloride: 101 mmol/L (ref 98–111)
Creatinine, Ser: 0.77 mg/dL (ref 0.61–1.24)
GFR, Estimated: >60 mL/min (ref >60)
Glucose, Bld: 127 mg/dL — ABNORMAL HIGH (ref 70–99)
Potassium: 3.4 mmol/L — ABNORMAL LOW (ref 3.5–5.1)
Sodium: 132 mmol/L — ABNORMAL LOW (ref 135–145)

## 2021-10-18 LAB — CBC
HCT: 36.4 % — ABNORMAL LOW (ref 39.0–52.0)
Hemoglobin: 13.4 g/dL (ref 13.0–17.0)
MCH: 32.8 pg (ref 26.0–34.0)
MCHC: 36.8 g/dL — ABNORMAL HIGH (ref 30.0–36.0)
MCV: 89 fL (ref 80.0–100.0)
Platelets: 200 10*3/uL (ref 150–400)
RBC: 4.09 MIL/uL — ABNORMAL LOW (ref 4.22–5.81)
RDW: 11.5 % (ref 11.5–15.5)
WBC: 3.9 10*3/uL — ABNORMAL LOW (ref 4.0–10.5)
nRBC: 0 % (ref 0.0–0.2)

## 2021-10-18 LAB — MAGNESIUM: Magnesium: 2.1 mg/dL (ref 1.7–2.4)

## 2021-10-18 MED ORDER — DILTIAZEM HCL ER COATED BEADS 120 MG PO CP24
120.0000 mg | ORAL_CAPSULE | Freq: Every day | ORAL | 0 refills | Status: DC
  Filled 2021-10-18: qty 30, 30d supply, fill #0

## 2021-10-18 MED ORDER — PANTOPRAZOLE SODIUM 40 MG PO TBEC
40.0000 mg | DELAYED_RELEASE_TABLET | Freq: Every day | ORAL | 0 refills | Status: DC
  Filled 2021-10-18: qty 14, 14d supply, fill #0

## 2021-10-18 MED ORDER — TAMSULOSIN HCL 0.4 MG PO CAPS
0.4000 mg | ORAL_CAPSULE | Freq: Every day | ORAL | 0 refills | Status: DC
  Filled 2021-10-18: qty 30, 30d supply, fill #0
  Filled 2021-12-02: qty 30, 30d supply, fill #1
  Filled 2022-01-10: qty 30, 30d supply, fill #2

## 2021-10-18 MED ORDER — OXYCODONE-ACETAMINOPHEN 5-325 MG PO TABS: 1.0000 | ORAL_TABLET | ORAL | 0 refills | Status: AC | PRN

## 2021-10-18 MED ORDER — POTASSIUM CHLORIDE CRYS ER 20 MEQ PO TBCR
40.0000 meq | EXTENDED_RELEASE_TABLET | Freq: Once | ORAL | Status: AC
  Administered 2021-10-18: 40 meq via ORAL
  Filled 2021-10-18: qty 2

## 2021-10-18 NOTE — Discharge Summary (Signed)
[facility] Discharge Summary   Patient: Bruce [name] [id number]  PCP: Patient, No Pcp Per (Inactive)  Date of admission: 10/13/2021   Date of discharge:  10/18/2021     Discharge Diagnoses:  Principal Problem:   Acute pancreatitis   Admitted From: Home Disposition:  Home   Recommendations for Outpatient Follow-up:  Follow-up with PCP in 1 week, gradually advance diet as per tolerance.  Alcohol abstinence counseling done. Follow with urology for BPH in 1 week Follow with GI as an outpatient if persistent problem with pancreatitis and abdominal pain. Follow up LABS/TEST:     Diet recommendation: soft diet, advance as per tolerance  Activity: The patient is advised to gradually reintroduce usual activities, as tolerated  Discharge Condition: stable  Code Status: Full code   History of present illness: As per the H and P dictated on admission Hospital Course:  Bruce [name] is a 50 y.o. male with PMH significant for atrial fibrillation, chronic alcoholic, chronic smoker, cocaine user and with chronic hepatitis C, ?history of A. fib Patient presented to the ED with complaint of right upper quadrant pain, nausea, vomiting. In the ED, patient was afebrile, heart rate 67, blood pressure 145/96. Labs with unremarkable CBC, chemistry with lipase elevated 218, slightly elevated AST. CT abdomen and pelvis showed acute edematous pancreatitis without collection. Patient was started on conservative management and admitted to hospitalist service. Acute alcoholic pancreatitis presented with acute abdominal pain, nausea, vomiting in the setting of chronic alcoholism. -Elevated lipase.  CT abdomen showed acute edematous pancreatitis without collection.  No significant hepatobiliary finding.Currently gradually improving on conservative management for acute pancreatitis. S/p IV fluid, IV Protonix, IV Dilaudid, IV Zofran, IV Benadryl as needed. Able to tolerate full liquid diet.   Advance to soft diet for lunch.  He wants to try regular diet which was given the day before yesterday but he could not tolerate, so he tried full liquid and very soft diet yesterday and now he is feeling little bit improvement.  Patient would like to go home with some Percocet prescription for pain control so 3-day supply 10 pills prescribed for as needed pain control.  Patient agreed for the discharge planning today. Last Labs          Recent Labs  Lab 10/12/21 1933 10/13/21 0427 10/14/21 0230 10/15/21 0140 10/16/21 0504  LIPASE 118* 173* 106* 44 33      Dysuria -On admission, patient complained of significant dysuria.  Urinalysis on admission was negative for any infection.  CT abdomen pelvis showed bilateral hydrocele but no mention of any prostatic finding.  Hydrocele does not seem to be acutely significant on examination. -Patient states that he had similar issue last hospitalization and it improved with 'some medicine to improve the flow.'   -May have underlying BPH as he recounts symptoms of poor stream, hesitancy and inadequate voiding.  Started on Flomax 0.4 mg daily.     Chronic alcoholism Elevated liver enzymes -Patient was on CIWA protocol, no withdrawal symptoms during hospital stay.  Alcohol abstinence counseling done.    Last Labs          Recent Labs  Lab 10/12/21 1933 10/13/21 0427 10/14/21 0230 10/15/21 0140 10/16/21 0504  AST 55* 51* 39 37 34  ALT 32 28 25 23 19   ALKPHOS 87 92 75 68 48  BILITOT 0.9 1.0 0.8 1.0 0.7  PROT 8.4* 8.3* 7.8 7.8 6.4*  ALBUMIN 4.4 4.3 3.9 3.9 3.1*  Polysubstance abuse (smoking, alcohol, cocaine), Counseled to quit. Chronic hepatitis C, continue Mavyret. Body mass index is 25.84 kg/m.  Nutrition Interventions:      Pain control  - [place] Controlled Substance Reporting System database was reviewed. - 3 day supply, 10 pills prescribed. - Patient was instructed, not to drive, operate heavy machinery, perform  activities at heights, swimming or participation in water activities or provide baby sitting services while on Pain, Sleep and Anxiety Medications; until his outpatient Physician has advised to do so again.  - Also recommended to not to take more than prescribed Pain, Sleep and Anxiety Medications.  Patient was ambulatory without any assistance. On the day of the discharge the patient's vitals were stable, and no other acute medical condition were reported by patient. the patient was felt safe to be discharge at Home .  Consultants: None Procedures: none  Discharge Exam: General: Appear in no distress, no Rash; Oral Mucosa Clear, moist. Cardiovascular: S1 and S2 Present, no Murmur, Respiratory: normal respiratory effort, Bilateral Air entry present and no Crackles, no wheezes Abdomen: Bowel Sound present, Soft and no tenderness, no hernia Extremities: no Pedal edema, no calf tenderness Neurology: alert and oriented to time, place, and person affect appropriate.  Filed Weights   10/12/21 1933  Weight: 74.8 kg   Vitals:   10/18/21 0755 10/18/21 1111  BP: (!) 143/92 123/80  Pulse: (!) 54 63  Resp: 15 15  Temp: 98.6 F (37 C) 98.4 F (36.9 C)  SpO2: 98% 98%    DISCHARGE MEDICATION: Allergies as of 10/18/2021       Reactions   Penicillins Other (See Comments)   Did it involve swelling of the face/tongue/throat, SOB, or low BP? No Did it involve sudden or severe rash/hives, skin peeling, or any reaction on the inside of your mouth or nose? No Did you need to seek medical attention at a hospital or doctor's office? No When did it last happen?       If all above answers are "NO", may proceed with cephalosporin use.        Medication List     TAKE these medications    B-12 PO Take by mouth as needed.   diltiazem 120 MG 24 hr capsule Commonly known as: CARDIZEM CD Take 1 capsule (120 mg total) by mouth daily.   Mavyret 100-40 MG Tabs Generic drug:  Glecaprevir-Pibrentasvir Take 3 tablets by mouth daily with breakfast.   oxyCODONE-acetaminophen 5-325 MG tablet Commonly known as: PERCOCET/ROXICET Take 1 tablet by mouth every 4 (four) hours as needed for up to 3 days for severe pain.   pantoprazole 40 MG tablet Commonly known as: PROTONIX Take 1 tablet (40 mg total) by mouth daily for 14 days. Start taking on: October 19, 2021   tamsulosin 0.4 MG Caps capsule Commonly known as: FLOMAX Take 1 capsule (0.4 mg total) by mouth daily after breakfast. Start taking on: October 19, 2021       Allergies  Allergen Reactions   Penicillins Other (See Comments)    Did it involve swelling of the face/tongue/throat, SOB, or low BP? No Did it involve sudden or severe rash/hives, skin peeling, or any reaction on the inside of your mouth or nose? No Did you need to seek medical attention at a hospital or doctor's office? No When did it last happen?       If all above answers are "NO", may proceed with cephalosporin use.   Discharge Instructions  Call MD for:  difficulty breathing, headache or visual disturbances   Complete by: As directed    Call MD for:  extreme fatigue   Complete by: As directed    Call MD for:  persistant dizziness or light-headedness   Complete by: As directed    Call MD for:  persistant nausea and vomiting   Complete by: As directed    Call MD for:  severe uncontrolled pain   Complete by: As directed    Diet - low sodium heart healthy   Complete by: As directed    Discharge instructions   Complete by: As directed    Follow-up with PCP in 1 week, gradually advance diet as per tolerance.  Alcohol abstinence counseling done. Follow with urology for BPH in 1 week Follow with GI as an outpatient if persistent problem with pancreatitis and abdominal pain.   Increase activity slowly   Complete by: As directed        The results of significant diagnostics from this hospitalization (including imaging,  microbiology, ancillary and laboratory) are listed below for reference.    Significant Diagnostic Studies: CT ABDOMEN PELVIS WO CONTRAST  Result Date: 10/13/2021 CLINICAL DATA:  Acute nonlocalized abdominal pain. EXAM: CT ABDOMEN AND PELVIS WITHOUT CONTRAST TECHNIQUE: Multidetector CT imaging of the abdomen and pelvis was performed following the standard protocol without IV contrast. COMPARISON:  12/21/2020 FINDINGS: Lower chest:  No contributory findings. Hepatobiliary: No focal liver abnormality.No evidence of biliary obstruction or stone. Pancreas: Fat stranding with expansion involving the pancreatic head and body. No evidence of collection. Spleen: Unremarkable. Adrenals/Urinary Tract: Negative adrenals. No hydronephrosis or stone. Unremarkable bladder. Stomach/Bowel:  No obstruction. No appendicitis. Vascular/Lymphatic: No acute vascular abnormality. No mass or adenopathy. Coarse calcification at the hepatic gastric ligament, likely granulomatous calcified node. Reproductive:Bilateral hydrocele. Other: No ascites or pneumoperitoneum. Musculoskeletal: No acute abnormalities. Lumbar spine degeneration with L4-5 anterolisthesis. IMPRESSION: Acute edematous pancreatitis without collection. Electronically Signed   By: Tiburcio Pea M.D.   On: 10/13/2021 04:50    Microbiology: Recent Results (from the past 240 hour(s))  Resp Panel by RT-PCR (Flu A&B, Covid) Nasopharyngeal Swab     Status: None   Collection Time: 10/13/21  1:27 AM   Specimen: Nasopharyngeal Swab; Nasopharyngeal(NP) swabs in vial transport medium  Result Value Ref Range Status   SARS Coronavirus 2 by RT PCR NEGATIVE NEGATIVE Final    Comment: (NOTE) SARS-CoV-2 target nucleic acids are NOT DETECTED.  The SARS-CoV-2 RNA is generally detectable in upper respiratory specimens during the acute phase of infection. The lowest concentration of SARS-CoV-2 viral copies this assay can detect is 138 copies/mL. A negative result does not  preclude SARS-Cov-2 infection and should not be used as the sole basis for treatment or other patient management decisions. A negative result may occur with  improper specimen collection/handling, submission of specimen other than nasopharyngeal swab, presence of viral mutation(s) within the areas targeted by this assay, and inadequate number of viral copies(<138 copies/mL). A negative result must be combined with clinical observations, patient history, and epidemiological information. The expected result is Negative.  Fact Sheet for Patients:  BloggerCourse.com  Fact Sheet for Healthcare Providers:  SeriousBroker.it  This test is no t yet approved or cleared by the [place] FDA and  has been authorized for detection and/or diagnosis of SARS-CoV-2 by FDA under an Emergency Use Authorization (EUA). This EUA will remain  in effect (meaning this test can be used) for the duration of the COVID-19 declaration under  Section 564(b)(1) of the Act, 21 U.S.C.section 360bbb-3(b)(1), unless the authorization is terminated  or revoked sooner.       Influenza A by PCR NEGATIVE NEGATIVE Final   Influenza B by PCR NEGATIVE NEGATIVE Final    Comment: (NOTE) The Xpert Xpress SARS-CoV-2/FLU/RSV plus assay is intended as an aid in the diagnosis of influenza from Nasopharyngeal swab specimens and should not be used as a sole basis for treatment. Nasal washings and aspirates are unacceptable for Xpert Xpress SARS-CoV-2/FLU/RSV testing.  Fact Sheet for Patients: EntrepreneurPulse.com.au  Fact Sheet for Healthcare Providers: IncredibleEmployment.be  This test is not yet approved or cleared by the [place] FDA and has been authorized for detection and/or diagnosis of SARS-CoV-2 by FDA under an Emergency Use Authorization (EUA). This EUA will remain in effect (meaning this test can be used) for the  duration of the COVID-19 declaration under Section 564(b)(1) of the Act, 21 U.S.C. section 360bbb-3(b)(1), unless the authorization is terminated or revoked.  Performed at [facility], [address] [place], [place] [address]      Labs: CBC: Recent Labs  Lab 10/13/21 0427 10/14/21 0230 10/15/21 0140 10/16/21 0504 10/18/21 0327  WBC 5.3 7.0 5.6 3.6* 3.9*  NEUTROABS  --  5.0 2.9 1.7  --   HGB 15.7 15.6 14.0 12.6* 13.4  HCT 44.5 45.1 39.4 34.7* 36.4*  MCV 89.9 91.3 90.6 89.9 89.0  PLT 204 172 167 160 [id number]   Basic Metabolic Panel: Recent Labs  Lab 10/12/21 1933 10/13/21 0427 10/14/21 0230 10/15/21 0140 10/16/21 0504 10/18/21 0327  NA 138 139 138 136 138 132*  K 3.7 3.9 3.7 3.2* 3.3* 3.4*  CL 103 106 104 102 104 101  CO2 25 26 27 26 29 26   GLUCOSE 114* 138* 96 161* 171* 127*  BUN 10 10 8 8 7 6   CREATININE 0.72 0.68 0.68 0.77 0.75 0.77  CALCIUM 9.6 9.4 8.9 8.9 8.7* 8.8*  MG 1.8  --   --   --  1.9 2.1  PHOS  --   --   --   --  2.5 3.2   Liver Function Tests: Recent Labs  Lab 10/12/21 1933 10/13/21 0427 10/14/21 0230 10/15/21 0140 10/16/21 0504  AST 55* 51* 39 37 34  ALT 32 28 25 23 19   ALKPHOS 87 92 75 68 48  BILITOT 0.9 1.0 0.8 1.0 0.7  PROT 8.4* 8.3* 7.8 7.8 6.4*  ALBUMIN 4.4 4.3 3.9 3.9 3.1*   Recent Labs  Lab 10/12/21 1933 10/13/21 0427 10/14/21 0230 10/15/21 0140 10/16/21 0504  LIPASE 118* 173* 106* 44 33   No results for input(s): AMMONIA in the last 168 hours. Cardiac Enzymes: No results for input(s): CKTOTAL, CKMB, CKMBINDEX, TROPONINI in the last 168 hours. BNP (last 3 results) No results for input(s): BNP in the last 8760 hours. CBG: No results for input(s): GLUCAP in the last 168 hours.  Time spent: 35 minutes  Signed:  Val Riles  [facility]  10/18/2021 12:16 PM

## 2021-10-18 NOTE — Progress Notes (Signed)
Discharge Note: Reviewed discharge instructions with pt. Pt verbalized understanding. Reviewed location of med management. IV cath intact upon removal. Pt discharged with all personal belongings. Staff wheeled pt out. Pt transported to home via family vehicle.

## 2021-10-19 ENCOUNTER — Other Ambulatory Visit: Payer: Self-pay

## 2021-11-16 ENCOUNTER — Ambulatory Visit: Payer: Self-pay | Admitting: Pharmacist

## 2021-11-17 ENCOUNTER — Other Ambulatory Visit: Payer: Self-pay

## 2021-11-17 ENCOUNTER — Ambulatory Visit (INDEPENDENT_AMBULATORY_CARE_PROVIDER_SITE_OTHER): Payer: Self-pay | Admitting: Pharmacist

## 2021-11-17 DIAGNOSIS — B182 Chronic viral hepatitis C: Secondary | ICD-10-CM

## 2021-11-17 NOTE — Progress Notes (Signed)
11/17/2021  HPI: Bruce [name] is a 51 y.o. male who presents to the [facility] pharmacy clinic for Hepatitis C follow-up.  Medication: Mavyret  Start Date: Exact date unknown. Patient started late: end of November or early December.   Hepatitis C Genotype: 1b  Fibrosis Score: F4  Hepatitis C RNA: 31,800,000 IU/mL in August 2022  Patient Active Problem List   Diagnosis Date Noted   Underinsured 06/29/2021   BPH (benign prostatic hyperplasia) 12/23/2020   Atrial fibrillation with RVR (HCC) 12/21/2020   Alcohol-induced pancreatitis 09/28/2020   Recurrent pancreatitis 06/29/2020   Alcohol induced acute pancreatitis without necrosis or infection 05/03/2020   Polysubstance abuse (HCC)    Alcohol abuse    Acute pancreatitis 01/12/2020   Thrombocytopenia (HCC) 06/25/2019   Elevated troponin    Pancreatitis 06/23/2019   AF (paroxysmal atrial fibrillation) (HCC) 09/13/2018   Atrial fibrillation (HCC) 03/19/2018   Abnormality of gait 02/12/2013   Scrotal mass 05/22/2012   Dental caries 05/22/2012   Hepatitis C infection 05/16/2012   Arthralgia 05/06/2012   Neutropenia, unspecified (HCC) 05/01/2012   Spermatocele of epididymis 05/01/2012   Bilateral varicoceles 05/01/2012   Bilateral hydrocele 05/01/2012    Patient's Medications  New Prescriptions   No medications on file  Previous Medications   CYANOCOBALAMIN (B-12 PO)    Take by mouth as needed.   DILTIAZEM (CARDIZEM CD) 120 MG 24 HR CAPSULE    Take 1 capsule (120 mg total) by mouth once daily.   GLECAPREVIR-PIBRENTASVIR (MAVYRET) 100-40 MG TABS    Take 3 tablets by mouth daily with breakfast.   PANTOPRAZOLE (PROTONIX) 40 MG TABLET    Take 1 tablet (40 mg total) by mouth once daily for 14 days.   TAMSULOSIN (FLOMAX) 0.4 MG CAPS CAPSULE    Take 1 capsule (0.4 mg total) by mouth daily after breakfast.  Modified Medications   No medications on file  Discontinued Medications   No medications on file     Allergies: Allergies  Allergen Reactions   Penicillins Other (See Comments)    Did it involve swelling of the face/tongue/throat, SOB, or low BP? No Did it involve sudden or severe rash/hives, skin peeling, or any reaction on the inside of your mouth or nose? No Did you need to seek medical attention at a hospital or doctor's office? No When did it last happen?       If all above answers are "NO", may proceed with cephalosporin use.    Past Medical History: Past Medical History:  Diagnosis Date   A-fib [facility])    Dental caries    Hepatitis C infection    Iritis    PAF (paroxysmal atrial fibrillation) (HCC)    a. diagnosed 03/19/18; b. CHADS2VASc 0   Polysubstance abuse (HCC)    a. cocaine, tobacco, etoh   Scrotal mass    Tick borne fever    Uveitis     Social History: Social History   Socioeconomic History   Marital status: Single    Spouse name: Not on file   Number of children: 0   Years of education: Not on file   Highest education level: Not on file  Occupational History    Employer: UNEMPLOYE  Tobacco Use   Smoking status: Some Days    Packs/day: 0.25    Types: Cigarettes   Smokeless tobacco: Never   Tobacco comments:    Per pt  " I only smoke cigarettes when I drink beer"  Vaping Use  Vaping Use: Never used  Substance and Sexual Activity   Alcohol use: Yes    Comment: 6 pack per week   Drug use: Yes    Types: IV, Cocaine    Comment: Pt states that he last used "a while back"   Sexual activity: Not on file  Other Topics Concern   Not on file  Social History Narrative   Lives in [place]; with fiance. 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; cleaning foreclosed houses;    Social Determinants of Health   Financial Resource Strain: Not on file  Food Insecurity: Not on file  Transportation Needs: Not on file  Physical Activity: Not on file  Stress: Not on file  Social Connections: Not on file    Labs: Hepatitis C Lab Results  Component Value  Date   HCVGENOTYPE 1b 06/29/2021   HCVRNAPCRQN 31,800,000 (H) 06/29/2021   FIBROSTAGE F4 06/29/2021   Hepatitis B Lab Results  Component Value Date   HEPBSAG NON-REACTIVE 06/29/2021   Hepatitis A No results found for: HAV HIV Lab Results  Component Value Date   HIV Non Reactive 10/14/2021   HIV Non Reactive 06/30/2020   HIV Non Reactive 06/23/2019   HIV Non Reactive 03/20/2018   HIV NON REACTIVE 05/01/2012   Lab Results  Component Value Date   CREATININE 0.77 10/18/2021   CREATININE 0.75 10/16/2021   CREATININE 0.77 10/15/2021   CREATININE 0.68 10/14/2021   CREATININE 0.68 10/13/2021   Lab Results  Component Value Date   AST 34 10/16/2021   AST 37 10/15/2021   AST 39 10/14/2021   ALT 19 10/16/2021   ALT 23 10/15/2021   ALT 25 10/14/2021   INR 1.0 06/29/2021   INR 1.4 (H) 06/25/2019   INR 1.29 05/03/2012    Assessment: Patient presents today for HCV follow-up visit. He was prescribed Mavyret in October 2022, but started his first box late at the end of November or early December (not sure of exact date). He reports good adherence except for when he spent about 5 days in the hospital in early December. He takes the medication correctly- 3 pills once daily with food. Initially, there was concern for resistance, but it is assuring that the patient has not seemed to miss as many doses as we thought. With this in mind, we will continue with 8 weeks of therapy in total and check his HCV RNA today to ensure improvement.    Plan: Will continue Mavyret for 4 more weeks, patient given second box today Will test HCV RNA today and call patient with results per his request RTC in 4 weeks for EOT visit with Rexene Alberts, NP  Georgeann Oppenheim, PharmD Pharmacy Resident 11/17/2021, 11:15 AM

## 2021-11-20 LAB — HEPATITIS C RNA QUANTITATIVE
HCV Quantitative Log: <1.18 {Log_IU}/mL
HCV RNA, PCR, QN: <15 [IU]/mL

## 2021-12-02 ENCOUNTER — Other Ambulatory Visit: Payer: Self-pay

## 2021-12-03 ENCOUNTER — Other Ambulatory Visit: Payer: Self-pay

## 2021-12-06 ENCOUNTER — Other Ambulatory Visit: Payer: Self-pay

## 2021-12-06 ENCOUNTER — Ambulatory Visit: Payer: Self-pay | Admitting: Pharmacy Technician

## 2021-12-06 DIAGNOSIS — Z79899 Other long term (current) drug therapy: Secondary | ICD-10-CM

## 2021-12-07 ENCOUNTER — Other Ambulatory Visit: Payer: Self-pay

## 2021-12-07 NOTE — Progress Notes (Signed)
Completed [facility] application and contract.  Patient agreed to all terms of the [facility] contract.    Patient approved to receive medication assistance at [facility] until time for re-certification in 4159 and as long as eligibility criteria continues to be met.    Provided patient with Civil engineer, contracting based on his particular needs.    [facility]

## 2021-12-09 ENCOUNTER — Other Ambulatory Visit: Payer: Self-pay

## 2021-12-09 ENCOUNTER — Ambulatory Visit: Payer: Self-pay | Admitting: Gerontology

## 2021-12-09 VITALS — BP 154/98 | HR 85 | Temp 98.6°F | Resp 16 | Wt 170.5 lb

## 2021-12-09 DIAGNOSIS — I480 Paroxysmal atrial fibrillation: Secondary | ICD-10-CM

## 2021-12-09 DIAGNOSIS — Z7689 Persons encountering health services in other specified circumstances: Secondary | ICD-10-CM | POA: Insufficient documentation

## 2021-12-09 DIAGNOSIS — I10 Essential (primary) hypertension: Secondary | ICD-10-CM

## 2021-12-09 DIAGNOSIS — Z87438 Personal history of other diseases of male genital organs: Secondary | ICD-10-CM

## 2021-12-09 DIAGNOSIS — R519 Headache, unspecified: Secondary | ICD-10-CM | POA: Insufficient documentation

## 2021-12-09 DIAGNOSIS — B182 Chronic viral hepatitis C: Secondary | ICD-10-CM

## 2021-12-09 MED ORDER — DILTIAZEM HCL ER COATED BEADS 120 MG PO CP24
120.0000 mg | ORAL_CAPSULE | Freq: Every day | ORAL | 2 refills | Status: DC
  Filled 2021-12-09: qty 30, 30d supply, fill #0
  Filled 2022-01-10: qty 30, 30d supply, fill #1

## 2021-12-09 MED ORDER — IBUPROFEN 400 MG PO TABS
400.0000 mg | ORAL_TABLET | Freq: Two times a day (BID) | ORAL | 0 refills | Status: DC
  Filled 2021-12-09: qty 30, 15d supply, fill #0

## 2021-12-09 MED ORDER — TAMSULOSIN HCL 0.4 MG PO CAPS
0.4000 mg | ORAL_CAPSULE | Freq: Every day | ORAL | 0 refills | Status: DC
Start: 2021-12-09 — End: 2022-01-12

## 2021-12-09 NOTE — Patient Instructions (Signed)

## 2021-12-09 NOTE — Progress Notes (Signed)
New Patient Office Visit  Subjective:  [name], male    DOB: 27-Jan-1971  Age: 51 y.o. MRN: [id number]  CC: No chief complaint on file.   HPI Bruce [name]  is a 51 y/o male who has history of A-fib, Hepatitis C infection, Polysubstance use disorder, scrotal mass, Uveitis, presents to establish care. He was discharged from the hospital on 10/18/21 after been treated for alcoholic induced pancreatitis. Currently, he denies abdominal pain and alcohol consumption. He states that he's being experiencing  constant headache to the frontal part of his head. He states that the headache has been going on for 3 months. He describes headache as  non radiaitng, throbbing, 8/10 pain. He  states that taking 1000 mg tylenol bid relieves symptoms. He denies nausea, vomiting, and vision changes. He takes Diltiazem 120 mg daily for Afib and hypertension, and has been out for 3 days. He states that he checks his blood pressure at home He has a history of Hepatitis C and continues to take Mavyret daily. He also has a history of BPH, takes Flomax, but has not followed up with Urologist. Overall, he states that he's doing well and offers no further complaint.  Past Medical History:  Diagnosis Date   A-fib [facility])    Dental caries    Hepatitis C infection    Iritis    PAF (paroxysmal atrial fibrillation) (HCC)    a. diagnosed 03/19/18; b. CHADS2VASc 0   Polysubstance abuse (HCC)    a. cocaine, tobacco, etoh   Scrotal mass    Tick borne fever    Uveitis     Past Surgical History:  Procedure Laterality Date   FINGER SURGERY     infection, middle left   fractured jaw     SHOULDER SURGERY     right    Family History  Problem Relation Age of Onset   Drug abuse Father        throat cancer    Social History   Socioeconomic History   Marital status: Single    Spouse name: Not on file   Number of children: 0   Years of education: Not on file   Highest education level: Not on  file  Occupational History    Employer: UNEMPLOYE  Tobacco Use   Smoking status: Some Days    Packs/day: 0.25    Types: Cigarettes   Smokeless tobacco: Never   Tobacco comments:    Per pt  " I only smoke cigarettes when I drink beer"  Vaping Use   Vaping Use: Never used  Substance and Sexual Activity   Alcohol use: Yes    Comment: 6 pack per week   Drug use: Yes    Types: IV, Cocaine    Comment: Pt states that he last used "a while back"   Sexual activity: Not on file  Other Topics Concern   Not on file  Social History Narrative   Lives in [place]; with fiance. 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; cleaning foreclosed houses;    Social Determinants of Health   Financial Resource Strain: Not on file  Food Insecurity: Not on file  Transportation Needs: Not on file  Physical Activity: Not on file  Stress: Not on file  Social Connections: Not on file  Intimate Partner Violence: Not on file    ROS Review of Systems  Constitutional: Negative.   HENT: Negative.    Eyes: Negative.   Respiratory:  Negative.    Cardiovascular: Negative.   Gastrointestinal: Negative.   Endocrine: Negative.   Genitourinary: Negative.   Musculoskeletal: Negative.   Skin: Negative.   Neurological:  Positive for headaches.  Hematological: Negative.   Psychiatric/Behavioral: Negative.     Objective:   Today's Vitals: BP (!) 154/98 (BP Location: Left Arm, Patient Position: Sitting, Cuff Size: Large)    Pulse 85    Temp 98.6 F (37 C)    Resp 16    Wt 170 lb 8 oz (77.3 kg)    SpO2 98%    BMI 26.70 kg/m   Physical Exam HENT:     Head: Normocephalic and atraumatic.     Nose: Nose normal.     Mouth/Throat:     Mouth: Mucous membranes are moist.  Eyes:     Extraocular Movements: Extraocular movements intact.     Conjunctiva/sclera: Conjunctivae normal.     Pupils: Pupils are equal, round, and reactive to light.  Cardiovascular:     Rate and Rhythm: Normal rate and regular rhythm.      Pulses: Normal pulses.     Heart sounds: Normal heart sounds.  Pulmonary:     Effort: Pulmonary effort is normal.     Breath sounds: Normal breath sounds.  Abdominal:     General: Abdomen is flat. Bowel sounds are normal.     Palpations: Abdomen is soft.  Genitourinary:    Comments: Deferred per patient Musculoskeletal:        General: Normal range of motion.     Cervical back: Normal range of motion.  Skin:    General: Skin is warm.  Neurological:     General: No focal deficit present.     Mental Status: He is alert and oriented to person, place, and time. Mental status is at baseline.  Psychiatric:        Mood and Affect: Mood normal.        Behavior: Behavior normal.        Thought Content: Thought content normal.        Judgment: Judgment normal.    Assessment & Plan:    1. Encounter to establish care -Routine labs will be checked - CBC w/Diff; Future - Comp Met (CMET); Future - HgB A1c; Future - Lipid panel; Future - Lipid panel - HgB A1c - Comp Met (CMET) - CBC w/Diff  2. Essential hypertension - His blood pressure is not under control, he will continue on current medication, DASH diet and exercise as tolerated. - diltiazem (CARDIZEM CD) 120 MG 24 hr capsule; Take 1 capsule (120 mg total) by mouth once daily.  Dispense: 30 capsule; Refill: 2  3. AF (paroxysmal atrial fibrillation) (HCC) - He will continue on current medication, and follow up with a cardiologist. - diltiazem (CARDIZEM CD) 120 MG 24 hr capsule; Take 1 capsule (120 mg total) by mouth once daily.  Dispense: 30 capsule; Refill: 2 - Ambulatory referral to Cardiology  4. Chronic hepatitis C without hepatic coma (HCC) - He will continue his treatment and follow up with Gastroenterologist.  5. History of BPH - He will continue on Flomax and will follow up with Urologist. - tamsulosin (FLOMAX) 0.4 MG CAPS capsule; Take 1 capsule (0.4 mg total) by mouth daily after breakfast.  Dispense: 90 capsule;  Refill: 0 - Ambulatory referral to Urology - PSA; Future - PSA  6. Nonintractable headache, unspecified chronicity pattern, unspecified headache type - Unknown etiology, will continue Ibuprofen 400 mg bid, educated on medication  side effects and advised to notify clinic. He was advised to go to the ED for worsening symptoms. - ibuprofen (ADVIL) 400 MG tablet; Take 1 tablet (400 mg total) by mouth 2 (two) times daily.  Dispense: 30 tablet; Refill: 0     Follow-up: Return in about 26 days (around 01/04/2022), or if symptoms worsen or fail to improve.   Ascencion Coye Trellis Paganini, NP

## 2021-12-10 ENCOUNTER — Other Ambulatory Visit: Payer: Self-pay

## 2021-12-10 LAB — LIPID PANEL
Chol/HDL Ratio: 2.5 ratio (ref 0.0–5.0)
Cholesterol, Total: 132 mg/dL (ref 100–199)
HDL: 52 mg/dL (ref >39)
LDL Chol Calc (NIH): 58 mg/dL (ref 0–99)
Triglycerides: 127 mg/dL (ref 0–149)
VLDL Cholesterol Cal: 22 mg/dL (ref 5–40)

## 2021-12-10 LAB — COMPREHENSIVE METABOLIC PANEL
ALT: 32 IU/L (ref 0–44)
AST: 41 IU/L — ABNORMAL HIGH (ref 0–40)
Albumin/Globulin Ratio: 1.1 — ABNORMAL LOW (ref 1.2–2.2)
Albumin: 4.2 g/dL (ref 3.8–4.9)
Alkaline Phosphatase: 115 IU/L (ref 44–121)
BUN/Creatinine Ratio: 9 (ref 9–20)
BUN: 8 mg/dL (ref 6–24)
Bilirubin Total: 0.4 mg/dL (ref 0.0–1.2)
CO2: 23 mmol/L (ref 20–29)
Calcium: 9.6 mg/dL (ref 8.7–10.2)
Chloride: 101 mmol/L (ref 96–106)
Creatinine, Ser: 0.88 mg/dL (ref 0.76–1.27)
Globulin, Total: 3.8 g/dL (ref 1.5–4.5)
Glucose: 103 mg/dL — ABNORMAL HIGH (ref 70–99)
Potassium: 3.7 mmol/L (ref 3.5–5.2)
Sodium: 140 mmol/L (ref 134–144)
Total Protein: 8 g/dL (ref 6.0–8.5)
eGFR: 103 mL/min/{1.73_m2} (ref >59)

## 2021-12-10 LAB — CBC WITH DIFFERENTIAL/PLATELET
Basophils Absolute: 0 10*3/uL (ref 0.0–0.2)
Basos: 1 %
EOS (ABSOLUTE): 0.1 10*3/uL (ref 0.0–0.4)
Eos: 3 %
Hematocrit: 44.3 % (ref 37.5–51.0)
Hemoglobin: 15.2 g/dL (ref 13.0–17.7)
Immature Grans (Abs): 0 10*3/uL (ref 0.0–0.1)
Immature Granulocytes: 0 %
Lymphocytes Absolute: 2.5 10*3/uL (ref 0.7–3.1)
Lymphs: 49 %
MCH: 31.5 pg (ref 26.6–33.0)
MCHC: 34.3 g/dL (ref 31.5–35.7)
MCV: 92 fL (ref 79–97)
Monocytes Absolute: 0.6 10*3/uL (ref 0.1–0.9)
Monocytes: 11 %
Neutrophils Absolute: 1.9 10*3/uL (ref 1.4–7.0)
Neutrophils: 36 %
Platelets: 236 10*3/uL (ref 150–450)
RBC: 4.82 x10E6/uL (ref 4.14–5.80)
RDW: 12.9 % (ref 11.6–15.4)
WBC: 5.2 10*3/uL (ref 3.4–10.8)

## 2021-12-10 LAB — HEMOGLOBIN A1C
Est. average glucose Bld gHb Est-mCnc: 123 mg/dL
Hgb A1c MFr Bld: 5.9 % — ABNORMAL HIGH (ref 4.8–5.6)

## 2021-12-10 LAB — PSA: Prostate Specific Ag, Serum: 1.8 ng/mL (ref 0.0–4.0)

## 2021-12-17 ENCOUNTER — Encounter: Payer: Self-pay | Admitting: *Deleted

## 2021-12-21 ENCOUNTER — Ambulatory Visit: Payer: Self-pay | Admitting: Gerontology

## 2021-12-23 ENCOUNTER — Ambulatory Visit: Payer: Self-pay | Admitting: Infectious Diseases

## 2021-12-27 ENCOUNTER — Other Ambulatory Visit: Payer: Self-pay

## 2022-01-10 ENCOUNTER — Ambulatory Visit: Payer: Self-pay | Admitting: Infectious Diseases

## 2022-01-10 ENCOUNTER — Other Ambulatory Visit: Payer: Self-pay

## 2022-01-10 ENCOUNTER — Other Ambulatory Visit: Payer: Self-pay | Admitting: Gerontology

## 2022-01-10 DIAGNOSIS — R519 Headache, unspecified: Secondary | ICD-10-CM

## 2022-01-11 ENCOUNTER — Encounter: Payer: Self-pay | Admitting: Infectious Diseases

## 2022-01-11 ENCOUNTER — Ambulatory Visit (INDEPENDENT_AMBULATORY_CARE_PROVIDER_SITE_OTHER): Payer: Self-pay | Admitting: Infectious Diseases

## 2022-01-11 ENCOUNTER — Other Ambulatory Visit: Payer: Self-pay

## 2022-01-11 VITALS — BP 122/81 | HR 76 | Resp 16 | Ht 67.0 in | Wt 168.9 lb

## 2022-01-11 DIAGNOSIS — Z23 Encounter for immunization: Secondary | ICD-10-CM

## 2022-01-11 DIAGNOSIS — Z87438 Personal history of other diseases of male genital organs: Secondary | ICD-10-CM

## 2022-01-11 DIAGNOSIS — B182 Chronic viral hepatitis C: Secondary | ICD-10-CM

## 2022-01-11 MED FILL — Ibuprofen Tab 400 MG: ORAL | 15 days supply | Qty: 30 | Fill #0 | Status: AC

## 2022-01-11 NOTE — Patient Instructions (Addendum)
Nice to see you again.  ? ?We gave you your first dose of hepatitis b vaccine --> your next dose we can get you in 1 month.  ?We gave you your pneumonia vaccine today  ? ?Will see you back in 3-4 months to recheck your hepatitis C levels - this will be your cure visit! ?Schedule a lab visit about 2 weeks before your visit with me so we can review results together ? ?Talk with your regular doctor tomorrow about renewing your Flomax medication for your urinary symptoms.  ?

## 2022-01-11 NOTE — Assessment & Plan Note (Signed)
Briefly discussed today - he will follow up with PCP tomorrow to renew Rx for Flomax ?

## 2022-01-11 NOTE — Assessment & Plan Note (Signed)
Completed his course of mavyret x 8 weeks. Will check EOT viral load today and have him back for SVR test of cure in 3-4 months. FibroTest at beginning of treatment indicated F4 but may be impacted from his chronic pancreatitis not so much reflective of true liver fibrosis. Elastography did not reveal any concerns for cirrhosis and normal elastography.  ?heplisav #1 today with second dose after 4 weeks.  ?Prevnar 20 today to complete pneumococcal series.  ? ?

## 2022-01-11 NOTE — Progress Notes (Signed)
? ?Patient Name: Bruce [name]  ?Date of Birth: Nov 08, 1970  ?MRN: [id number]  ?PCP: Patient, No Pcp Per (Inactive)  ?Referring Provider: No ref. provider found, Ph#: N/A ? ? ?CC:  ?Chronic hepatitis C follow up.  ? ? ?HPI/ROS:  ?Bruce [name] is a 51 y.o. male with chronic hepatitis C GT 1b,  ? ?Hospitalization in Dec 2022 for ETOH related pancreatitis.  ? ?Mavyret started around end of November/early December 2022. Finished it a few weeks ago. Does not think he  ? ?Hep C RNA levels:  ?06/2021 - 31,800,000 ?11/2021 - < 15 copies  ? ?Has been urinating a lot throughout the day - feels like he is always in the bathroom. Feels that he has to 'push' out  ? ? ? ?Review of Systems  ?Constitutional:  Negative for appetite change, chills, fatigue, fever and unexpected weight change.  ?Eyes:  Negative for visual disturbance.  ?Respiratory:  Negative for cough and shortness of breath.   ?Cardiovascular:  Negative for chest pain and leg swelling.  ?Gastrointestinal:  Negative for abdominal pain, diarrhea and nausea.  ?Genitourinary:  Positive for decreased urine volume and frequency. Negative for dysuria, genital sores, penile discharge and urgency.  ?Musculoskeletal:  Negative for joint swelling.  ?Skin:  Negative for color change and rash.  ?Neurological:  Negative for dizziness and headaches.  ?Hematological:  Negative for adenopathy.  ?Psychiatric/Behavioral:  Negative for sleep disturbance. The patient is not nervous/anxious.   ? ?All other systems reviewed and are negative   ?   ?Past Medical History:  ?Diagnosis Date  ? A-fib (HCC)   ? Dental caries   ? Hepatitis C infection   ? Iritis   ? PAF (paroxysmal atrial fibrillation) (HCC)   ? a. diagnosed 03/19/18; b. CHADS2VASc 0  ? Polysubstance abuse (HCC)   ? a. cocaine, tobacco, etoh  ? Scrotal mass   ? Tick borne fever   ? Uveitis   ? ? ?Prior to Admission medications   ?Medication Sig Start Date End Date Taking? Authorizing Provider  ?Cyanocobalamin (B-12 PO) Take  by mouth as needed.    [provider]  ?diltiazem (CARDIZEM CD) 120 MG 24 hr capsule Take 1 capsule (120 mg total) by mouth daily. 06/09/21 07/09/21  Furth, Cadence H, PA-C  ? ? ?Allergies  ?Allergen Reactions  ? Penicillins Other (See Comments)  ?  Did it involve swelling of the face/tongue/throat, SOB, or low BP? No ?Did it involve sudden or severe rash/hives, skin peeling, or any reaction on the inside of your mouth or nose? No ?Did you need to seek medical attention at a hospital or doctor's office? No ?When did it last happen?       ?If all above answers are "NO", may proceed with cephalosporin use.  ? ? ?Social History  ? ?Tobacco Use  ? Smoking status: Some Days  ?  Packs/day: 0.25  ?  Types: Cigarettes  ? Smokeless tobacco: Never  ? Tobacco comments:  ?  Per pt  " I only smoke cigarettes when I drink beer"  ?Vaping Use  ? Vaping Use: Never used  ?Substance Use Topics  ? Alcohol use: Yes  ?  Comment: 6 pack per week  ? Drug use: Yes  ?  Types: IV, Cocaine  ?  Comment: Pt states that he last used "a while back"  ? ? ?Family History  ?Problem Relation Age of Onset  ? Drug abuse Father   ?  throat cancer  ? ? ? ?Objective:  ? ?Vitals:  ? 01/11/22 1001  ?BP: 122/81  ?Pulse: 76  ?Resp: 16  ?SpO2: 100%  ? ?Constitutional: in no apparent distress, well developed and well nourished, and oriented times 3 ?Eyes: anicteric ?Cardiovascular: Cor RRR ?Respiratory: clear ?Gastrointestinal: Bowel sounds are normal, liver is not enlarged, spleen is not enlarged ?Musculoskeletal: peripheral pulses normal, no pedal edema, no clubbing or cyanosis ?Skin: negative for - jaundice, spider hemangioma, telangiectasia, palmar erythema, ecchymosis and atrophy; no porphyria cutanea tarda ?Lymphatic: no cervical lymphadenopathy ? ? ?Laboratory: ?Genotype:  ?Lab Results  ?Component Value Date  ? HCVGENOTYPE 1b 06/29/2021  ? ?HCV viral load:  ?Lab Results  ?Component Value Date  ? HCVQUANT 700,490 (H) 05/22/2012  ? ?Lab Results   ?Component Value Date  ? WBC 5.2 12/09/2021  ? HGB 15.2 12/09/2021  ? HCT 44.3 12/09/2021  ? MCV 92 12/09/2021  ? PLT 236 12/09/2021  ?  ?Lab Results  ?Component Value Date  ? CREATININE 0.88 12/09/2021  ? BUN 8 12/09/2021  ? NA 140 12/09/2021  ? K 3.7 12/09/2021  ? CL 101 12/09/2021  ? CO2 23 12/09/2021  ?  ?Lab Results  ?Component Value Date  ? ALT 32 12/09/2021  ? AST 41 (H) 12/09/2021  ? GGT 126 (H) 06/29/2021  ? ALKPHOS 115 12/09/2021  ?  ?Lab Results  ?Component Value Date  ? INR 1.0 06/29/2021  ? BILITOT 0.4 12/09/2021  ? ALBUMIN 4.2 12/09/2021  ? ? ?Imaging:  ?CT scan in February 2022 as outlined above. +Hepatic steatosis ? ? ?Assessment & Plan:  ? ?Problem List Items Addressed This Visit   ? ?  ? Unprioritized  ? Hepatitis C infection - Primary  ?  Completed his course of mavyret x 8 weeks. Will check EOT viral load today and have him back for SVR test of cure in 3-4 months. FibroTest at beginning of treatment indicated F4 but may be impacted from his chronic pancreatitis not so much reflective of true liver fibrosis. Elastography did not reveal any concerns for cirrhosis and normal elastography.  ?heplisav #1 today with second dose after 4 weeks.  ?Prevnar 20 today to complete pneumococcal series.  ? ?  ?  ? Relevant Orders  ? Hepatitis A Ab, Total  ? Hepatitis C RNA quantitative ([facility])  ? Hepatitis C RNA quantitative ([facility])  ? Liver Fibrosis, FibroTest-ActiTest  ? History of BPH  ?  Briefly discussed today - he will follow up with PCP tomorrow to renew Rx for Flomax ?  ?  ? ?Other Visit Diagnoses   ? ? Need for hepatitis B booster vaccination      ? Relevant Orders  ? Heplisav-B (HepB-CPG) Vaccine (Completed)  ? Need for pneumococcal vaccination      ? Relevant Orders  ? Pneumococcal conjugate vaccine 20-valent (Prevnar-20) (Completed)  ? ?  ? ? ? ?Rexene Alberts, MSN, NP-C ?[facility] ?[facility]  [email or web address] ?Pager: [phone] ?Office:  [phone] ?[facility] Main Line: [phone] ? ?

## 2022-01-12 ENCOUNTER — Other Ambulatory Visit: Payer: Self-pay

## 2022-01-12 ENCOUNTER — Ambulatory Visit: Payer: Self-pay | Admitting: Gerontology

## 2022-01-12 ENCOUNTER — Encounter: Payer: Self-pay | Admitting: Gerontology

## 2022-01-12 VITALS — BP 120/85 | HR 102 | Temp 98.5°F | Resp 16 | Ht 67.0 in | Wt 164.7 lb

## 2022-01-12 DIAGNOSIS — I480 Paroxysmal atrial fibrillation: Secondary | ICD-10-CM

## 2022-01-12 DIAGNOSIS — R112 Nausea with vomiting, unspecified: Secondary | ICD-10-CM | POA: Insufficient documentation

## 2022-01-12 DIAGNOSIS — I10 Essential (primary) hypertension: Secondary | ICD-10-CM

## 2022-01-12 DIAGNOSIS — Z87438 Personal history of other diseases of male genital organs: Secondary | ICD-10-CM

## 2022-01-12 DIAGNOSIS — R7303 Prediabetes: Secondary | ICD-10-CM | POA: Insufficient documentation

## 2022-01-12 MED ORDER — TAMSULOSIN HCL 0.4 MG PO CAPS
0.4000 mg | ORAL_CAPSULE | Freq: Every day | ORAL | 1 refills | Status: DC
  Filled 2022-01-12 – 2022-02-18 (×2): qty 30, 30d supply, fill #0

## 2022-01-12 MED ORDER — DILTIAZEM HCL ER COATED BEADS 120 MG PO CP24
120.0000 mg | ORAL_CAPSULE | Freq: Every day | ORAL | 2 refills | Status: DC
  Filled 2022-01-12 – 2022-02-18 (×2): qty 30, 30d supply, fill #0

## 2022-01-12 MED ORDER — ONDANSETRON HCL 4 MG PO TABS
4.0000 mg | ORAL_TABLET | Freq: Three times a day (TID) | ORAL | 0 refills | Status: DC | PRN
  Filled 2022-01-12: qty 10, 4d supply, fill #0

## 2022-01-12 NOTE — Progress Notes (Signed)
Established Patient Office Visit  Subjective:  [name], male    DOB: 07/06/1971  Age: 51 y.o. MRN: [id number]  CC:  Chief Complaint  Patient presents with   Follow-up    Labs drawn 12/09/21   Generalized Body Aches    Patient c/o generalized body aches, headache x 1 day. Patient saw Infectious Disease yesterday and was given Heplisav and Prevnar yesterday.    HPI Bruce [name]   is a 51 y/o male who has history of A-fib, Hepatitis C infection, Polysubstance use disorder, scrotal mass, Uveitis, presents for lab review. His HgbA1c done on 12/09/21 was 5.9%, and the rest were unremarkable. He was seen at the Infectious Disease clinic on 01/11/22 by Fidela Salisbury NP for chronic hepatitis C, he completed the course of Mavyret for 8 weeks and he received Prevnar vaccine too. He c/o generalized body ache and nausea that started yesterday and he received Prevnar and Hep B vaccines yesterday. He was nauseous and vomited clear liquids during visit. Overall, he states that he was weak, and offers no further complaint.  Past Medical History:  Diagnosis Date   A-fib [facility])    Dental caries    Hepatitis C infection    Iritis    PAF (paroxysmal atrial fibrillation) (HCC)    a. diagnosed 03/19/18; b. CHADS2VASc 0   Polysubstance abuse (HCC)    a. cocaine, tobacco, etoh   Scrotal mass    Tick borne fever    Uveitis     Past Surgical History:  Procedure Laterality Date   FINGER SURGERY     infection, middle left   fractured jaw     SHOULDER SURGERY     right    Family History  Problem Relation Age of Onset   Schizophrenia Mother    Drug abuse Father        throat cancer    Social History   Socioeconomic History   Marital status: Single    Spouse name: Not on file   Number of children: 0   Years of education: Not on file   Highest education level: Not on file  Occupational History    Employer: UNEMPLOYE  Tobacco Use   Smoking status: Former    Packs/day: 0.25     Types: Cigarettes    Quit date: 12/2021    Years since quitting: 0.0   Smokeless tobacco: Never   Tobacco comments:    Per pt  " I only smoke cigarettes when I drink beer"  Vaping Use   Vaping Use: Never used  Substance and Sexual Activity   Alcohol use: Not Currently    Comment: 6 pack per week   Drug use: Not Currently    Types: IV, Cocaine    Comment: Pt states that he last used "a while back"   Sexual activity: Not on file  Other Topics Concern   Not on file  Social History Narrative   Lives in [place]; with fiance. 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; cleaning foreclosed houses;    Social Determinants of Health   Financial Resource Strain: Not on file  Food Insecurity: No Food Insecurity   Worried About Programme researcher, broadcasting/film/video in the Last Year: Never true   Ran Out of Food in the Last Year: Never true  Transportation Needs: No Transportation Needs   Lack of Transportation (Medical): No   Lack of Transportation (Non-Medical): No  Physical Activity: Not on file  Stress: Not on file  Social Connections: Not on file  Intimate Partner Violence: Not on file    Outpatient Medications Prior to Visit  Medication Sig Dispense Refill   Acetaminophen (TYLENOL PO) Take by mouth daily as needed.     ibuprofen (ADVIL) 400 MG tablet Take 1 tablet (400 mg total) by mouth 2 (two) times daily. 30 tablet 0   diltiazem (CARDIZEM CD) 120 MG 24 hr capsule Take 1 capsule (120 mg total) by mouth once daily. 30 capsule 2   Glecaprevir-Pibrentasvir (MAVYRET) 100-40 MG TABS Take 3 tablets by mouth daily with breakfast. 84 tablet 1   tamsulosin (FLOMAX) 0.4 MG CAPS capsule Take 1 capsule (0.4 mg total) by mouth once daily after breakfast. (Patient not taking: Reported on 01/12/2022) 90 capsule 0   No facility-administered medications prior to visit.    Allergies  Allergen Reactions   Penicillins Other (See Comments)    Did it involve swelling of the face/tongue/throat, SOB, or low BP?  No Did it involve sudden or severe rash/hives, skin peeling, or any reaction on the inside of your mouth or nose? No Did you need to seek medical attention at a hospital or doctor's office? No When did it last happen?       If all above answers are "NO", may proceed with cephalosporin use.    ROS Review of Systems  Constitutional:  Positive for fatigue.  Respiratory: Negative.    Cardiovascular: Negative.   Gastrointestinal:  Positive for nausea and vomiting.  Neurological: Negative.      Objective:    Physical Exam HENT:     Head: Normocephalic and atraumatic.     Mouth/Throat:     Mouth: Mucous membranes are moist.  Eyes:     Extraocular Movements: Extraocular movements intact.     Conjunctiva/sclera: Conjunctivae normal.     Pupils: Pupils are equal, round, and reactive to light.  Cardiovascular:     Rate and Rhythm: Normal rate and regular rhythm.     Pulses: Normal pulses.     Heart sounds: Normal heart sounds.  Pulmonary:     Effort: Pulmonary effort is normal.     Breath sounds: Normal breath sounds.  Neurological:     General: No focal deficit present.     Mental Status: He is alert and oriented to person, place, and time. Mental status is at baseline.  Psychiatric:        Mood and Affect: Mood normal.        Behavior: Behavior normal.        Thought Content: Thought content normal.        Judgment: Judgment normal.    BP 120/85 (BP Location: Right Arm, Patient Position: Sitting, Cuff Size: Large)    Pulse (!) 102    Temp 98.5 F (36.9 C) (Oral)    Resp 16    Ht 5\' 7"  (1.702 m)    Wt 164 lb 11.2 oz (74.7 kg)    SpO2 97%    BMI 25.80 kg/m  Wt Readings from Last 3 Encounters:  01/12/22 164 lb 11.2 oz (74.7 kg)  01/11/22 168 lb 14.4 oz (76.6 kg)  12/09/21 170 lb 8 oz (77.3 kg)     Health Maintenance Due  Topic Date Due   COVID-19 Vaccine (1) Never done   Zoster Vaccines- Shingrix (1 of 2) Never done   COLONOSCOPY (Pts 45-29yrs Insurance coverage will  need to be confirmed)  Never done    There are no  preventive care reminders to display for this patient.  Lab Results  Component Value Date   TSH 0.219 (L) 12/21/2020   Lab Results  Component Value Date   WBC 5.2 12/09/2021   HGB 15.2 12/09/2021   HCT 44.3 12/09/2021   MCV 92 12/09/2021   PLT 236 12/09/2021   Lab Results  Component Value Date   NA 140 12/09/2021   K 3.7 12/09/2021   CO2 23 12/09/2021   GLUCOSE 103 (H) 12/09/2021   BUN 8 12/09/2021   CREATININE 0.88 12/09/2021   BILITOT 0.4 12/09/2021   ALKPHOS 115 12/09/2021   AST 41 (H) 12/09/2021   ALT 32 12/09/2021   PROT 8.0 12/09/2021   ALBUMIN 4.2 12/09/2021   CALCIUM 9.6 12/09/2021   ANIONGAP 5 10/18/2021   EGFR 103 12/09/2021   Lab Results  Component Value Date   CHOL 132 12/09/2021   Lab Results  Component Value Date   HDL 52 12/09/2021   Lab Results  Component Value Date   LDLCALC 58 12/09/2021   Lab Results  Component Value Date   TRIG 127 12/09/2021   Lab Results  Component Value Date   CHOLHDL 2.5 12/09/2021   Lab Results  Component Value Date   HGBA1C 5.9 (H) 12/09/2021      Assessment & Plan:   1. Prediabetes -His HgbA1c was 5.9%, he declines Metformin therapy, was advised to continue on low carb/non concentrated sweet diet.  2. Nausea and vomiting, unspecified vomiting type - Nausea of unknown origin, was started on Zofran, educated on medication side effects and advised to notify clinic. - ondansetron (ZOFRAN) 4 MG tablet; Take 1 tablet (4 mg total) by mouth every 8 (eight) hours as needed for nausea or vomiting.  Dispense: 10 tablet; Refill: 0  3. Essential hypertension - His blood pressure is under control, will continue on current medication, and follow up with Cardiologist tomorrow. - diltiazem (CARDIZEM CD) 120 MG 24 hr capsule; Take 1 capsule (120 mg total) by mouth once daily.  Dispense: 30 capsule; Refill: 2  4. AF (paroxysmal atrial fibrillation) (HCC) - He will  continue on current medication, and advised to follow up with Cardiologist Dr Azucena Cecil. - diltiazem (CARDIZEM CD) 120 MG 24 hr capsule; Take 1 capsule (120 mg total) by mouth once daily.  Dispense: 30 capsule; Refill: 2  5. History of BPH - He will continue on current medication,and will follow up with Urologist. - tamsulosin (FLOMAX) 0.4 MG CAPS capsule; Take 1 capsule (0.4 mg total) by mouth once daily after breakfast.  Dispense: 30 capsule; Refill: 1     Follow-up: Return in about 8 weeks (around 03/10/2022), or if symptoms worsen or fail to improve.    Alysah Carton Trellis Paganini, NP

## 2022-01-13 LAB — HEPATITIS C RNA QUANTITATIVE
HCV Quantitative Log: <1.18 log IU/mL
HCV RNA, PCR, QN: <15 IU/mL

## 2022-01-13 LAB — HEPATITIS A ANTIBODY, TOTAL: Hepatitis A AB,Total: REACTIVE — AB

## 2022-01-14 ENCOUNTER — Telehealth: Payer: Self-pay

## 2022-01-14 NOTE — Telephone Encounter (Signed)
-----   Message from Byron Callas, NP sent at 01/14/2022  8:04 AM EST ----- ?Please call Bruce [name] to let him know his labs look good! Hep c is not detected.  ?Will be sure we repeat this in 3-4 mo so we can officially say he is cured ?

## 2022-01-14 NOTE — Telephone Encounter (Signed)
Patient aware of results and verbalized his understanding.  ? ? ?Tranika Scholler P Alyx Gee, CMA ? ?

## 2022-01-27 NOTE — Progress Notes (Deleted)
?Cardiology Office Note:   ? ?Date:  01/27/2022  ? ?ID:  KEIN VANRIPER [name], DOB Dec 18, 1970, MRN [id number] ? ?PCP:  Patient, No Pcp Per (Inactive)  ?[facility] Cardiologist:  Julien Nordmann, MD  ?[facility] Electrophysiologist:  None  ? ?Referring MD: Rolm Gala, NP  ? ?Chief Complaint: PAF ? ?History of Present Illness:   ? ?Bruce [name] is a 51 y.o. male with a hx of  PAF, polysubstance abuse with ongoing cocaine, alcohol, and tobacco abuse, HTN, hepatitis C in the setting of IV drug abuse, and medication nonadherence who presents for hospital follow-up as outlined below. ? ?Our group first met him in 03/2018 during hospital admission for newly diagnosed A. fib in the context of binge drinking, cocaine, and tobacco use.  Echo at that time showed an EF of 60 to 65%, no regional wall motion abnormalities, grade 2 diastolic dysfunction, mild mitral regurgitation, normal size left atrium, normal RV systolic function and ventricular cavity size, and PASP normal.  He converted to sinus rhythm with rate control.  He was not started on OAC at that time given CHA2DS2-VASc of 0.  He was seen in the ED in 09/2018 with recurrent A. fib with RVR in the setting of ongoing cocaine use.  He was started on flecainide and continued on Cardizem.  He converted in the ED.  He was not placed on OAC.  Subsequent nuclear stress test in 10/2018 showed no evidence of ischemia and was overall low risk.  Outpatient cardiac monitoring in 10/2018 showed a predominant rhythm of sinus with an average heart rate of 85 bpm, 25 runs of SVT with the longest episode lasting 14 beats.  Patient triggered events were not associated with significant arrhythmia.  He was last seen in the office in 10/2018.  Since that time, he has been seen in the ED or admitted to the hospital numerous times with varying complaints including atypical chest pain, ongoing cocaine use, and acute alcohol-induced pancreatitis.  Echo, during admission in  06/2019 for pancreatitis, showed an EF of 55 to 60%, normal LV diastolic function parameters, normal RV systolic function and ventricular cavity size, normal PASP, and no significant valvular abnormalities.  He was most recently admitted to the hospital from 12/21/20 through 12/26/20 with abdominal and chest pain and was noted to be in A. fib with RVR.  CT of the chest showed findings consistent with recurrent pancreatitis.  Urine drug screen was again positive for cocaine.  He was placed on a diltiazem infusion for rate control and subsequently converted to sinus rhythm.  Echo showed an EF of 55 to 60%, mild LVH, normal RV systolic function and ventricular cavity size, and trivial mitral regurgitation.  High-sensitivity troponin of 18.  It was felt his episode of recurrent A. fib was in the setting of medication noncompliance, polysubstance use, and pancreatitis.  It was recommended he be continued on diltiazem at discharge and in the setting of his CHA2DS2-VASc of 1 with history of noncompliance anticoagulation was deferred. ? ?Last seen 02/19/21 and was doing well. He was in SR. He had atypical chest pain and MPI was ordered.  ? ?Today,  ? ?Past Medical History:  ?Diagnosis Date  ? A-fib (HCC)   ? Dental caries   ? Hepatitis C infection   ? Iritis   ? PAF (paroxysmal atrial fibrillation) (HCC)   ? a. diagnosed 03/19/18; b. CHADS2VASc 0  ? Polysubstance abuse (HCC)   ? a. cocaine, tobacco, etoh  ?  Scrotal mass   ? Tick borne fever   ? Uveitis   ? ? ?Past Surgical History:  ?Procedure Laterality Date  ? FINGER SURGERY    ? infection, middle left  ? fractured jaw    ? SHOULDER SURGERY    ? right  ? ? ?Current Medications: ?No outpatient medications have been marked as taking for the 01/28/22 encounter (Appointment) with Fransico Michael, Amad Mau H, PA-C.  ?  ? ?Allergies:   Penicillins  ? ?Social History  ? ?Socioeconomic History  ? Marital status: Single  ?  Spouse name: Not on file  ? Number of children: 0  ? Years of education:  Not on file  ? Highest education level: Not on file  ?Occupational History  ?  Employer: UNEMPLOYE  ?Tobacco Use  ? Smoking status: Former  ?  Packs/day: 0.25  ?  Types: Cigarettes  ?  Quit date: 12/2021  ?  Years since quitting: 0.1  ? Smokeless tobacco: Never  ? Tobacco comments:  ?  Per pt  " I only smoke cigarettes when I drink beer"  ?Vaping Use  ? Vaping Use: Never used  ?Substance and Sexual Activity  ? Alcohol use: Not Currently  ?  Comment: 6 pack per week  ? Drug use: Not Currently  ?  Types: IV, Cocaine  ?  Comment: Pt states that he last used "a while back"  ? Sexual activity: Not on file  ?Other Topics Concern  ? Not on file  ?Social History Narrative  ? Lives in [place]; with fiance. 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; cleaning foreclosed houses;   ? ?Social Determinants of Health  ? ?Financial Resource Strain: Not on file  ?Food Insecurity: No Food Insecurity  ? Worried About Programme researcher, broadcasting/film/video in the Last Year: Never true  ? Ran Out of Food in the Last Year: Never true  ?Transportation Needs: No Transportation Needs  ? Lack of Transportation (Medical): No  ? Lack of Transportation (Non-Medical): No  ?Physical Activity: Not on file  ?Stress: Not on file  ?Social Connections: Not on file  ?  ? ?Family History: ?The patient's ***family history includes Drug abuse in his father; Schizophrenia in his mother. ? ?ROS:   ?Please see the history of present illness.    ?*** All other systems reviewed and are negative. ? ?EKGs/Labs/Other Studies Reviewed:   ? ?The following studies were reviewed today: ?*** ? ?EKG:  EKG is *** ordered today.  The ekg ordered today demonstrates *** ? ?Recent Labs: ?10/18/2021: Magnesium 2.1 ?12/09/2021: ALT 32; BUN 8; Creatinine, Ser 0.88; Hemoglobin 15.2; Platelets 236; Potassium 3.7; Sodium 140  ?Recent Lipid Panel ?   ?Component Value Date/Time  ? CHOL 132 12/09/2021 1856  ? TRIG 127 12/09/2021 1856  ? HDL 52 12/09/2021 1856  ? CHOLHDL 2.5 12/09/2021 1856  ? CHOLHDL 1.9  12/22/2020 0506  ? VLDL 11 12/22/2020 0506  ? LDLCALC 58 12/09/2021 1856  ? ? ? ?Risk Assessment/Calculations:   ?{Does this patient have ATRIAL FIBRILLATION?:[id number]} ? ? ?Physical Exam:   ? ?VS:  There were no vitals taken for this visit.   ? ?Wt Readings from Last 3 Encounters:  ?01/12/22 164 lb 11.2 oz (74.7 kg)  ?01/11/22 168 lb 14.4 oz (76.6 kg)  ?12/09/21 170 lb 8 oz (77.3 kg)  ?  ? ?GEN: *** Well nourished, well developed in no acute distress ?HEENT: Normal ?NECK: No JVD; No carotid bruits ?LYMPHATICS: No lymphadenopathy ?CARDIAC: ***RRR, no  murmurs, rubs, gallops ?RESPIRATORY:  Clear to auscultation without rales, wheezing or rhonchi  ?ABDOMEN: Soft, non-tender, non-distended ?MUSCULOSKELETAL:  No edema; No deformity  ?SKIN: Warm and dry ?NEUROLOGIC:  Alert and oriented x 3 ?PSYCHIATRIC:  Normal affect  ? ?ASSESSMENT:   ? ?No diagnosis found. ?PLAN:   ? ?In order of problems listed above: ? ?PAF ? ?Chest pain ? ?HTN ? ?Polysubstance abuse ? ?Medications noncompliance ? ?Disposition: Follow up {follow up:15908} with ***  ? ?Shared Decision Making/Informed Consent   ?{Are you ordering a CV Procedure (e.g. stress test, cath, DCCV, TEE, etc)?   Press F2        :[id number]}  ? ? ?Signed, ?Declin Rajan David Stall, PA-C  ?01/27/2022 4:39 PM    ?[facility]  ?

## 2022-01-28 ENCOUNTER — Ambulatory Visit: Payer: Self-pay | Admitting: Medical

## 2022-01-31 ENCOUNTER — Encounter: Payer: Self-pay | Admitting: Medical

## 2022-02-14 ENCOUNTER — Encounter: Payer: Self-pay | Admitting: Cardiology

## 2022-02-18 ENCOUNTER — Other Ambulatory Visit: Payer: Self-pay

## 2022-02-21 ENCOUNTER — Other Ambulatory Visit: Payer: Self-pay

## 2022-03-03 ENCOUNTER — Ambulatory Visit: Payer: Self-pay | Admitting: Gerontology

## 2022-03-10 ENCOUNTER — Ambulatory Visit: Payer: Self-pay | Admitting: Gerontology

## 2022-03-10 ENCOUNTER — Other Ambulatory Visit: Payer: Self-pay

## 2022-03-28 ENCOUNTER — Other Ambulatory Visit: Payer: Self-pay | Admitting: Gerontology

## 2022-03-28 ENCOUNTER — Other Ambulatory Visit: Payer: Self-pay

## 2022-03-28 DIAGNOSIS — I10 Essential (primary) hypertension: Secondary | ICD-10-CM

## 2022-03-28 DIAGNOSIS — Z87438 Personal history of other diseases of male genital organs: Secondary | ICD-10-CM

## 2022-03-28 DIAGNOSIS — R519 Headache, unspecified: Secondary | ICD-10-CM

## 2022-03-28 DIAGNOSIS — I480 Paroxysmal atrial fibrillation: Secondary | ICD-10-CM

## 2022-03-29 ENCOUNTER — Other Ambulatory Visit: Payer: Self-pay

## 2022-03-29 MED ORDER — IBUPROFEN 400 MG PO TABS
400.0000 mg | ORAL_TABLET | Freq: Two times a day (BID) | ORAL | 0 refills | Status: DC
  Filled 2022-03-29: qty 30, 15d supply, fill #0

## 2022-03-29 MED ORDER — TAMSULOSIN HCL 0.4 MG PO CAPS
0.4000 mg | ORAL_CAPSULE | Freq: Every day | ORAL | 0 refills | Status: DC
  Filled 2022-03-29: qty 30, 30d supply, fill #0

## 2022-03-29 MED ORDER — DILTIAZEM HCL ER COATED BEADS 120 MG PO CP24
120.0000 mg | ORAL_CAPSULE | Freq: Every day | ORAL | 0 refills | Status: DC
  Filled 2022-03-29: qty 30, 30d supply, fill #0

## 2022-04-07 ENCOUNTER — Ambulatory Visit: Payer: Self-pay | Admitting: Medical

## 2022-04-07 NOTE — Progress Notes (Deleted)
Cardiology Office Note:    Date:  04/07/2022   ID:  Bruce [name], DOB 09/06/1971, MRN [id number]  PCP:  Rolm Gala, NP  [facility] Cardiologist:  Julien Nordmann, MD  [facility] Electrophysiologist:  None   Referring MD: No ref. provider found   Chief Complaint: 6 month follow-up  History of Present Illness:    Bruce [name] is a 51 y.o. male with a hx of PAF, polysubstance abuse with ongoing cocaine, alcohol and tobacco abuse, HTN, hepatitis C in the setting of IV drug use, and medications noncompliance who presents for follow-up.    First seen in 03/2018 during hospitalization for afib in the context of binge drinking, cocaine and tobacco use. Echo at that time showed EF 60-65%, no WMA, G2DD, mild MR, normal size left atrium, normal RV systolic function and ventricular cavity size, PASP normal. He converted to SR with rate control. He was not started on [facility] given CHADSVAC of 1. Seen in the ED 09/2018 with recurrent afib wit RVR in the setting of cocaine use. He was started on flecainide and continued cardizem. He converted to SR in the ED. Subsequent nuclear stress test in 10/2018 showed no evidence of ischemia and was overall low risk. Outpatient cardiac monitoring 10/2018 showed NSR, pSVT longest episode lasting 14 beats.    Echo 06/2019 showed EF 55-60%, normal LV diastolic function parameters, normal RV systolic function and ventricular cavity size, normal PASP, and no significant valvular abnormalities.    Admitted 2/14-2/19/22 with abdominal pain and chest pain and noted to be in afib RVR. CT chest showed findings consistent with recurrent pancreatitis. UDS positive for cocaine. He was placed on dilt infusion and subsequently converted to SR. Echo showed EF 55-60%, mlid LVH, normal RV function, trivial MR. Afib felt to be in the setting of medication noncompliance, polysubstance use, and pancreatitis.    Seen 02/09/21 and was doing well from a cardiac perspective.  He was in SR. CHADSVASC 1 and again [facility] deferred. Lexiscan was ordered for atypical chest pain. Lexiscan was overall reassuring.   Last seen 06/09/21 and was doing well from a cardiac perspective. BP was high, but he ran out of diltiazem a month prior.   Today,    Past Medical History:  Diagnosis Date   A-fib (HCC)    Dental caries    Hepatitis C infection    Iritis    PAF (paroxysmal atrial fibrillation) (HCC)    a. diagnosed 03/19/18; b. CHADS2VASc 0   Polysubstance abuse (HCC)    a. cocaine, tobacco, etoh   Scrotal mass    Tick borne fever    Uveitis     Past Surgical History:  Procedure Laterality Date   FINGER SURGERY     infection, middle left   fractured jaw     SHOULDER SURGERY     right    Current Medications: No outpatient medications have been marked as taking for the 04/07/22 encounter (Appointment) with Fransico Michael, Davonte Siebenaler H, PA-C.     Allergies:   Penicillins   Social History   Socioeconomic History   Marital status: Single    Spouse name: Not on file   Number of children: 0   Years of education: Not on file   Highest education level: Not on file  Occupational History    Employer: UNEMPLOYE  Tobacco Use   Smoking status: Former    Packs/day: 0.25    Types: Cigarettes    Quit date: 12/2021  Years since quitting: 0.3   Smokeless tobacco: Never   Tobacco comments:    Per pt  " I only smoke cigarettes when I drink beer"  Vaping Use   Vaping Use: Never used  Substance and Sexual Activity   Alcohol use: Not Currently    Comment: 6 pack per week   Drug use: Not Currently    Types: IV, Cocaine    Comment: Pt states that he last used "a while back"   Sexual activity: Not on file  Other Topics Concern   Not on file  Social History Narrative   Lives in [place]; with fiance. 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; cleaning foreclosed houses;    Social Determinants of Health   Financial Resource Strain: Not on file  Food Insecurity: No Food Insecurity    Worried About Programme researcher, broadcasting/film/video in the Last Year: Never true   Ran Out of Food in the Last Year: Never true  Transportation Needs: No Transportation Needs   Lack of Transportation (Medical): No   Lack of Transportation (Non-Medical): No  Physical Activity: Not on file  Stress: Not on file  Social Connections: Not on file     Family History: The patient's family history includes Drug abuse in his father; Schizophrenia in his mother.  ROS:   Please see the history of present illness.     All other systems reviewed and are negative.  EKGs/Labs/Other Studies Reviewed:    The following studies were reviewed today:  Echo 12/2020 1. Left ventricular ejection fraction, by estimation, is 55 to 60%. The  left ventricle has normal function. Left ventricular endocardial border  not optimally defined to evaluate regional wall motion. There is mild left  ventricular hypertrophy. Left  ventricular diastolic parameters are indeterminate.   2. Right ventricular systolic function is normal. The right ventricular  size is normal. Tricuspid regurgitation signal is inadequate for assessing  PA pressure.   3. The mitral valve is normal in structure. Trivial mitral valve  regurgitation. No evidence of mitral stenosis.   4. The aortic valve has an indeterminant number of cusps. Aortic valve  regurgitation is not visualized. No aortic stenosis is present.   Myoview stress test 02/2021 Narrative & Impression  T wave inversion was noted during stress. The study is normal. This is a low risk study. The left ventricular ejection fraction is normal (63%). There is no evidence for ischemia.    EKG:  EKG is *** ordered today.  The ekg ordered today demonstrates ***  Recent Labs: 10/18/2021: Magnesium 2.1 12/09/2021: ALT 32; BUN 8; Creatinine, Ser 0.88; Hemoglobin 15.2; Platelets 236; Potassium 3.7; Sodium 140  Recent Lipid Panel    Component Value Date/Time   CHOL 132 12/09/2021 1856   TRIG 127  12/09/2021 1856   HDL 52 12/09/2021 1856   CHOLHDL 2.5 12/09/2021 1856   CHOLHDL 1.9 12/22/2020 0506   VLDL 11 12/22/2020 0506   LDLCALC 58 12/09/2021 1856     Risk Assessment/Calculations:   {Does this patient have ATRIAL FIBRILLATION?:[id number]}   Physical Exam:    VS:  There were no vitals taken for this visit.    Wt Readings from Last 3 Encounters:  01/12/22 164 lb 11.2 oz (74.7 kg)  01/11/22 168 lb 14.4 oz (76.6 kg)  12/09/21 170 lb 8 oz (77.3 kg)     GEN: *** Well nourished, well developed in no acute distress HEENT: Normal NECK: No JVD; No carotid bruits LYMPHATICS: No lymphadenopathy CARDIAC: ***  RRR, no murmurs, rubs, gallops RESPIRATORY:  Clear to auscultation without rales, wheezing or rhonchi  ABDOMEN: Soft, non-tender, non-distended MUSCULOSKELETAL:  No edema; No deformity  SKIN: Warm and dry NEUROLOGIC:  Alert and oriented x 3 PSYCHIATRIC:  Normal affect   ASSESSMENT:    No diagnosis found. PLAN:    In order of problems listed above:  PAF  Atypical chest pain  HTN  Polysubstance abuse  Medication nonadheraance    Disposition: Follow up {follow up:15908} with ***   Shared Decision Making/Informed Consent   {Are you ordering a CV Procedure (e.g. stress test, cath, DCCV, TEE, etc)?   Press F2        :[id number]}    Signed, Trayce Maino Ardelle Lesches  04/07/2022 8:47 AM    [facility]

## 2022-04-08 ENCOUNTER — Encounter: Payer: Self-pay | Admitting: Medical

## 2022-05-05 ENCOUNTER — Other Ambulatory Visit: Payer: Self-pay

## 2022-05-05 ENCOUNTER — Other Ambulatory Visit: Payer: Self-pay | Admitting: Gerontology

## 2022-05-05 DIAGNOSIS — I480 Paroxysmal atrial fibrillation: Secondary | ICD-10-CM

## 2022-05-05 DIAGNOSIS — I10 Essential (primary) hypertension: Secondary | ICD-10-CM

## 2022-05-05 MED ORDER — DILTIAZEM HCL ER COATED BEADS 120 MG PO CP24
120.0000 mg | ORAL_CAPSULE | Freq: Every day | ORAL | 0 refills | Status: DC
  Filled 2022-05-05: qty 30, 30d supply, fill #0

## 2022-05-11 ENCOUNTER — Ambulatory Visit: Payer: Self-pay | Admitting: Gerontology

## 2022-05-12 ENCOUNTER — Ambulatory Visit: Payer: Self-pay | Admitting: Infectious Diseases

## 2022-05-12 NOTE — Progress Notes (Deleted)
Patient Name: Bruce [name]  Date of Birth: 16-Jun-1971  MRN: [id number]  PCP: Rolm Gala, NP  Referring Provider: Rolm Gala, NP, Ph#: [phone]   CC:  Chronic hepatitis C follow up.    HPI/ROS:  Bruce [name] is a 51 y.o. male with chronic hepatitis C GT 1b,   Hospitalization in Dec 2022 for ETOH related pancreatitis.   Mavyret started around end of November/early December 2022. Finished it a few weeks ago. Does not think he   Hep C RNA levels:  06/2021 - 31,800,000 11/2021 - < 15 copies  01/2022 - < 15 copies   Has been urinating a lot throughout the day - feels like he is always in the bathroom. Feels that he has to 'push' out     Review of Systems  Constitutional:  Negative for appetite change, chills, fatigue, fever and unexpected weight change.  Eyes:  Negative for visual disturbance.  Respiratory:  Negative for cough and shortness of breath.   Cardiovascular:  Negative for chest pain and leg swelling.  Gastrointestinal:  Negative for abdominal pain, diarrhea and nausea.  Genitourinary:  Positive for decreased urine volume and frequency. Negative for dysuria, genital sores, penile discharge and urgency.  Musculoskeletal:  Negative for joint swelling.  Skin:  Negative for color change and rash.  Neurological:  Negative for dizziness and headaches.  Hematological:  Negative for adenopathy.  Psychiatric/Behavioral:  Negative for sleep disturbance. The patient is not nervous/anxious.     All other systems reviewed and are negative      Past Medical History:  Diagnosis Date   A-fib (HCC)    Dental caries    Hepatitis C infection    Iritis    PAF (paroxysmal atrial fibrillation) (HCC)    a. diagnosed 03/19/18; b. CHADS2VASc 0   Polysubstance abuse (HCC)    a. cocaine, tobacco, etoh   Scrotal mass    Tick borne fever    Uveitis     Prior to Admission medications   Medication Sig Start Date End Date Taking? Authorizing Provider   Cyanocobalamin (B-12 PO) Take by mouth as needed.    [provider]  diltiazem (CARDIZEM CD) 120 MG 24 hr capsule Take 1 capsule (120 mg total) by mouth daily. 06/09/21 07/09/21  Furth, Cadence H, PA-C    Allergies  Allergen Reactions   Penicillins Other (See Comments)    Did it involve swelling of the face/tongue/throat, SOB, or low BP? No Did it involve sudden or severe rash/hives, skin peeling, or any reaction on the inside of your mouth or nose? No Did you need to seek medical attention at a hospital or doctor's office? No When did it last happen?       If all above answers are "NO", may proceed with cephalosporin use.    Social History   Tobacco Use   Smoking status: Former    Packs/day: 0.25    Types: Cigarettes    Quit date: 12/2021    Years since quitting: 0.4   Smokeless tobacco: Never   Tobacco comments:    Per pt  " I only smoke cigarettes when I drink beer"  Vaping Use   Vaping Use: Never used  Substance Use Topics   Alcohol use: Not Currently    Comment: 6 pack per week   Drug use: Not Currently    Types: IV, Cocaine    Comment: Pt states that he last used "a while back"  Family History  Problem Relation Age of Onset   Schizophrenia Mother    Drug abuse Father        throat cancer     Objective:   There were no vitals filed for this visit.  Constitutional: in no apparent distress, well developed and well nourished, and oriented times 3 Eyes: anicteric Cardiovascular: Cor RRR Respiratory: clear Gastrointestinal: Bowel sounds are normal, liver is not enlarged, spleen is not enlarged Musculoskeletal: peripheral pulses normal, no pedal edema, no clubbing or cyanosis Skin: negative for - jaundice, spider hemangioma, telangiectasia, palmar erythema, ecchymosis and atrophy; no porphyria cutanea tarda Lymphatic: no cervical lymphadenopathy   Laboratory: Genotype:  Lab Results  Component Value Date   HCVGENOTYPE 1b 06/29/2021   HCV viral  load:  Lab Results  Component Value Date   HCVQUANT 700,490 (H) 05/22/2012   Lab Results  Component Value Date   WBC 5.2 12/09/2021   HGB 15.2 12/09/2021   HCT 44.3 12/09/2021   MCV 92 12/09/2021   PLT 236 12/09/2021    Lab Results  Component Value Date   CREATININE 0.88 12/09/2021   BUN 8 12/09/2021   NA 140 12/09/2021   K 3.7 12/09/2021   CL 101 12/09/2021   CO2 23 12/09/2021    Lab Results  Component Value Date   ALT 32 12/09/2021   AST 41 (H) 12/09/2021   GGT 126 (H) 06/29/2021   ALKPHOS 115 12/09/2021    Lab Results  Component Value Date   INR 1.0 06/29/2021   BILITOT 0.4 12/09/2021   ALBUMIN 4.2 12/09/2021    Imaging:  CT scan in February 2022 as outlined above. +Hepatic steatosis   Assessment & Plan:   Problem List Items Addressed This Visit   None   Rexene Alberts, MSN, NP-C [facility]  [email or web address] Pager: [phone] Office: [phone] [facility] Main Line: [phone]

## 2022-05-13 ENCOUNTER — Ambulatory Visit: Payer: Self-pay | Admitting: Infectious Diseases

## 2022-05-16 ENCOUNTER — Ambulatory Visit (INDEPENDENT_AMBULATORY_CARE_PROVIDER_SITE_OTHER): Payer: Self-pay | Admitting: Infectious Diseases

## 2022-05-16 ENCOUNTER — Encounter: Payer: Self-pay | Admitting: Infectious Diseases

## 2022-05-16 ENCOUNTER — Other Ambulatory Visit: Payer: Self-pay

## 2022-05-16 VITALS — BP 122/80 | HR 66 | Temp 97.8°F | Wt 176.6 lb

## 2022-05-16 DIAGNOSIS — B182 Chronic viral hepatitis C: Secondary | ICD-10-CM

## 2022-05-16 NOTE — Assessment & Plan Note (Signed)
Will check SVR HCV RNA today and repeat Fibrotest to reassess - F4 likely influenced from pancreatitis at our first meeting. Imaging with only hepatic steatosis and normal elastography.   He will return in 10 days to review labs and proceed with next steps if any for [facility] screening and liver health.   Hep B vaccine #2 was offered today but he will get it next week at return and lab review.

## 2022-05-16 NOTE — Progress Notes (Signed)
Patient Name: Bruce [name]  Date of Birth: 22-May-1971  MRN: [id number]  PCP: Rolm Gala, NP  Referring Provider: Rolm Gala, NP, Ph#: [phone]   CC:  Chronic hepatitis C follow up.    HPI/ROS:  Bruce [name] is a 51 y.o. male with chronic hepatitis C GT 1b, F4 on Fibrotest but Elastography was normal.   Mavyret started around end of November/early December 2022. Finished around March 2023.   Hep C RNA levels:  06/2021 - 31,800,000 11/2021 - < 15 copies  01/11/22 - < 15 copies   Doing well since LOV in March of this year. No changes to medical history. No hospitalizations in this time and has not had any trouble with recurrent pancreatitis. ETOH intake is better.    Review of Systems  Constitutional:  Negative for chills and fever.  HENT:  Negative for sore throat.   Respiratory:  Negative for cough and shortness of breath.   Cardiovascular: Negative.   Gastrointestinal:  Negative for abdominal pain, diarrhea and vomiting.  Musculoskeletal:  Negative for myalgias and neck pain.  Skin:  Negative for rash.  Neurological:  Negative for headaches.  Psychiatric/Behavioral:  The patient is not nervous/anxious.     All other systems reviewed and are negative      Past Medical History:  Diagnosis Date   A-fib (HCC)    Dental caries    Hepatitis C infection    Iritis    PAF (paroxysmal atrial fibrillation) (HCC)    a. diagnosed 03/19/18; b. CHADS2VASc 0   Polysubstance abuse (HCC)    a. cocaine, tobacco, etoh   Scrotal mass    Tick borne fever    Uveitis     Prior to Admission medications   Medication Sig Start Date End Date Taking? Authorizing Provider  Cyanocobalamin (B-12 PO) Take by mouth as needed.    [provider]  diltiazem (CARDIZEM CD) 120 MG 24 hr capsule Take 1 capsule (120 mg total) by mouth daily. 06/09/21 07/09/21  Furth, Cadence H, PA-C    Allergies  Allergen Reactions   Penicillins Other (See Comments)    Did it  involve swelling of the face/tongue/throat, SOB, or low BP? No Did it involve sudden or severe rash/hives, skin peeling, or any reaction on the inside of your mouth or nose? No Did you need to seek medical attention at a hospital or doctor's office? No When did it last happen?       If all above answers are "NO", may proceed with cephalosporin use.    Social History   Tobacco Use   Smoking status: Former    Packs/day: 0.25    Types: Cigarettes    Quit date: 12/2021    Years since quitting: 0.4   Smokeless tobacco: Never   Tobacco comments:    Per pt  " I only smoke cigarettes when I drink beer"  Vaping Use   Vaping Use: Never used  Substance Use Topics   Alcohol use: Not Currently    Comment: 6 pack per week   Drug use: Not Currently    Types: IV, Cocaine    Comment: Pt states that he last used "a while back"    Family History  Problem Relation Age of Onset   Schizophrenia Mother    Drug abuse Father        throat cancer     Objective:   Vitals:   05/16/22 1127  BP: 122/80  Pulse:  66  Temp: 97.8 F (36.6 C)  SpO2: 99%    Constitutional: in no apparent distress, well developed and well nourished, alert and oriented Eyes: anicteric Cardiovascular: Cor RRR Respiratory: clear Gastrointestinal: Bowel sounds are normal, liver is not enlarged, spleen is not enlarged Musculoskeletal: normal gait  Skin: negative for rashes Lymphatic: no cervical lymphadenopathy   Laboratory: Genotype:  Lab Results  Component Value Date   HCVGENOTYPE 1b 06/29/2021   HCV viral load:  Lab Results  Component Value Date   HCVQUANT 700,490 (H) 05/22/2012   Lab Results  Component Value Date   WBC 5.2 12/09/2021   HGB 15.2 12/09/2021   HCT 44.3 12/09/2021   MCV 92 12/09/2021   PLT 236 12/09/2021    Lab Results  Component Value Date   CREATININE 0.88 12/09/2021   BUN 8 12/09/2021   NA 140 12/09/2021   K 3.7 12/09/2021   CL 101 12/09/2021   CO2 23 12/09/2021    Lab  Results  Component Value Date   ALT 32 12/09/2021   AST 41 (H) 12/09/2021   GGT 126 (H) 06/29/2021   ALKPHOS 115 12/09/2021    Lab Results  Component Value Date   INR 1.0 06/29/2021   BILITOT 0.4 12/09/2021   ALBUMIN 4.2 12/09/2021    Imaging:  CT scan in February 2022 as outlined above. +Hepatic steatosis   Assessment & Plan:   Problem List Items Addressed This Visit       Unprioritized   Hepatitis C infection - Primary    Will check SVR HCV RNA today and repeat Fibrotest to reassess - F4 likely influenced from pancreatitis at our first meeting. Imaging with only hepatic steatosis and normal elastography.   He will return in 10 days to review labs and proceed with next steps if any for [facility] screening and liver health.   Hep B vaccine #2 was offered today but he will get it next week at return and lab review.       Relevant Orders   Hepatitis C RNA quantitative ([facility])   Liver Fibrosis, FibroTest-ActiTest    Rexene Alberts, MSN, NP-C [facility]  [email or web address] Pager: [phone] Office: [phone] [facility] Main Line: [phone]

## 2022-05-16 NOTE — Patient Instructions (Addendum)
Schedule a follow up visit with me in 10 days so we can go over your results.   Will give you your final vaccine

## 2022-05-18 ENCOUNTER — Other Ambulatory Visit (HOSPITAL_COMMUNITY): Payer: Self-pay

## 2022-05-18 ENCOUNTER — Other Ambulatory Visit: Payer: Self-pay

## 2022-05-18 ENCOUNTER — Ambulatory Visit: Payer: Self-pay | Admitting: Adult Health

## 2022-05-18 ENCOUNTER — Encounter: Payer: Self-pay | Admitting: Gerontology

## 2022-05-18 VITALS — BP 130/86 | HR 68 | Temp 97.8°F | Resp 16 | Ht 67.0 in | Wt 176.3 lb

## 2022-05-18 DIAGNOSIS — N401 Enlarged prostate with lower urinary tract symptoms: Secondary | ICD-10-CM

## 2022-05-18 DIAGNOSIS — Z87438 Personal history of other diseases of male genital organs: Secondary | ICD-10-CM

## 2022-05-18 DIAGNOSIS — M25511 Pain in right shoulder: Secondary | ICD-10-CM

## 2022-05-18 DIAGNOSIS — I480 Paroxysmal atrial fibrillation: Secondary | ICD-10-CM

## 2022-05-18 DIAGNOSIS — R7303 Prediabetes: Secondary | ICD-10-CM

## 2022-05-18 DIAGNOSIS — Z0000 Encounter for general adult medical examination without abnormal findings: Secondary | ICD-10-CM

## 2022-05-18 DIAGNOSIS — I10 Essential (primary) hypertension: Secondary | ICD-10-CM

## 2022-05-18 DIAGNOSIS — R519 Headache, unspecified: Secondary | ICD-10-CM

## 2022-05-18 LAB — POCT GLYCOSYLATED HEMOGLOBIN (HGB A1C): Hemoglobin A1C: 6.6 % — AB (ref 4.0–5.6)

## 2022-05-18 LAB — GLUCOSE, POCT (MANUAL RESULT ENTRY): POC Glucose: 105 mg/dl — AB (ref 70–99)

## 2022-05-18 MED ORDER — DILTIAZEM HCL ER COATED BEADS 120 MG PO CP24
120.0000 mg | ORAL_CAPSULE | Freq: Every day | ORAL | 0 refills | Status: DC
  Filled 2022-05-18: qty 30, 30d supply, fill #0

## 2022-05-18 MED ORDER — DILTIAZEM HCL ER COATED BEADS 120 MG PO CP24
120.0000 mg | ORAL_CAPSULE | Freq: Every day | ORAL | 0 refills | Status: DC
  Filled 2022-05-18: qty 90, 90d supply, fill #0

## 2022-05-18 MED ORDER — PREDNISONE 10 MG PO TABS
ORAL_TABLET | ORAL | 0 refills | Status: AC
  Filled 2022-05-18: qty 43, 14d supply, fill #0

## 2022-05-18 MED ORDER — TAMSULOSIN HCL 0.4 MG PO CAPS
0.4000 mg | ORAL_CAPSULE | Freq: Every day | ORAL | 0 refills | Status: DC
  Filled 2022-05-18: qty 90, 90d supply, fill #0

## 2022-05-18 MED ORDER — TAMSULOSIN HCL 0.4 MG PO CAPS
0.4000 mg | ORAL_CAPSULE | Freq: Every day | ORAL | 0 refills | Status: DC
  Filled 2022-05-18: qty 30, 30d supply, fill #0

## 2022-05-18 NOTE — Patient Instructions (Signed)
Colonoscopy, Adult A colonoscopy is a procedure to look at the entire large intestine. This procedure is done using a long, thin, flexible tube that has a camera on the end. You may have a colonoscopy: As a part of normal colorectal screening. If you have certain symptoms, such as: A low number of red blood cells in your blood (anemia). Diarrhea that does not go away. Pain in your abdomen. Blood in your stool. A colonoscopy can help screen for and diagnose medical problems, including: An abnormal growth of cells or tissue (tumor). Abnormal growths within the lining of your intestine (polyps). Inflammation. Areas of bleeding. Tell your health care provider about: Any allergies you have. All medicines you are taking, including vitamins, herbs, eye drops, creams, and over-the-counter medicines. Any problems you or family members have had with anesthetic medicines. Any bleeding problems you have. Any surgeries you have had. Any medical conditions you have. Any problems you have had with having bowel movements. Whether you are pregnant or may be pregnant. What are the risks? Generally, this is a safe procedure. However, problems may occur, including: Bleeding. Damage to your intestine. Allergic reactions to medicines given during the procedure. Infection. This is rare. What happens before the procedure? Eating and drinking restrictions Follow instructions from your health care provider about eating or drinking restrictions, which may include: A few days before the procedure: Follow a low-fiber diet. Avoid nuts, seeds, dried fruit, raw fruits, and vegetables. 1-3 days before the procedure: Eat only gelatin dessert or ice pops. Drink only clear liquids, such as water, clear juice, clear broth or bouillon, black coffee or tea, or clear soft drinks or sports drinks. Avoid liquids that contain red or purple dye. The day of the procedure: Do not eat solid foods. You may continue to drink  clear liquids until up to 2 hours before the procedure. Do not eat or drink anything starting 2 hours before the procedure, or within the time period that your health care provider recommends. Bowel prep If you were prescribed a bowel prep to take by mouth (orally) to clean out your colon: Take it as told by your health care provider. Starting the day before your procedure, you will need to drink a large amount of liquid medicine. The liquid will cause you to have many bowel movements of loose stool until your stool becomes almost clear or light green. If your skin or the opening between the buttocks (anus) gets irritated from diarrhea, you may relieve the irritation using: Wipes with medicine in them, such as adult wet wipes with aloe and vitamin E. A product to soothe skin, such as petroleum jelly. If you vomit while drinking the bowel prep: Take a break for up to 60 minutes. Begin the bowel prep again. Call your health care provider if you keep vomiting or you cannot take the bowel prep without vomiting. To clean out your colon, you may also be given: Laxative medicines. These help you have a bowel movement. Instructions for enema use. An enema is liquid medicine injected into your rectum. Medicines Ask your health care provider about: Changing or stopping your regular medicines or supplements. This is especially important if you are taking iron supplements, diabetes medicines, or blood thinners. Taking medicines such as aspirin and ibuprofen. These medicines can thin your blood. Do not take these medicines unless your health care provider tells you to take them. Taking over-the-counter medicines, vitamins, herbs, and supplements. General instructions Ask your health care provider what steps will be   taken to help prevent infection. These may include washing skin with a germ-killing soap. If you will be going home right after the procedure, plan to have a responsible adult: Take you home  from the hospital or clinic. You will not be allowed to drive. Care for you for the time you are told. What happens during the procedure?  An IV will be inserted into one of your veins. You will be given a medicine to make you fall asleep (general anesthetic). You will lie on your side with your knees bent. A lubricant will be put on the tube. Then the tube will be: Inserted into your anus. Gently eased through all parts of your large intestine. Air will be sent into your colon to keep it open. This may cause some pressure or cramping. Images will be taken with the camera and will appear on a screen. A small tissue sample may be removed to be looked at under a microscope (biopsy). The tissue may be sent to a lab for testing if any signs of problems are found. If small polyps are found, they may be removed and checked for cancer cells. When the procedure is finished, the tube will be removed. The procedure may vary among health care providers and hospitals. What happens after the procedure? Your blood pressure, heart rate, breathing rate, and blood oxygen level will be monitored until you leave the hospital or clinic. You may have a small amount of blood in your stool. You may pass gas and have mild cramping or bloating in your abdomen. This is caused by the air that was used to open your colon during the exam. If you were given a sedative during the procedure, it can affect you for several hours. Do not drive or operate machinery until your health care provider says that it is safe. It is up to you to get the results of your procedure. Ask your health care provider, or the department that is doing the procedure, when your results will be ready. Summary A colonoscopy is a procedure to look at the entire large intestine. Follow instructions from your health care provider about eating and drinking before the procedure. If you were prescribed an oral bowel prep to clean out your colon, take it  as told by your health care provider. During the colonoscopy, a flexible tube with a camera on its end is inserted into the anus and then passed into all parts of the large intestine. This information is not intended to replace advice given to you by your health care provider. Make sure you discuss any questions you have with your health care provider. Document Revised: 10/18/2021 Document Reviewed: 06/16/2021 Elsevier Patient Education  2023 [employer].  MyPlate from [employer] is an outline of a general healthy diet based on the Dietary Guidelines for Americans, 2020-2025, from the U.S. Department of Agriculture Architect). It sets guidelines for how much food you should eat from each food group based on your age, sex, and level of physical activity. What are tips for following MyPlate? To follow MyPlate recommendations: Eat a wide variety of fruits and vegetables, grains, and protein foods. Serve smaller portions and eat less food throughout the day. Limit portion sizes to avoid overeating. Enjoy your food. Get at least 150 minutes of exercise every week. This is about 30 minutes each day, 5 or more days per week. It can be difficult to have every meal look like MyPlate. Think about MyPlate as eating guidelines for an  entire day, rather than each individual meal. Fruits and vegetables Make one half of your plate fruits and vegetables. Eat many different colors of fruits and vegetables each day. For a 2,000-calorie daily food plan, eat: 2 cups of vegetables every day. 2 cups of fruit every day. 1 cup is equal to: 1 cup raw or cooked vegetables. 1 cup raw fruit. 1 medium-sized orange, apple, or banana. 1 cup 100% fruit or vegetable juice. 2 cups raw leafy greens, such as lettuce, spinach, or kale.  cup dried fruit. Grains One fourth of your plate should be grains. Make at least half of the grains you eat each day whole grains. For a 2,000-calorie daily food plan, eat 6 oz of  grains every day. 1 oz is equal to: 1 slice bread. 1 cup cereal.  cup cooked rice, cereal, or pasta. Protein One fourth of your plate should be protein. Eat a wide variety of protein foods, including meat, poultry, fish, eggs, beans, nuts, and tofu. For a 2,000-calorie daily food plan, eat 5 oz of protein every day. 1 oz is equal to: 1 oz meat, poultry, or fish.  cup cooked beans. 1 egg.  oz nuts or seeds. 1 Tbsp peanut butter. Dairy Drink fat-free or low-fat (1%) milk. Eat or drink dairy as a side to meals. For a 2,000-calorie daily food plan, eat or drink 3 cups of dairy every day. 1 cup is equal to: 1 cup milk, yogurt, cottage cheese, or soy milk (soy beverage). 2 oz processed cheese. 1 oz natural cheese. Fats, oils, salt, and sugars Only small amounts of oils are recommended. Avoid foods that are high in calories and low in nutritional value (empty calories), like foods high in fat or added sugars. Choose foods that are low in salt (sodium). Choose foods that have less than 140 milligrams (mg) of sodium per serving. Drink water instead of sugary drinks. Drink enough fluid to keep your urine pale yellow. Where to find support Work with your health care provider or a dietitian to develop a customized eating plan that is right for you. Download an app (mobile application) to help you track your daily food intake. Where to find more information USDA: https://www.[employer].org/ Summary MyPlate is a general guideline for healthy eating from the USDA. It is based on the Dietary Guidelines for Americans, 2020-2025. In general, fruits and vegetables should take up one half of your plate, grains should take up one fourth of your plate, and protein should take up one fourth of your plate. This information is not intended to replace advice given to you by your health care provider. Make sure you discuss any questions you have with your health care provider. Document Revised: 09/14/2020  Document Reviewed: 09/14/2020 Elsevier Patient Education  2023 [employer].

## 2022-05-18 NOTE — Progress Notes (Signed)
Will he be on this medication for 12 weeks ?
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
## 2022-05-18 NOTE — Progress Notes (Signed)
Oh no! Yes, sounds like a plan. Will have Lupita Leash start working on [employer] assistance - thanks!

## 2022-05-19 ENCOUNTER — Other Ambulatory Visit: Payer: Self-pay

## 2022-05-19 ENCOUNTER — Telehealth: Payer: Self-pay

## 2022-05-19 DIAGNOSIS — Z1211 Encounter for screening for malignant neoplasm of colon: Secondary | ICD-10-CM

## 2022-05-19 LAB — COMPREHENSIVE METABOLIC PANEL
ALT: 109 IU/L — ABNORMAL HIGH (ref 0–44)
AST: 88 IU/L — ABNORMAL HIGH (ref 0–40)
Albumin/Globulin Ratio: 1.3 (ref 1.2–2.2)
Albumin: 4.4 g/dL (ref 3.8–4.9)
Alkaline Phosphatase: 122 IU/L — ABNORMAL HIGH (ref 44–121)
BUN/Creatinine Ratio: 12 (ref 9–20)
BUN: 10 mg/dL (ref 6–24)
Bilirubin Total: 0.4 mg/dL (ref 0.0–1.2)
CO2: 22 mmol/L (ref 20–29)
Calcium: 9.7 mg/dL (ref 8.7–10.2)
Chloride: 101 mmol/L (ref 96–106)
Creatinine, Ser: 0.83 mg/dL (ref 0.76–1.27)
Globulin, Total: 3.4 g/dL (ref 1.5–4.5)
Glucose: 110 mg/dL — ABNORMAL HIGH (ref 70–99)
Potassium: 4 mmol/L (ref 3.5–5.2)
Sodium: 139 mmol/L (ref 134–144)
Total Protein: 7.8 g/dL (ref 6.0–8.5)
eGFR: 105 mL/min/{1.73_m2} (ref >59)

## 2022-05-19 LAB — HEMOGLOBIN A1C
Est. average glucose Bld gHb Est-mCnc: 131 mg/dL
Hgb A1c MFr Bld: 6.2 % — ABNORMAL HIGH (ref 4.8–5.6)

## 2022-05-19 LAB — LIVER FIBROSIS, FIBROTEST-ACTITEST
ALT: 116 U/L — ABNORMAL HIGH (ref 9–46)
Alpha-2-Macroglobulin: 295 mg/dL — ABNORMAL HIGH (ref 106–279)
Apolipoprotein A1: 150 mg/dL (ref 94–176)
Bilirubin: 0.5 mg/dL (ref 0.2–1.2)
Fibrosis Score: 0.87
GGT: 262 U/L — ABNORMAL HIGH (ref 3–95)
Haptoglobin: 12 mg/dL — ABNORMAL LOW (ref 43–212)
Necroinflammat ACT Score: 0.8
Reference ID: 4451967

## 2022-05-19 LAB — LIPID PANEL
Chol/HDL Ratio: 2.7 ratio (ref 0.0–5.0)
Cholesterol, Total: 145 mg/dL (ref 100–199)
HDL: 53 mg/dL (ref >39)
LDL Chol Calc (NIH): 72 mg/dL (ref 0–99)
Triglycerides: 113 mg/dL (ref 0–149)
VLDL Cholesterol Cal: 20 mg/dL (ref 5–40)

## 2022-05-19 LAB — HEPATITIS C RNA QUANTITATIVE
HCV Quantitative Log: 6.76 log IU/mL — ABNORMAL HIGH
HCV RNA, PCR, QN: 5710000 IU/mL — ABNORMAL HIGH

## 2022-05-19 LAB — PSA: Prostate Specific Ag, Serum: 1.3 ng/mL (ref 0.0–4.0)

## 2022-05-19 MED ORDER — NA SULFATE-K SULFATE-MG SULF 17.5-3.13-1.6 GM/177ML PO SOLN
1.0000 | Freq: Once | ORAL | 0 refills | Status: DC
  Filled 2022-05-19: qty 354, fill #0
  Filled 2022-07-25: qty 354, 1d supply, fill #0

## 2022-05-19 NOTE — Telephone Encounter (Signed)
[facility] Patient Advocate Encounter  Completed and sent Support Path application for [employer] for this patient who is uninsured.    Patient assistance phone number for follow up is [phone].   This encounter will be updated until final determination.   Berk Pilot, CPhT [facility] Patient Advocate [facility] Phone: [phone] Fax:  [phone]  

## 2022-05-19 NOTE — Progress Notes (Signed)
Gastroenterology Pre-Procedure Review  Request Date: 06/15/2022 Requesting Physician: Dr. Allegra Lai  PATIENT REVIEW QUESTIONS: The patient responded to the following health history questions as indicated:    1. Are you having any GI issues? no 2. Do you have a personal history of Polyps? no 3. Do you have a family history of Colon Cancer or Polyps? no 4. Diabetes Mellitus? no 5. Joint replacements in the past 12 months?no 6. Major health problems in the past 3 months?no 7. Any artificial heart valves, MVP, or defibrillator?no    MEDICATIONS & ALLERGIES:    Patient reports the following regarding taking any anticoagulation/antiplatelet therapy:   Plavix, Coumadin, Eliquis, Xarelto, Lovenox, Pradaxa, Brilinta, or Effient? no Aspirin? no  Patient confirms/reports the following medications:  Current Outpatient Medications  Medication Sig Dispense Refill   Acetaminophen (TYLENOL PO) Take by mouth daily as needed.     diltiazem (CARDIZEM CD) 120 MG 24 hr capsule Take 1 capsule (120 mg total) by mouth once daily. 90 capsule 0   ondansetron (ZOFRAN) 4 MG tablet Take 1 tablet (4 mg total) by mouth every 8 (eight) hours as needed for nausea or vomiting. 10 tablet 0   predniSONE (DELTASONE) 10 MG tablet Take 6 tablets (60 mg total) by mouth daily with breakfast for 2 days, THEN 5 tablets (50 mg total) daily with breakfast for 2 days, THEN 4 tablets (40 mg total) daily with breakfast for 2 days, THEN 3 tablets (30 mg total) daily with breakfast for 2 days, THEN 2 tablets (20 mg total) daily with breakfast for 2 days, THEN 1 tablet (10 mg total) daily with breakfast for 2 days, THEN 0.5 tablets (5 mg total) daily with breakfast for 2 days. 43 tablet 0   tamsulosin (FLOMAX) 0.4 MG CAPS capsule Take 1 capsule (0.4 mg total) by mouth once daily after breakfast. 90 capsule 0   No current facility-administered medications for this visit.    Patient confirms/reports the following allergies:  Allergies   Allergen Reactions   Penicillins Other (See Comments)    Did it involve swelling of the face/tongue/throat, SOB, or low BP? No Did it involve sudden or severe rash/hives, skin peeling, or any reaction on the inside of your mouth or nose? No Did you need to seek medical attention at a hospital or doctor's office? No When did it last happen?       If all above answers are "NO", may proceed with cephalosporin use.    No orders of the defined types were placed in this encounter.   AUTHORIZATION INFORMATION Primary Insurance: 1D#: Group #:  Secondary Insurance: 1D#: Group #:  SCHEDULE INFORMATION: Date: 06/15/2022 Time: Location:[facility]

## 2022-05-23 ENCOUNTER — Telehealth: Payer: Self-pay

## 2022-05-23 NOTE — Telephone Encounter (Signed)
[facility] Patient Advocate Encounter  Completed and sent Support Path application for Vosevi for this patient who is uninsured.    Patient is approved 05/23/22 through 08/15/22.  Prescription was faxed to [facility], and medication will be delivered to the clinic.   Clearance Coots, CPhT Specialty Pharmacy Patient [facility] Phone: [phone] Fax:  [phone]

## 2022-05-25 ENCOUNTER — Encounter: Payer: Self-pay | Admitting: *Deleted

## 2022-05-27 ENCOUNTER — Telehealth: Payer: Self-pay

## 2022-05-27 NOTE — Telephone Encounter (Signed)
[facility] Patient Advocate Encounter  Patient's medications have been couriered to [facility] from [facility] and will be picked up 06/02/22.  1st Vosevi bottle  Clearance Coots , CPhT Specialty Pharmacy Patient [facility] Phone: [phone] Fax:  [phone]

## 2022-05-31 ENCOUNTER — Ambulatory Visit: Payer: Self-pay | Admitting: Medical

## 2022-05-31 NOTE — Progress Notes (Deleted)
Cardiology Office Note:    Date:  05/31/2022   ID:  Bruce [name], DOB 05-25-1971, MRN [id number]  PCP:  Rolm Gala, NP  [facility] Cardiologist:  Julien Nordmann, MD  [facility] Electrophysiologist:  None   Referring MD: Rolm Gala, NP   Chief Complaint: 6 month follow-up  History of Present Illness:    Bruce [name] is a 51 y.o. male with a hx of PAF, polysubstance abuse with ongoing cocaine, alcohol and tobacco abuse, HTN, hepatitis C in the setting of IV drug use, and medications noncompliance who presents for follow-up.    First seen in 03/2018 during hospitalization for afib in the context of binge drinking, cocaine and tobacco use. Echo at that time showed EF 60-65%, no WMA, G2DD, mild MR, normal size left atrium, normal RV systolic function and ventricular cavity size, PASP normal. He converted to SR with rate control. He was not started on [facility] given CHADSVAC of 1. Seen in the ED 09/2018 with recurrent afib wit RVR in the setting of cocaine use. He was started on flecainide and continued cardizem. He converted to SR in the ED. Subsequent nuclear stress test in 10/2018 showed no evidence of ischemia and was overall low risk. Outpatient cardiac monitoring 10/2018 showed NSR, pSVT longest episode lasting 14 beats.    Echo 06/2019 showed EF 55-60%, normal LV diastolic function parameters, normal RV systolic function and ventricular cavity size, normal PASP, and no significant valvular abnormalities.    Admitted 2/14-2/19/22 with abdominal pain and chest pain and noted to be in afib RVR. CT chest showed findings consistent with recurrent pancreatitis. UDS positive for cocaine. He was placed on dilt infusion and subsequently converted to SR. Echo showed EF 55-60%, mlid LVH, normal RV function, trivial MR. Afib felt to be in the setting of medication noncompliance, polysubstance use, and pancreatitis.    Seen 02/09/21 and was doing well from a cardiac  perspective. He was in SR. CHADSVASC 1 and again [facility] deferred. Lexiscan was ordered for atypical chest pain.   Last seen 06/09/21 and reported intermittent cocaine use. Still smoking. He was given a referral to the hepatitis clinic.   Today,   Past Medical History:  Diagnosis Date   A-fib (HCC)    Dental caries    Hepatitis C infection    Iritis    PAF (paroxysmal atrial fibrillation) (HCC)    a. diagnosed 03/19/18; b. CHADS2VASc 0   Polysubstance abuse (HCC)    a. cocaine, tobacco, etoh   Scrotal mass    Tick borne fever    Uveitis     Past Surgical History:  Procedure Laterality Date   FINGER SURGERY     infection, middle left   fractured jaw     SHOULDER SURGERY     right    Current Medications: No outpatient medications have been marked as taking for the 05/31/22 encounter (Appointment) with Fransico Michael, Charleene Callegari H, PA-C.     Allergies:   Penicillins   Social History   Socioeconomic History   Marital status: Single    Spouse name: Not on file   Number of children: 0   Years of education: Not on file   Highest education level: Not on file  Occupational History    Employer: UNEMPLOYE  Tobacco Use   Smoking status: Former    Packs/day: 0.25    Types: Cigarettes    Quit date: 12/2021    Years since quitting: 0.4   Smokeless  tobacco: Never   Tobacco comments:    Per pt  " I only smoke cigarettes when I drink beer"  Vaping Use   Vaping Use: Never used  Substance and Sexual Activity   Alcohol use: Not Currently    Comment: 6 pack per week   Drug use: Not Currently    Types: IV, Cocaine    Comment: Pt states that he last used "a while back"   Sexual activity: Not on file  Other Topics Concern   Not on file  Social History Narrative   Lives in [place]; with fiance. 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; cleaning foreclosed houses;    Social Determinants of Health   Financial Resource Strain: Low Risk  (09/13/2018)   Overall Financial Resource Strain (CARDIA)     Difficulty of Paying Living Expenses: Not hard at all  Food Insecurity: No Food Insecurity (01/12/2022)   Hunger Vital Sign    Worried About Running Out of Food in the Last Year: Never true    Ran Out of Food in the Last Year: Never true  Transportation Needs: No Transportation Needs (01/12/2022)   PRAPARE - Administrator, Civil Service (Medical): No    Lack of Transportation (Non-Medical): No  Physical Activity: Sufficiently Active (09/13/2018)   Exercise Vital Sign    Days of Exercise per Week: 7 days    Minutes of Exercise per Session: 30 min  Stress: Stress Concern Present (09/13/2018)   [employer] of Occupational Health - Occupational Stress Questionnaire    Feeling of Stress : To some extent  Social Connections: Moderately Integrated (09/13/2018)   Social Connection and Isolation Panel [NHANES]    Frequency of Communication with Friends and Family: More than three times a week    Frequency of Social Gatherings with Friends and Family: More than three times a week    Attends Religious Services: More than 4 times per year    Active Member of [employer] or Organizations: No    Attends Banker Meetings: Never    Marital Status: Living with partner     Family History: The patient's family history includes Drug abuse in his father; Schizophrenia in his mother.  ROS:   Please see the history of present illness.     All other systems reviewed and are negative.  EKGs/Labs/Other Studies Reviewed:    The following studies were reviewed today:  Myoview stress test 02/2021 Study Result   Narrative & Impression  T wave inversion was noted during stress. The study is normal. This is a low risk study. The left ventricular ejection fraction is normal (63%). There is no evidence for ischemia        Echo 12/22/20  1. Left ventricular ejection fraction, by estimation, is 55 to 60%. The  left ventricle has normal function. Left ventricular endocardial border   not optimally defined to evaluate regional wall motion. There is mild left  ventricular hypertrophy. Left  ventricular diastolic parameters are indeterminate.   2. Right ventricular systolic function is normal. The right ventricular  size is normal. Tricuspid regurgitation signal is inadequate for assessing  PA pressure.   3. The mitral valve is normal in structure. Trivial mitral valve  regurgitation. No evidence of mitral stenosis.   4. The aortic valve has an indeterminant number of cusps. Aortic valve  regurgitation is not visualized. No aortic stenosis is present.    EKG:  EKG is *** ordered today.  The ekg ordered today  demonstrates ***  Recent Labs: 10/18/2021: Magnesium 2.1 12/09/2021: Hemoglobin 15.2; Platelets 236 05/18/2022: ALT 109; BUN 10; Creatinine, Ser 0.83; Potassium 4.0; Sodium 139  Recent Lipid Panel    Component Value Date/Time   CHOL 145 05/18/2022 1256   TRIG 113 05/18/2022 1256   HDL 53 05/18/2022 1256   CHOLHDL 2.7 05/18/2022 1256   CHOLHDL 1.9 12/22/2020 0506   VLDL 11 12/22/2020 0506   LDLCALC 72 05/18/2022 1256     Risk Assessment/Calculations:   {Does this patient have ATRIAL FIBRILLATION?:[id number]}   Physical Exam:    VS:  There were no vitals taken for this visit.    Wt Readings from Last 3 Encounters:  05/18/22 176 lb 4.8 oz (80 kg)  05/16/22 176 lb 9.6 oz (80.1 kg)  01/12/22 164 lb 11.2 oz (74.7 kg)     GEN: *** Well nourished, well developed in no acute distress HEENT: Normal NECK: No JVD; No carotid bruits LYMPHATICS: No lymphadenopathy CARDIAC: ***RRR, no murmurs, rubs, gallops RESPIRATORY:  Clear to auscultation without rales, wheezing or rhonchi  ABDOMEN: Soft, non-tender, non-distended MUSCULOSKELETAL:  No edema; No deformity  SKIN: Warm and dry NEUROLOGIC:  Alert and oriented x 3 PSYCHIATRIC:  Normal affect   ASSESSMENT:    No diagnosis found. PLAN:    In order of problems listed above:  PAF  Atypical chest  pain  HTN  Polysubstance abuse Hepatitis C from IVDU  Medication nonadherance  Disposition: Follow up {follow up:15908} with ***   Shared Decision Making/Informed Consent   {Are you ordering a CV Procedure (e.g. stress test, cath, DCCV, TEE, etc)?   Press F2        :[id number]}    Signed, Daouda Lonzo David Stall, PA-C  05/31/2022 8:08 AM    [facility]

## 2022-06-01 ENCOUNTER — Encounter: Payer: Self-pay | Admitting: Medical

## 2022-06-01 ENCOUNTER — Ambulatory Visit (INDEPENDENT_AMBULATORY_CARE_PROVIDER_SITE_OTHER): Payer: Self-pay | Admitting: Medical

## 2022-06-01 ENCOUNTER — Other Ambulatory Visit: Payer: Self-pay

## 2022-06-01 VITALS — BP 110/80 | HR 57 | Ht 67.0 in | Wt 175.0 lb

## 2022-06-01 DIAGNOSIS — I480 Paroxysmal atrial fibrillation: Secondary | ICD-10-CM

## 2022-06-01 DIAGNOSIS — B1920 Unspecified viral hepatitis C without hepatic coma: Secondary | ICD-10-CM

## 2022-06-01 DIAGNOSIS — I10 Essential (primary) hypertension: Secondary | ICD-10-CM

## 2022-06-01 DIAGNOSIS — F1910 Other psychoactive substance abuse, uncomplicated: Secondary | ICD-10-CM

## 2022-06-01 MED ORDER — DILTIAZEM HCL 30 MG PO TABS
30.0000 mg | ORAL_TABLET | Freq: Every day | ORAL | 1 refills | Status: DC | PRN
  Filled 2022-06-01: qty 30, 30d supply, fill #0
  Filled 2022-09-05: qty 30, 30d supply, fill #1

## 2022-06-01 NOTE — Patient Instructions (Addendum)
Medication Instructions:  Your physician has recommended you make the following change in your medication:   START Diltiazem 30 mg daily as needed for elevated heart rates. An Rx has been sent to your pharmacy.  *If you need a refill on your cardiac medications before your next appointment, please call your pharmacy*   Lab Work: None ordered If you have labs (blood work) drawn today and your tests are completely normal, you will receive your results only by: MyChart Message (if you have MyChart) OR A paper copy in the mail If you have any lab test that is abnormal or we need to change your treatment, we will call you to review the results.   Testing/Procedures: None ordered   Follow-Up: At [facility], you and your health needs are our priority.  As part of our continuing mission to provide you with exceptional heart care, we have created designated Provider Care Teams.  These Care Teams include your primary Cardiologist (physician) and Advanced Practice Providers (APPs -  Physician Assistants and Nurse Practitioners) who all work together to provide you with the care you need, when you need it.  We recommend signing up for the patient portal called "MyChart".  Sign up information is provided on this After Visit Summary.  MyChart is used to connect with patients for Virtual Visits (Telemedicine).  Patients are able to view lab/test results, encounter notes, upcoming appointments, etc.  Non-urgent messages can be sent to your provider as well.   To learn more about what you can do with MyChart, go to [email or web address].    Your next appointment:   Your physician wants you to follow-up in: 6 months You will receive a reminder letter in the mail two months in advance. If you don't receive a letter, please call our office to schedule the follow-up appointment.   The format for your next appointment:   In Person  Provider:   You may see Julien Nordmann, MD or one of the following  Advanced Practice Providers on your designated Care Team:   Nicolasa Ducking, NP [name], PA-C Cadence Fransico Michael, [place]   Other Instructions N/A  Important Information About Sugar

## 2022-06-01 NOTE — Progress Notes (Signed)
Cardiology Office Note:    Date:  06/01/2022   ID:  Bruce [name], DOB 09-18-1971, MRN [id number]  PCP:  Rolm Gala, NP  [facility] Cardiologist:  Julien Nordmann, MD  [facility] Electrophysiologist:  None   Referring MD: Rolm Gala, NP   Chief Complaint: 6 month follow-up  History of Present Illness:    Bruce [name] is a 51 y.o. male with a hx of PAF, polysubstance abuse with ongoing cocaine, alcohol and tobacco abuse, HTN, hepatitis C in the setting of IV drug use, and medication noncompliance who presents for follow-up.    First seen in 03/2018 during hospitalization for afib in the context of binge drinking, cocaine and tobacco use. Echo at that time showed EF 60-65%, no WMA, G2DD, mild MR, normal size left atrium, normal RV systolic function and ventricular cavity size, PASP normal. He converted to SR with rate control. He was not started on [facility] given CHADSVAC of 1. Seen in the ED 09/2018 with recurrent afib with RVR in the setting of cocaine use. He was started on flecainide and continued cardizem. He converted to SR in the ED. Subsequent nuclear stress test in 10/2018 showed no evidence of ischemia and was overall low risk. Outpatient cardiac monitoring 10/2018 showed NSR, pSVT longest episode lasting 14 beats.    Echo 06/2019 showed EF 55-60%, normal LV diastolic function parameters, normal RV systolic function and ventricular cavity size, normal PASP, and no significant valvular abnormalities.    Admitted 2/14-2/19/22 with abdominal pain and chest pain and noted to be in afib RVR. CT chest showed findings consistent with recurrent pancreatitis. UDS positive for cocaine. He was placed on dilt infusion and subsequently converted to SR. Echo showed EF 55-60%, mlid LVH, normal RV function, trivial MR. Afib felt to be in the setting of medication noncompliance, polysubstance use, and pancreatitis.    Seen 02/09/21 and was doing well from a cardiac  perspective. He was in SR. CHADSVASC 1 and again [facility] deferred. Lexiscan was ordered for atypical chest pain.   Last seem 06/09/21 and was requesting referral to a hepatitis clinic, reported cocaine use and alcohol use.  Today, the patient reports he has been out of cardizem for 2 months. He is in NSR with heart rate of 57bpm. No chest pain or SOB. He went to the hepatitis clinic, no plans to go. He stopped using cocaine this year sometimes. He stopped drinking alcohol earlier this year. No Lle, orthopnea, or pnd. He is not smoking. He has occasional elevated heart rates.   Past Medical History:  Diagnosis Date   A-fib [facility])    Dental caries    Hepatitis C infection    Iritis    PAF (paroxysmal atrial fibrillation) (HCC)    a. diagnosed 03/19/18; b. CHADS2VASc 0   Polysubstance abuse (HCC)    a. cocaine, tobacco, etoh   Scrotal mass    Tick borne fever    Uveitis     Past Surgical History:  Procedure Laterality Date   FINGER SURGERY     infection, middle left   fractured jaw     SHOULDER SURGERY     right    Current Medications: Current Meds  Medication Sig   Acetaminophen (TYLENOL PO) Take by mouth daily as needed.   diltiazem (CARDIZEM) 30 MG tablet Take 1 tablet (30 mg total) by mouth daily as needed. For elevated heart rates   ondansetron (ZOFRAN) 4 MG tablet Take 1 tablet (4  mg total) by mouth every 8 (eight) hours as needed for nausea or vomiting.   predniSONE (DELTASONE) 10 MG tablet Take 6 tablets (60 mg total) by mouth daily with breakfast for 2 days, THEN 5 tablets (50 mg total) daily with breakfast for 2 days, THEN 4 tablets (40 mg total) daily with breakfast for 2 days, THEN 3 tablets (30 mg total) daily with breakfast for 2 days, THEN 2 tablets (20 mg total) daily with breakfast for 2 days, THEN 1 tablet (10 mg total) daily with breakfast for 2 days, THEN 0.5 tablets (5 mg total) daily with breakfast for 2 days.   tamsulosin (FLOMAX) 0.4 MG CAPS capsule Take 1 capsule  (0.4 mg total) by mouth once daily after breakfast.     Allergies:   Penicillins   Social History   Socioeconomic History   Marital status: Single    Spouse name: Not on file   Number of children: 0   Years of education: Not on file   Highest education level: Not on file  Occupational History    Employer: UNEMPLOYE  Tobacco Use   Smoking status: Former    Packs/day: 0.25    Types: Cigarettes    Quit date: 12/2021    Years since quitting: 0.4   Smokeless tobacco: Never   Tobacco comments:    Per pt  " I only smoke cigarettes when I drink beer"  Vaping Use   Vaping Use: Never used  Substance and Sexual Activity   Alcohol use: Not Currently    Comment: 6 pack per week   Drug use: Not Currently    Types: IV, Cocaine    Comment: Pt states that he last used "a while back"   Sexual activity: Not on file  Other Topics Concern   Not on file  Social History Narrative   Lives in [place]; with fiance. 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; cleaning foreclosed houses;    Social Determinants of Health   Financial Resource Strain: Low Risk  (09/13/2018)   Overall Financial Resource Strain (CARDIA)    Difficulty of Paying Living Expenses: Not hard at all  Food Insecurity: No Food Insecurity (01/12/2022)   Hunger Vital Sign    Worried About Running Out of Food in the Last Year: Never true    Ran Out of Food in the Last Year: Never true  Transportation Needs: No Transportation Needs (01/12/2022)   PRAPARE - Administrator, Civil Service (Medical): No    Lack of Transportation (Non-Medical): No  Physical Activity: Sufficiently Active (09/13/2018)   Exercise Vital Sign    Days of Exercise per Week: 7 days    Minutes of Exercise per Session: 30 min  Stress: Stress Concern Present (09/13/2018)   [employer] of Occupational Health - Occupational Stress Questionnaire    Feeling of Stress : To some extent  Social Connections: Moderately Integrated (09/13/2018)   Social  Connection and Isolation Panel [NHANES]    Frequency of Communication with Friends and Family: More than three times a week    Frequency of Social Gatherings with Friends and Family: More than three times a week    Attends Religious Services: More than 4 times per year    Active Member of [employer] or Organizations: No    Attends Banker Meetings: Never    Marital Status: Living with partner     Family History: The patient's family history includes Drug abuse in his father; Schizophrenia in his  mother.  ROS:   Please see the history of present illness.     All other systems reviewed and are negative.  EKGs/Labs/Other Studies Reviewed:    The following studies were reviewed today:  Myoview stress test 02/2021 Study Result   Narrative & Impression  T wave inversion was noted during stress. The study is normal. This is a low risk study. The left ventricular ejection fraction is normal (63%). There is no evidence for ischemia        Echo 12/22/20  1. Left ventricular ejection fraction, by estimation, is 55 to 60%. The  left ventricle has normal function. Left ventricular endocardial border  not optimally defined to evaluate regional wall motion. There is mild left  ventricular hypertrophy. Left  ventricular diastolic parameters are indeterminate.   2. Right ventricular systolic function is normal. The right ventricular  size is normal. Tricuspid regurgitation signal is inadequate for assessing  PA pressure.   3. The mitral valve is normal in structure. Trivial mitral valve  regurgitation. No evidence of mitral stenosis.   4. The aortic valve has an indeterminant number of cusps. Aortic valve  regurgitation is not visualized. No aortic stenosis is present.    EKG:  EKG is ordered today.  The ekg ordered today demonstrates SB 57bpm, nonspecific T wave changes  Recent Labs: 10/18/2021: Magnesium 2.1 12/09/2021: Hemoglobin 15.2; Platelets 236 05/18/2022: ALT 109; BUN  10; Creatinine, Ser 0.83; Potassium 4.0; Sodium 139  Recent Lipid Panel    Component Value Date/Time   CHOL 145 05/18/2022 1256   TRIG 113 05/18/2022 1256   HDL 53 05/18/2022 1256   CHOLHDL 2.7 05/18/2022 1256   CHOLHDL 1.9 12/22/2020 0506   VLDL 11 12/22/2020 0506   LDLCALC 72 05/18/2022 1256     Physical Exam:    VS:  BP 110/80 (BP Location: Left Arm, Patient Position: Sitting, Cuff Size: Normal)   Pulse (!) 57   Ht 5\' 7"  (1.702 m)   Wt 175 lb (79.4 kg)   SpO2 99%   BMI 27.41 kg/m     Wt Readings from Last 3 Encounters:  06/01/22 175 lb (79.4 kg)  05/18/22 176 lb 4.8 oz (80 kg)  05/16/22 176 lb 9.6 oz (80.1 kg)     GEN:  Well nourished, well developed in no acute distress HEENT: Normal NECK: No JVD; No carotid bruits LYMPHATICS: No lymphadenopathy CARDIAC: RR, bradycardia, no murmurs, rubs, gallops RESPIRATORY:  Clear to auscultation without rales, wheezing or rhonchi  ABDOMEN: Soft, non-tender, non-distended MUSCULOSKELETAL:  No edema; No deformity  SKIN: Warm and dry NEUROLOGIC:  Alert and oriented x 3 PSYCHIATRIC:  Normal affect   ASSESSMENT:    1. AF (paroxysmal atrial fibrillation) (HCC)   2. Essential hypertension   3. Polysubstance abuse (HCC)   4. Hepatitis C virus infection without hepatic coma, unspecified chronicity    PLAN:    In order of problems listed above:  Paroxysmal Afib He is in SB with a heart rate of 57bpm. He has been off diltiazem for 2 months. I will no restart this given low heart rate I will given him short acting Diltiazem 30mg  to use as needed for elevated heart rate. He is not on a/c given CHADSVASC of 1.   HTN BP is good today.   Polysubstance Abuse Hepatitis C from IVDU He stopped tobacco/alcohol/cocaine use earlier this year. He was congratulated. He is following with the hepatology.   Medication noncompliance He has been of diltiazem for 2 months,  however we will not restart it as above.   Disposition: Follow up in  6 month(s) with MD/APP    Signed, Clinten Howk David Stall, PA-C  06/01/2022 8:51 AM    [facility]

## 2022-06-02 ENCOUNTER — Ambulatory Visit: Payer: Self-pay | Admitting: Infectious Diseases

## 2022-06-06 ENCOUNTER — Ambulatory Visit: Payer: Self-pay | Admitting: Infectious Diseases

## 2022-06-06 ENCOUNTER — Ambulatory Visit: Payer: Self-pay | Admitting: Orthopaedic Surgery

## 2022-06-09 ENCOUNTER — Ambulatory Visit (INDEPENDENT_AMBULATORY_CARE_PROVIDER_SITE_OTHER): Payer: Self-pay | Admitting: Infectious Diseases

## 2022-06-09 ENCOUNTER — Other Ambulatory Visit: Payer: Self-pay

## 2022-06-09 ENCOUNTER — Encounter: Payer: Self-pay | Admitting: Infectious Diseases

## 2022-06-09 VITALS — BP 127/87 | HR 70 | Temp 97.6°F | Resp 16 | Ht 67.0 in | Wt 175.0 lb

## 2022-06-09 DIAGNOSIS — B182 Chronic viral hepatitis C: Secondary | ICD-10-CM

## 2022-06-09 DIAGNOSIS — Z23 Encounter for immunization: Secondary | ICD-10-CM

## 2022-06-09 NOTE — Progress Notes (Signed)
Patient Name: Bruce [name]  Date of Birth: [birth year]  MRN: [id number]  PCP: Rolm Gala, NP  Referring Provider: Rolm Gala, NP, Ph#: [phone]   CC:  Hepatitis c follow up     HPI/ROS:  [name] is a 51 y.o. male with chronic hepatitis C GT 1b, F4 on Fibrotest but Elastography was normal.   Mavyret started around end of November/early December 2022. Finished around March 2023.   Hep C RNA levels:  06/2021 - 31,800,000 million copies  11/2021 - < 15 copies  01/11/22 - < 15 copies  05/21/22 - 5.71 million copies   Here for review of labs and last hepatitis b vaccine.    Review of Systems  Constitutional:  Negative for chills and fever.  HENT:  Negative for sore throat.   Respiratory:  Negative for cough and shortness of breath.   Cardiovascular: Negative.   Gastrointestinal:  Negative for abdominal pain, diarrhea and vomiting.  Musculoskeletal:  Negative for myalgias and neck pain.  Skin:  Negative for rash.  Neurological:  Negative for headaches.  Psychiatric/Behavioral:  The patient is not nervous/anxious.     All other systems reviewed and are negative      Past Medical History:  Diagnosis Date   A-fib (HCC)    Dental caries    Hepatitis C infection    Iritis    PAF (paroxysmal atrial fibrillation) (HCC)    a. diagnosed 03/19/18; b. CHADS2VASc 0   Polysubstance abuse (HCC)    a. cocaine, tobacco, etoh   Scrotal mass    Tick borne fever    Uveitis     Prior to Admission medications   Medication Sig Start Date End Date Taking? Authorizing Provider  Cyanocobalamin (B-12 PO) Take by mouth as needed.    [provider]  diltiazem (CARDIZEM CD) 120 MG 24 hr capsule Take 1 capsule (120 mg total) by mouth daily. 06/09/21 07/09/21  Furth, Cadence H, PA-C    Allergies  Allergen Reactions   Penicillins Other (See Comments)    Did it involve swelling of the face/tongue/throat, SOB, or low BP? No Did it involve sudden or  severe rash/hives, skin peeling, or any reaction on the inside of your mouth or nose? No Did you need to seek medical attention at a hospital or doctor's office? No When did it last happen?       If all above answers are "NO", may proceed with cephalosporin use.    Social History   Tobacco Use   Smoking status: Former    Packs/day: 0.25    Types: Cigarettes    Quit date: 12/2021    Years since quitting: 0.5   Smokeless tobacco: Never   Tobacco comments:    Per pt  " I only smoke cigarettes when I drink beer"  Vaping Use   Vaping Use: Never used  Substance Use Topics   Alcohol use: Not Currently    Comment: 6 pack per week   Drug use: Not Currently    Types: IV, Cocaine    Comment: Pt states that he last used "a while back"    Family History  Problem Relation Age of Onset   Schizophrenia Mother    Drug abuse Father        throat cancer     Objective:   Vitals:   06/09/22 1138  BP: 127/87  Pulse: 70  Resp: 16  Temp: 97.6 F (36.4 C)  SpO2: 98%  No physical exam    Laboratory: Genotype:  Lab Results  Component Value Date   HCVGENOTYPE 1b 06/29/2021   HCV viral load:  Lab Results  Component Value Date   HCVQUANT 700,490 (H) 05/22/2012   Lab Results  Component Value Date   WBC 5.2 12/09/2021   HGB 15.2 12/09/2021   HCT 44.3 12/09/2021   MCV 92 12/09/2021   PLT 236 12/09/2021    Lab Results  Component Value Date   CREATININE 0.83 05/18/2022   BUN 10 05/18/2022   NA 139 05/18/2022   K 4.0 05/18/2022   CL 101 05/18/2022   CO2 22 05/18/2022    Lab Results  Component Value Date   ALT 109 (H) 05/18/2022   AST 88 (H) 05/18/2022   GGT 262 (H) 05/16/2022   ALKPHOS 122 (H) 05/18/2022    Lab Results  Component Value Date   INR 1.0 06/29/2021   BILITOT 0.4 05/18/2022   ALBUMIN 4.4 05/18/2022    Imaging:  CT scan in February 2022 as outlined above. +Hepatic steatosis   Assessment & Plan:   Problem List Items Addressed This Visit        Unprioritized   Hepatitis C infection    Unfortunately at 12 week check he had rebound viremia at 5.41 million copies. We discussed this today. Suspect that it was due to lapses in [place] with first attempt at treatment. We discussed a plan to re-treat with Vosevi once a day for 12 weeks. He will come here to pick up his next medication bottle in 4 weeks and check treatment HCV VL.   Final Hep B vaccine today.       Other Visit Diagnoses     Need for hepatitis B booster vaccination    -  Primary   Relevant Orders   Heplisav-B (HepB-CPG) Vaccine (Completed)       Rexene Alberts, MSN, NP-C [facility]  [email or web address] Pager: [phone] Office: [phone] [facility] Main Line: [phone]

## 2022-06-09 NOTE — Patient Instructions (Addendum)
Please come back in 4 weeks so we can repeat your blood work and give you your second bottle of your medication.   The new medication is called VOSEVI  One tablet once a day for 3 months   Try to not to miss any doses of the medicine to make sure we can get you cured.

## 2022-06-09 NOTE — Assessment & Plan Note (Addendum)
Unfortunately at 12 week check he had rebound viremia at 5.41 million copies. We discussed this today. Suspect that it was due to lapses in [place] with first attempt at treatment. We discussed a plan to re-treat with Vosevi once a day for 12 weeks. He will come here to pick up his next medication bottle in 4 weeks and check treatment HCV VL.   Final Hep B vaccine today.

## 2022-06-14 ENCOUNTER — Other Ambulatory Visit: Payer: Self-pay

## 2022-06-14 ENCOUNTER — Telehealth: Payer: Self-pay

## 2022-06-14 DIAGNOSIS — Z1211 Encounter for screening for malignant neoplasm of colon: Secondary | ICD-10-CM

## 2022-06-14 MED ORDER — PEG 3350-KCL-NA BICARB-NACL 420 G PO SOLR
Freq: Once | ORAL | 0 refills | Status: AC
  Filled 2022-06-14: qty 4000, 1d supply, fill #0

## 2022-06-14 NOTE — Telephone Encounter (Signed)
Patient contacted office to reschedule tomorrows colonoscopy with Dr. Allegra Lai.  Colonoscopy has been rescheduled to 07/25/22.  Rx sent to [facility].  Thanks,  [place]

## 2022-06-15 ENCOUNTER — Ambulatory Visit: Admission: RE | Admit: 2022-06-15 | Payer: Self-pay | Source: Ambulatory Visit | Admitting: Gastroenterology

## 2022-06-15 ENCOUNTER — Encounter: Admission: RE | Payer: Self-pay | Source: Ambulatory Visit

## 2022-06-15 SURGERY — COLONOSCOPY WITH PROPOFOL
Anesthesia: General

## 2022-06-21 ENCOUNTER — Telehealth: Payer: Self-pay

## 2022-06-21 NOTE — Telephone Encounter (Signed)
[facility] Patient Advocate Encounter  Patient's medications have been couriered to [facility] from [facility]: [phone], and will be delivered on  07/07/2022.

## 2022-07-07 ENCOUNTER — Ambulatory Visit: Payer: Self-pay | Admitting: Infectious Diseases

## 2022-07-07 ENCOUNTER — Ambulatory Visit: Payer: Self-pay | Admitting: Pharmacist

## 2022-07-08 ENCOUNTER — Telehealth: Payer: Self-pay

## 2022-07-08 NOTE — Telephone Encounter (Signed)
Hayze called requesting that his HCV medication be sent to his home. Advised him it has already been delivered to the office and that he can pick it up at his 9/7 appointment.   He would like future refills sent to his home as he is now working in [place]. Advised him that message would be sent to pharmacy team to see if this is possible.   P: [phone]  Sandie Ano, RN

## 2022-07-08 NOTE — Telephone Encounter (Signed)
His medication comes from a third party and it was set up on being delivered to the office so we can make sure he complete the therapy. If the Doctor is okay with giving him his last 2 month supply when he comes to his appointment on 09/07 we can do that. His next supply will be delivered to the office on 09/05.

## 2022-07-12 ENCOUNTER — Telehealth: Payer: Self-pay

## 2022-07-12 NOTE — Telephone Encounter (Signed)
[facility] Patient Advocate Encounter  Patient's medications have been couriered to [facility] from [facility] and will be picked up 07/14/22.  3rd bottle of Vosevi  Clearance Coots , CPhT Specialty Pharmacy Patient [facility] Phone: [phone] Fax:  [phone]

## 2022-07-14 ENCOUNTER — Ambulatory Visit: Payer: Self-pay | Admitting: Pharmacist

## 2022-07-14 ENCOUNTER — Telehealth: Payer: Self-pay | Admitting: Pharmacist

## 2022-07-14 NOTE — Telephone Encounter (Signed)
Patient has missed 3 appointments with me and Judeth Cornfield for his one-month HCV follow-up. This is already his second round of treatment due to rebound viremia after non-adherence to Mavyret. He has not picked up his second bottle of Vosevi either. LVM with him today requesting callback for rescheduling and need to pick up his refill. Triage, please get him scheduled with Judeth Cornfield if he calls back.   Margarite Gouge, PharmD, CPP, BCIDP Clinical Pharmacist Practitioner Infectious Diseases Clinical Pharmacist [facility]

## 2022-07-15 NOTE — Telephone Encounter (Signed)
Spoke with Odarius, he is scheduled to come in next Tuesday morning, 9/19.  Sandie Ano, RN

## 2022-07-15 NOTE — Telephone Encounter (Signed)
Thank you Megan.

## 2022-07-25 ENCOUNTER — Ambulatory Visit: Payer: Self-pay | Admitting: Certified Registered Nurse Anesthetist

## 2022-07-25 ENCOUNTER — Other Ambulatory Visit (HOSPITAL_COMMUNITY): Payer: Self-pay

## 2022-07-25 ENCOUNTER — Other Ambulatory Visit: Payer: Self-pay

## 2022-07-25 ENCOUNTER — Other Ambulatory Visit: Payer: Self-pay | Admitting: Pharmacist

## 2022-07-25 ENCOUNTER — Ambulatory Visit
Admission: RE | Admit: 2022-07-25 | Discharge: 2022-07-25 | Disposition: A | Discharge status: Home / Self Care | Payer: Self-pay | Attending: Gastroenterology | Admitting: Gastroenterology

## 2022-07-25 ENCOUNTER — Encounter
Admission: RE | Disposition: A | Discharge status: Home / Self Care | Payer: Self-pay | Source: Home / Self Care | Attending: Gastroenterology

## 2022-07-25 DIAGNOSIS — Z538 Procedure and treatment not carried out for other reasons: Secondary | ICD-10-CM | POA: Insufficient documentation

## 2022-07-25 DIAGNOSIS — B182 Chronic viral hepatitis C: Secondary | ICD-10-CM

## 2022-07-25 DIAGNOSIS — Z1211 Encounter for screening for malignant neoplasm of colon: Secondary | ICD-10-CM | POA: Insufficient documentation

## 2022-07-25 HISTORY — PX: COLONOSCOPY WITH PROPOFOL: SHX5780

## 2022-07-25 SURGERY — COLONOSCOPY WITH PROPOFOL
Anesthesia: General

## 2022-07-25 MED ORDER — PEG 3350-KCL-NABCB-NACL-NASULF 236 G PO SOLR
4000.0000 mL | ORAL | 0 refills | Status: DC
  Filled 2022-07-25: qty 4000, 1d supply, fill #0

## 2022-07-25 NOTE — OR Nursing (Signed)
PT ATE BREAKFAST AND SUPPER LAST NIGHT. LAST NIGHT STEAK AND POTATOES. PT WILL BE CANCELLED AND RESCEDULES TOMOOROW. DR Marius Ditch WILL CALL IN NEW PREP. PT I/S CLEAR LIQUIDS ONLY TODAY

## 2022-07-26 ENCOUNTER — Encounter: Payer: Self-pay | Admitting: Certified Registered Nurse Anesthetist

## 2022-07-26 ENCOUNTER — Encounter
Admission: RE | Disposition: A | Discharge status: Home / Self Care | Payer: Self-pay | Source: Ambulatory Visit | Attending: Gastroenterology

## 2022-07-26 ENCOUNTER — Encounter: Payer: Self-pay | Admitting: Gastroenterology

## 2022-07-26 ENCOUNTER — Ambulatory Visit: Payer: Self-pay | Admitting: Infectious Diseases

## 2022-07-26 ENCOUNTER — Ambulatory Visit
Admission: RE | Admit: 2022-07-26 | Discharge: 2022-07-26 | Disposition: A | Discharge status: Home / Self Care | Payer: Self-pay | Source: Ambulatory Visit | Attending: Gastroenterology | Admitting: Gastroenterology

## 2022-07-26 DIAGNOSIS — Z1211 Encounter for screening for malignant neoplasm of colon: Secondary | ICD-10-CM

## 2022-07-26 DIAGNOSIS — K029 Dental caries, unspecified: Secondary | ICD-10-CM

## 2022-07-26 DIAGNOSIS — B182 Chronic viral hepatitis C: Secondary | ICD-10-CM

## 2022-07-26 DIAGNOSIS — I4891 Unspecified atrial fibrillation: Secondary | ICD-10-CM

## 2022-07-26 DIAGNOSIS — N5089 Other specified disorders of the male genital organs: Secondary | ICD-10-CM

## 2022-07-26 SURGERY — COLONOSCOPY
Anesthesia: General

## 2022-07-26 MED ORDER — SODIUM CHLORIDE 0.9 % IV SOLN: INTRAVENOUS | Status: DC

## 2022-07-27 LAB — HEPATITIS C RNA QUANTITATIVE
HCV Quantitative Log: <1.18 log IU/mL
HCV RNA, PCR, QN: <15 IU/mL

## 2022-07-27 LAB — COMPREHENSIVE METABOLIC PANEL
AG Ratio: 1.3 (calc) (ref 1.0–2.5)
ALT: 27 U/L (ref 9–46)
AST: 26 U/L (ref 10–35)
Albumin: 4 g/dL (ref 3.6–5.1)
Alkaline phosphatase (APISO): 80 U/L (ref 35–144)
BUN: 8 mg/dL (ref 7–25)
CO2: 26 mmol/L (ref 20–32)
Calcium: 9.4 mg/dL (ref 8.6–10.3)
Chloride: 106 mmol/L (ref 98–110)
Creat: 0.87 mg/dL (ref 0.70–1.30)
Globulin: 3.2 g/dL (calc) (ref 1.9–3.7)
Glucose, Bld: 172 mg/dL — ABNORMAL HIGH (ref 65–99)
Potassium: 4.3 mmol/L (ref 3.5–5.3)
Sodium: 139 mmol/L (ref 135–146)
Total Bilirubin: 0.4 mg/dL (ref 0.2–1.2)
Total Protein: 7.2 g/dL (ref 6.1–8.1)

## 2022-07-27 NOTE — Progress Notes (Signed)
Patient arrived for his Colonoscopy with Dr Marius Ditch 07/26/22.  Per his prior history, a UDS was ordered for the patient prior to his procedure.  After many attempts, the patient was unable to void and decided to leave and reschedule his Colonoscopy for another day.  Dr Verlin Grills office is to call and reschedule.

## 2022-07-28 ENCOUNTER — Encounter: Payer: Self-pay | Admitting: Anesthesiology

## 2022-07-29 ENCOUNTER — Encounter: Payer: Self-pay | Admitting: Gastroenterology

## 2022-08-18 ENCOUNTER — Ambulatory Visit: Payer: Self-pay | Admitting: Gerontology

## 2022-08-23 ENCOUNTER — Encounter: Payer: Self-pay | Admitting: Infectious Diseases

## 2022-08-23 ENCOUNTER — Ambulatory Visit (INDEPENDENT_AMBULATORY_CARE_PROVIDER_SITE_OTHER): Payer: Self-pay | Admitting: Infectious Diseases

## 2022-08-23 ENCOUNTER — Other Ambulatory Visit: Payer: Self-pay

## 2022-08-23 VITALS — BP 137/80 | HR 69 | Resp 16 | Ht 67.0 in | Wt 170.1 lb

## 2022-08-23 DIAGNOSIS — B182 Chronic viral hepatitis C: Secondary | ICD-10-CM

## 2022-08-23 NOTE — Patient Instructions (Addendum)
Please stop by the lab today to make sure your medication has continued to work for you.   Start your last bottle Thursday of this week - you should finish by November 16th based on this schedule.   Please schedule your next appointment with pharmacy the week of February 19th to recheck your blood work to see if you have had a cure this time.

## 2022-08-23 NOTE — Progress Notes (Signed)
Patient Name: Bruce [name]  Date of Birth: 01/24/1971  MRN: [id number]  PCP: Rolm Gala, NP  Referring Provider: Rolm Gala, NP, Ph#: [phone]   CC:  Hepatitis c follow up     HPI/ROS:  Bruce [name] [name] is a 51 y.o. male with chronic hepatitis C GT 1b, F4 on Fibrotest but Elastography was normal.   Failed treatment with Mavyret with rebound viremia July 2023.   Hep C RNA levels:  06/2021 - 31,800,000 million copies  11/2021 - < 15 copies  01/11/22 - < 15 copies  05/21/22 - 5.71 million copies  07/25/22 - < 15 copies    Picked up 2 bottles of Vosevi so far and 3rd is here - started the beginning of August (8/03 first pick up) second 8/31 - should have picked up last bottle at the end of September. Says he has 2 pills left in his current bottle. Does not think he has missed more than a week since starting.    Review of Systems  Constitutional:  Negative for chills and fever.  HENT:  Negative for sore throat.   Respiratory:  Negative for cough and shortness of breath.   Cardiovascular: Negative.   Gastrointestinal:  Negative for abdominal pain, diarrhea and vomiting.  Musculoskeletal:  Negative for myalgias and neck pain.  Skin:  Negative for rash.  Neurological:  Negative for headaches.  Psychiatric/Behavioral:  The patient is not nervous/anxious.     All other systems reviewed and are negative      Past Medical History:  Diagnosis Date   A-fib (HCC)    Dental caries    Hepatitis C infection    Iritis    PAF (paroxysmal atrial fibrillation) (HCC)    a. diagnosed 03/19/18; b. CHADS2VASc 0   Polysubstance abuse (HCC)    a. cocaine, tobacco, etoh   Scrotal mass    Tick borne fever    Uveitis     Prior to Admission medications   Medication Sig Start Date End Date Taking? Authorizing Provider  Cyanocobalamin (B-12 PO) Take by mouth as needed.    [provider]  diltiazem (CARDIZEM CD) 120 MG 24 hr capsule Take 1 capsule (120 mg  total) by mouth daily. 06/09/21 07/09/21  Furth, Cadence H, PA-C    Allergies  Allergen Reactions   Penicillins Other (See Comments)    Did it involve swelling of the face/tongue/throat, SOB, or low BP? No Did it involve sudden or severe rash/hives, skin peeling, or any reaction on the inside of your mouth or nose? No Did you need to seek medical attention at a hospital or doctor's office? No When did it last happen?       If all above answers are "NO", may proceed with cephalosporin use.    Social History   Tobacco Use   Smoking status: Former    Packs/day: 0.25    Types: Cigarettes    Quit date: 12/2021    Years since quitting: 0.7   Smokeless tobacco: Never   Tobacco comments:    Per pt  " I only smoke cigarettes when I drink beer"  Vaping Use   Vaping Use: Never used  Substance Use Topics   Alcohol use: Not Currently    Comment: 6 pack per week   Drug use: Not Currently    Types: IV, Cocaine    Comment: Pt states that he last used "a while back"    Family History  Problem Relation  Age of Onset   Schizophrenia Mother    Drug abuse Father        throat cancer     Objective:   Vitals:   08/23/22 1139  BP: 137/80  Pulse: 69  Resp: 16  SpO2: 99%    No physical exam    Laboratory: Genotype:  Lab Results  Component Value Date   HCVGENOTYPE 1b 06/29/2021   HCV viral load:  Lab Results  Component Value Date   HCVQUANT 700,490 (H) 05/22/2012   Lab Results  Component Value Date   WBC 5.2 12/09/2021   HGB 15.2 12/09/2021   HCT 44.3 12/09/2021   MCV 92 12/09/2021   PLT 236 12/09/2021    Lab Results  Component Value Date   CREATININE 0.87 07/25/2022   BUN 8 07/25/2022   NA 139 07/25/2022   K 4.3 07/25/2022   CL 106 07/25/2022   CO2 26 07/25/2022    Lab Results  Component Value Date   ALT 27 07/25/2022   AST 26 07/25/2022   GGT 262 (H) 05/16/2022   ALKPHOS 122 (H) 05/18/2022    Lab Results  Component Value Date   INR 1.0 06/29/2021    BILITOT 0.4 07/25/2022   ALBUMIN 4.4 05/18/2022    Imaging:  CT scan in February 2022 as outlined above. +Hepatic steatosis   Assessment & Plan:   Problem List Items Addressed This Visit       Unprioritized   Hepatitis C infection - Primary    Failed Mavyret x 8 weeks - likely due to lapse in treatment. He has been started on Vosevi to try again but from my calculations of timing of drug pick up, he probably lapsed again in treatment. He says he has not so we will see. HCV RNA today and have him back in February 19th to see pharmacy team while I am out of town for [facility] check. Repeat LFTs today.   He left with 3rd and final bottle of vosevi today  Flu shot given.       Relevant Orders   Hepatitis C RNA quantitative ([facility])   Hepatic function panel   Rexene Alberts, MSN, NP-C [facility]  [email or web address] Pager: [phone] Office: [phone] [facility] Main Line: [phone]

## 2022-08-23 NOTE — Assessment & Plan Note (Addendum)
Failed Mavyret x 8 weeks - likely due to lapse in treatment. He has been started on Vosevi to try again but from my calculations of timing of drug pick up, he probably lapsed again in treatment. He says he has not so we will see. HCV RNA today and have him back in February 19th to see pharmacy team while I am out of town for [facility] check. Repeat LFTs today.   He left with 3rd and final bottle of vosevi today  Flu shot given.

## 2022-08-24 LAB — HEPATITIS C RNA QUANTITATIVE
HCV Quantitative Log: <1.18 log IU/mL
HCV RNA, PCR, QN: <15 IU/mL

## 2022-08-24 LAB — HEPATIC FUNCTION PANEL
AG Ratio: 1.3 (calc) (ref 1.0–2.5)
ALT: 22 U/L (ref 9–46)
AST: 21 U/L (ref 10–35)
Albumin: 4.2 g/dL (ref 3.6–5.1)
Alkaline phosphatase (APISO): 102 U/L (ref 35–144)
Bilirubin, Direct: 0.2 mg/dL (ref 0.0–0.2)
Globulin: 3.3 g/dL (calc) (ref 1.9–3.7)
Indirect Bilirubin: 0.4 mg/dL (calc) (ref 0.2–1.2)
Total Bilirubin: 0.6 mg/dL (ref 0.2–1.2)
Total Protein: 7.5 g/dL (ref 6.1–8.1)

## 2022-08-25 ENCOUNTER — Telehealth: Payer: Self-pay

## 2022-08-25 ENCOUNTER — Ambulatory Visit: Payer: Self-pay | Admitting: Gerontology

## 2022-08-25 NOTE — Telephone Encounter (Signed)
Called patient to relay results, no answer and mailbox full.  Beryle Flock, RN

## 2022-08-25 NOTE — Telephone Encounter (Signed)
Patient returned call, notified him that his medication is working as expected and that liver function tests have normalized. Advised him to finish Vosevi as discussed with Colletta [place] and follow up in February. Patient verbalized understanding and has no further questions.   Beryle Flock, RN

## 2022-08-25 NOTE — Telephone Encounter (Signed)
-----   Message from [place] Callas, NP sent at 08/25/2022  7:55 AM EDT ----- Please call Bruce [name] to let him know they so far the medication seems to be working as expected. His liver function tests have normalized.   Finish up the rest of the Vosevi like we discussed and we shall see if he is cured at his next visit in February!

## 2022-09-05 ENCOUNTER — Other Ambulatory Visit: Payer: Self-pay

## 2022-09-05 ENCOUNTER — Other Ambulatory Visit: Payer: Self-pay | Admitting: Gerontology

## 2022-09-05 DIAGNOSIS — Z87438 Personal history of other diseases of male genital organs: Secondary | ICD-10-CM

## 2022-09-05 DIAGNOSIS — I480 Paroxysmal atrial fibrillation: Secondary | ICD-10-CM

## 2022-09-05 DIAGNOSIS — I10 Essential (primary) hypertension: Secondary | ICD-10-CM

## 2022-09-06 ENCOUNTER — Other Ambulatory Visit: Payer: Self-pay

## 2022-09-06 MED FILL — Diltiazem HCl Coated Beads Cap ER 24HR 120 MG: ORAL | 30 days supply | Qty: 30 | Fill #0 | Status: AC

## 2022-09-06 MED FILL — Tamsulosin HCl Cap 0.4 MG: ORAL | 30 days supply | Qty: 30 | Fill #0 | Status: AC

## 2022-10-12 ENCOUNTER — Other Ambulatory Visit: Payer: Self-pay | Admitting: Gerontology

## 2022-10-12 ENCOUNTER — Other Ambulatory Visit: Payer: Self-pay

## 2022-10-12 ENCOUNTER — Other Ambulatory Visit: Payer: Self-pay | Admitting: Medical

## 2022-10-12 DIAGNOSIS — I10 Essential (primary) hypertension: Secondary | ICD-10-CM

## 2022-10-12 DIAGNOSIS — I480 Paroxysmal atrial fibrillation: Secondary | ICD-10-CM

## 2022-10-12 DIAGNOSIS — Z87438 Personal history of other diseases of male genital organs: Secondary | ICD-10-CM

## 2022-10-13 ENCOUNTER — Other Ambulatory Visit: Payer: Self-pay

## 2022-10-13 MED ORDER — DILTIAZEM HCL 30 MG PO TABS
30.0000 mg | ORAL_TABLET | Freq: Every day | ORAL | 1 refills | Status: DC | PRN
  Filled 2022-10-13 – 2022-10-25 (×2): qty 30, 30d supply, fill #0
  Filled 2023-05-16: qty 30, 30d supply, fill #1

## 2022-10-14 ENCOUNTER — Other Ambulatory Visit: Payer: Self-pay

## 2022-10-14 ENCOUNTER — Other Ambulatory Visit: Payer: Self-pay | Admitting: Gerontology

## 2022-10-14 DIAGNOSIS — I480 Paroxysmal atrial fibrillation: Secondary | ICD-10-CM

## 2022-10-14 DIAGNOSIS — I10 Essential (primary) hypertension: Secondary | ICD-10-CM

## 2022-10-14 DIAGNOSIS — Z87438 Personal history of other diseases of male genital organs: Secondary | ICD-10-CM

## 2022-10-18 ENCOUNTER — Telehealth: Payer: Self-pay | Admitting: Gerontology

## 2022-10-18 ENCOUNTER — Other Ambulatory Visit: Payer: Self-pay

## 2022-10-18 NOTE — Telephone Encounter (Signed)
Called pt to make appt. Pt needs refills but cannot have them untik he sees [place]. Left message

## 2022-10-19 ENCOUNTER — Other Ambulatory Visit: Payer: Self-pay

## 2022-10-20 ENCOUNTER — Other Ambulatory Visit: Payer: Self-pay

## 2022-10-20 NOTE — Telephone Encounter (Signed)
Patient must have OV with Lanora Manis, NP prior to refills. Have  called patient and LMTC x 2

## 2022-10-21 ENCOUNTER — Other Ambulatory Visit: Payer: Self-pay

## 2022-10-25 ENCOUNTER — Other Ambulatory Visit: Payer: Self-pay

## 2022-11-08 ENCOUNTER — Encounter: Payer: Self-pay | Admitting: Gerontology

## 2022-11-08 ENCOUNTER — Ambulatory Visit: Payer: Self-pay | Admitting: Gerontology

## 2022-11-08 ENCOUNTER — Other Ambulatory Visit: Payer: Self-pay

## 2022-11-08 VITALS — BP 123/86 | HR 68 | Temp 98.0°F | Resp 16 | Ht 67.0 in | Wt 174.4 lb

## 2022-11-08 DIAGNOSIS — I480 Paroxysmal atrial fibrillation: Secondary | ICD-10-CM

## 2022-11-08 DIAGNOSIS — R7303 Prediabetes: Secondary | ICD-10-CM

## 2022-11-08 DIAGNOSIS — Z87438 Personal history of other diseases of male genital organs: Secondary | ICD-10-CM

## 2022-11-08 DIAGNOSIS — I10 Essential (primary) hypertension: Secondary | ICD-10-CM

## 2022-11-08 LAB — GLUCOSE, POCT (MANUAL RESULT ENTRY): POC Glucose: 101 mg/dl — AB (ref 70–99)

## 2022-11-08 LAB — POCT GLYCOSYLATED HEMOGLOBIN (HGB A1C): Hemoglobin A1C: 6.1 % — AB (ref 4.0–5.6)

## 2022-11-08 MED ORDER — DILTIAZEM HCL ER COATED BEADS 120 MG PO CP24
ORAL_CAPSULE | ORAL | 2 refills | Status: DC
  Filled 2022-11-08: qty 30, 30d supply, fill #0
  Filled 2022-12-09: qty 30, 30d supply, fill #1
  Filled 2023-01-12: qty 30, 30d supply, fill #2

## 2022-11-08 MED ORDER — TAMSULOSIN HCL 0.4 MG PO CAPS
0.4000 mg | ORAL_CAPSULE | Freq: Every day | ORAL | 2 refills | Status: DC
  Filled 2022-11-08: qty 30, 30d supply, fill #0
  Filled 2023-01-18: qty 30, 30d supply, fill #1

## 2022-11-08 NOTE — Patient Instructions (Signed)

## 2022-11-08 NOTE — Progress Notes (Signed)
Established Patient Office Visit  Subjective   [name], male    DOB: 1971-01-01  Age: 52 y.o. MRN: [id number]  Chief Complaint  Patient presents with   Follow-up   Prediabetes   Atrial Fibrillation    Been out of Diltiazem 165m x 2 weeks    HPI  This is a 52y/o male with a h/o afib, hypertension, BPH and prediabetes here for a routine follow up visit and medication refill. His HgbA1c checked during visit decreased from 6.2% to 6.1%, he states that he continues to modify his diet and exercise as tolerated. He states that he's compliant with his medications, but out of Diltiazem and Flomax for 2 weeks. He denies any side effects, states that he will follow up at the Infectious disease clinic on 12/20/22 after completing his treatment for Chronic Hepatitis C. He states that he will schedule a follow up appointment with Cardiology for Afib and Urology for history of BPH. Overall, he states that he's doing well and offers no further complain.  Review of Systems  Constitutional: Negative.   Eyes: Negative.   Respiratory: Negative.    Cardiovascular: Negative.   Genitourinary: Negative.   Skin: Negative.   Neurological: Negative.   Psychiatric/Behavioral: Negative.        Objective:     BP 123/86 (BP Location: Right Arm, Patient Position: Sitting, Cuff Size: Large)   Pulse 68   Temp 98 F (36.7 C) (Oral)   Resp 16   Ht _0  (1.702 m)   Wt 174 lb 6.4 oz (79.1 kg)   SpO2 97%   BMI 27.31 kg/m  BP Readings from Last 3 Encounters:  11/08/22 123/86  08/23/22 137/80  06/09/22 127/87   Wt Readings from Last 3 Encounters:  11/08/22 174 lb 6.4 oz (79.1 kg)  08/23/22 170 lb 1.6 oz (77.2 kg)  06/09/22 175 lb (79.4 kg)      Physical Exam HENT:     Head: Normocephalic and atraumatic.     Mouth/Throat:     Mouth: Mucous membranes are moist.  Eyes:     Pupils: Pupils are equal, round, and reactive to light.  Cardiovascular:     Rate and Rhythm: Normal  rate and regular rhythm.     Pulses: Normal pulses.     Heart sounds: Normal heart sounds.  Pulmonary:     Effort: Pulmonary effort is normal.     Breath sounds: Normal breath sounds.  Skin:    General: Skin is warm.  Neurological:     General: No focal deficit present.     Mental Status: He is alert and oriented to person, place, and time. Mental status is at baseline.  Psychiatric:        Mood and Affect: Mood normal.        Behavior: Behavior normal.        Thought Content: Thought content normal.        Judgment: Judgment normal.      Results for orders placed or performed in visit on 11/08/22  POCT Glucose (CBG)  Result Value Ref Range   POC Glucose 101 (A) 70 - 99 mg/dl  POCT HgB A1C  Result Value Ref Range   Hemoglobin A1C 6.1 (A) 4.0 - 5.6 %   HbA1c POC (<> result, manual entry)     HbA1c, POC (prediabetic range)     HbA1c, POC (controlled diabetic range)      Last CBC Lab Results  Component Value Date   WBC 5.2 12/09/2021   HGB 15.2 12/09/2021   HCT 44.3 12/09/2021   MCV 92 12/09/2021   MCH 31.5 12/09/2021   RDW 12.9 12/09/2021   PLT 236 82/42/3536   Last metabolic panel Lab Results  Component Value Date   GLUCOSE 172 (H) 07/25/2022   NA 139 07/25/2022   K 4.3 07/25/2022   CL 106 07/25/2022   CO2 26 07/25/2022   BUN 8 07/25/2022   CREATININE 0.87 07/25/2022   EGFR 105 05/18/2022   CALCIUM 9.4 07/25/2022   PHOS 3.2 10/18/2021   PROT 7.5 08/23/2022   ALBUMIN 4.4 05/18/2022   LABGLOB 3.4 05/18/2022   AGRATIO 1.3 05/18/2022   BILITOT 0.6 08/23/2022   ALKPHOS 122 (H) 05/18/2022   AST 21 08/23/2022   ALT 22 08/23/2022   ANIONGAP 5 10/18/2021   Last lipids Lab Results  Component Value Date   CHOL 145 05/18/2022   HDL 53 05/18/2022   LDLCALC 72 05/18/2022   TRIG 113 05/18/2022   CHOLHDL 2.7 05/18/2022   Last hemoglobin A1c Lab Results  Component Value Date   HGBA1C 6.1 (A) 11/08/2022      The 10-year ASCVD risk score (Arnett DK, et  al., 2019) is: 8.4%    Assessment & Plan:   1. Prediabetes -His HgbA1c was 6.1%, he declines Metformin therapy, states that he will continue to make healthy lifestyle changes. - POCT Glucose (CBG) - POCT HgB A1C  2. Paroxysmal atrial fibrillation (HCC) - He will continue current medication, and will follow up with Cardiologist. - diltiazem (CARTIA XT) 120 MG 24 hr capsule; Take 1 capsule (120 mg total) by mouth once daily.  Dispense: 30 capsule; Refill: 2  3. Essential (primary) hypertension - His blood pressure is under control, he will continue current medication, DASH diet and exercise as tolerated. - diltiazem (CARTIA XT) 120 MG 24 hr capsule; Take 1 capsule (120 mg total) by mouth once daily.  Dispense: 30 capsule; Refill: 2  4. History of BPH - He denies any discomfort, will continue on current medication. - tamsulosin (FLOMAX) 0.4 MG CAPS capsule; Take 1 capsule (0.4 mg total) by mouth once daily after breakfast.  Dispense: 30 capsule; Refill: 2 - Ambulatory referral to Urology   Return in about 18 weeks (around 03/14/2023), or if symptoms worsen or fail to improve.    Gaynor Ferreras Jerold Coombe, NP

## 2022-12-09 ENCOUNTER — Other Ambulatory Visit: Payer: Self-pay

## 2022-12-20 ENCOUNTER — Ambulatory Visit: Payer: Self-pay | Admitting: Infectious Diseases

## 2023-01-09 ENCOUNTER — Other Ambulatory Visit: Payer: Self-pay

## 2023-01-09 ENCOUNTER — Encounter: Payer: Self-pay | Admitting: Emergency Medicine

## 2023-01-09 ENCOUNTER — Emergency Department: Payer: Self-pay

## 2023-01-09 ENCOUNTER — Emergency Department
Admission: EM | Admit: 2023-01-09 | Discharge: 2023-01-09 | Disposition: A | Discharge status: Home / Self Care | Payer: Self-pay | Attending: Emergency Medicine | Admitting: Emergency Medicine

## 2023-01-09 DIAGNOSIS — R3916 Straining to void: Secondary | ICD-10-CM | POA: Diagnosis present

## 2023-01-09 DIAGNOSIS — Z820 Family history of epilepsy and other diseases of the nervous system: Secondary | ICD-10-CM

## 2023-01-09 DIAGNOSIS — E876 Hypokalemia: Secondary | ICD-10-CM | POA: Diagnosis present

## 2023-01-09 DIAGNOSIS — B1920 Unspecified viral hepatitis C without hepatic coma: Secondary | ICD-10-CM | POA: Diagnosis present

## 2023-01-09 DIAGNOSIS — Z880 Allergy status to penicillin: Secondary | ICD-10-CM

## 2023-01-09 DIAGNOSIS — F1020 Alcohol dependence, uncomplicated: Secondary | ICD-10-CM | POA: Diagnosis present

## 2023-01-09 DIAGNOSIS — Z79899 Other long term (current) drug therapy: Secondary | ICD-10-CM

## 2023-01-09 DIAGNOSIS — Z813 Family history of other psychoactive substance abuse and dependence: Secondary | ICD-10-CM

## 2023-01-09 DIAGNOSIS — Z808 Family history of malignant neoplasm of other organs or systems: Secondary | ICD-10-CM

## 2023-01-09 DIAGNOSIS — Z818 Family history of other mental and behavioral disorders: Secondary | ICD-10-CM

## 2023-01-09 DIAGNOSIS — Z87891 Personal history of nicotine dependence: Secondary | ICD-10-CM

## 2023-01-09 DIAGNOSIS — K5900 Constipation, unspecified: Secondary | ICD-10-CM | POA: Diagnosis present

## 2023-01-09 DIAGNOSIS — I10 Essential (primary) hypertension: Secondary | ICD-10-CM | POA: Diagnosis present

## 2023-01-09 DIAGNOSIS — K852 Alcohol induced acute pancreatitis without necrosis or infection: Principal | ICD-10-CM | POA: Diagnosis present

## 2023-01-09 DIAGNOSIS — I4891 Unspecified atrial fibrillation: Secondary | ICD-10-CM | POA: Insufficient documentation

## 2023-01-09 DIAGNOSIS — K861 Other chronic pancreatitis: Secondary | ICD-10-CM | POA: Diagnosis present

## 2023-01-09 DIAGNOSIS — I480 Paroxysmal atrial fibrillation: Secondary | ICD-10-CM | POA: Diagnosis present

## 2023-01-09 DIAGNOSIS — N401 Enlarged prostate with lower urinary tract symptoms: Secondary | ICD-10-CM | POA: Diagnosis present

## 2023-01-09 LAB — URINALYSIS, ROUTINE W REFLEX MICROSCOPIC
Bilirubin Urine: NEGATIVE
Glucose, UA: NEGATIVE mg/dL
Hgb urine dipstick: NEGATIVE
Ketones, ur: NEGATIVE mg/dL
Leukocytes,Ua: NEGATIVE
Nitrite: NEGATIVE
Protein, ur: NEGATIVE mg/dL
Specific Gravity, Urine: 1.034 — ABNORMAL HIGH (ref 1.005–1.030)
pH: 5 (ref 5.0–8.0)

## 2023-01-09 LAB — CBC
HCT: 45.2 % (ref 39.0–52.0)
Hemoglobin: 15.8 g/dL (ref 13.0–17.0)
MCH: 30 pg (ref 26.0–34.0)
MCHC: 35 g/dL (ref 30.0–36.0)
MCV: 85.9 fL (ref 80.0–100.0)
Platelets: 218 10*3/uL (ref 150–400)
RBC: 5.26 MIL/uL (ref 4.22–5.81)
RDW: 12.9 % (ref 11.5–15.5)
WBC: 4.2 10*3/uL (ref 4.0–10.5)
nRBC: 0 % (ref 0.0–0.2)

## 2023-01-09 LAB — COMPREHENSIVE METABOLIC PANEL
ALT: 30 U/L (ref 0–44)
AST: 38 U/L (ref 15–41)
Albumin: 4.2 g/dL (ref 3.5–5.0)
Alkaline Phosphatase: 100 U/L (ref 38–126)
Anion gap: 9 (ref 5–15)
BUN: 13 mg/dL (ref 6–20)
CO2: 23 mmol/L (ref 22–32)
Calcium: 9.2 mg/dL (ref 8.9–10.3)
Chloride: 107 mmol/L (ref 98–111)
Creatinine, Ser: 0.82 mg/dL (ref 0.61–1.24)
GFR, Estimated: >60 mL/min (ref >60)
Glucose, Bld: 129 mg/dL — ABNORMAL HIGH (ref 70–99)
Potassium: 3.6 mmol/L (ref 3.5–5.1)
Sodium: 139 mmol/L (ref 135–145)
Total Bilirubin: 0.5 mg/dL (ref 0.3–1.2)
Total Protein: 8 g/dL (ref 6.5–8.1)

## 2023-01-09 LAB — LIPASE, BLOOD: Lipase: 31 U/L (ref 11–51)

## 2023-01-09 MED ORDER — IOHEXOL 300 MG/ML  SOLN
100.0000 mL | Freq: Once | INTRAMUSCULAR | Status: AC | PRN
  Administered 2023-01-09: 100 mL via INTRAVENOUS

## 2023-01-09 MED ORDER — MORPHINE SULFATE (PF) 4 MG/ML IV SOLN
4.0000 mg | Freq: Once | INTRAVENOUS | Status: AC
  Administered 2023-01-09: 4 mg via INTRAVENOUS
  Filled 2023-01-09: qty 1

## 2023-01-09 MED ORDER — SODIUM CHLORIDE 0.9 % IV BOLUS
1000.0000 mL | Freq: Once | INTRAVENOUS | Status: AC
  Administered 2023-01-09: 1000 mL via INTRAVENOUS

## 2023-01-09 MED ORDER — OXYCODONE HCL 5 MG PO TABS: 5.0000 mg | ORAL_TABLET | Freq: Three times a day (TID) | ORAL | 0 refills | Status: DC | PRN

## 2023-01-09 MED ORDER — PANTOPRAZOLE SODIUM 40 MG IV SOLR
40.0000 mg | Freq: Once | INTRAVENOUS | Status: AC
  Administered 2023-01-09: 40 mg via INTRAVENOUS
  Filled 2023-01-09: qty 10

## 2023-01-09 MED ORDER — ONDANSETRON HCL 4 MG PO TABS: 4.0000 mg | ORAL_TABLET | Freq: Every day | ORAL | 1 refills | Status: DC | PRN

## 2023-01-09 MED ORDER — ONDANSETRON HCL 4 MG/2ML IJ SOLN
4.0000 mg | Freq: Once | INTRAMUSCULAR | Status: AC
  Administered 2023-01-09: 4 mg via INTRAVENOUS
  Filled 2023-01-09: qty 2

## 2023-01-09 NOTE — ED Triage Notes (Signed)
Patient to ED for abd pain- RUQ. Hx of pancreatitis. States he has nausea as well.

## 2023-01-09 NOTE — Discharge Instructions (Signed)
Plenty of fluids to stay well-hydrated.  Take Tylenol 650 mg every 6 hours for pain.  If you have severe or breakthrough pain take oxycodone as prescribed.  Thank you for choosing [place] for your health care today!  Please see your primary doctor this week for a follow up appointment.   Sometimes, in the early stages of certain disease courses it is difficult to detect in the emergency department evaluation -- so, it is important that you continue to monitor your symptoms and call your doctor right away or return to the emergency department if you develop any new or worsening symptoms.  Please go to the following website to schedule new (and existing) patient appointments:   [email or web address]  If you do not have a primary doctor try calling the following clinics to establish care:  If you have insurance:  [facility] [phone] [place] [address]   [facility]  [phone] [place] [place] [address]   If you do not have insurance:  [facility]  [phone] [address], [place] [address]   The following is another list of primary care offices in the area who are accepting new patients at this time.  Please reach out to one of them directly and let them know you would like to schedule an appointment to follow up on an Emergency Department visit, and/or to establish a new primary care provider (PCP).  There are likely other primary care clinics in the are who are accepting new patients, but this is an excellent place to start:  [place] physician: Dr Lavon Paganini [address] [place], [place] [address] [phone]  [facility] Lead Physician: Dr Steele Sizer [address], [place], [place] [address] [phone]  [place] Physician: Dr Park Liter [address] [place], [place]  [address] [phone]  [facility] Lead Physician: Dr Dewaine Oats [place], [place], [place] [address] [phone]  [facility] [place] Physician: Dr Halina Maidens [address] [place], [place] [address] [phone]   It was my pleasure to care for you today.   Hoover Brunette Jacelyn Grip, MD

## 2023-01-09 NOTE — ED Triage Notes (Signed)
Pt presents ambulatory to triage via POV with complaints of RUQ pain. Hx of pancreatitis - seen here earlier for same but left wanting to do pain medication at home. He notes that his efforts were unsuccessful. A&Ox4 at this time. Denies CP or SOB.

## 2023-01-09 NOTE — ED Provider Notes (Signed)
[facility] Provider Note    Event Date/Time   First MD Initiated Contact with Patient 01/09/23 1501     (approximate)   History   Abdominal Pain   HPI  Bruce [name] is a 52 y.o. 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  He stopped drinking but he did go out for a party last night and had several alcoholic beverages which afterwards caused him to have epigastric pain reminiscent of his pancreatitis flares in the past.  It worsened this morning.  He has nausea but no vomiting.  Bowel movements have been normal no diarrhea melena or blood in the stools.  He started on a new medication that has made him urinate more frequently but denies dysuria fevers chills or lower abdominal pain.  No flank pain.  He denies chest pain or shortness of breath.  There is no radiation of his epigastric pain.    External Medical Documents Reviewed: Emergency department visit dated December 2022 with epigastric pain found to have elevated lipase, pancreatitis and admitted.      Physical Exam   Triage Vital Signs: ED Triage Vitals [01/09/23 1416]  Enc Vitals Group     BP (!) 148/100     Pulse Rate 81     Resp 18     Temp 98.1 F (36.7 C)     Temp Source Oral     SpO2 100 %     Weight      Height      Head Circumference      Peak Flow      Pain Score 7     Pain Loc      Pain Edu?      Excl. in [place]?     Most recent vital signs: Vitals:   01/09/23 1416  BP: (!) 148/100  Pulse: 81  Resp: 18  Temp: 98.1 F (36.7 C)  SpO2: 100%    General: Awake, alert and conversant CV:  Good peripheral perfusion.  Resp:  Normal effort.  Abd:  No distention.  Other:  Has some severe epigastric and right upper quadrant tenderness to palpation.  No CVA tenderness.  Lungs clear  peripheral pulses intact radial pulses equal   ED Results / Procedures / Treatments   Labs (all labs ordered are listed, but only abnormal results are displayed) Labs Reviewed  COMPREHENSIVE METABOLIC PANEL - Abnormal; Notable for the following components:      Result Value   Glucose, Bld 129 (*)    All other components within normal limits  LIPASE, BLOOD  CBC  URINALYSIS, ROUTINE W REFLEX MICROSCOPIC     I ordered and reviewed the above labs they are notable for normal LFTs and lipase    RADIOLOGY I independently reviewed and interpreted CT of the abdomen and pelvis and see no obvious obstructive or infectious processes   PROCEDURES:  Critical Care performed: No  Procedures   MEDICATIONS ORDERED IN ED: Medications  sodium chloride 0.9 % bolus 1,000 mL (1,000 mLs Intravenous New Bag/Given 01/09/23 1604)  ondansetron (ZOFRAN) injection 4 mg (4 mg Intravenous Given 01/09/23 1600)  morphine (PF) 4 MG/ML injection 4 mg (4 mg Intravenous Given 01/09/23 1600)  pantoprazole (PROTONIX) injection 40 mg (40 mg Intravenous Given 01/09/23 1601)  iohexol (OMNIPAQUE) 300 MG/ML solution 100 mL (100 mLs Intravenous  Contrast Given 01/09/23 1614)  morphine (PF) 4 MG/ML injection 4 mg (4 mg Intravenous Given 01/09/23 1717)     IMPRESSION / MDM / ASSESSMENT AND PLAN / ED COURSE  I reviewed the triage vital signs and the nursing notes.                                Patient's presentation is most consistent with acute presentation with potential threat to life or bodily function.  Differential diagnosis includes, but is not limited to, pancreatitis, cholecystitis or cholelithiasis, gastritis/GERD/ulcer, viscus perforation, intra-abdominal infection or obstruction, I considered but less likely cardiopulmonary emergencies like ACS, dissection, PE   MDM: Patient with chronic pancreatitis and now with similar symptoms induced by alcohol last night, however his labs are unremarkable with normal  lipase and LFTs.  Given his severe pain I will obtain a CT scan to assess for surgical emergencies of the abdomen.  I will give pain control with IV morphine, IV fluids, IV antiemetic as well as IV PPI.  He has no chest pain I do not think this is ACS given his point tenderness in the epigastrium only.  Lipase is normal but with chronic pancreatitis not unusual for acute pancreatitis, and CT confirms findings consistent with uncomplicated acute pancreatitis  I considered hospitalization for admission or observation however his pain is well-controlled in the emergency department and I offered observation/admission the patient declined wanting to trial discharge with close home monitoring and medications and he understands to return with any new or worsening pain.        FINAL CLINICAL IMPRESSION(S) / ED DIAGNOSES   Final diagnoses:  Alcohol-induced acute pancreatitis, unspecified complication status     Rx / DC Orders   ED Discharge Orders          Ordered    ondansetron (ZOFRAN) 4 MG tablet  Daily PRN        01/09/23 1703    oxyCODONE (ROXICODONE) 5 MG immediate release tablet  Every 8 hours PRN        01/09/23 1703             Note:  This document was prepared using Dragon voice recognition software and may include unintentional dictation errors.    Lucillie Garfinkel, MD 01/09/23 [phone]

## 2023-01-10 ENCOUNTER — Ambulatory Visit: Payer: Self-pay | Admitting: Infectious Diseases

## 2023-01-10 ENCOUNTER — Inpatient Hospital Stay
Admission: EM | Admit: 2023-01-10 | Discharge: 2023-01-12 | DRG: 440 | Disposition: A | Discharge status: Home / Self Care | Payer: Self-pay | Attending: Internal Medicine | Admitting: Internal Medicine

## 2023-01-10 DIAGNOSIS — R39198 Other difficulties with micturition: Secondary | ICD-10-CM | POA: Clinically undetermined

## 2023-01-10 DIAGNOSIS — K852 Alcohol induced acute pancreatitis without necrosis or infection: Principal | ICD-10-CM | POA: Diagnosis present

## 2023-01-10 DIAGNOSIS — E876 Hypokalemia: Secondary | ICD-10-CM | POA: Diagnosis present

## 2023-01-10 DIAGNOSIS — I480 Paroxysmal atrial fibrillation: Secondary | ICD-10-CM | POA: Diagnosis present

## 2023-01-10 DIAGNOSIS — I10 Essential (primary) hypertension: Secondary | ICD-10-CM | POA: Diagnosis present

## 2023-01-10 LAB — CBC WITH DIFFERENTIAL/PLATELET
Abs Immature Granulocytes: 0.01 10*3/uL (ref 0.00–0.07)
Basophils Absolute: 0 10*3/uL (ref 0.0–0.1)
Basophils Relative: 0 %
Eosinophils Absolute: 0.2 10*3/uL (ref 0.0–0.5)
Eosinophils Relative: 3 %
HCT: 45.4 % (ref 39.0–52.0)
Hemoglobin: 15.8 g/dL (ref 13.0–17.0)
Immature Granulocytes: 0 %
Lymphocytes Relative: 37 %
Lymphs Abs: 2.3 10*3/uL (ref 0.7–4.0)
MCH: 30.5 pg (ref 26.0–34.0)
MCHC: 34.8 g/dL (ref 30.0–36.0)
MCV: 87.6 fL (ref 80.0–100.0)
Monocytes Absolute: 0.5 10*3/uL (ref 0.1–1.0)
Monocytes Relative: 8 %
Neutro Abs: 3.3 10*3/uL (ref 1.7–7.7)
Neutrophils Relative %: 52 %
Platelets: 193 10*3/uL (ref 150–400)
RBC: 5.18 MIL/uL (ref 4.22–5.81)
RDW: 12.9 % (ref 11.5–15.5)
WBC: 6.2 10*3/uL (ref 4.0–10.5)
nRBC: 0 % (ref 0.0–0.2)

## 2023-01-10 LAB — CBC
HCT: 43.9 % (ref 39.0–52.0)
Hemoglobin: 15.1 g/dL (ref 13.0–17.0)
MCH: 30.2 pg (ref 26.0–34.0)
MCHC: 34.4 g/dL (ref 30.0–36.0)
MCV: 87.8 fL (ref 80.0–100.0)
Platelets: 184 10*3/uL (ref 150–400)
RBC: 5 MIL/uL (ref 4.22–5.81)
RDW: 13.1 % (ref 11.5–15.5)
WBC: 5.8 10*3/uL (ref 4.0–10.5)
nRBC: 0 % (ref 0.0–0.2)

## 2023-01-10 LAB — TROPONIN I (HIGH SENSITIVITY): Troponin I (High Sensitivity): 18 ng/L — ABNORMAL HIGH (ref <18)

## 2023-01-10 LAB — COMPREHENSIVE METABOLIC PANEL
ALT: 26 U/L (ref 0–44)
AST: 30 U/L (ref 15–41)
Albumin: 3.9 g/dL (ref 3.5–5.0)
Alkaline Phosphatase: 115 U/L (ref 38–126)
Anion gap: 9 (ref 5–15)
BUN: 10 mg/dL (ref 6–20)
CO2: 22 mmol/L (ref 22–32)
Calcium: 9.1 mg/dL (ref 8.9–10.3)
Chloride: 108 mmol/L (ref 98–111)
Creatinine, Ser: 0.8 mg/dL (ref 0.61–1.24)
GFR, Estimated: >60 mL/min (ref >60)
Glucose, Bld: 119 mg/dL — ABNORMAL HIGH (ref 70–99)
Potassium: 3.4 mmol/L — ABNORMAL LOW (ref 3.5–5.1)
Sodium: 139 mmol/L (ref 135–145)
Total Bilirubin: 0.7 mg/dL (ref 0.3–1.2)
Total Protein: 7.8 g/dL (ref 6.5–8.1)

## 2023-01-10 LAB — CREATININE, SERUM
Creatinine, Ser: 0.77 mg/dL (ref 0.61–1.24)
GFR, Estimated: >60 mL/min (ref >60)

## 2023-01-10 LAB — HIV ANTIBODY (ROUTINE TESTING W REFLEX): HIV Screen 4th Generation wRfx: NONREACTIVE

## 2023-01-10 LAB — LIPASE, BLOOD: Lipase: 123 U/L — ABNORMAL HIGH (ref 11–51)

## 2023-01-10 MED ORDER — DILTIAZEM HCL ER COATED BEADS 120 MG PO CP24
120.0000 mg | ORAL_CAPSULE | Freq: Every day | ORAL | Status: DC
  Administered 2023-01-10 – 2023-01-12 (×3): 120 mg via ORAL
  Filled 2023-01-10 (×3): qty 1

## 2023-01-10 MED ORDER — ONDANSETRON HCL 4 MG/2ML IJ SOLN
4.0000 mg | Freq: Four times a day (QID) | INTRAMUSCULAR | Status: DC | PRN
  Administered 2023-01-10 – 2023-01-11 (×3): 4 mg via INTRAVENOUS
  Filled 2023-01-10 (×3): qty 2

## 2023-01-10 MED ORDER — HYDROMORPHONE HCL 1 MG/ML IJ SOLN
1.0000 mg | Freq: Once | INTRAMUSCULAR | Status: AC
  Administered 2023-01-10: 1 mg via INTRAVENOUS
  Filled 2023-01-10: qty 1

## 2023-01-10 MED ORDER — ACETAMINOPHEN 325 MG PO TABS
650.0000 mg | ORAL_TABLET | Freq: Four times a day (QID) | ORAL | Status: DC | PRN
  Administered 2023-01-12: 650 mg via ORAL
  Filled 2023-01-10 (×2): qty 2

## 2023-01-10 MED ORDER — HYDROMORPHONE HCL 1 MG/ML IJ SOLN
1.0000 mg | INTRAMUSCULAR | Status: DC | PRN
  Administered 2023-01-10 – 2023-01-12 (×10): 1 mg via INTRAVENOUS
  Filled 2023-01-10 (×10): qty 1

## 2023-01-10 MED ORDER — ORAL CARE MOUTH RINSE: 15.0000 mL | OROMUCOSAL | Status: DC | PRN

## 2023-01-10 MED ORDER — ACETAMINOPHEN 650 MG RE SUPP: 650.0000 mg | Freq: Four times a day (QID) | RECTAL | Status: DC | PRN

## 2023-01-10 MED ORDER — MORPHINE SULFATE (PF) 2 MG/ML IV SOLN
2.0000 mg | INTRAVENOUS | Status: DC | PRN
  Administered 2023-01-10: 2 mg via INTRAVENOUS
  Filled 2023-01-10: qty 1

## 2023-01-10 MED ORDER — SODIUM CHLORIDE 0.9 % IV BOLUS (SEPSIS)
1000.0000 mL | Freq: Once | INTRAVENOUS | Status: AC
  Administered 2023-01-10: 1000 mL via INTRAVENOUS

## 2023-01-10 MED ORDER — TAMSULOSIN HCL 0.4 MG PO CAPS
0.4000 mg | ORAL_CAPSULE | Freq: Every day | ORAL | Status: DC
  Administered 2023-01-10 – 2023-01-12 (×3): 0.4 mg via ORAL
  Filled 2023-01-10 (×3): qty 1

## 2023-01-10 MED ORDER — SODIUM CHLORIDE 0.9 % IV SOLN
6.2500 mg | Freq: Four times a day (QID) | INTRAVENOUS | Status: DC | PRN
  Administered 2023-01-10: 6.25 mg via INTRAVENOUS
  Filled 2023-01-10 (×2): qty 0.25

## 2023-01-10 MED ORDER — SODIUM CHLORIDE 0.9 % IV SOLN: INTRAVENOUS | Status: DC

## 2023-01-10 MED ORDER — ONDANSETRON HCL 4 MG PO TABS: 4.0000 mg | ORAL_TABLET | Freq: Four times a day (QID) | ORAL | Status: DC | PRN

## 2023-01-10 MED ORDER — DIPHENHYDRAMINE HCL 25 MG PO CAPS
50.0000 mg | ORAL_CAPSULE | Freq: Once | ORAL | Status: AC
  Administered 2023-01-10: 50 mg via ORAL
  Filled 2023-01-10: qty 2

## 2023-01-10 MED ORDER — MORPHINE SULFATE (PF) 4 MG/ML IV SOLN
4.0000 mg | INTRAVENOUS | Status: DC | PRN
  Administered 2023-01-10: 4 mg via INTRAVENOUS
  Filled 2023-01-10: qty 1

## 2023-01-10 MED ORDER — OXYCODONE HCL 5 MG PO TABS: 5.0000 mg | ORAL_TABLET | Freq: Three times a day (TID) | ORAL | Status: DC | PRN

## 2023-01-10 MED ORDER — OXYCODONE HCL 5 MG PO TABS
5.0000 mg | ORAL_TABLET | ORAL | Status: DC | PRN
  Administered 2023-01-10 – 2023-01-12 (×3): 5 mg via ORAL
  Filled 2023-01-10 (×3): qty 1

## 2023-01-10 MED ORDER — ENOXAPARIN SODIUM 40 MG/0.4ML IJ SOSY
40.0000 mg | PREFILLED_SYRINGE | INTRAMUSCULAR | Status: DC
  Administered 2023-01-10 – 2023-01-12 (×3): 40 mg via SUBCUTANEOUS
  Filled 2023-01-10 (×3): qty 0.4

## 2023-01-10 MED ORDER — HYDROMORPHONE HCL 1 MG/ML IJ SOLN
0.5000 mg | INTRAMUSCULAR | Status: DC | PRN
  Administered 2023-01-10 (×2): 0.5 mg via INTRAVENOUS
  Filled 2023-01-10 (×2): qty 0.5

## 2023-01-10 MED ORDER — ONDANSETRON HCL 4 MG/2ML IJ SOLN
4.0000 mg | Freq: Once | INTRAMUSCULAR | Status: AC
  Administered 2023-01-10: 4 mg via INTRAVENOUS
  Filled 2023-01-10: qty 2

## 2023-01-10 NOTE — Plan of Care (Signed)

## 2023-01-10 NOTE — H&P (Signed)
History and Physical    Patient: Bruce [name] [id number] DOB: 02-Dec-1970 DOA: 01/10/2023 DOS: the patient was seen and examined on 01/10/2023 PCP: Langston Reusing, NP  Patient coming from: Home  Chief Complaint:  Chief Complaint  Patient presents with   Abdominal Pain    HPI: Bruce [name] is a 52 y.o. male with medical history significant for alcohol induced pancreatitis, paroxysmal atrial fibrillation (not on anticoagulation due to chads vas score of 0), and hepatitis C who presents to the ED for the second time in 24 hours with pain related to acute pancreatitis.  He has no nausea or vomiting.  When seen earlier he had a CT scan that showed acute pancreatitis without complicating factors and he was given oral pain medicine however he has been taking it without any relief.  Lipase on his first visit to the ED was 31 and is now up to 123.  Hospitalization requested due to uncontrolled pain.   Review of Systems: As mentioned in the history of present illness. All other systems reviewed and are negative.  Past Medical History:  Diagnosis Date   A-fib [facility])    Dental caries    Hepatitis C infection    Iritis    PAF (paroxysmal atrial fibrillation) ([place])    a. diagnosed 03/19/18; b. CHADS2VASc 0   Polysubstance abuse ([place])    a. cocaine, tobacco, etoh   Scrotal mass    Tick borne fever    Uveitis    Past Surgical History:  Procedure Laterality Date   COLONOSCOPY WITH PROPOFOL N/A 07/25/2022   Procedure: COLONOSCOPY WITH PROPOFOL;  Surgeon: Lin Landsman, MD;  Location: [facility] ENDOSCOPY;  Service: Gastroenterology;  Laterality: N/A;   FINGER SURGERY     infection, middle left   fractured jaw     SHOULDER SURGERY     right   Social History:  reports that he quit smoking about 13 months ago. His smoking use included cigarettes. He smoked an average of .25 packs per day. He has never used smokeless tobacco. He reports current alcohol use. He reports that he does  not currently use drugs after having used the following drugs: IV and Cocaine.  Allergies  Allergen Reactions   Penicillins Other (See Comments)    Did it involve swelling of the face/tongue/throat, SOB, or low BP? No Did it involve sudden or severe rash/hives, skin peeling, or any reaction on the inside of your mouth or nose? No Did you need to seek medical attention at a hospital or doctor's office? No When did it last happen?       If all above answers are "NO", may proceed with cephalosporin use.    Family History  Problem Relation Age of Onset   Schizophrenia Mother    Drug abuse Father        throat cancer   Other Maternal Grandmother        unknown medical history   Alzheimer's disease Maternal Grandfather    Cancer Paternal Grandmother    Other Paternal Grandfather        unknown medical history    Prior to Admission medications   Medication Sig Start Date End Date Taking? Authorizing Provider  diltiazem (CARDIZEM) 30 MG tablet Take 1 tablet (30 mg total) by mouth daily as needed. For elevated heart rates 10/13/22  Yes Furth, Cadence H, PA-C  diltiazem (CARTIA XT) 120 MG 24 hr capsule Take 1 capsule (120 mg total) by mouth once daily.  11/08/22  Yes Iloabachie, Chioma E, NP  tamsulosin (FLOMAX) 0.4 MG CAPS capsule Take 1 capsule (0.4 mg total) by mouth once daily after breakfast. 11/08/22  Yes Iloabachie, Chioma E, NP  Acetaminophen (TYLENOL PO) Take by mouth daily as needed.    [provider]  ondansetron (ZOFRAN) 4 MG tablet Take 1 tablet (4 mg total) by mouth daily as needed for nausea or vomiting. Patient not taking: Reported on 01/10/2023 01/09/23 01/09/24  Lucillie Garfinkel, MD  oxyCODONE (ROXICODONE) 5 MG immediate release tablet Take 1 tablet (5 mg total) by mouth every 8 (eight) hours as needed for up to 12 doses. 01/09/23   Lucillie Garfinkel, MD  polyethylene glycol (GOLYTELY) 236 g solution Take 4,000 mLs by mouth as directed. Patient not taking: Reported on 01/10/2023 07/25/22    Lin Landsman, MD    Physical Exam: Vitals:   01/09/23 2330 01/09/23 2338  BP: 129/86   Pulse: 77   Resp: 18   Temp: 98.3 F (36.8 C)   TempSrc: Oral   SpO2: 94%   Weight:  79.2 kg  Height:  '5\' 7"'$  (1.702 m)   Physical Exam Vitals and nursing note reviewed.  Constitutional:      General: He is not in acute distress. HENT:     Head: Normocephalic and atraumatic.  Cardiovascular:     Rate and Rhythm: Normal rate and regular rhythm.     Heart sounds: Normal heart sounds.  Pulmonary:     Effort: Pulmonary effort is normal.     Breath sounds: Normal breath sounds.  Abdominal:     Palpations: Abdomen is soft.     Tenderness: There is abdominal tenderness in the epigastric area and left upper quadrant.  Neurological:     Mental Status: Mental status is at baseline.     Labs on Admission: I have personally reviewed following labs and imaging studies  CBC: Recent Labs  Lab 01/09/23 1420 01/09/23 2339  WBC 4.2 6.2  NEUTROABS  --  3.3  HGB 15.8 15.8  HCT 45.2 45.4  MCV 85.9 87.6  PLT 218 0000000   Basic Metabolic Panel: Recent Labs  Lab 01/09/23 1420 01/09/23 2339  NA 139 139  K 3.6 3.4*  CL 107 108  CO2 23 22  GLUCOSE 129* 119*  BUN 13 10  CREATININE 0.82 0.80  CALCIUM 9.2 9.1   GFR: Estimated Creatinine Clearance: 99.8 mL/min (by C-G formula based on SCr of 0.8 mg/dL). Liver Function Tests: Recent Labs  Lab 01/09/23 1420 01/09/23 2339  AST 38 30  ALT 30 26  ALKPHOS 100 115  BILITOT 0.5 0.7  PROT 8.0 7.8  ALBUMIN 4.2 3.9   Recent Labs  Lab 01/09/23 1420 01/09/23 2339  LIPASE 31 123*   No results for input(s): "AMMONIA" in the last 168 hours. Coagulation Profile: No results for input(s): "INR", "PROTIME" in the last 168 hours. Cardiac Enzymes: No results for input(s): "CKTOTAL", "CKMB", "CKMBINDEX", "TROPONINI" in the last 168 hours. BNP (last 3 results) No results for input(s): "PROBNP" in the last 8760 hours. HbA1C: No results for  input(s): "HGBA1C" in the last 72 hours. CBG: No results for input(s): "GLUCAP" in the last 168 hours. Lipid Profile: No results for input(s): "CHOL", "HDL", "LDLCALC", "TRIG", "CHOLHDL", "LDLDIRECT" in the last 72 hours. Thyroid Function Tests: No results for input(s): "TSH", "T4TOTAL", "FREET4", "T3FREE", "THYROIDAB" in the last 72 hours. Anemia Panel: No results for input(s): "VITAMINB12", "FOLATE", "FERRITIN", "TIBC", "IRON", "RETICCTPCT" in the last 72 hours.  Urine analysis:    Component Value Date/Time   COLORURINE YELLOW (A) 01/09/2023 1710   APPEARANCEUR CLEAR (A) 01/09/2023 1710   LABSPEC 1.034 (H) 01/09/2023 1710   PHURINE 5.0 01/09/2023 1710   GLUCOSEU NEGATIVE 01/09/2023 1710   HGBUR NEGATIVE 01/09/2023 1710   BILIRUBINUR NEGATIVE 01/09/2023 1710   KETONESUR NEGATIVE 01/09/2023 1710   PROTEINUR NEGATIVE 01/09/2023 1710   UROBILINOGEN 1.0 05/01/2012 0835   NITRITE NEGATIVE 01/09/2023 1710   LEUKOCYTESUR NEGATIVE 01/09/2023 1710    Radiological Exams on Admission: CT Abdomen Pelvis W Contrast  Result Date: 01/09/2023 CLINICAL DATA:  Abdominal pain. EXAM: CT ABDOMEN AND PELVIS WITH CONTRAST TECHNIQUE: Multidetector CT imaging of the abdomen and pelvis was performed using the standard protocol following bolus administration of intravenous contrast. RADIATION DOSE REDUCTION: This exam was performed according to the departmental dose-optimization program which includes automated exposure control, adjustment of the mA and/or kV according to patient size and/or use of iterative reconstruction technique. CONTRAST:  173m OMNIPAQUE IOHEXOL 300 MG/ML  SOLN COMPARISON:  CT abdomen pelvis dated 10/13/2021. FINDINGS: Lower chest: There is lung bases are clear. No intra-abdominal free air or free fluid. Hepatobiliary: Fatty liver. No biliary dilatation. The gallbladder is unremarkable Pancreas: Mild inflammatory changes and stranding adjacent to the head and uncinate process of the pancreas  most consistent with acute pancreatitis. There is similar appearance of mild atrophy of the body and tail of the pancreas, likely sequela of chronic pancreatitis. No drainable fluid collection/abscess or pseudocyst. Spleen: Normal in size without focal abnormality. Adrenals/Urinary Tract: The adrenal glands unremarkable. There is no hydronephrosis on either side. There is symmetric enhancement and excretion of contrast by both kidneys. The visualized ureters and urinary bladder appear unremarkable. Stomach/Bowel: There is no bowel obstruction or active inflammation. The appendix is normal. Vascular/Lymphatic: The abdominal aorta and IVC are unremarkable. No portal venous gas. There is no adenopathy. Reproductive: The prostate and seminal vesicles are grossly unremarkable. No pelvic mass. Bilateral hydroceles noted. Other: None Musculoskeletal: No acute or significant osseous findings. IMPRESSION: 1. Acute pancreatitis. No drainable fluid collection/abscess or pseudocyst. 2. Fatty liver. 3. No bowel obstruction. Normal appendix. Electronically Signed   By: AAnner CreteM.D.   On: 01/09/2023 16:33     Data Reviewed: Relevant notes from primary care and specialist visits, past discharge summaries as available in EHR, including Care Everywhere. Prior diagnostic testing as pertinent to current admission diagnoses Updated medications and problem lists for reconciliation ED course, including vitals, labs, imaging, treatment and response to treatment Triage notes, nursing and pharmacy notes and ED provider's notes Notable results as noted in HPI   Assessment and Plan:   1.  Acute alcoholic pancreatitis. - The patient will be admitted to a medical bed. - He will be kept NPO. - Pain management will be provided. - We will follow serial lipase levels. - CT abdomen done earlier showing uncomplicated acute pancreatitis  2.  Alcohol abuse. - Counseled  3.  Essential hypertension. - We will continue  Cardizem CD.       DVT prophylaxis: Lovenox  Consults: none  Advance Care Planning:   Code Status: Prior   Family Communication: none  Disposition Plan: Back to previous home environment  Severity of Illness: The appropriate patient status for this patient is INPATIENT. Inpatient status is judged to be reasonable and necessary in order to provide the required intensity of service to ensure the patient's safety. The patient's presenting symptoms, physical exam findings, and initial radiographic and laboratory data  in the context of their chronic comorbidities is felt to place them at high risk for further clinical deterioration. Furthermore, it is not anticipated that the patient will be medically stable for discharge from the hospital within 2 midnights of admission.   * I certify that at the point of admission it is my clinical judgment that the patient will require inpatient hospital care spanning beyond 2 midnights from the point of admission due to high intensity of service, high risk for further deterioration and high frequency of surveillance required.*  Author: Athena Masse, MD 01/10/2023 4:17 AM  For on call review [email or web address].

## 2023-01-10 NOTE — Progress Notes (Signed)
  INTERVAL PROGRESS NOTE    Bruce [name]- 52 y.o. male  LOS: 0 __________________________________________________________________  SUBJECTIVE: Admitted 01/10/2023 with cc of  Chief Complaint  Patient presents with   Abdominal Pain  From alcohol induced pancreatitis.  Since admission, patient has remained stable with poorly controlled pain.  OBJECTIVE: Blood pressure 129/86, pulse 77, temperature 98.3 F (36.8 C), temperature source Oral, resp. rate 18, height '5\' 7"'$  (1.702 m), weight 79.2 kg, SpO2 94 %.  ASSESSMENT/PLAN:  I have reviewed the full H&P by Dr. Damita Dunnings, and I agree with the assessment and plan as outlined therein. In addition:  Pancreatitis-  - Continue IV fluids - added on diet - modified pain medications per patient's request  EtOH dependence - added CIWA monitoring  PAF- rate controlled on home medications  Principal Problem:   Alcohol-induced pancreatitis, recurrent Active Problems:   AF (paroxysmal atrial fibrillation) ([place])   Alcohol induced acute pancreatitis without necrosis or infection   Essential hypertension   Richarda Osmond, DO [facility] 01/10/2023, 8:03 AM    [email or web address] Available by Epic secure chat 7AM-7PM. If 7PM-7AM, please contact night-coverage   No Charge

## 2023-01-10 NOTE — ED Provider Notes (Signed)
[facility] Provider Note    Event Date/Time   First MD Initiated Contact with Patient 01/10/23 0240     (approximate)   History   Abdominal Pain   HPI  Bruce [name] is a 52 y.o. male with history of paroxysmal atrial fibrillation, polysubstance abuse, hepatitis C who presents to the emergency department with upper abdominal pain that radiates into his back with nausea without vomiting.  Seen here earlier today and had a CT scan that showed acute pancreatitis without complicating factors.  States he got his pain medication at the pharmacy and was taking it at home without relief.  He feels he needs admission to the hospital.  States he did drink alcohol 1 day ago which she thinks triggered his pain.   History provided by patient.    Past Medical History:  Diagnosis Date   A-fib [facility])    Dental caries    Hepatitis C infection    Iritis    PAF (paroxysmal atrial fibrillation) ([place])    a. diagnosed 03/19/18; b. CHADS2VASc 0   Polysubstance abuse ([place])    a. cocaine, tobacco, etoh   Scrotal mass    Tick borne fever    Uveitis     Past Surgical History:  Procedure Laterality Date   COLONOSCOPY WITH PROPOFOL N/A 07/25/2022   Procedure: COLONOSCOPY WITH PROPOFOL;  Surgeon: Lin Landsman, MD;  Location: [facility] ENDOSCOPY;  Service: Gastroenterology;  Laterality: N/A;   FINGER SURGERY     infection, middle left   fractured jaw     SHOULDER SURGERY     right    MEDICATIONS:  Prior to Admission medications   Medication Sig Start Date End Date Taking? Authorizing Provider  Acetaminophen (TYLENOL PO) Take by mouth daily as needed.    [provider]  diltiazem (CARDIZEM) 30 MG tablet Take 1 tablet (30 mg total) by mouth daily as needed. For elevated heart rates 10/13/22   Furth, Cadence H, PA-C  diltiazem (CARTIA XT) 120 MG 24 hr capsule Take 1 capsule (120 mg total) by mouth once daily. 11/08/22   Iloabachie, Chioma E, NP  ondansetron  (ZOFRAN) 4 MG tablet Take 1 tablet (4 mg total) by mouth daily as needed for nausea or vomiting. 01/09/23 01/09/24  Lucillie Garfinkel, MD  oxyCODONE (ROXICODONE) 5 MG immediate release tablet Take 1 tablet (5 mg total) by mouth every 8 (eight) hours as needed for up to 12 doses. 01/09/23   Lucillie Garfinkel, MD  polyethylene glycol (GOLYTELY) 236 g solution Take 4,000 mLs by mouth as directed. 07/25/22   Lin Landsman, MD  tamsulosin (FLOMAX) 0.4 MG CAPS capsule Take 1 capsule (0.4 mg total) by mouth once daily after breakfast. 11/08/22   Langston Reusing, NP    Physical Exam   Triage Vital Signs: ED Triage Vitals  Enc Vitals Group     BP 01/09/23 2330 129/86     Pulse Rate 01/09/23 2330 77     Resp 01/09/23 2330 18     Temp 01/09/23 2330 98.3 F (36.8 C)     Temp Source 01/09/23 2330 Oral     SpO2 01/09/23 2330 94 %     Weight 01/09/23 2338 174 lb 9.7 oz (79.2 kg)     Height 01/09/23 2338 '5\' 7"'$  (1.702 m)     Head Circumference --      Peak Flow --      Pain Score 01/09/23 2338 10  Pain Loc --      Pain Edu? --      Excl. in [place]? --     Most recent vital signs: Vitals:   01/09/23 2330  BP: 129/86  Pulse: 77  Resp: 18  Temp: 98.3 F (36.8 C)  SpO2: 94%    CONSTITUTIONAL: Alert, responds appropriately to questions.  Chronically ill-appearing, moaning in pain HEAD: Normocephalic, atraumatic EYES: Conjunctivae clear, pupils appear equal, sclera nonicteric ENT: normal nose; moist mucous membranes NECK: Supple, normal ROM CARD: RRR; S1 and S2 appreciated RESP: Normal chest excursion without splinting or tachypnea; breath sounds clear and equal bilaterally; no wheezes, no rhonchi, no rales, no hypoxia or respiratory distress, speaking full sentences ABD/GI: Non-distended; soft, tender in the epigastric region without guarding or rebound BACK: The back appears normal EXT: Normal ROM in all joints; no deformity noted, no edema SKIN: Normal color for age and race; warm; no rash on  exposed skin NEURO: Moves all extremities equally, normal speech PSYCH: The patient's mood and manner are appropriate.   ED Results / Procedures / Treatments   LABS: (all labs ordered are listed, but only abnormal results are displayed) Labs Reviewed  COMPREHENSIVE METABOLIC PANEL - Abnormal; Notable for the following components:      Result Value   Potassium 3.4 (*)    Glucose, Bld 119 (*)    All other components within normal limits  LIPASE, BLOOD - Abnormal; Notable for the following components:   Lipase 123 (*)    All other components within normal limits  TROPONIN I (HIGH SENSITIVITY) - Abnormal; Notable for the following components:   Troponin I (High Sensitivity) 18 (*)    All other components within normal limits  CBC WITH DIFFERENTIAL/PLATELET     EKG:  EKG Interpretation  Date/Time:  Monday January 09 2023 23:39:42 EST Ventricular Rate:  70 PR Interval:  164 QRS Duration: 76 QT Interval:  398 QTC Calculation: 429 R Axis:   58 Text Interpretation: Normal sinus rhythm Cannot rule out Anterior infarct , age undetermined Abnormal ECG When compared with ECG of 09-Jan-2023 16:43, (unconfirmed) No significant change was found Confirmed by Pryor Curia [id number]) on 01/10/2023 2:40:55 AM         RADIOLOGY: My personal review and interpretation of imaging: CT scan shows acute pancreatitis without necrosis, abscess, pseudocyst.  I have personally reviewed all radiology reports.   CT Abdomen Pelvis W Contrast  Result Date: 01/09/2023 CLINICAL DATA:  Abdominal pain. EXAM: CT ABDOMEN AND PELVIS WITH CONTRAST TECHNIQUE: Multidetector CT imaging of the abdomen and pelvis was performed using the standard protocol following bolus administration of intravenous contrast. RADIATION DOSE REDUCTION: This exam was performed according to the departmental dose-optimization program which includes automated exposure control, adjustment of the mA and/or kV according to patient size and/or use  of iterative reconstruction technique. CONTRAST:  13m OMNIPAQUE IOHEXOL 300 MG/ML  SOLN COMPARISON:  CT abdomen pelvis dated 10/13/2021. FINDINGS: Lower chest: There is lung bases are clear. No intra-abdominal free air or free fluid. Hepatobiliary: Fatty liver. No biliary dilatation. The gallbladder is unremarkable Pancreas: Mild inflammatory changes and stranding adjacent to the head and uncinate process of the pancreas most consistent with acute pancreatitis. There is similar appearance of mild atrophy of the body and tail of the pancreas, likely sequela of chronic pancreatitis. No drainable fluid collection/abscess or pseudocyst. Spleen: Normal in size without focal abnormality. Adrenals/Urinary Tract: The adrenal glands unremarkable. There is no hydronephrosis on either side. There is symmetric enhancement  and excretion of contrast by both kidneys. The visualized ureters and urinary bladder appear unremarkable. Stomach/Bowel: There is no bowel obstruction or active inflammation. The appendix is normal. Vascular/Lymphatic: The abdominal aorta and IVC are unremarkable. No portal venous gas. There is no adenopathy. Reproductive: The prostate and seminal vesicles are grossly unremarkable. No pelvic mass. Bilateral hydroceles noted. Other: None Musculoskeletal: No acute or significant osseous findings. IMPRESSION: 1. Acute pancreatitis. No drainable fluid collection/abscess or pseudocyst. 2. Fatty liver. 3. No bowel obstruction. Normal appendix. Electronically Signed   By: Anner Crete M.D.   On: 01/09/2023 16:33     PROCEDURES:  Critical Care performed: No      Procedures    IMPRESSION / MDM / ASSESSMENT AND PLAN / ED COURSE  I reviewed the triage vital signs and the nursing notes.    Patient here for upper abdominal pain consistent with previous episodes of alcohol induced pancreatitis.  The patient is on the cardiac monitor to evaluate for evidence of arrhythmia and/or significant heart  rate changes.   DIFFERENTIAL DIAGNOSIS (includes but not limited to):   Patient seen here earlier today and diagnosed with alcohol induced pancreatitis.  No signs of appendicitis.  Doubt cholelithiasis, cholecystitis, ACS.   Patient's presentation is most consistent with acute presentation with potential threat to life or bodily function.   PLAN: Repeat labs show no leukocytosis, normal LFTs.  Lipase not elevated to 123.  Troponins flat.  No chest pain.  EKG nonischemic.  Will give IV fluids, Dilaudid and Zofran.  Patient requesting admission.   MEDICATIONS GIVEN IN ED: Medications  sodium chloride 0.9 % bolus 1,000 mL (has no administration in time range)  0.9 %  sodium chloride infusion (has no administration in time range)  HYDROmorphone (DILAUDID) injection 1 mg (has no administration in time range)  ondansetron (ZOFRAN) injection 4 mg (has no administration in time range)     ED COURSE:  Consulted and discussed patient's case with hospitalist, Dr. Damita Dunnings.  I have recommended admission and consulting physician agrees and will place admission orders.  Patient (and family if present) agree with this plan.   I reviewed all nursing notes, vitals, pertinent previous records.  All labs, EKGs, imaging ordered have been independently reviewed and interpreted by myself.      OUTSIDE RECORDS REVIEWED: Reviewed patient's last admission in September 2023.       FINAL CLINICAL IMPRESSION(S) / ED DIAGNOSES   Final diagnoses:  Alcohol-induced acute pancreatitis without infection or necrosis     Rx / DC Orders   ED Discharge Orders     None        Note:  This document was prepared using Dragon voice recognition software and may include unintentional dictation errors.   Joua Bake, Delice Bison, DO 01/10/23 [phone]

## 2023-01-11 ENCOUNTER — Inpatient Hospital Stay: Payer: Self-pay

## 2023-01-11 DIAGNOSIS — I480 Paroxysmal atrial fibrillation: Secondary | ICD-10-CM

## 2023-01-11 DIAGNOSIS — R39198 Other difficulties with micturition: Secondary | ICD-10-CM

## 2023-01-11 HISTORY — DX: Other difficulties with micturition: R39.198

## 2023-01-11 LAB — COMPREHENSIVE METABOLIC PANEL
ALT: 21 U/L (ref 0–44)
AST: 31 U/L (ref 15–41)
Albumin: 3.8 g/dL (ref 3.5–5.0)
Alkaline Phosphatase: 93 U/L (ref 38–126)
Anion gap: 10 (ref 5–15)
BUN: 8 mg/dL (ref 6–20)
CO2: 22 mmol/L (ref 22–32)
Calcium: 8.8 mg/dL — ABNORMAL LOW (ref 8.9–10.3)
Chloride: 104 mmol/L (ref 98–111)
Creatinine, Ser: 0.82 mg/dL (ref 0.61–1.24)
GFR, Estimated: >60 mL/min (ref >60)
Glucose, Bld: 90 mg/dL (ref 70–99)
Potassium: 3.8 mmol/L (ref 3.5–5.1)
Sodium: 136 mmol/L (ref 135–145)
Total Bilirubin: 1.2 mg/dL (ref 0.3–1.2)
Total Protein: 7.6 g/dL (ref 6.5–8.1)

## 2023-01-11 LAB — CBC
HCT: 45.6 % (ref 39.0–52.0)
Hemoglobin: 15.4 g/dL (ref 13.0–17.0)
MCH: 29.6 pg (ref 26.0–34.0)
MCHC: 33.8 g/dL (ref 30.0–36.0)
MCV: 87.7 fL (ref 80.0–100.0)
Platelets: 172 10*3/uL (ref 150–400)
RBC: 5.2 MIL/uL (ref 4.22–5.81)
RDW: 13 % (ref 11.5–15.5)
WBC: 5.7 10*3/uL (ref 4.0–10.5)
nRBC: 0 % (ref 0.0–0.2)

## 2023-01-11 MED ORDER — POLYETHYLENE GLYCOL 3350 17 G PO PACK
17.0000 g | PACK | Freq: Once | ORAL | Status: AC
  Administered 2023-01-11: 17 g via ORAL
  Filled 2023-01-11: qty 1

## 2023-01-11 MED ORDER — DIPHENHYDRAMINE HCL 25 MG PO CAPS
25.0000 mg | ORAL_CAPSULE | Freq: Four times a day (QID) | ORAL | Status: DC | PRN
  Administered 2023-01-11: 25 mg via ORAL
  Filled 2023-01-11: qty 1

## 2023-01-11 NOTE — Assessment & Plan Note (Signed)
HR controlled. Continue home Cardizem.

## 2023-01-11 NOTE — Assessment & Plan Note (Addendum)
Continue IV fluids Pain control PRN Trial of advancing diet today Revert to clear liquids if food causes recurrent pain or N/V IV antiemetics PRN

## 2023-01-11 NOTE — Assessment & Plan Note (Signed)
Continue Cardizem. 

## 2023-01-11 NOTE — Progress Notes (Signed)
  Progress Note   Patient: Bruce [name] [id number] DOB: [birth year] DOA: 01/10/2023     1 DOS: the patient was seen and examined on 01/11/2023   Brief hospital course: HPI on admission, per Dr. Damita Dunnings: " Bruce [name] is a 52 y.o. male with medical history significant for alcohol induced pancreatitis, paroxysmal atrial fibrillation (not on anticoagulation due to chads vas score of 0), and hepatitis C who presents to the ED for the second time in 24 hours with pain related to acute pancreatitis.  He has no nausea or vomiting.  When seen earlier he had a CT scan that showed acute pancreatitis without complicating factors and he was given oral pain medicine however he has been taking it without any relief.  Lipase on his first visit to the ED was 31 and is now up to 123.  Hospitalization requested due to uncontrolled pain. "   3/6 -- improving, requesting trial of advanced diet.  Issues with difficulty voiding and left-sided abdominal pain with voiding attempts.  Assessment and Plan: * Alcohol-induced pancreatitis, recurrent .  Voiding difficulty Pt reports hx of BPH on Flomax, typically voids without issue.  Now reports feeling need to void, but cannot initiate urine stream, only able to void very small amounts. --Bladder scans to check PVR's --In/out cath in needed --Continue Flomax   Essential hypertension Continue Cardizem  Alcohol induced acute pancreatitis without necrosis or infection Continue IV fluids Pain control PRN Trial of advancing diet today Revert to clear liquids if food causes recurrent pain or N/V IV antiemetics PRN   AF (paroxysmal atrial fibrillation) (HCC) HR controlled. Continue home Cardizem.        Subjective: Pt seen this AM. Reports being hungry and wants to try eating food today.  Reports problems voiding, will stand with urinal in hand several minutes but can't initiate the stream.  When he tries to "push" or strain to void, he gets  left-sided abdominal pain.  No hematuria or hisotry of kidney stones, no dysuria.  Physical Exam: Vitals:   01/10/23 1207 01/10/23 2024 01/11/23 0519 01/11/23 0804  BP: (!) 159/93 (!) 114/97 115/73 124/67  Pulse: (!) 52 60 70 76  Resp: '20 18 17 18  '$ Temp: 98.1 F (36.7 C) 98.2 F (36.8 C) 98.2 F (36.8 C) 98.1 F (36.7 C)  TempSrc:      SpO2: 97% 100% 99% 96%  Weight:      Height:        General exam: awake, alert, no acute distress HEENT: atraumatic, clear conjunctiva, anicteric sclera, moist mucus membranes, hearing grossly normal  Respiratory system: CTAB, no wheezes, rales or rhonchi, normal respiratory effort. Cardiovascular system: normal S1/S2,  RRR, no JVD, murmurs, rubs, gallops,  no pedal edema.   Gastrointestinal system: epigastric and LUQ tenderness,+bowel sounds, not able to palpate spleen tip Central nervous system: A&O x4. no gross focal neurologic deficits, normal speech Extremities: moves all , no edema, normal tone Skin: dry, intact, normal temperature, normal color, No rashes, lesions or ulcers Psychiatry: normal mood, congruent affect, judgement and insight appear normal  Data Reviewed:  Notable labs --- Normal CMP except Ca 8.8, normal CBC  Family Communication: None  Disposition: Status is: Inpatient Remains inpatient appropriate because: remains on IV fluids until further improvement and tolerating adequate PO   Planned Discharge Destination: Home    Time spent: 42 minutes  Author: Ezekiel Slocumb, DO 01/11/2023 2:52 PM  For on call review [email or web address].

## 2023-01-11 NOTE — Assessment & Plan Note (Signed)
Pt reports hx of BPH on Flomax, typically voids without issue.  Now reports feeling need to void, but cannot initiate urine stream, only able to void very small amounts. --Bladder scans to check PVR's --In/out cath in needed --Continue Flomax

## 2023-01-11 NOTE — Hospital Course (Signed)
HPI on admission, per Dr. Damita Dunnings: " Bruce [name] is a 52 y.o. male with medical history significant for alcohol induced pancreatitis, paroxysmal atrial fibrillation (not on anticoagulation due to chads vas score of 0), and hepatitis C who presents to the ED for the second time in 24 hours with pain related to acute pancreatitis.  He has no nausea or vomiting.  When seen earlier he had a CT scan that showed acute pancreatitis without complicating factors and he was given oral pain medicine however he has been taking it without any relief.  Lipase on his first visit to the ED was 31 and is now up to 123.  Hospitalization requested due to uncontrolled pain. "   3/6 -- improving, requesting trial of advanced diet.  Issues with difficulty voiding and left-sided abdominal pain with voiding attempts.

## 2023-01-11 NOTE — Progress Notes (Signed)
       CROSS COVER NOTE  NAME: Bruce [name] MRN: [id number] DOB : 1971/08/17 ATTENDING PHYSICIAN: Ezekiel Slocumb, DO    Date of Service   01/11/2023   HPI/Events of Note   Report *** On Review of chart *** Bedside eval*** HPI***  Interventions   Assessment/Plan: Miralax x1 X X    *** professional thanks      To reach the provider On-Call:   7AM- 7PM see care teams to locate the attending and reach out to them via [email or web address] Password: [email or web address] 7PM-7AM contact night-coverage If you still have difficulty reaching the appropriate provider, please page the [facility] (Director on Call) for [facility] on amion for assistance  This document was prepared using Systems analyst and may include unintentional dictation errors.  Neomia Glass DNP, MBA, FNP-BC, PMHNP-BC Nurse Practitioner [facility] Pager [phone]

## 2023-01-12 ENCOUNTER — Encounter: Payer: Self-pay | Admitting: Internal Medicine

## 2023-01-12 ENCOUNTER — Other Ambulatory Visit: Payer: Self-pay

## 2023-01-12 LAB — BASIC METABOLIC PANEL
Anion gap: 9 (ref 5–15)
BUN: 8 mg/dL (ref 6–20)
CO2: 27 mmol/L (ref 22–32)
Calcium: 9.1 mg/dL (ref 8.9–10.3)
Chloride: 99 mmol/L (ref 98–111)
Creatinine, Ser: 0.78 mg/dL (ref 0.61–1.24)
GFR, Estimated: >60 mL/min (ref >60)
Glucose, Bld: 107 mg/dL — ABNORMAL HIGH (ref 70–99)
Potassium: 3.4 mmol/L — ABNORMAL LOW (ref 3.5–5.1)
Sodium: 135 mmol/L (ref 135–145)

## 2023-01-12 LAB — MAGNESIUM: Magnesium: 2 mg/dL (ref 1.7–2.4)

## 2023-01-12 MED ORDER — TRAMADOL HCL 50 MG PO TABS: 50.0000 mg | ORAL_TABLET | Freq: Four times a day (QID) | ORAL | 0 refills | Status: DC | PRN

## 2023-01-12 MED ORDER — POTASSIUM CHLORIDE CRYS ER 20 MEQ PO TBCR
40.0000 meq | EXTENDED_RELEASE_TABLET | Freq: Once | ORAL | Status: AC
  Administered 2023-01-12: 40 meq via ORAL
  Filled 2023-01-12: qty 2

## 2023-01-12 MED ORDER — SENNOSIDES-DOCUSATE SODIUM 8.6-50 MG PO TABS: 1.0000 | ORAL_TABLET | Freq: Two times a day (BID) | ORAL | Status: DC

## 2023-01-12 MED ORDER — ONDANSETRON 8 MG PO TBDP
8.0000 mg | ORAL_TABLET | Freq: Three times a day (TID) | ORAL | 0 refills | Status: DC | PRN
  Filled 2023-01-12: qty 20, 7d supply, fill #0

## 2023-01-12 MED ORDER — ONDANSETRON 8 MG PO TBDP: 8.0000 mg | ORAL_TABLET | Freq: Three times a day (TID) | ORAL | 0 refills | Status: DC | PRN

## 2023-01-12 MED ORDER — ACETAMINOPHEN 325 MG PO TABS: 650.0000 mg | ORAL_TABLET | Freq: Four times a day (QID) | ORAL | Status: AC | PRN

## 2023-01-12 MED ORDER — BISACODYL 5 MG PO TBEC: 5.0000 mg | DELAYED_RELEASE_TABLET | Freq: Once | ORAL | Status: DC

## 2023-01-12 NOTE — TOC CM/SW Note (Addendum)
  Transition of Care [facility]) Screening Note   Patient Details  Name: Bruce [name] Date of Birth: [birth year]   Transition of Care [facility]) CM/SW Contact:    Candie Chroman, LCSW Phone Number: 01/12/2023, 9:28 AM    Transition of Care Department [facility]) has reviewed patient and no TOC needs have been identified at this time. We will continue to monitor patient advancement through interdisciplinary progression rounds. If new patient transition needs arise, please place a TOC consult.  11:22 am: Patient has orders to discharge home today. GoodRx coupon for Zofran placed on chart. CSW signing off.  Dayton Scrape, [place]

## 2023-01-12 NOTE — Assessment & Plan Note (Signed)
K 3.4 this AM. Replacing with 40 mEq PO K-Cl. Monitor BMP, replace K as needed.

## 2023-01-12 NOTE — Discharge Summary (Signed)
Physician Discharge Summary   Patient: Bruce [name] MRN: [id number] DOB: 06-27-1971  Admit date:     01/10/2023  Discharge date: 01/12/23  Discharge Physician: Ezekiel Slocumb   PCP: Langston Reusing, NP   Recommendations at discharge:   Follow up with Primary Care in 1-2 weeks Repeat CMP, CBC in 1-2 weeks   Discharge Diagnoses: Principal Problem:   Alcohol induced acute pancreatitis without necrosis or infection Active Problems:   Alcohol-induced pancreatitis, recurrent   AF (paroxysmal atrial fibrillation) (HCC)   Hypokalemia   Essential hypertension  Resolved Problems:   Voiding difficulty  Hospital Course: HPI on admission, per Dr. Damita Dunnings: " Bruce [name] is a 52 y.o. male with medical history significant for alcohol induced pancreatitis, paroxysmal atrial fibrillation (not on anticoagulation due to chads vas score of 0), and hepatitis C who presents to the ED for the second time in 24 hours with pain related to acute pancreatitis.  He has no nausea or vomiting.  When seen earlier he had a CT scan that showed acute pancreatitis without complicating factors and he was given oral pain medicine however he has been taking it without any relief.  Lipase on his first visit to the ED was 31 and is now up to 123.  Hospitalization requested due to uncontrolled pain. "   3/6 -- improving, requesting trial of advanced diet.  Issues with difficulty voiding and left-sided abdominal pain with voiding attempts.  3/7 -- tolerating diet and requests discharge today.  Stool softeners for constipation.   Assessment and Plan: * Alcohol induced acute pancreatitis without necrosis or infection Continue IV fluids Pain control PRN 3/6 -- Trial of advancing diet  3/7 -- tolerating diet well, no N/V or increased epigastric pain Stable to discharge Prescriptions for Zofran ODT's and Tramadol PCP follow up in 1-2 weeks Avoidance of alcohol was stressed to patient to prevent  recurrence and development of chronic pancreatitis.  Alcohol-induced pancreatitis, recurrent .Marland Kitchen  Essential hypertension Continue Cardizem  Hypokalemia K 3.4 this AM. Replacing with 40 mEq PO K-Cl. Monitor BMP, replace K as needed.  AF (paroxysmal atrial fibrillation) (HCC) HR controlled. Continue home Cardizem.  Voiding difficulty-resolved as of 01/12/2023 Pt reports hx of BPH on Flomax, typically voids without issue.  Now reports feeling need to void, but cannot initiate urine stream, only able to void very small amounts. --Bladder scans to check PVR's --In/out cath in needed --Continue Flomax          Consultants: None Procedures performed: none  Disposition: Home Diet recommendation:  Discharge Diet Orders (From admission, onward)     Start     Ordered   01/12/23 0000  Diet - low sodium heart healthy        01/12/23 1057           Regular diet DISCHARGE MEDICATION: Allergies as of 01/12/2023       Reactions   Penicillins Other (See Comments)   Did it involve swelling of the face/tongue/throat, SOB, or low BP? No Did it involve sudden or severe rash/hives, skin peeling, or any reaction on the inside of your mouth or nose? No Did you need to seek medical attention at a hospital or doctor's office? No When did it last happen?       If all above answers are "NO", may proceed with cephalosporin use.        Medication List     STOP taking these medications    ondansetron 4  MG tablet Commonly known as: Zofran   PEG-3350/Electrolytes 236 g Solr       TAKE these medications    acetaminophen 325 MG tablet Commonly known as: Tylenol Take 2 tablets (650 mg total) by mouth every 6 (six) hours as needed for mild pain, headache or fever. What changed:  medication strength how much to take when to take this reasons to take this   diltiazem 120 MG 24 hr capsule Commonly known as: Cartia XT Take 1 capsule (120 mg total) by mouth once daily.    diltiazem 30 MG tablet Commonly known as: Cardizem Take 1 tablet (30 mg total) by mouth daily as needed. For elevated heart rates   ondansetron 8 MG disintegrating tablet Commonly known as: ZOFRAN-ODT Take 1 tablet (8 mg total) by mouth every 8 (eight) hours as needed for nausea or vomiting.   oxyCODONE 5 MG immediate release tablet Commonly known as: Roxicodone Take 1 tablet (5 mg total) by mouth every 8 (eight) hours as needed for up to 12 doses.   senna-docusate 8.6-50 MG tablet Commonly known as: Senokot-S Take 1 tablet by mouth 2 (two) times daily.   tamsulosin 0.4 MG Caps capsule Commonly known as: FLOMAX Take 1 capsule (0.4 mg total) by mouth once daily after breakfast.   traMADol 50 MG tablet Commonly known as: Ultram Take 1-2 tablets (50-100 mg total) by mouth every 6 (six) hours as needed.        Discharge Exam: Filed Weights   01/09/23 2338  Weight: 79.2 kg   General exam: awake, alert, no acute distress HEENT: atraumatic, clear conjunctiva, anicteric sclera, moist mucus membranes, hearing grossly normal  Respiratory system: CTAB, no wheezes, rales or rhonchi, normal respiratory effort. Cardiovascular system: normal S1/S2, RRR, no JVD, murmurs, rubs, gallops, no pedal edema.   Gastrointestinal system: distended, non-tender, +bowel sounds. Central nervous system: A&O x 4. no gross focal neurologic deficits, normal speech Extremities: moves all , no edema, normal tone Skin: dry, intact, normal temperature, normal color, No rashes, lesions or ulcers Psychiatry: normal mood, congruent affect, judgement and insight appear normal   Condition at discharge: stable  The results of significant diagnostics from this hospitalization (including imaging, microbiology, ancillary and laboratory) are listed below for reference.   Imaging Studies: DG Abd 1 View  Result Date: 01/11/2023 CLINICAL DATA:  Abdominal pain. EXAM: ABDOMEN - 1 VIEW COMPARISON:  CT 01/09/2023, 2  days ago FINDINGS: Slight increased air within nondilated small bowel centrally. Small volume of formed stool in the ascending colon. Calcifications in the pelvis correspond to phleboliths on recent CT. No radiopaque calculi. The lung bases are clear. No acute osseous abnormalities are seen. IMPRESSION: Slight increased air within nondilated small bowel centrally, may represent enteritis or ileus. Electronically Signed   By: Keith Rake M.D.   On: 01/11/2023 15:41   CT Abdomen Pelvis W Contrast  Result Date: 01/09/2023 CLINICAL DATA:  Abdominal pain. EXAM: CT ABDOMEN AND PELVIS WITH CONTRAST TECHNIQUE: Multidetector CT imaging of the abdomen and pelvis was performed using the standard protocol following bolus administration of intravenous contrast. RADIATION DOSE REDUCTION: This exam was performed according to the departmental dose-optimization program which includes automated exposure control, adjustment of the mA and/or kV according to patient size and/or use of iterative reconstruction technique. CONTRAST:  136m OMNIPAQUE IOHEXOL 300 MG/ML  SOLN COMPARISON:  CT abdomen pelvis dated 10/13/2021. FINDINGS: Lower chest: There is lung bases are clear. No intra-abdominal free air or free fluid. Hepatobiliary: Fatty liver. No  biliary dilatation. The gallbladder is unremarkable Pancreas: Mild inflammatory changes and stranding adjacent to the head and uncinate process of the pancreas most consistent with acute pancreatitis. There is similar appearance of mild atrophy of the body and tail of the pancreas, likely sequela of chronic pancreatitis. No drainable fluid collection/abscess or pseudocyst. Spleen: Normal in size without focal abnormality. Adrenals/Urinary Tract: The adrenal glands unremarkable. There is no hydronephrosis on either side. There is symmetric enhancement and excretion of contrast by both kidneys. The visualized ureters and urinary bladder appear unremarkable. Stomach/Bowel: There is no bowel  obstruction or active inflammation. The appendix is normal. Vascular/Lymphatic: The abdominal aorta and IVC are unremarkable. No portal venous gas. There is no adenopathy. Reproductive: The prostate and seminal vesicles are grossly unremarkable. No pelvic mass. Bilateral hydroceles noted. Other: None Musculoskeletal: No acute or significant osseous findings. IMPRESSION: 1. Acute pancreatitis. No drainable fluid collection/abscess or pseudocyst. 2. Fatty liver. 3. No bowel obstruction. Normal appendix. Electronically Signed   By: Anner Crete M.D.   On: 01/09/2023 16:33    Microbiology: Results for orders placed or performed during the hospital encounter of 10/13/21  Resp Panel by RT-PCR (Flu A&B, Covid) Nasopharyngeal Swab     Status: None   Collection Time: 10/13/21  1:27 AM   Specimen: Nasopharyngeal Swab; Nasopharyngeal(NP) swabs in vial transport medium  Result Value Ref Range Status   SARS Coronavirus 2 by RT PCR NEGATIVE NEGATIVE Final    Comment: (NOTE) SARS-CoV-2 target nucleic acids are NOT DETECTED.  The SARS-CoV-2 RNA is generally detectable in upper respiratory specimens during the acute phase of infection. The lowest concentration of SARS-CoV-2 viral copies this assay can detect is 138 copies/mL. A negative result does not preclude SARS-Cov-2 infection and should not be used as the sole basis for treatment or other patient management decisions. A negative result may occur with  improper specimen collection/handling, submission of specimen other than nasopharyngeal swab, presence of viral mutation(s) within the areas targeted by this assay, and inadequate number of viral copies(<138 copies/mL). A negative result must be combined with clinical observations, patient history, and epidemiological information. The expected result is Negative.  Fact Sheet for Patients:  EntrepreneurPulse.com.au  Fact Sheet for Healthcare Providers:   IncredibleEmployment.be  This test is no t yet approved or cleared by the [place] FDA and  has been authorized for detection and/or diagnosis of SARS-CoV-2 by FDA under an Emergency Use Authorization (EUA). This EUA will remain  in effect (meaning this test can be used) for the duration of the COVID-19 declaration under Section 564(b)(1) of the Act, 21 U.S.C.section 360bbb-3(b)(1), unless the authorization is terminated  or revoked sooner.       Influenza A by PCR NEGATIVE NEGATIVE Final   Influenza B by PCR NEGATIVE NEGATIVE Final    Comment: (NOTE) The Xpert Xpress SARS-CoV-2/FLU/RSV plus assay is intended as an aid in the diagnosis of influenza from Nasopharyngeal swab specimens and should not be used as a sole basis for treatment. Nasal washings and aspirates are unacceptable for Xpert Xpress SARS-CoV-2/FLU/RSV testing.  Fact Sheet for Patients: EntrepreneurPulse.com.au  Fact Sheet for Healthcare Providers: IncredibleEmployment.be  This test is not yet approved or cleared by the [place] FDA and has been authorized for detection and/or diagnosis of SARS-CoV-2 by FDA under an Emergency Use Authorization (EUA). This EUA will remain in effect (meaning this test can be used) for the duration of the COVID-19 declaration under Section 564(b)(1) of the Act, 21 U.S.C. section 360bbb-3(b)(1), unless  the authorization is terminated or revoked.  Performed at [facility], [address] [place], [place] [address]     Labs: CBC: Recent Labs  Lab 01/09/23 1420 01/09/23 2339 01/10/23 0949 01/11/23 0515  WBC 4.2 6.2 5.8 5.7  NEUTROABS  --  3.3  --   --   HGB 15.8 15.8 15.1 15.4  HCT 45.2 45.4 43.9 45.6  MCV 85.9 87.6 87.8 87.7  PLT 218 193 184 [id number]   Basic Metabolic Panel: Recent Labs  Lab 01/09/23 1420 01/09/23 2339 01/10/23 0949 01/11/23 0515 01/12/23 0535  NA 139 139  --  136 135  K 3.6  3.4*  --  3.8 3.4*  CL 107 108  --  104 99  CO2 23 22  --  22 27  GLUCOSE 129* 119*  --  90 107*  BUN 13 10  --  8 8  CREATININE 0.82 0.80 0.77 0.82 0.78  CALCIUM 9.2 9.1  --  8.8* 9.1  MG  --   --   --   --  2.0   Liver Function Tests: Recent Labs  Lab 01/09/23 1420 01/09/23 2339 01/11/23 0515  AST 38 30 31  ALT '30 26 21  '$ ALKPHOS 100 115 93  BILITOT 0.5 0.7 1.2  PROT 8.0 7.8 7.6  ALBUMIN 4.2 3.9 3.8   CBG: No results for input(s): "GLUCAP" in the last 168 hours.  Discharge time spent: less than 30 minutes.  Signed: Ezekiel Slocumb, DO [facility] 01/12/2023

## 2023-01-18 ENCOUNTER — Other Ambulatory Visit: Payer: Self-pay

## 2023-01-19 ENCOUNTER — Other Ambulatory Visit: Payer: Self-pay

## 2023-01-20 ENCOUNTER — Other Ambulatory Visit: Payer: Self-pay

## 2023-01-20 MED ORDER — ONDANSETRON 8 MG PO TBDP
8.0000 mg | ORAL_TABLET | Freq: Three times a day (TID) | ORAL | 0 refills | Status: DC | PRN
  Filled 2023-01-20 – 2023-04-14 (×2): qty 20, 7d supply, fill #0

## 2023-01-25 ENCOUNTER — Other Ambulatory Visit: Payer: Self-pay

## 2023-02-01 ENCOUNTER — Ambulatory Visit: Payer: Self-pay | Admitting: Gerontology

## 2023-02-02 ENCOUNTER — Ambulatory Visit: Payer: Self-pay | Admitting: Gerontology

## 2023-02-02 ENCOUNTER — Other Ambulatory Visit: Payer: Self-pay

## 2023-02-02 VITALS — BP 117/79 | HR 65 | Temp 97.6°F | Resp 16 | Ht 67.0 in | Wt 176.5 lb

## 2023-02-02 DIAGNOSIS — I480 Paroxysmal atrial fibrillation: Secondary | ICD-10-CM

## 2023-02-02 DIAGNOSIS — N401 Enlarged prostate with lower urinary tract symptoms: Secondary | ICD-10-CM

## 2023-02-02 DIAGNOSIS — Z87438 Personal history of other diseases of male genital organs: Secondary | ICD-10-CM

## 2023-02-02 DIAGNOSIS — Z09 Encounter for follow-up examination after completed treatment for conditions other than malignant neoplasm: Secondary | ICD-10-CM

## 2023-02-02 DIAGNOSIS — K852 Alcohol induced acute pancreatitis without necrosis or infection: Secondary | ICD-10-CM

## 2023-02-02 DIAGNOSIS — R7303 Prediabetes: Secondary | ICD-10-CM

## 2023-02-02 DIAGNOSIS — I10 Essential (primary) hypertension: Secondary | ICD-10-CM

## 2023-02-02 MED ORDER — DILTIAZEM HCL ER COATED BEADS 120 MG PO CP24
120.0000 mg | ORAL_CAPSULE | Freq: Every day | ORAL | 2 refills | Status: DC
  Filled 2023-02-02: qty 30, 30d supply, fill #0
  Filled 2023-03-14: qty 30, 30d supply, fill #1
  Filled 2023-04-14: qty 30, 30d supply, fill #2

## 2023-02-02 MED ORDER — TAMSULOSIN HCL 0.4 MG PO CAPS
0.4000 mg | ORAL_CAPSULE | Freq: Every day | ORAL | 2 refills | Status: DC
  Filled 2023-02-02 – 2023-02-23 (×2): qty 30, 30d supply, fill #0
  Filled 2023-04-14: qty 30, 30d supply, fill #1

## 2023-02-02 NOTE — Progress Notes (Signed)
Established Patient Office Visit  Subjective   Patient ID: MITESH MATTERN, male    DOB: [birth year]  Age: 52 y.o. MRN: [id number]  Chief Complaint  Patient presents with   Hospitalization Follow-up    Admitted to [facility] 01/10/23 for alcohol induced pancreatitis    HPI Jacquan Viars is a 52 y/o male with a PMH of afib (not on anticoag due to chas vas score of 0), hypertension, BPH and prediabetes. He presents today for a hospital follow up. He was hospitalized 01/10/23 - 01/12/23 for uncontrolled pain due to acute pancreatitis. His symptoms resolved with fluids, pain medication, and antiemetics. Upon discharge, his potassium was low at 3.4 mmol/L. He was given 1 dose of PO K-Cl 40 mEq and is here for repeat labs.   He reports feeling well in general, denies abdominal pain, nausea/vomiting, constipation and diarrhea. He is eating well, trying to follow DASH/low carb and low fat dietary guidelines, but is craving more sweets and sodas. His last A1C was 6.1% on 11/08/22. He has declined metformin for prediabetes in the past.  He no longer tracks his blood pressure at home, stating his monitor broke last year. He reports headaches 2-3 days/week and feels it is related to high blood pressure or agitation. He and his girlfriend are taking care of her 3 grandsons (ages 22, 63, 42) and states that it has been stressful at home lately.  He reports taking acetaminophen 325mg , 2 tabs 1-2 times per day for headaches, only on days when he has pain.  He reports 1 episode of heart palpitations/chest pain once, about 1 week ago. It resolved quickly with rest. He denied jaw pain, N/V, left arm pain, SOB during this episode. Otherwise, feels it has been several months since he experienced any palpitations or chest pain. He states he hasn't needed PRN dose cardizem in years. He does not get formal exercise, but stays physically active at work and at his home.   He reports continued urinary frequency, retention but without  dysuria. He gets up frequently during the night to urinate, feels he has to go every 1/2 - 1 hour during the day. He states flomax helps his symptoms, asks for refills.   He reports difficulty reading small print, stating it is blurry. He has tried various strengths of reading glasses from the dollar store. His vision issues started "years ago" but he has never had an eye exam. He is interested in the vision screening at [facility].  He denies any side effects from his medications and takes them as prescribed. He states that he will follow up at the Infectious disease clinic on 02/17/23 for SVC after completing his treatment for Chronic Hepatitis C. He states that he will schedule a follow up appointment with Cardiology for Afib and Urology for history of BPH. Overall, he states that he's doing well and offers no further complain.   Review of Systems  Constitutional:  Negative for chills, diaphoresis, fever and malaise/fatigue.  Eyes:  Positive for blurred vision.  Respiratory:  Negative for cough and shortness of breath.   Cardiovascular:  Negative for chest pain and palpitations.  Gastrointestinal:  Negative for abdominal pain, diarrhea, nausea and vomiting.  Genitourinary:  Positive for frequency and urgency. Negative for dysuria, flank pain and hematuria.  Neurological:  Positive for headaches. Negative for dizziness, sensory change, speech change, focal weakness and weakness.  Psychiatric/Behavioral:  Positive for substance abuse ("haven't drank alcohol since the hospital"). Negative for depression.  Objective:     BP 117/79 (BP Location: Right Arm, Patient Position: Sitting, Cuff Size: Large)   Pulse 65   Temp 97.6 F (36.4 C) (Oral)   Resp 16   Ht 5\' 7"  (1.702 m)   Wt 176 lb 8 oz (80.1 kg)   SpO2 96%   BMI 27.64 kg/m  BP Readings from Last 3 Encounters:  02/02/23 117/79  01/12/23 (!) 134/95  01/09/23 (!) 130/90   Wt Readings from Last 3 Encounters:  02/02/23 176 lb 8 oz  (80.1 kg)  01/09/23 174 lb 9.7 oz (79.2 kg)  01/09/23 174 lb 6.1 oz (79.1 kg)      Physical Exam Vitals and nursing note reviewed. Exam conducted with a chaperone present.  Constitutional:      Appearance: Normal appearance.  Eyes:     General: No scleral icterus.    Conjunctiva/sclera: Conjunctivae normal.  Cardiovascular:     Rate and Rhythm: Normal rate and regular rhythm.     Pulses: Normal pulses.     Heart sounds: Normal heart sounds. No murmur heard.    No gallop.  Pulmonary:     Effort: Pulmonary effort is normal. No respiratory distress.     Breath sounds: Normal breath sounds. No wheezing or rhonchi.  Abdominal:     General: Bowel sounds are normal. There is no distension.     Palpations: Abdomen is soft.     Tenderness: There is no abdominal tenderness. There is no guarding.  Genitourinary:    Comments: deferred Musculoskeletal:     Right lower leg: No edema.     Left lower leg: No edema.  Skin:    General: Skin is warm.     Capillary Refill: Capillary refill takes less than 2 seconds.     Coloration: Skin is not jaundiced.  Neurological:     General: No focal deficit present.     Mental Status: He is alert and oriented to person, place, and time.  Psychiatric:        Mood and Affect: Mood normal.        Behavior: Behavior normal.        Thought Content: Thought content normal.        Judgment: Judgment normal.      No results found for any visits on 02/02/23.  Last CBC Lab Results  Component Value Date   WBC 5.7 01/11/2023   HGB 15.4 01/11/2023   HCT 45.6 01/11/2023   MCV 87.7 01/11/2023   MCH 29.6 01/11/2023   RDW 13.0 01/11/2023   PLT 172 [id number]   Last metabolic panel Lab Results  Component Value Date   GLUCOSE 107 (H) 01/12/2023   NA 135 01/12/2023   K 3.4 (L) 01/12/2023   CL 99 01/12/2023   CO2 27 01/12/2023   BUN 8 01/12/2023   CREATININE 0.78 01/12/2023   GFRNONAA >60 01/12/2023   CALCIUM 9.1 01/12/2023   PHOS 3.2 10/18/2021    PROT 7.6 01/11/2023   ALBUMIN 3.8 01/11/2023   LABGLOB 3.4 05/18/2022   AGRATIO 1.3 05/18/2022   BILITOT 1.2 01/11/2023   ALKPHOS 93 01/11/2023   AST 31 01/11/2023   ALT 21 01/11/2023   ANIONGAP 9 01/12/2023   Last hemoglobin A1c Lab Results  Component Value Date   HGBA1C 6.1 (A) 11/08/2022      The 10-year ASCVD risk score (Arnett DK, et al., 2019) is: 7.6%    Assessment & Plan:  1. Paroxysmal atrial fibrillation ([place]) - CHA2DS2/VAS Stroke  Risk Points  Current as of 5 minutes ago     1 >= 2 Points: High Risk  1 - 1.99 Points: Medium Risk  0 Points: Low Risk    Last Change: N/A      Details    This score determines the patient's risk of having a stroke if the  patient has atrial fibrillation.       Points Metrics  0 Has Congestive Heart Failure:  No    Current as of 5 minutes ago  0 Has Vascular Disease:  No    Current as of 5 minutes ago  1 Has Hypertension:  Yes    Current as of 5 minutes ago  0 Age:  30    Current as of 5 minutes ago  0 Has Diabetes:  No    Current as of 5 minutes ago  0 Had Stroke:  No  Had TIA:  No  Had Thromboembolism:  No    Current as of 5 minutes ago  0 Male:  No    Current as of 5 minutes ago   - encouraged patient to follow up with cardiology.  - asked him to bring in his BP monitor for replacement; he can do this any time the clinic is open. - discussed s/s of stroke/heart attack and to go to the ER.   - diltiazem (CARTIA XT) 120 MG 24 hr capsule; Take 1 capsule (120 mg total) by mouth once daily.  Dispense: 30 capsule; Refill: 2  2. Alcohol-induced acute pancreatitis, unspecified complication status - encouraged him to avoid alcohol. [facility] is able to connect him to services if he needs or wants help. - encouraged low fat diet. - appears resolved.   3. Prediabetes - will check A1C at next visit. - encouraged low carb/concentrated sweets dietary modification.  4. Hospital discharge follow-up - appears resolved. Will  check labs, call with results and any changes necessary. - encouraged alcohol cessation. - Comp Met (CMET); Future - CBC; Future - CBC - Comp Met (CMET)  5. Essential (primary) hypertension - The 10-year ASCVD risk score (Arnett DK, et al., 2019) is: 7.6%   Values used to calculate the score:     Age: 31 years     Sex: Male     Is Non-Hispanic African American: Yes     Diabetic: No     Tobacco smoker: No     Systolic Blood Pressure: 123XX123 mmHg     Is BP treated: Yes     HDL Cholesterol: 53 mg/dL     Total Cholesterol: 145 mg/dL - continue to follow low fat/cholesterol diet guidelines, switch his BP monitor at the clinic, and keep BP log with goal >130/80, especially when he feels headaches. - continue medications as prescribed.2  6. History of BPH refilled flomax. - encouraged follow up with urology. - UA/M w/rflx Culture, Routine; Future - UA/M w/rflx Culture, Routine - tamsulosin (FLOMAX) 0.4 MG CAPS capsule; Take 1 capsule (0.4 mg total) by mouth once daily after breakfast.  Dispense: 30 capsule; Refill: 2   Return in about 6 weeks (around 03/16/2023), or if symptoms worsen or fail to improve.    Harvin Hazel, RN

## 2023-02-02 NOTE — Patient Instructions (Addendum)
Carbohydrate Counting for Diabetes Mellitus, Adult Carbohydrate counting is a method of keeping track of how many carbohydrates you eat. Eating carbohydrates increases the amount of sugar (glucose) in the blood. Counting how many carbohydrates you eat improves how well you manage your blood glucose. This, in turn, helps you manage your diabetes. Carbohydrates are measured in grams (g) per serving. It is important to know how many carbohydrates (in grams or by serving size) you can have in each meal. This is different for every person. A dietitian can help you make a meal plan and calculate how many carbohydrates you should have at each meal and snack. What foods contain carbohydrates? Carbohydrates are found in the following foods: Grains, such as breads and cereals. Dried beans and soy products. Starchy vegetables, such as potatoes, peas, and corn. Fruit and fruit juices. Milk and yogurt. Sweets and snack foods, such as cake, cookies, candy, chips, and soft drinks. How do I count carbohydrates in foods? There are two ways to count carbohydrates in food. You can read food labels or learn standard serving sizes of foods. You can use either of these methods or a combination of both. Using the Nutrition Facts label The Nutrition Facts list is included on the labels of almost all packaged foods and beverages in the [place]. It includes: The serving size. Information about nutrients in each serving, including the grams of carbohydrate per serving. To use the Nutrition Facts, decide how many servings you will have. Then, multiply the number of servings by the number of carbohydrates per serving. The resulting number is the total grams of carbohydrates that you will be having. Learning the standard serving sizes of foods When you eat carbohydrate foods that are not packaged or do not include Nutrition Facts on the label, you need to measure the servings in order to count the grams of  carbohydrates. Measure the foods that you will eat with a food scale or measuring cup, if needed. Decide how many standard-size servings you will eat. Multiply the number of servings by 15. For foods that contain carbohydrates, one serving equals 15 g of carbohydrates. For example, if you eat 2 cups or 10 oz (300 g) of strawberries, you will have eaten 2 servings and 30 g of carbohydrates (2 servings x 15 g = 30 g). For foods that have more than one food mixed, such as soups and casseroles, you must count the carbohydrates in each food that is included. The following list contains standard serving sizes of common carbohydrate-rich foods. Each of these servings has about 15 g of carbohydrates: 1 slice of bread. 1 six-inch (15 cm) tortilla. ? cup or 2 oz (53 g) cooked rice or pasta.  cup or 3 oz (85 g) cooked or canned, drained and rinsed beans or lentils.  cup or 3 oz (85 g) starchy vegetable, such as peas, corn, or squash.  cup or 4 oz (120 g) hot cereal.  cup or 3 oz (85 g) boiled or mashed potatoes, or  or 3 oz (85 g) of a large baked potato.  cup or 4 fl oz (118 mL) fruit juice. 1 cup or 8 fl oz (237 mL) milk. 1 small or 4 oz (106 g) apple.  or 2 oz (63 g) of a medium banana. 1 cup or 5 oz (150 g) strawberries. 3 cups or 1 oz (28.3 g) popped popcorn. What is an example of carbohydrate counting? To calculate the grams of carbohydrates in this sample meal, follow the steps  shown below. Sample meal 3 oz (85 g) chicken breast. ? cup or 4 oz (106 g) brown rice.  cup or 3 oz (85 g) corn. 1 cup or 8 fl oz (237 mL) milk. 1 cup or 5 oz (150 g) strawberries with sugar-free whipped topping. Carbohydrate calculation Identify the foods that contain carbohydrates: Rice. Corn. Milk. Strawberries. Calculate how many servings you have of each food: 2 servings rice. 1 serving corn. 1 serving milk. 1 serving strawberries. Multiply each number of servings by 15 g: 2 servings rice x 15  g = 30 g. 1 serving corn x 15 g = 15 g. 1 serving milk x 15 g = 15 g. 1 serving strawberries x 15 g = 15 g. Add together all of the amounts to find the total grams of carbohydrates eaten: 30 g + 15 g + 15 g + 15 g = 75 g of carbohydrates total. What are tips for following this plan? Shopping Develop a meal plan and then make a shopping list. Buy fresh and frozen vegetables, fresh and frozen fruit, dairy, eggs, beans, lentils, and whole grains. Look at food labels. Choose foods that have more fiber and less sugar. Avoid processed foods and foods with added sugars. Meal planning Aim to have the same number of grams of carbohydrates at each meal and for each snack time. Plan to have regular, balanced meals and snacks. Where to find more information American Diabetes Association: diabetes.org Centers for Disease Control and Prevention: [employer].[place] Academy of Nutrition and Dietetics: eatright.org Association of Diabetes Care & Education Specialists: diabeteseducator.org Summary Carbohydrate counting is a method of keeping track of how many carbohydrates you eat. Eating carbohydrates increases the amount of sugar (glucose) in your blood. Counting how many carbohydrates you eat improves how well you manage your blood glucose. This helps you manage your diabetes. A dietitian can help you make a meal plan and calculate how many carbohydrates you should have at each meal and snack. This information is not intended to replace advice given to you by your health care provider. Make sure you discuss any questions you have with your health care provider. Document Revised: 05/27/2020 Document Reviewed: 05/27/2020 Elsevier Patient Education  [place] A heart attack occurs when blood and oxygen supply to the heart is cut off. A heart attack can cause damage to the heart that cannot be fixed. A heart attack is also called a myocardial infarction, or MI. If you think you are having a  heart attack, do not wait to see if the symptoms will go away. Get medical help right away. What are the causes? This condition may be caused by: A fatty substance (plaque) in the blood vessels (arteries). This can block the flow of blood to the heart. A blood clot in the blood vessels that go to the heart. The blood clot blocks blood flow. An abnormal heartbeat. Some diseases, such as problems in red blood cells (anemia)orproblems in breathing (respiratory failure). Tightening (spasm) of a blood vessel that cuts off blood to the heart. A tear in a blood vessel of the heart. Other causes may include: Using drugs such as cocaine or methamphetamine. Low blood pressure. What increases the risk? Aging. The risk gets higher as you get older. Having a personal or family history of chest pain, heart attack, stroke, or narrowing of the arteries in the legs, arms, head, or stomach (peripheral vascular disease). Having taken chemotherapy or immune-suppressing medicines. Being male. Being overweight or obese. Having  any of these conditions: High blood pressure. High cholesterol. Diabetes. Making lifestyle choices such as: Drinking too much alcohol. Not getting regular exercise. Smoking. What are the signs or symptoms? Chest pain. It may feel like: Crushing or squeezing. Tightness, pressure, fullness, or heaviness. Pain in the arm, neck, jaw, back, or upper body. Heartburn. Upset stomach (indigestion). Shortness of breath. Feeling like you may vomit (nauseous). Cold sweats. Sudden light-headedness, dizziness, or passing out. Feeling tired. How is this treated? A heart attack must be treated as soon as possible. Treatment may include: Medicines to: Break up or dissolve blood clots. Thin your blood and help prevent blood clots. Treat blood pressure. Improve blood flow to the heart. Reduce pain. Reduce cholesterol. Procedures to widen a blocked artery and keep it open. Open heart  surgery. Making your heart strong again (cardiac rehabilitation) through exercise, education, and counseling. Follow these instructions at home: Medicines Take over-the-counter and prescription medicines only as told by your doctor. Do not take these medicines unless your doctor says it is okay: NSAIDs, such as ibuprofen, naproxen, or celecoxib. Any vitamins or supplements. Hormone replacement therapy that has estrogen with or without progestin. If you are taking blood thinners: Talk with your doctor before taking any medicines that have aspirin or NSAIDs, such as ibuprofen. Take medicines exactly as told. Take them at the same time each day. Avoid doing things that could hurt or bruise you. Take action to prevent falls. Wear an alert bracelet or carry a card that shows you are taking blood thinners. Lifestyle  Do not smoke or use any products that contain nicotine or tobacco. If you need help quitting, ask your doctor. Avoid secondhand smoke. Exercise regularly. Ask your doctor about a cardiac rehab program. Eat heart-healthy foods. Your doctor will tell you what foods to eat. Stay at a healthy weight. Learn ways to lower your stress level. Do not use illegal drugs. Alcohol use Do not drink alcohol if: Your doctor tells you not to drink. You are pregnant, may be pregnant, or are planning to become pregnant. If you drink alcohol: Limit how much you have to: 0-1 drink a day for women. 0-2 drinks a day for men. Know how much alcohol is in your drink. In the [place] one drink equals one 12 oz bottle of beer (355 mL), one 5 oz glass of wine (148 mL), or one 1 oz glass of hard liquor (44 mL). General instructions Work with your doctor to treat other problems you may have, such as diabetes or high blood pressure. Get screened for depression. Get treatment if needed. Keep your vaccines up to date. Get the flu shot (influenza vaccine) every year. Keep all follow-up visits. Contact a  doctor if: You feel very sad. You have trouble doing your daily activities. You get light-headed or dizzy. Get help right away if: You have sudden, unexplained discomfort in your chest, arms, back, neck, jaw, or upper body. You have shortness of breath. You have sudden sweating or clammy skin. You feel like you may vomit or you vomit. You feel tired or weak. You feel your heart beating fast. You feel your heart skipping beats. You have blood pressure that is higher than 180/120. These symptoms may be an emergency. Get help right away. Call your local emergency services (911 in the [place] Do not wait to see if the symptoms will go away. Do not drive yourself to the hospital. Summary A heart attack occurs when blood and oxygen supply to the heart is  cut off. Do not take NSAIDs unless your doctor says it is okay. Do not smoke. Avoid secondhand smoke. Exercise regularly. Ask your doctor about a cardiac rehab program. This information is not intended to replace advice given to you by your health care provider. Make sure you discuss any questions you have with your health care provider. Document Revised: 04/15/2021 Document Reviewed: 04/15/2021 Elsevier Patient Education  [place]. Pancreatitis Eating Plan Pancreatitis is when your pancreas gets irritated and swollen (inflamed). The pancreas is a small gland behind your stomach. It helps your body digest food and regulate your blood sugar. Pancreatitis can affect how your body digests food, especially foods with fat. You may also have other symptoms such as pain in your abdomen (abdominal pain) or nausea. When you have pancreatitis, following a low-fat eating plan may help you manage symptoms and recover faster. Work with your health care provider or a dietitian to create an eating plan that is right for you. What are tips for following this plan? Reading food labels Use the information on food labels to help keep track of how much  fat you eat: Check the serving size. Look for the amount of total fat in grams (g) in one serving. Low-fat foods have 3 g of fat or less per serving. Fat-free foods have 0.5 g of fat or less per serving. Keep track of how much fat you eat based on how many servings you eat. For example, if you eat two servings, the amount of fat you eat will be twice what is listed on the label. Shopping  Buy low-fat or nonfat foods, such as: Fresh, frozen, or canned fruits and vegetables. Grains, including pasta, bread, and rice. Lean meat, poultry, fish, and other protein foods. Low-fat or nonfat dairy. Avoid buying bakery products and other sweets made with whole milk, butter, and eggs. Avoid buying snack foods with added fat, such as anything with butter or cheese flavoring. Cooking Remove skin from poultry, and remove extra fat from meat. Limit the amount of fat and oil you use to 6 tsp (30 mL) or less per day. Cook using low-fat methods, such as boiling, broiling, grilling, steaming, or baking. Use spray oil to cook. Add fat-free chicken broth to add flavor and moisture. Avoid adding cream to thicken soups or sauces. Use other thickeners such as corn starch or tomato paste. Meal planning  Eat a low-fat diet as told by your dietitian. For most people, this means having no more than 55-65 g of fat each day. Eat small, frequent meals throughout the day. For example, you may have 5 or 6 small meals instead of 3 large meals. Drink enough fluid to keep your urine pale yellow. Do not drink alcohol. Talk to your health care provider if you need help stopping. Limit how much caffeine you have, including black coffee, black and green tea, soft drinks with caffeine, and energy drinks. Plan to include a variety of foods in your diet. Include fruits, vegetables, whole grains and lean proteins, and low-fat or nonfat dairy. You need a balanced diet for good overall health. General information Let your health  care provider or dietitian know if you have unplanned weight loss on this eating plan. You may be told to follow a clear liquid or soft food diet when symptoms come back, which is called a flare. Talk with your health care provider about how to manage your diet during symptoms of a flare. Take any vitamins or supplements as told by  your health care provider. You may need to take: Extra vitamins that dissolve in fat (are fat soluble), such as vitamins A, D, E, and K. Nutritional supplements. Work with a Microbiologist, especially if you have other conditions such as obesity, osteoporosis, or diabetes mellitus. Some people need extra treatments, such as: Pills or capsules to replace enzymes (oral pancreatic enzyme replacement therapy). Feedings through a tube in the stomach or intestine (enteral feedings). What foods should I avoid? Fruits Fried fruits. Fruits served with butter or cream. Vegetables Fried vegetables. Vegetables cooked with butter, cheese, or cream. Grains Biscuits, waffles, donuts, pastries, and croissants. Pies and cookies. Butter-flavored popcorn. Regular crackers. Meats and other proteins Fatty cuts of meat. Poultry with skin. Organ meats. Precooked or cured meat, such as sausages or meat loaves. Whole eggs. Nuts and nut butters. Dairy Whole and 2% milk. Whole milk yogurt. Whole milk ice cream. Cream and half-and-half. Cheese, such as cream cheese. Sour cream. Beverages Wine, beer, and liquor. The items listed above may not be a complete list of foods and beverages you should avoid. Contact a dietitian for more information. Summary Pancreatitis can affect how your body digests food, especially foods with fat. When you have pancreatitis, it is recommended that you follow a low-fat eating plan to help you recover faster and manage symptoms. Do not drink alcohol. Limit the amount of caffeine you have, and drink enough fluid to keep your urine pale yellow. This information is not  intended to replace advice given to you by your health care provider. Make sure you discuss any questions you have with your health care provider. Document Revised: 10/13/2021 Document Reviewed: 10/13/2021 Elsevier Patient Education  [place]. High Cholesterol  High cholesterol is a condition in which the blood has high levels of a white, waxy substance similar to fat (cholesterol). The liver makes all the cholesterol that the body needs. The human body needs small amounts of cholesterol to help build cells. A person gets extra or excess cholesterol from the food that he or she eats. The blood carries cholesterol from the liver to the rest of the body. If you have high cholesterol, deposits (plaques) may build up on the walls of your arteries. Arteries are the blood vessels that carry blood away from your heart. These plaques make the arteries narrow and stiff. Cholesterol plaques increase your risk for heart attack and stroke. Work with your health care provider to keep your cholesterol levels in a healthy range. What increases the risk? The following factors may make you more likely to develop this condition: Eating foods that are high in animal fat (saturated fat) or cholesterol. Being overweight. Not getting enough exercise. A family history of high cholesterol (familial hypercholesterolemia). Use of tobacco products. Having diabetes. What are the signs or symptoms? In most cases, high cholesterol does not usually cause any symptoms. In severe cases, very high cholesterol levels can cause: Fatty bumps under the skin (xanthomas). A white or gray ring around the black center (pupil) of the eye. How is this diagnosed? This condition may be diagnosed based on the results of a blood test. If you are older than 54 years of age, your health care provider may check your cholesterol levels every 4-6 years. You may be checked more often if you have high cholesterol or other risk factors  for heart disease. The blood test for cholesterol measures: "Bad" cholesterol, or LDL cholesterol. This is the main type of cholesterol that causes heart disease. The desired level  is less than 100 mg/dL (2.59 mmol/L). "Good" cholesterol, or HDL cholesterol. HDL helps protect against heart disease by cleaning the arteries and carrying the LDL to the liver for processing. The desired level for HDL is 60 mg/dL (1.55 mmol/L) or higher. Triglycerides. These are fats that your body can store or burn for energy. The desired level is less than 150 mg/dL (1.69 mmol/L). Total cholesterol. This measures the total amount of cholesterol in your blood and includes LDL, HDL, and triglycerides. The desired level is less than 200 mg/dL (5.17 mmol/L). How is this treated? Treatment for high cholesterol starts with lifestyle changes, such as diet and exercise. Diet changes. You may be asked to eat foods that have more fiber and less saturated fats or added sugar. Lifestyle changes. These may include regular exercise, maintaining a healthy weight, and quitting use of tobacco products. Medicines. These are given when diet and lifestyle changes have not worked. You may be prescribed a statin medicine to help lower your cholesterol levels. Follow these instructions at home: Eating and drinking  Eat a healthy, balanced diet. This diet includes: Daily servings of a variety of fresh, frozen, or canned fruits and vegetables. Daily servings of whole grain foods that are rich in fiber. Foods that are low in saturated fats and trans fats. These include poultry and fish without skin, lean cuts of meat, and low-fat dairy products. A variety of fish, especially oily fish that contain omega-3 fatty acids. Aim to eat fish at least 2 times a week. Avoid foods and drinks that have added sugar. Use healthy cooking methods, such as roasting, grilling, broiling, baking, poaching, steaming, and stir-frying. Do not fry your food except  for stir-frying. If you drink alcohol: Limit how much you have to: 0-1 drink a day for women who are not pregnant. 0-2 drinks a day for men. Know how much alcohol is in a drink. In the [place] one drink equals one 12 oz bottle of beer (355 mL), one 5 oz glass of wine (148 mL), or one 1 oz glass of hard liquor (44 mL). Lifestyle  Get regular exercise. Aim to exercise for a total of 150 minutes a week. Increase your activity level by doing activities such as gardening, walking, and taking the stairs. Do not use any products that contain nicotine or tobacco. These products include cigarettes, chewing tobacco, and vaping devices, such as e-cigarettes. If you need help quitting, ask your health care provider. General instructions Take over-the-counter and prescription medicines only as told by your health care provider. Keep all follow-up visits. This is important. Where to find more information American Heart Association: www.heart.org National Heart, Lung, and Blood Institute: https://wilson-eaton.com/ Contact a health care provider if: You have trouble achieving or maintaining a healthy diet or weight. You are starting an exercise program. You are unable to stop smoking. Get help right away if: You have chest pain. You have trouble breathing. You have discomfort or pain in your jaw, neck, back, shoulder, or arm. You have any symptoms of a stroke. "BE FAST" is an easy way to remember the main warning signs of a stroke: B - Balance. Signs are dizziness, sudden trouble walking, or loss of balance. E - Eyes. Signs are trouble seeing or a sudden change in vision. F - Face. Signs are sudden weakness or numbness of the face, or the face or eyelid drooping on one side. A - Arms. Signs are weakness or numbness in an arm. This happens suddenly and usually on  one side of the body. S - Speech. Signs are sudden trouble speaking, slurred speech, or trouble understanding what people say. T - Time. Time to call  emergency services. Write down what time symptoms started. You have other signs of a stroke, such as: A sudden, severe headache with no known cause. Nausea or vomiting. Seizure. These symptoms may represent a serious problem that is an emergency. Do not wait to see if the symptoms will go away. Get medical help right away. Call your local emergency services (911 in the [place] Do not drive yourself to the hospital. Summary Cholesterol plaques increase your risk for heart attack and stroke. Work with your health care provider to keep your cholesterol levels in a healthy range. Eat a healthy, balanced diet, get regular exercise, and maintain a healthy weight. Do not use any products that contain nicotine or tobacco. These products include cigarettes, chewing tobacco, and vaping devices, such as e-cigarettes. Get help right away if you have any symptoms of a stroke. This information is not intended to replace advice given to you by your health care provider. Make sure you discuss any questions you have with your health care provider. Document Revised: 05/27/2022 Document Reviewed: 12/28/2020 Elsevier Patient Education  [place]. Mindfulness-Based Stress Reduction Mindfulness-based stress reduction (MBSR) is a program that helps people learn to practice mindfulness. Mindfulness is the practice of consciously paying attention to the present moment. MBSR focuses on developing self-awareness, which lets you respond to life stress without judgment or negative feelings. It can be learned and practiced through techniques such as education, breathing exercises, meditation, and yoga. MBSR includes several mindfulness techniques in one program. MBSR works best when you understand the treatment, are willing to try new things, and can commit to spending time practicing what you learn. MBSR training may include learning about: How your feelings, thoughts, and reactions affect your body. New ways to  respond to things that cause negative thoughts to start (triggers). How to notice your thoughts and let go of them. Practicing awareness of everyday things that you normally do without thinking. The techniques and goals of different types of meditation. What are the benefits of MBSR? MBSR can have many benefits, which include helping you to: Develop self-awareness. This means knowing and understanding yourself. Learn skills and attitudes that help you to take part in your own health care. Learn new ways to care for yourself. Be more accepting about how things are, and let things go. Be less judgmental and approach things with an open mind. Be patient with yourself and trust yourself more. MBSR has also been shown to: Reduce negative emotions, such as sadness, overwhelm, and worry. Improve memory and focus. Change how you sense and react to pain. Boost your body's ability to fight infections. Help you connect better with other people. Improve your sense of well-being. How to practice mindfulness To do a basic awareness exercise: Find a comfortable place to sit. Pay attention to the present moment. Notice your thoughts, feelings, and surroundings just as they are. Avoid judging yourself, your feelings, or your surroundings. Make note of any judgment that comes up and let it go. Your mind may wander, and that is okay. Make note of when your thoughts drift, and return your attention to the present moment. To do basic mindfulness meditation: Find a comfortable place to sit. This may include a stable chair or a firm floor cushion. Sit upright with your back straight. Let your arms fall next to your sides,  with your hands resting on your legs. If you are sitting in a chair, rest your feet flat on the floor. If you are sitting on a cushion, cross your legs in front of you. Keep your head in a neutral position with your chin dropped slightly. Relax your jaw and rest the tip of your tongue on the  roof of your mouth. Drop your gaze to the floor or close your eyes. Breathe normally and pay attention to your breath. Feel the air moving in and out of your nose. Feel your belly expanding and relaxing with each breath. Your mind may wander, and that is okay. Make note of when your thoughts drift, and return your attention to your breath. Avoid judging yourself, your feelings, or your surroundings. Make note of any judgment or feelings that come up, let them go, and bring your attention back to your breath. When you are ready, lift your gaze or open your eyes. Pay attention to how your body feels after the meditation. Follow these instructions at home:  Find a local in-person or online MBSR program. Set aside some time regularly for mindfulness practice. Practice every day if you can. Even 10 minutes of practice is helpful. Find a mindfulness practice that works best for you. This may include one or more of the following: Meditation. This involves focusing your mind on a certain thought or activity. Breathing awareness exercises. These help you to stay present by focusing on your breath. Body scan. For this practice, you lie down and pay attention to each part of your body from head to toe. You can identify tension and soreness and consciously relax parts of your body. Yoga. Yoga involves stretching and breathing, and it can improve your ability to move and be flexible. It can also help you to test your body's limits, which can help you release stress. Mindful eating. This way of eating involves focusing on the taste, texture, color, and smell of each bite of food. This slows down eating and helps you feel full sooner. For this reason, it can be an important part of a weight loss plan. Find a podcast or recording that provides guidance for breathing awareness, body scan, or meditation exercises. You can listen to these any time when you have a free moment to rest without distractions. Follow your  treatment plan as told by your health care provider. This may include taking regular medicines and making changes to your diet or lifestyle as recommended. Where to find more information You can find more information about MBSR from: Your health care provider. Community-based meditation centers or programs. Programs offered near you. Summary Mindfulness-based stress reduction (MBSR) is a program that teaches you how to consciously pay attention to the present moment. It is used to help you deal better with daily stress, feelings, and pain. MBSR focuses on developing self-awareness, which allows you to respond to life stress without judgment or negative feelings. MBSR programs may involve learning different mindfulness practices, such as breathing exercises, meditation, yoga, body scan, or mindful eating. Find a mindfulness practice that works best for you, and set aside time for it on a regular basis. This information is not intended to replace advice given to you by your health care provider. Make sure you discuss any questions you have with your health care provider. Document Revised: 06/03/2021 Document Reviewed: 06/03/2021 Elsevier Patient Education  [place].

## 2023-02-03 ENCOUNTER — Other Ambulatory Visit: Payer: Self-pay

## 2023-02-03 LAB — UA/M W/RFLX CULTURE, ROUTINE
Bilirubin, UA: NEGATIVE
Glucose, UA: NEGATIVE
Ketones, UA: NEGATIVE
Leukocytes,UA: NEGATIVE
Nitrite, UA: NEGATIVE
Protein,UA: NEGATIVE
RBC, UA: NEGATIVE
Specific Gravity, UA: 1.015 (ref 1.005–1.030)
Urobilinogen, Ur: 0.2 mg/dL (ref 0.2–1.0)
pH, UA: 5.5 (ref 5.0–7.5)

## 2023-02-03 LAB — MICROSCOPIC EXAMINATION
Bacteria, UA: NONE SEEN
Casts: NONE SEEN /lpf
Epithelial Cells (non renal): NONE SEEN /hpf (ref 0–10)
RBC, Urine: NONE SEEN /hpf (ref 0–2)
WBC, UA: NONE SEEN /hpf (ref 0–5)

## 2023-02-03 LAB — COMPREHENSIVE METABOLIC PANEL
ALT: 26 IU/L (ref 0–44)
AST: 31 IU/L (ref 0–40)
Albumin/Globulin Ratio: 1.2 (ref 1.2–2.2)
Albumin: 4.6 g/dL (ref 3.8–4.9)
Alkaline Phosphatase: 141 IU/L — ABNORMAL HIGH (ref 44–121)
BUN/Creatinine Ratio: 9 (ref 9–20)
BUN: 8 mg/dL (ref 6–24)
Bilirubin Total: 0.4 mg/dL (ref 0.0–1.2)
CO2: 20 mmol/L (ref 20–29)
Calcium: 10 mg/dL (ref 8.7–10.2)
Chloride: 100 mmol/L (ref 96–106)
Creatinine, Ser: 0.93 mg/dL (ref 0.76–1.27)
Globulin, Total: 3.9 g/dL (ref 1.5–4.5)
Glucose: 107 mg/dL — ABNORMAL HIGH (ref 70–99)
Potassium: 4.2 mmol/L (ref 3.5–5.2)
Sodium: 140 mmol/L (ref 134–144)
Total Protein: 8.5 g/dL (ref 6.0–8.5)
eGFR: 98 mL/min/{1.73_m2} (ref >59)

## 2023-02-03 LAB — CBC
Hematocrit: 50.4 % (ref 37.5–51.0)
Hemoglobin: 16.6 g/dL (ref 13.0–17.7)
MCH: 29.2 pg (ref 26.6–33.0)
MCHC: 32.9 g/dL (ref 31.5–35.7)
MCV: 89 fL (ref 79–97)
Platelets: 268 10*3/uL (ref 150–450)
RBC: 5.69 x10E6/uL (ref 4.14–5.80)
RDW: 13.3 % (ref 11.6–15.4)
WBC: 4.1 10*3/uL (ref 3.4–10.8)

## 2023-02-06 ENCOUNTER — Other Ambulatory Visit: Payer: Self-pay

## 2023-02-17 ENCOUNTER — Ambulatory Visit: Payer: Self-pay | Admitting: Infectious Diseases

## 2023-02-23 ENCOUNTER — Other Ambulatory Visit: Payer: Self-pay

## 2023-03-08 ENCOUNTER — Other Ambulatory Visit: Payer: Self-pay

## 2023-03-08 ENCOUNTER — Ambulatory Visit: Payer: Self-pay | Admitting: Infectious Diseases

## 2023-03-09 ENCOUNTER — Other Ambulatory Visit: Payer: Self-pay

## 2023-03-14 ENCOUNTER — Ambulatory Visit: Payer: Self-pay | Admitting: Gerontology

## 2023-03-14 ENCOUNTER — Other Ambulatory Visit: Payer: Self-pay

## 2023-03-16 ENCOUNTER — Ambulatory Visit: Payer: Self-pay

## 2023-03-24 ENCOUNTER — Other Ambulatory Visit: Payer: Self-pay

## 2023-04-12 ENCOUNTER — Other Ambulatory Visit: Payer: Self-pay

## 2023-04-12 DIAGNOSIS — B182 Chronic viral hepatitis C: Secondary | ICD-10-CM

## 2023-04-14 ENCOUNTER — Other Ambulatory Visit: Payer: Self-pay

## 2023-04-14 LAB — HEPATIC FUNCTION PANEL
AG Ratio: 1.3 (calc) (ref 1.0–2.5)
ALT: 38 U/L (ref 9–46)
AST: 48 U/L — ABNORMAL HIGH (ref 10–35)
Albumin: 4.3 g/dL (ref 3.6–5.1)
Alkaline phosphatase (APISO): 115 U/L (ref 35–144)
Bilirubin, Direct: 0.2 mg/dL (ref 0.0–0.2)
Globulin: 3.4 g/dL (calc) (ref 1.9–3.7)
Indirect Bilirubin: 0.5 mg/dL (calc) (ref 0.2–1.2)
Total Bilirubin: 0.7 mg/dL (ref 0.2–1.2)
Total Protein: 7.7 g/dL (ref 6.1–8.1)

## 2023-04-14 LAB — HEPATITIS C RNA QUANTITATIVE
HCV Quantitative Log: <1.18 log IU/mL
HCV RNA, PCR, QN: <15 IU/mL

## 2023-04-27 ENCOUNTER — Ambulatory Visit: Payer: Self-pay

## 2023-05-09 ENCOUNTER — Ambulatory Visit (INDEPENDENT_AMBULATORY_CARE_PROVIDER_SITE_OTHER): Payer: Self-pay | Admitting: Infectious Diseases

## 2023-05-09 ENCOUNTER — Encounter: Payer: Self-pay | Admitting: Infectious Diseases

## 2023-05-09 ENCOUNTER — Other Ambulatory Visit: Payer: Self-pay

## 2023-05-09 VITALS — BP 125/79 | HR 69 | Temp 98.0°F | Wt 178.2 lb

## 2023-05-09 DIAGNOSIS — Z8619 Personal history of other infectious and parasitic diseases: Secondary | ICD-10-CM

## 2023-05-09 NOTE — Patient Instructions (Signed)
Congratulations on your cure!  You can follow up with your primary care team for twice yearly ultrasound screenings to ensure no liver cancer. The risk for this is much reduced, but still not zero risk.   Switch to non-alcoholic beer if you can to help prevent liver and pancrease inflammation.

## 2023-05-09 NOTE — Progress Notes (Signed)
Patient Name: Bruce [name]  Date of Birth: 11-10-1970  MRN: [id number]  PCP: Rolm Gala, NP  Referring Provider: Rolm Gala, NP, Ph#: [phone]   CC:  Hepatitis c follow up     HPI/ROS:  Bruce [name] is a 52 y.o. male with chronic hepatitis C GT 1b, F4 on Fibrotest but Elastography was normal.   Failed treatment with Mavyret with rebound viremia July 2023.   No trouble with Vosevi. Completed course a while ago. Has had delay in follow up here d/t hospitalizations. Most recently hospitalized for alcoholic pancreatitis. He states he has decreased alcohol to essentially none.  Hep C RNA levels:  06/2021 - 31,800,000 million copies  11/2021 - < 15 copies  01/11/22 - < 15 copies  05/21/22 - 5.71 million copies  07/25/22 - < 15 copies     Review of Systems  Constitutional:  Negative for chills and fever.  HENT:  Negative for sore throat.   Respiratory:  Negative for cough and shortness of breath.   Cardiovascular: Negative.   Gastrointestinal:  Negative for abdominal pain, diarrhea and vomiting.  Musculoskeletal:  Negative for myalgias and neck pain.  Skin:  Negative for rash.  Neurological:  Negative for headaches.  Psychiatric/Behavioral:  The patient is not nervous/anxious.     All other systems reviewed and are negative      Past Medical History:  Diagnosis Date   A-fib (HCC)    Dental caries    Hepatitis C infection    Iritis    PAF (paroxysmal atrial fibrillation) (HCC)    a. diagnosed 03/19/18; b. CHADS2VASc 0   Polysubstance abuse (HCC)    a. cocaine, tobacco, etoh   Scrotal mass    Tick borne fever    Uveitis    Voiding difficulty 01/11/2023    Prior to Admission medications   Medication Sig Start Date End Date Taking? Authorizing Provider  Cyanocobalamin (B-12 PO) Take by mouth as needed.    [provider]  diltiazem (CARDIZEM CD) 120 MG 24 hr capsule Take 1 capsule (120 mg total) by mouth daily. 06/09/21 07/09/21   Furth, Cadence H, PA-C    Allergies  Allergen Reactions   Penicillins Other (See Comments)    Did it involve swelling of the face/tongue/throat, SOB, or low BP? No Did it involve sudden or severe rash/hives, skin peeling, or any reaction on the inside of your mouth or nose? No Did you need to seek medical attention at a hospital or doctor's office? No When did it last happen?       If all above answers are "NO", may proceed with cephalosporin use.    Social History   Tobacco Use   Smoking status: Former    Current packs/day: 0.00    Types: Cigarettes    Quit date: 12/2021    Years since quitting: 1.4   Smokeless tobacco: Never   Tobacco comments:    Per pt  " I only smoke cigarettes when I drink beer"  Vaping Use   Vaping status: Never Used  Substance Use Topics   Alcohol use: Yes    Comment: 6 pack per week, last use 1 beer end of 10/2022   Drug use: Not Currently    Types: IV, Cocaine    Comment: Pt states that he last used "a while back" "several months ago"    Family History  Problem Relation Age of Onset   Schizophrenia Mother    Drug  abuse Father        throat cancer   Other Maternal Grandmother        unknown medical history   Alzheimer's disease Maternal Grandfather    Cancer Paternal Grandmother    Other Paternal Grandfather        unknown medical history     Objective:   Vitals:   05/09/23 1538  BP: 125/79  Pulse: 69  Temp: 98 F (36.7 C)  SpO2: 99%    No physical exam    Laboratory: Genotype:  Lab Results  Component Value Date   HCVGENOTYPE 1b 06/29/2021   HCV viral load:  Lab Results  Component Value Date   HCVQUANT 700,490 (H) 05/22/2012   Lab Results  Component Value Date   WBC 5.0 05/22/2023   HGB 15.0 05/22/2023   HCT 42.2 05/22/2023   MCV 86.3 05/22/2023   PLT 217 05/22/2023    Lab Results  Component Value Date   CREATININE 0.91 05/22/2023   BUN 8 05/22/2023   NA 136 05/22/2023   K 3.2 (L) 05/22/2023   CL 104  05/22/2023   CO2 25 05/22/2023    Lab Results  Component Value Date   ALT 38 04/12/2023   AST 48 (H) 04/12/2023   GGT 262 (H) 05/16/2022   ALKPHOS 141 (H) 02/02/2023    Lab Results  Component Value Date   INR 1.0 06/29/2021   BILITOT 0.7 04/12/2023   ALBUMIN 4.6 02/02/2023    Imaging:  CT scan in February 2022 as outlined above. +Hepatic steatosis   Assessment & Plan:   Problem List Items Addressed This Visit       Unprioritized   Hepatitis C virus infection cured after antiviral drug therapy - Primary    Bruce [name] has been cured with 2nd attempt at treatment - failed the Mavyret course (suspect due to poor adherence not drug resistance). Completed 12 weeks of vosevi and now has achieved SVR.   He has findings concerning for advanced fibrosis/cirrhosis and would benefit from 2x yearly ultrasounds + AFP for [facility] screening which can be maintained by his primary care provider.   Encouraged to stop all alcohol use given liver disease and pancreatitis flares.   He does not need further ID follow up at this time.       Rexene Alberts, MSN, NP-C [facility]  [email or web address] Pager: [phone] Office: [phone] [facility] Main Line: [phone]

## 2023-05-16 ENCOUNTER — Encounter: Payer: Self-pay | Admitting: Gerontology

## 2023-05-16 ENCOUNTER — Ambulatory Visit: Payer: Self-pay | Admitting: Gerontology

## 2023-05-16 ENCOUNTER — Other Ambulatory Visit: Payer: Self-pay

## 2023-05-16 VITALS — BP 134/84 | HR 60 | Wt 180.3 lb

## 2023-05-16 DIAGNOSIS — Z87438 Personal history of other diseases of male genital organs: Secondary | ICD-10-CM

## 2023-05-16 DIAGNOSIS — B353 Tinea pedis: Secondary | ICD-10-CM

## 2023-05-16 DIAGNOSIS — N5089 Other specified disorders of the male genital organs: Secondary | ICD-10-CM

## 2023-05-16 DIAGNOSIS — M25521 Pain in right elbow: Secondary | ICD-10-CM

## 2023-05-16 DIAGNOSIS — M25561 Pain in right knee: Secondary | ICD-10-CM

## 2023-05-16 DIAGNOSIS — S025XXA Fracture of tooth (traumatic), initial encounter for closed fracture: Secondary | ICD-10-CM | POA: Insufficient documentation

## 2023-05-16 DIAGNOSIS — I10 Essential (primary) hypertension: Secondary | ICD-10-CM

## 2023-05-16 DIAGNOSIS — I480 Paroxysmal atrial fibrillation: Secondary | ICD-10-CM

## 2023-05-16 DIAGNOSIS — B356 Tinea cruris: Secondary | ICD-10-CM

## 2023-05-16 MED ORDER — TERBINAFINE HCL 1 % EX CREA
1.0000 | TOPICAL_CREAM | Freq: Two times a day (BID) | CUTANEOUS | 0 refills | Status: DC
  Filled 2023-05-16: qty 42, 21d supply, fill #0

## 2023-05-16 MED ORDER — MELOXICAM 7.5 MG PO TABS
7.5000 mg | ORAL_TABLET | Freq: Every day | ORAL | 0 refills | Status: DC
  Filled 2023-05-16: qty 30, 30d supply, fill #0

## 2023-05-16 MED ORDER — TAMSULOSIN HCL 0.4 MG PO CAPS
0.4000 mg | ORAL_CAPSULE | Freq: Every day | ORAL | 2 refills | Status: DC
  Filled 2023-05-16: qty 30, 30d supply, fill #0
  Filled 2023-06-14: qty 30, 30d supply, fill #1
  Filled 2023-08-31: qty 30, 30d supply, fill #2

## 2023-05-16 MED ORDER — DILTIAZEM HCL ER COATED BEADS 120 MG PO CP24
120.0000 mg | ORAL_CAPSULE | Freq: Every day | ORAL | 2 refills | Status: DC
  Filled 2023-05-16: qty 30, 30d supply, fill #0
  Filled 2023-06-14: qty 30, 30d supply, fill #1
  Filled 2023-07-27: qty 30, 30d supply, fill #2

## 2023-05-16 NOTE — Patient Instructions (Signed)
Jock Itch Jock itch (tinea cruris) is an infection of the skin in the groin area that is caused by a fungus. Jock itch causes an itchy rash in the groin and upper thigh area. It usually goes away in 2-3 weeks with treatment. What are the causes? The fungus that causes jock itch may be spread by: Touching a fungal infection elsewhere on your body, such as athlete's foot, and then touching your groin area. Sharing towels or clothing, such as socks or shoes, with someone who has a fungal infection. What increases the risk? Jock itch is most common in men and adolescent boys. You are also more likely to develop the condition if you: Are in a hot, humid climate. Wear tight-fitting clothing or wet bathing suits for long periods of time. Play sports. Are overweight. Have diabetes. Have a weakened body defense system (immune system). Sweat a lot. What are the signs or symptoms? Symptoms of jock itch may include: A red, pink, or brown rash in the groin area. Blisters may be present. The rash may spread to the thighs, the opening between the buttocks (anus), and the buttocks. Dry and scaly skin on or around the rash. Itchiness. How is this diagnosed? In most cases, your health care provider can make the diagnosis by looking at your rash. In some cases, a sample of infected skin may be scraped off. This sample may be examined under a microscope (biopsy) or by trying to grow the fungus from the sample (culture). How is this treated? Treatment for this condition may include: Antifungal medicine to kill the fungus. This may be a skin cream, ointment, or powder, or it may be a medicine that you take by mouth (orally). Skin cream or ointment to reduce itching. Lifestyle changes, such as wearing looser clothing and caring for your skin. Follow these instructions at home: Skin care Apply skin creams, ointments, or powders exactly as told by your health care provider. Wear loose-fitting clothing that does  not rub against your groin area. Men should wear boxer shorts or loose-fitting underwear. Keep your groin area clean and dry. Change your underwear every day. Change out of wet bathing suits as soon as possible. After bathing, use a separate towel to gently dry your groin area thoroughly. Using a separate towel will help prevent spreading the infection to other parts of your body. Avoid hot baths and showers. Hot water can make itching worse. Do not scratch the affected area. General instructions Take and apply over-the-counter and prescription medicines only as told by your health care provider. Do not share towels, clothing, or personal items with other people. Wash your hands often with soap and water for at least 20 seconds, especially after touching your groin area. If soap and water are not available, use hand sanitizer. When at the gym: Always wear shoes, especially in the shower and around the swimming pool. Keep any cuts covered. Disinfect any mats or equipment before using them. Shower immediately after working out. Keep all follow-up visits. This is important. Contact a health care provider if: Your rash: Gets worse or does not get better after 2 weeks of treatment. Spreads. Returns after treatment is finished. You have any of the following: A fever. New or worsening redness, swelling, or pain around your rash. Fluid, blood, or pus coming from your rash. Summary Jock itch (tinea cruris) is a fungal infection of the skin in the groin area. The fungus can be spread by sharing clothing or by touching a fungus infection  elsewhere on your body and then touching your groin area. Treatment may include antifungal medicine and lifestyle changes, such as keeping the area clean and dry. This information is not intended to replace advice given to you by your health care provider. Make sure you discuss any questions you have with your health care provider. Document Revised: 01/12/2021  Document Reviewed: 01/12/2021 Elsevier Patient Education  2024 Elsevier Inc. DASH Eating Plan DASH stands for Dietary Approaches to Stop Hypertension. The DASH eating plan is a healthy eating plan that has been shown to: Lower high blood pressure (hypertension). Reduce your risk for type 2 diabetes, heart disease, and stroke. Help with weight loss. What are tips for following this plan? Reading food labels Check food labels for the amount of salt (sodium) per serving. Choose foods with less than 5 percent of the Daily Value (DV) of sodium. In general, foods with less than 300 milligrams (mg) of sodium per serving fit into this eating plan. To find whole grains, look for the word "whole" as the first word in the ingredient list. Shopping Buy products labeled as "low-sodium" or "no salt added." Buy fresh foods. Avoid canned foods and pre-made or frozen meals. Cooking Try not to add salt when you cook. Use salt-free seasonings or herbs instead of table salt or sea salt. Check with your health care provider or pharmacist before using salt substitutes. Do not fry foods. Cook foods in healthy ways, such as baking, boiling, grilling, roasting, or broiling. Cook using oils that are good for your heart. These include olive, canola, avocado, soybean, and sunflower oil. Meal planning  Eat a balanced diet. This should include: 4 or more servings of fruits and 4 or more servings of vegetables each day. Try to fill half of your plate with fruits and vegetables. 6-8 servings of whole grains each day. 6 or less servings of lean meat, poultry, or fish each day. 1 oz is 1 serving. A 3 oz (85 g) serving of meat is about the same size as the palm of your hand. One egg is 1 oz (28 g). 2-3 servings of low-fat dairy each day. One serving is 1 cup (237 mL). 1 serving of nuts, seeds, or beans 5 times each week. 2-3 servings of heart-healthy fats. Healthy fats called omega-3 fatty acids are found in foods such as  walnuts, flaxseeds, fortified milks, and eggs. These fats are also found in cold-water fish, such as sardines, salmon, and mackerel. Limit how much you eat of: Canned or prepackaged foods. Food that is high in trans fat, such as fried foods. Food that is high in saturated fat, such as fatty meat. Desserts and other sweets, sugary drinks, and other foods with added sugar. Full-fat dairy products. Do not salt foods before eating. Do not eat more than 4 egg yolks a week. Try to eat at least 2 vegetarian meals a week. Eat more home-cooked food and less restaurant, buffet, and fast food. Lifestyle When eating at a restaurant, ask if your food can be made with less salt or no salt. If you drink alcohol: Limit how much you have to: 0-1 drink a day if you are male. 0-2 drinks a day if you are male. Know how much alcohol is in your drink. In the [place] one drink is one 12 oz bottle of beer (355 mL), one 5 oz glass of wine (148 mL), or one 1 oz glass of hard liquor (44 mL). General information Avoid eating more than 2,300 mg of  salt a day. If you have hypertension, you may need to reduce your sodium intake to 1,500 mg a day. Work with your provider to stay at a healthy body weight or lose weight. Ask what the best weight range is for you. On most days of the week, get at least 30 minutes of exercise that causes your heart to beat faster. This may include walking, swimming, or biking. Work with your provider or dietitian to adjust your eating plan to meet your specific calorie needs. What foods should I eat? Fruits All fresh, dried, or frozen fruit. Canned fruits that are in their natural juice and do not have sugar added to them. Vegetables Fresh or frozen vegetables that are raw, steamed, roasted, or grilled. Low-sodium or reduced-sodium tomato and vegetable juice. Low-sodium or reduced-sodium tomato sauce and tomato paste. Low-sodium or reduced-sodium canned vegetables. Grains Whole-grain or  whole-wheat bread. Whole-grain or whole-wheat pasta. Brown rice. Orpah Cobb. Bulgur. Whole-grain and low-sodium cereals. Pita bread. Low-fat, low-sodium crackers. Whole-wheat flour tortillas. Meats and other proteins Skinless chicken or [place]. Ground chicken or [place]. Pork with fat trimmed off. Fish and seafood. Egg whites. Dried beans, peas, or lentils. Unsalted nuts, nut butters, and seeds. Unsalted canned beans. Lean cuts of beef with fat trimmed off. Low-sodium, lean precooked or cured meat, such as sausages or meat loaves. Dairy Low-fat (1%) or fat-free (skim) milk. Reduced-fat, low-fat, or fat-free cheeses. Nonfat, low-sodium ricotta or cottage cheese. Low-fat or nonfat yogurt. Low-fat, low-sodium cheese. Fats and oils Soft margarine without trans fats. Vegetable oil. Reduced-fat, low-fat, or light mayonnaise and salad dressings (reduced-sodium). Canola, safflower, olive, avocado, soybean, and sunflower oils. Avocado. Seasonings and condiments Herbs. Spices. Seasoning mixes without salt. Other foods Unsalted popcorn and pretzels. Fat-free sweets. The items listed above may not be all the foods and drinks you can have. Talk to a dietitian to learn more. What foods should I avoid? Fruits Canned fruit in a light or heavy syrup. Fried fruit. Fruit in cream or butter sauce. Vegetables Creamed or fried vegetables. Vegetables in a cheese sauce. Regular canned vegetables that are not marked as low-sodium or reduced-sodium. Regular canned tomato sauce and paste that are not marked as low-sodium or reduced-sodium. Regular tomato and vegetable juices that are not marked as low-sodium or reduced-sodium. Rosita Fire. Olives. Grains Baked goods made with fat, such as croissants, muffins, or some breads. Dry pasta or rice meal packs. Meats and other proteins Fatty cuts of meat. Ribs. Fried meat. Tomasa Blase. Bologna, salami, and other precooked or cured meats, such as sausages or meat loaves, that are not  lean and low in sodium. Fat from the back of a pig (fatback). Bratwurst. Salted nuts and seeds. Canned beans with added salt. Canned or smoked fish. Whole eggs or egg yolks. Chicken or [place] with skin. Dairy Whole or 2% milk, cream, and half-and-half. Whole or full-fat cream cheese. Whole-fat or sweetened yogurt. Full-fat cheese. Nondairy creamers. Whipped toppings. Processed cheese and cheese spreads. Fats and oils Butter. Stick margarine. Lard. Shortening. Ghee. Bacon fat. Tropical oils, such as coconut, palm kernel, or palm oil. Seasonings and condiments Onion salt, garlic salt, seasoned salt, table salt, and sea salt. Worcestershire sauce. Tartar sauce. Barbecue sauce. Teriyaki sauce. Soy sauce, including reduced-sodium soy sauce. Steak sauce. Canned and packaged gravies. Fish sauce. Oyster sauce. Cocktail sauce. Store-bought horseradish. Ketchup. Mustard. Meat flavorings and tenderizers. Bouillon cubes. Hot sauces. Pre-made or packaged marinades. Pre-made or packaged taco seasonings. Relishes. Regular salad dressings. Other foods Salted popcorn and pretzels.  The items listed above may not be all the foods and drinks you should avoid. Talk to a dietitian to learn more. Where to find more information National Heart, Lung, and Blood Institute (NHLBI): BuffaloDryCleaner.gl American Heart Association (AHA): heart.org Academy of Nutrition and Dietetics: eatright.org National Kidney Foundation (NKF): kidney.org This information is not intended to replace advice given to you by your health care provider. Make sure you discuss any questions you have with your health care provider. Document Revised: 11/10/2022 Document Reviewed: 11/10/2022 Elsevier Patient Education  2024 [employer].

## 2023-05-16 NOTE — Progress Notes (Signed)
   Established Patient Office Visit  Subjective   [name], male    DOB: 09-30-1971  Age: 52 y.o. MRN: [id number]  No chief complaint on file.   HPI  Bruce [name] is a 52 y/o male with a PMH of afib (not on anticoag due to chas vas score of 0), hypertension, BPH and prediabetes. He states that he's compliant with his medications, denies side effects and continues to make healthy lifestyle changes. Currently, he feels like his testicles are swollen, denies erythema, pain. Also c/o rash to groin area C/o intermittent right elbow pain, states that activity aggravates pain, states that pain is sharp 10/10, states that taking tylenol offers moderate relief. Also constatnt right knee pain ,and standing and walking aggravates symptoms. Staes that he wears knee brace. Also states that his partials chipped and request dental referral  Review of Systems  Constitutional: Negative.   Eyes: Negative.   Respiratory: Negative.    Cardiovascular: Negative.       Objective:     BP 134/84 (BP Location: Left Arm, Patient Position: Sitting, Cuff Size: Large)   Pulse 60   Wt 180 lb 4.8 oz (81.8 kg)   BMI 28.24 kg/m  BP Readings from Last 3 Encounters:  05/16/23 134/84  05/09/23 125/79  02/02/23 117/79   Wt Readings from Last 3 Encounters:  05/16/23 180 lb 4.8 oz (81.8 kg)  05/09/23 178 lb 3.2 oz (80.8 kg)  02/02/23 176 lb 8 oz (80.1 kg)      Physical Exam   No results found for any visits on 05/16/23.  Last CBC Lab Results  Component Value Date   WBC 4.1 02/02/2023   HGB 16.6 02/02/2023   HCT 50.4 02/02/2023   MCV 89 02/02/2023   MCH 29.2 02/02/2023   RDW 13.3 02/02/2023   PLT 268 02/02/2023   Last metabolic panel Lab Results  Component Value Date   GLUCOSE 107 (H) 02/02/2023   NA 140 02/02/2023   K 4.2 02/02/2023   CL 100 02/02/2023   CO2 20 02/02/2023   BUN 8 02/02/2023   CREATININE 0.93 02/02/2023   EGFR 98 02/02/2023   CALCIUM 10.0 02/02/2023    PHOS 3.2 10/18/2021   PROT 7.7 04/12/2023   ALBUMIN 4.6 02/02/2023   LABGLOB 3.9 02/02/2023   AGRATIO 1.2 02/02/2023   BILITOT 0.7 04/12/2023   ALKPHOS 141 (H) 02/02/2023   AST 48 (H) 04/12/2023   ALT 38 04/12/2023   ANIONGAP 9 01/12/2023   Last lipids Lab Results  Component Value Date   CHOL 145 05/18/2022   HDL 53 05/18/2022   LDLCALC 72 05/18/2022   TRIG 113 05/18/2022   CHOLHDL 2.7 05/18/2022   Last hemoglobin A1c Lab Results  Component Value Date   HGBA1C 6.1 (A) 11/08/2022   Last thyroid functions Lab Results  Component Value Date   TSH 0.219 (L) 12/21/2020   Last vitamin D No results found for: "25OHVITD2", "25OHVITD3", "VD25OH"    The 10-year ASCVD risk score (Arnett DK, et al., 2019) is: 10.2%    Assessment & Plan:   Problem List Items Addressed This Visit       Cardiovascular and Mediastinum   Atrial fibrillation (HCC)     Other   History of BPH   Other Visit Diagnoses     Essential (primary) hypertension           No follow-ups on file.    Burns Timson Trellis Paganini, NP

## 2023-05-22 ENCOUNTER — Emergency Department: Payer: Self-pay

## 2023-05-22 ENCOUNTER — Encounter: Payer: Self-pay | Admitting: Radiology

## 2023-05-22 ENCOUNTER — Emergency Department
Admission: EM | Admit: 2023-05-22 | Discharge: 2023-05-22 | Disposition: A | Discharge status: Home / Self Care | Payer: Self-pay | Attending: Emergency Medicine | Admitting: Emergency Medicine

## 2023-05-22 ENCOUNTER — Other Ambulatory Visit: Payer: Self-pay

## 2023-05-22 DIAGNOSIS — S92244A Nondisplaced fracture of medial cuneiform of right foot, initial encounter for closed fracture: Secondary | ICD-10-CM

## 2023-05-22 DIAGNOSIS — S301XXA Contusion of abdominal wall, initial encounter: Secondary | ICD-10-CM | POA: Insufficient documentation

## 2023-05-22 DIAGNOSIS — S6991XA Unspecified injury of right wrist, hand and finger(s), initial encounter: Secondary | ICD-10-CM

## 2023-05-22 DIAGNOSIS — S99911A Unspecified injury of right ankle, initial encounter: Secondary | ICD-10-CM

## 2023-05-22 DIAGNOSIS — S0990XA Unspecified injury of head, initial encounter: Secondary | ICD-10-CM

## 2023-05-22 DIAGNOSIS — S52501A Unspecified fracture of the lower end of right radius, initial encounter for closed fracture: Secondary | ICD-10-CM

## 2023-05-22 DIAGNOSIS — W11XXXA Fall on and from ladder, initial encounter: Secondary | ICD-10-CM | POA: Insufficient documentation

## 2023-05-22 DIAGNOSIS — S52591A Other fractures of lower end of right radius, initial encounter for closed fracture: Secondary | ICD-10-CM | POA: Insufficient documentation

## 2023-05-22 DIAGNOSIS — W19XXXA Unspecified fall, initial encounter: Secondary | ICD-10-CM

## 2023-05-22 DIAGNOSIS — S0083XA Contusion of other part of head, initial encounter: Secondary | ICD-10-CM | POA: Insufficient documentation

## 2023-05-22 DIAGNOSIS — S3013XA Contusion of flank (latus) region, initial encounter: Secondary | ICD-10-CM

## 2023-05-22 LAB — CBC WITH DIFFERENTIAL/PLATELET
Abs Immature Granulocytes: 0 10*3/uL (ref 0.00–0.07)
Basophils Absolute: 0 10*3/uL (ref 0.0–0.1)
Basophils Relative: 0 %
Eosinophils Absolute: 0.1 10*3/uL (ref 0.0–0.5)
Eosinophils Relative: 1 %
HCT: 42.2 % (ref 39.0–52.0)
Hemoglobin: 15 g/dL (ref 13.0–17.0)
Immature Granulocytes: 0 %
Lymphocytes Relative: 32 %
Lymphs Abs: 1.6 10*3/uL (ref 0.7–4.0)
MCH: 30.7 pg (ref 26.0–34.0)
MCHC: 35.5 g/dL (ref 30.0–36.0)
MCV: 86.3 fL (ref 80.0–100.0)
Monocytes Absolute: 0.6 10*3/uL (ref 0.1–1.0)
Monocytes Relative: 13 %
Neutro Abs: 2.7 10*3/uL (ref 1.7–7.7)
Neutrophils Relative %: 54 %
Platelets: 217 10*3/uL (ref 150–400)
RBC: 4.89 MIL/uL (ref 4.22–5.81)
RDW: 13.2 % (ref 11.5–15.5)
WBC: 5 10*3/uL (ref 4.0–10.5)
nRBC: 0 % (ref 0.0–0.2)

## 2023-05-22 LAB — BASIC METABOLIC PANEL
Anion gap: 7 (ref 5–15)
BUN: 8 mg/dL (ref 6–20)
CO2: 25 mmol/L (ref 22–32)
Calcium: 8.9 mg/dL (ref 8.9–10.3)
Chloride: 104 mmol/L (ref 98–111)
Creatinine, Ser: 0.91 mg/dL (ref 0.61–1.24)
GFR, Estimated: >60 mL/min (ref >60)
Glucose, Bld: 108 mg/dL — ABNORMAL HIGH (ref 70–99)
Potassium: 3.2 mmol/L — ABNORMAL LOW (ref 3.5–5.1)
Sodium: 136 mmol/L (ref 135–145)

## 2023-05-22 MED ORDER — OXYCODONE HCL 5 MG PO TABS: 5.0000 mg | ORAL_TABLET | Freq: Three times a day (TID) | ORAL | 0 refills | Status: DC | PRN

## 2023-05-22 MED ORDER — MORPHINE SULFATE (PF) 4 MG/ML IV SOLN
4.0000 mg | Freq: Once | INTRAVENOUS | Status: AC
  Administered 2023-05-22: 4 mg via INTRAVENOUS
  Filled 2023-05-22: qty 1

## 2023-05-22 MED ORDER — OXYCODONE HCL 5 MG PO TABS
5.0000 mg | ORAL_TABLET | Freq: Once | ORAL | Status: AC
  Administered 2023-05-22: 5 mg via ORAL
  Filled 2023-05-22: qty 1

## 2023-05-22 MED ORDER — IOHEXOL 300 MG/ML  SOLN
100.0000 mL | Freq: Once | INTRAMUSCULAR | Status: AC | PRN
  Administered 2023-05-22: 100 mL via INTRAVENOUS

## 2023-05-22 MED ORDER — ONDANSETRON HCL 4 MG/2ML IJ SOLN
4.0000 mg | Freq: Once | INTRAMUSCULAR | Status: AC | PRN
  Administered 2023-05-22: 4 mg via INTRAVENOUS
  Filled 2023-05-22: qty 2

## 2023-05-22 NOTE — ED Notes (Signed)
Received report back from float RN.

## 2023-05-22 NOTE — ED Notes (Signed)
Visitor remains at bedside. See triage note. Pt bruised, has abrasions, hematoma to forehead. Clear speech. Pt fatigued.

## 2023-05-22 NOTE — ED Provider Notes (Signed)
[facility] Provider Note    Event Date/Time   First MD Initiated Contact with Patient 05/22/23 1631     (approximate)   History   No chief complaint on file.   HPI  Bruce [name] is a 52 y.o. male   Past medical history of atrial fibrillation not on anticoagulation here for a fall sustained yesterday.  He was trimming trees up on a ladder about 15 feet high when he fell because a tree branch hit his ladder and caused him to fall.  He fell onto a pile of branches.  He struck his head.  He has back pain.  He has right wrist and right ankle pain.  He is able to get up and ambulate and get to bed.  He woke this morning continued to have pain so came to emergency department for evaluation.  He reports no other acute medical complaints or recent illnesses.  No loss of consciousness no blood thinner use.  Independent Historian contributed to assessment above: His partner is at bedside corroborate information given above       Physical Exam   Triage Vital Signs: ED Triage Vitals  Encounter Vitals Group     BP 05/22/23 1607 (!) 115/90     Systolic BP Percentile --      Diastolic BP Percentile --      Pulse Rate 05/22/23 1607 85     Resp 05/22/23 1607 16     Temp 05/22/23 1607 98.7 F (37.1 C)     Temp Source 05/22/23 1607 Oral     SpO2 05/22/23 1607 97 %     Weight 05/22/23 1606 180 lb 1.9 oz (81.7 kg)     Height 05/22/23 1606 5\' 7"  (1.702 m)     Head Circumference --      Peak Flow --      Pain Score 05/22/23 1605 9     Pain Loc --      Pain Education --      Exclude from Growth Chart --     Most recent vital signs: Vitals:   05/22/23 1845 05/22/23 1900  BP:  125/79  Pulse: 77 66  Resp:  16  Temp:    SpO2: 97% 95%    General: Awake, no distress.  CV:  Good peripheral perfusion.  Resp:  Normal effort.  Abd:  No distention.  Other:  Left-sided forehead hematoma.  Neck supple with full range of motion.  Right-sided  lateral/posterior chest wall bruising and flank bruising.  No midline T-spine or L-spine tenderness to palpation.  Abdomen soft and nontender and chest wall anterior looks atraumatic.  He is able to range the hip and knee bilaterally.  He has right-sided ankle swelling and pain to palpation as well as pain to the midfoot on the lateral side of the right side.  He has swelling and pain to palpation of the right wrist. NV intact all extremities.   ED Results / Procedures / Treatments   Labs (all labs ordered are listed, but only abnormal results are displayed) Labs Reviewed  BASIC METABOLIC PANEL - Abnormal; Notable for the following components:      Result Value   Potassium 3.2 (*)    Glucose, Bld 108 (*)    All other components within normal limits  CBC WITH DIFFERENTIAL/PLATELET     I ordered and reviewed the above labs they are notable for normal H&H and creatinine      RADIOLOGY  I independently reviewed and interpreted CT of the head see no obvious bleeding or midline shift   PROCEDURES:  Critical Care performed: No  Procedures   MEDICATIONS ORDERED IN ED: Medications  morphine (PF) 4 MG/ML injection 4 mg (4 mg Intravenous Given 05/22/23 1758)  ondansetron (ZOFRAN) injection 4 mg (4 mg Intravenous Given 05/22/23 1757)  iohexol (OMNIPAQUE) 300 MG/ML solution 100 mL (100 mLs Intravenous Contrast Given 05/22/23 1830)    External physician / consultants:  I spoke with Dr. Ether Griffins of podiatry regarding care plan for this patient.   IMPRESSION / MDM / ASSESSMENT AND PLAN / ED COURSE  I reviewed the triage vital signs and the nursing notes.                                Patient's presentation is most consistent with acute presentation with potential threat to life or bodily function.  Differential diagnosis includes, but is not limited to, blunt traumatic injury including ICH, C-spine fracture dislocation, T or L-spine fracture dislocation, internal bleeding, wrist  fracture dislocation, ankle or foot fracture or dislocation.   The patient is on the cardiac monitor to evaluate for evidence of arrhythmia and/or significant heart rate changes.  MDM:   Concern for blunt traumatic injury fractures dislocations internal bleeding ICH and this otherwise with stable mentating well patient.  Not on blood thinners.  CT scan pan scan head neck T and L-spine as level/chest abdomen pelvis.  Plain films of the right wrist and right ankle.  Pain control with IV morphine.  Disposition pending the results of his imaging.  Patient stable found to have a small medial cuneiform and base of first metatarsal fracture reviewed with Dr. Ether Griffins of podiatry who recommends Ace wrap cam boot and weightbearing as tolerated.  Also with a small minimally displaced radial fracture right wrist, for which she was placed in a sugar-tong splint.  Head and neck skin normal.  Awaiting body scan if unremarkable plan will be for discharge with close podiatry and orthopedics follow-up.       FINAL CLINICAL IMPRESSION(S) / ED DIAGNOSES   Final diagnoses:  Fall, initial encounter  Traumatic injury of head, initial encounter  Right wrist injury, initial encounter  Right ankle injury, initial encounter  Right flank hematoma, initial encounter  Closed fracture of distal end of right radius, unspecified fracture morphology, initial encounter  Closed nondisplaced fracture of medial cuneiform of right foot, initial encounter     Rx / DC Orders   ED Discharge Orders     None        Note:  This document was prepared using Dragon voice recognition software and may include unintentional dictation errors.    Pilar Jarvis, MD 05/22/23 [phone]

## 2023-05-22 NOTE — ED Notes (Signed)
Started to assess for IV placement when pt reported is hard stick and may only be able to get hand stick. Will place [place] IV once [place] machine available.

## 2023-05-22 NOTE — ED Notes (Signed)
All ER techs currently busy and cannot place splint. First tech freed up will complete asap.

## 2023-05-22 NOTE — ED Notes (Signed)
Will place IV once imaging staff done at bedside.

## 2023-05-22 NOTE — Discharge Instructions (Addendum)
Keep your hand in the splint and call bone specialist Dr. Allena Katz for a follow-up appointment.  Do not lift with that hand.  Keep the splint dry.  Keep your foot in the CAM boot, and you may walk on it.  Call Dr. Ether Griffins who is his podiatrist and foot specialist for follow-up appointment regarding the broken bone in your foot.   When you meet with each of these doctors speak to them about your ability to return to work, in the meantime avoid strenuous physical work and lifting especially with the right hand.  Take acetaminophen 650 mg and ibuprofen 400 mg every 6 hours for pain.  Take with food.  Thank you for choosing [place] for your health care today!  Please see your primary doctor this week for a follow up appointment.   If you have any new, worsening, or unexpected symptoms call your doctor right away or come back to the emergency department for reevaluation.  It was my pleasure to care for you today.   Daneil Dan Modesto Charon, MD

## 2023-05-22 NOTE — ED Notes (Signed)
20g IV placed; lt grn and lav tubes obtained and sent to lab.

## 2023-05-22 NOTE — ED Notes (Signed)
Caitlyn NT to complete splint soon.

## 2023-05-22 NOTE — ED Triage Notes (Addendum)
Fall off of a ladder last night.  Fall was around 2030 last night.   States was 1 1/2 stories high. Fell to ground.  C/O pain to right wrist, right ankle and headache.  Abrasion to forehead.  No LOC.  Ambulates into Triage.  Patient declined offer of wheelchair.

## 2023-05-24 ENCOUNTER — Encounter: Payer: Self-pay | Admitting: Infectious Diseases

## 2023-05-24 NOTE — Assessment & Plan Note (Signed)
Mr. 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 Completed 12 weeks of vosevi and now has achieved SVR.   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   Encouraged to stop all alcohol use given liver disease and pancreatitis flares.   He does not need further ID follow up at this time.

## 2023-06-14 ENCOUNTER — Other Ambulatory Visit: Payer: Self-pay

## 2023-06-14 ENCOUNTER — Ambulatory Visit: Payer: Self-pay | Admitting: Gerontology

## 2023-06-14 ENCOUNTER — Other Ambulatory Visit: Payer: Self-pay | Admitting: Medical

## 2023-06-14 ENCOUNTER — Encounter: Payer: Self-pay | Admitting: Gerontology

## 2023-06-14 VITALS — BP 145/92 | HR 70 | Ht 67.0 in | Wt 172.0 lb

## 2023-06-14 DIAGNOSIS — B356 Tinea cruris: Secondary | ICD-10-CM

## 2023-06-14 DIAGNOSIS — M545 Low back pain, unspecified: Secondary | ICD-10-CM

## 2023-06-14 DIAGNOSIS — B353 Tinea pedis: Secondary | ICD-10-CM

## 2023-06-14 DIAGNOSIS — M79671 Pain in right foot: Secondary | ICD-10-CM

## 2023-06-14 DIAGNOSIS — M549 Dorsalgia, unspecified: Secondary | ICD-10-CM | POA: Insufficient documentation

## 2023-06-14 DIAGNOSIS — M25531 Pain in right wrist: Secondary | ICD-10-CM

## 2023-06-14 MED ORDER — MELOXICAM 15 MG PO TABS
15.0000 mg | ORAL_TABLET | Freq: Every day | ORAL | 0 refills | Status: DC
Start: 2023-06-14 — End: 2023-07-11
  Filled 2023-06-14: qty 30, 30d supply, fill #0

## 2023-06-14 MED ORDER — LIDOCAINE 5 % EX OINT
1.0000 | TOPICAL_OINTMENT | CUTANEOUS | 0 refills | Status: DC | PRN
Start: 2023-06-14 — End: 2023-10-12
  Filled 2023-06-14: qty 50, 30d supply, fill #0

## 2023-06-14 MED ORDER — TERBINAFINE HCL 1 % EX CREA
1.0000 | TOPICAL_CREAM | Freq: Two times a day (BID) | CUTANEOUS | 0 refills | Status: AC
Start: 2023-06-14 — End: ?
  Filled 2023-06-14: qty 15, 8d supply, fill #0

## 2023-06-14 NOTE — Progress Notes (Signed)
Established Patient Office Visit  Subjective   [name], male    DOB: 1971/02/12  Age: 53 y.o. MRN: [id number]  Chief Complaint  Patient presents with   Follow-up    HPI Bruce [name] is a 52 y/o male with a PMH of afib (not on anticoag due to chas vas score of 0), hypertension, BPH and prediabetes who presents for hospital follow up visit. He states that he's compliant with his medications, denies side effects and continues to make healthy lifestyle changes. He was seen at the ED on 05/22/23 for a fall sustained 05/21/23. He was trimming trees up on a ladder about 15 feet high when he fell because a tree branch hit his ladder and caused him to fall. He fell onto a pile of branches. He struck his head. Patient stable found to have a small medial cuneiform and base of first metatarsal fracture reviewed with Dr. Ether Griffins of podiatry who recommends Ace wrap cam boot and weightbearing as tolerated. Also with a small minimally displaced radial fracture right wrist, for which she was placed in a sugar-tong splint. Currently, he continues to wear cam boot, experiences intermittent minimal pain with walking.Marland Kitchen  He states that he experiences mild pain when rotating his right forearm internally.  He denies muscle/motor weakness nor paresthesia.  He states that he was unable to follow-up with orthopedic surgery due to not having insurance.  Overall, he states that he is doing well, offers no further complaints, we will follow-up with [facility], Dr Gavin Potters on 07/11/23.  Review of Systems  Constitutional: Negative.   Respiratory: Negative.    Cardiovascular: Negative.   Musculoskeletal:  Positive for back pain.       Right wrist pain, right foot pain and left lower back pain  s/p fall.  Neurological: Negative.   Psychiatric/Behavioral: Negative.        Objective:     BP (!) 145/92   Pulse 70   Ht 5\' 7"  (1.702 m)   Wt 172 lb (78 kg)   SpO2 98%   BMI 26.94 kg/m  BP Readings  from Last 3 Encounters:  06/14/23 (!) 145/92  05/22/23 122/75  05/16/23 134/84   Wt Readings from Last 3 Encounters:  06/14/23 172 lb (78 kg)  05/22/23 180 lb 1.9 oz (81.7 kg)  05/16/23 180 lb 4.8 oz (81.8 kg)      Physical Exam HENT:     Head: Normocephalic and atraumatic.  Cardiovascular:     Rate and Rhythm: Normal rate and regular rhythm.     Pulses: Normal pulses.     Heart sounds: Normal heart sounds.  Pulmonary:     Effort: Pulmonary effort is normal.     Breath sounds: Normal breath sounds.  Musculoskeletal:        General: Tenderness: to right wrist with inward rotation.tenderness to left bruised area to left lower back with palpation..  Skin:    General: Skin is warm.  Neurological:     General: No focal deficit present.     Mental Status: He is alert and oriented to person, place, and time. Mental status is at baseline.  Psychiatric:        Mood and Affect: Mood normal.        Behavior: Behavior normal.        Thought Content: Thought content normal.        Judgment: Judgment normal.      No results found for any visits on  06/14/23.  Last CBC Lab Results  Component Value Date   WBC 5.0 05/22/2023   HGB 15.0 05/22/2023   HCT 42.2 05/22/2023   MCV 86.3 05/22/2023   MCH 30.7 05/22/2023   RDW 13.2 05/22/2023   PLT 217 05/22/2023   Last metabolic panel Lab Results  Component Value Date   GLUCOSE 108 (H) 05/22/2023   NA 136 05/22/2023   K 3.2 (L) 05/22/2023   CL 104 05/22/2023   CO2 25 05/22/2023   BUN 8 05/22/2023   CREATININE 0.91 05/22/2023   GFRNONAA >60 05/22/2023   CALCIUM 8.9 05/22/2023   PHOS 3.2 10/18/2021   PROT 7.7 04/12/2023   ALBUMIN 4.6 02/02/2023   LABGLOB 3.9 02/02/2023   AGRATIO 1.2 02/02/2023   BILITOT 0.7 04/12/2023   ALKPHOS 141 (H) 02/02/2023   AST 48 (H) 04/12/2023   ALT 38 04/12/2023   ANIONGAP 7 05/22/2023   Last lipids Lab Results  Component Value Date   CHOL 145 05/18/2022   HDL 53 05/18/2022   LDLCALC 72  05/18/2022   TRIG 113 05/18/2022   CHOLHDL 2.7 05/18/2022   Last hemoglobin A1c Lab Results  Component Value Date   HGBA1C 6.1 (A) 11/08/2022   Last thyroid functions Lab Results  Component Value Date   TSH 0.219 (L) 12/21/2020   Last vitamin D No results found for: "25OHVITD2", "25OHVITD3", "VD25OH"    The 10-year ASCVD risk score (Arnett DK, et al., 2019) is: 11.8%    Assessment & Plan:   1. Tinea cruris, likely -He will continue current medication, and advised on proper hygiene.  This - terbinafine (LAMISIL) 1 % cream; Apply 1 Application topically 2 (two) times daily.  Dispense: 45 g; Refill: 0  2. Tinea pedis of both feet -He will continue current medication. - terbinafine (LAMISIL) 1 % cream; Apply 1 Application topically 2 (two) times daily.  Dispense: 45 g; Refill: 0  3. Right wrist pain -He was started on meloxicam 15 mg daily, educated on medication side effects and advised to notify clinic.  He will follow-up with Dr. Gavin Potters, and advised to go to the emergency room with worsening symptoms.  Continue wearing his right wrist brace. - meloxicam (MOBIC) 15 MG tablet; Take 1 tablet (15 mg total) by mouth daily.  Dispense: 30 tablet; Refill: 0  4. Right foot pain -He was started on meloxicam 15 mg daily, educated on medication side effects and advised to notify clinic.  He will follow-up with Dr. Gavin Potters, and advised to go to the emergency room with worsening symptoms.  Continue wearing his cam boot. - meloxicam (MOBIC) 15 MG tablet; Take 1 tablet (15 mg total) by mouth daily.  Dispense: 30 tablet; Refill: 0  5. Acute left-sided low back pain, unspecified whether sciatica present -Likely muscle strain due to fall, he was started on lidocaine cream, was advised to go to the emergency room for worsening pain. - lidocaine (XYLOCAINE) 5 % ointment; Apply 1 Application topically as needed.  Dispense: 50 g; Refill: 0   Return in about 27 days (around 07/11/2023), or if  symptoms worsen or fail to improve.    Roxann Vierra Trellis Paganini, NP

## 2023-06-26 ENCOUNTER — Other Ambulatory Visit: Payer: Self-pay

## 2023-07-11 ENCOUNTER — Ambulatory Visit: Payer: Self-pay | Admitting: Rheumatology

## 2023-07-11 ENCOUNTER — Encounter: Payer: Self-pay | Admitting: Gerontology

## 2023-07-11 ENCOUNTER — Other Ambulatory Visit: Payer: Self-pay

## 2023-07-11 ENCOUNTER — Ambulatory Visit: Payer: Self-pay | Admitting: Gerontology

## 2023-07-11 VITALS — BP 131/82 | HR 72 | Wt 173.7 lb

## 2023-07-11 VITALS — BP 131/82 | HR 72 | Wt 173.0 lb

## 2023-07-11 DIAGNOSIS — I480 Paroxysmal atrial fibrillation: Secondary | ICD-10-CM

## 2023-07-11 DIAGNOSIS — S52501G Unspecified fracture of the lower end of right radius, subsequent encounter for closed fracture with delayed healing: Secondary | ICD-10-CM

## 2023-07-11 DIAGNOSIS — M25531 Pain in right wrist: Secondary | ICD-10-CM

## 2023-07-11 DIAGNOSIS — M79671 Pain in right foot: Secondary | ICD-10-CM

## 2023-07-11 MED ORDER — MELOXICAM 15 MG PO TABS
15.0000 mg | ORAL_TABLET | Freq: Every day | ORAL | 0 refills | Status: DC
Start: 2023-07-11 — End: 2023-07-27
  Filled 2023-07-11: qty 30, 30d supply, fill #0

## 2023-07-11 MED ORDER — DILTIAZEM HCL 30 MG PO TABS
30.0000 mg | ORAL_TABLET | Freq: Every day | ORAL | 1 refills | Status: DC | PRN
Start: 2023-07-11 — End: 2023-10-12
  Filled 2023-07-11 – 2023-07-27 (×2): qty 30, 30d supply, fill #0

## 2023-07-11 NOTE — Patient Instructions (Signed)
Right wrist splint issued. Rec fup with orhtopedics

## 2023-07-11 NOTE — Progress Notes (Signed)
Established Patient Office Visit  Subjective   [name], male    DOB: October 09, 1971  Age: 52 y.o. MRN: [id number]  Chief Complaint  Patient presents with   Follow-up    HPI   Bruce [name] is a 52 y/o male with a PMH of afib (not on anticoag due to chas vas score of 0), hypertension, BPH and prediabetes who presents for follow up visit and medication refill. He states that he's compliant with his medications, denies side effects and continues to make healthy lifestyle . He continues to experience intermittent pain to right wrist especially when he uses it, status post fall from a ladder on 05/22/23. He states that pain started after he picked up an object last Thursday. He denies muscle nor motor weakness and paresthesia. He states that pain intensity increases up to 8/10 with activity and he experiences pain when rotating his wrist internally. He states that taking Mobic moderately relieves pain. He was seen by [facility] Rheumatologist Dr Gavin Potters today, he did not follow up with Orthopedics due to not having health insurance, he was issued a right wrist splint and advised to follow up with Orthopedic. He also reports experiencing intermittent nausea last week, denies vomiting,sick contacts  nor any travels. He states that it lasted for 1 day. Overall, he states that he's doing well and offers no further complaint.    Review of Systems  Constitutional: Negative.   Respiratory: Negative.    Cardiovascular: Negative.   Musculoskeletal:  Positive for joint pain (right forearm pain s/p right distal radial fracture due to fall.Marland Kitchen).  Neurological: Negative.   Psychiatric/Behavioral: Negative.        Objective:     BP 131/82 (BP Location: Left Arm, Patient Position: Sitting, Cuff Size: Large)   Pulse 72   Wt 173 lb (78.5 kg)   BMI 27.10 kg/m  BP Readings from Last 3 Encounters:  07/11/23 131/82  07/11/23 131/82  06/14/23 (!) 145/92   Wt Readings from Last 3 Encounters:   07/11/23 173 lb (78.5 kg)  07/11/23 173 lb 11.2 oz (78.8 kg)  06/14/23 172 lb (78 kg)      Physical Exam HENT:     Head: Normocephalic and atraumatic.  Cardiovascular:     Rate and Rhythm: Normal rate.     Pulses: Normal pulses.     Heart sounds: Normal heart sounds.  Pulmonary:     Effort: Pulmonary effort is normal.     Breath sounds: Normal breath sounds.  Musculoskeletal:        General: Tenderness (palpation to radial aspect of right forearm) present.  Skin:    General: Skin is warm.  Neurological:     General: No focal deficit present.     Mental Status: He is alert and oriented to person, place, and time.  Psychiatric:        Mood and Affect: Mood normal.        Behavior: Behavior normal.      No results found for any visits on 07/11/23.  Last CBC Lab Results  Component Value Date   WBC 5.0 05/22/2023   HGB 15.0 05/22/2023   HCT 42.2 05/22/2023   MCV 86.3 05/22/2023   MCH 30.7 05/22/2023   RDW 13.2 05/22/2023   PLT 217 05/22/2023   Last metabolic panel Lab Results  Component Value Date   GLUCOSE 108 (H) 05/22/2023   NA 136 05/22/2023   K 3.2 (L) 05/22/2023   CL 104 05/22/2023  CO2 25 05/22/2023   BUN 8 05/22/2023   CREATININE 0.91 05/22/2023   GFRNONAA >60 05/22/2023   CALCIUM 8.9 05/22/2023   PHOS 3.2 10/18/2021   PROT 7.7 04/12/2023   ALBUMIN 4.6 02/02/2023   LABGLOB 3.9 02/02/2023   AGRATIO 1.2 02/02/2023   BILITOT 0.7 04/12/2023   ALKPHOS 141 (H) 02/02/2023   AST 48 (H) 04/12/2023   ALT 38 04/12/2023   ANIONGAP 7 05/22/2023   Last lipids Lab Results  Component Value Date   CHOL 145 05/18/2022   HDL 53 05/18/2022   LDLCALC 72 05/18/2022   TRIG 113 05/18/2022   CHOLHDL 2.7 05/18/2022   Last hemoglobin A1c Lab Results  Component Value Date   HGBA1C 6.1 (A) 11/08/2022   Last thyroid functions Lab Results  Component Value Date   TSH 0.219 (L) 12/21/2020   Last vitamin D No results found for: "25OHVITD2", "25OHVITD3",  "VD25OH" Last vitamin B12 and Folate No results found for: "VITAMINB12", "FOLATE"    The 10-year ASCVD risk score (Arnett DK, et al., 2019) is: 9.8%    Assessment & Plan:   1. Right wrist pain - He was encouraged to wear right wrist splint constantly, complete [facility] financial application for Orthopedic referral. He will continue on Mobic and advised to notify clinic and go to the ED for worsening symptoms. - meloxicam (MOBIC) 15 MG tablet; Take 1 tablet (15 mg total) by mouth daily.  Dispense: 30 tablet; Refill: 0   2. Paroxysmal atrial fibrillation (HCC) - He will continue current medication, advised to go to the ED for worsening symptoms. - diltiazem (CARDIZEM) 30 MG tablet; Take 1 tablet (30 mg total) by mouth daily as needed. For elevated heart rates  Dispense: 30 tablet; Refill: 1  3. Closed fracture of distal end of right radius with delayed healing, unspecified fracture morphology, subsequent encounter - He was encouraged to complete [facility] [employer] application for referral. - Ambulatory referral to Orthopedic Surgery   Return in about 29 days (around 08/09/2023), or if symptoms worsen or fail to improve.    [name], NP

## 2023-07-11 NOTE — Progress Notes (Signed)
[facility]  PROGRESS NOTE  Patient:Bruce [name] Male   DOB:1970/11/13     52 y.o.  WJX:[id number]  Visit Date: 07/11/2023  HPI:  52 year old African-American male.  Works as a [employer] architect.  On 05/22/2023 he fell off a ladder and broke his midfoot and his right distal radius.  Seen in the emergency room.  Had cast applied by orthopedics.  He went back to work 2 weeks after the cast off unwrapping it.  Did not follow-up with orthopedics.  He is now 6 weeks out.  Having more pain in the right lateral wrist.  No numbness or tingling.  Missed work for 2 days because of pain.  Has not had particular swelling.  Foot is not painful     Past Medical History:  Diagnosis Date   A-fib [facility])    Dental caries    Hepatitis C infection    Iritis    PAF (paroxysmal atrial fibrillation) (HCC)    a. diagnosed 03/19/18; b. CHADS2VASc 0   Polysubstance abuse (HCC)    a. cocaine, tobacco, etoh   Scrotal mass    Tick borne fever    Uveitis    Voiding difficulty 01/11/2023    Past Surgical History:  Procedure Laterality Date   COLONOSCOPY WITH PROPOFOL N/A 07/25/2022   Procedure: COLONOSCOPY WITH PROPOFOL;  Surgeon: Toney Reil, MD;  Location: [facility] ENDOSCOPY;  Service: Gastroenterology;  Laterality: N/A;   FINGER SURGERY     infection, middle left   fractured jaw     SHOULDER SURGERY     right    Social History   Tobacco Use   Smoking status: Former    Current packs/day: 0.00    Types: Cigarettes    Quit date: 12/2021    Years since quitting: 1.5   Smokeless tobacco: Never   Tobacco comments:    Per pt  " I only smoke cigarettes when I drink beer"  Substance Use Topics   Alcohol use: Yes    Comment: 6 pack per week, last use 1 beer end of 10/2022     MEDICATIONS: Current Outpatient Medications  Medication Sig Dispense Refill   acetaminophen (TYLENOL) 325 MG tablet Take 2 tablets (650 mg total) by mouth every 6 (six)  hours as needed for mild pain, headache or fever.     diltiazem (CARDIZEM) 30 MG tablet Take 1 tablet (30 mg total) by mouth daily as needed. For elevated heart rates 30 tablet 1   diltiazem (CARTIA XT) 120 MG 24 hr capsule Take 1 capsule (120 mg total) by mouth daily. 30 capsule 2   lidocaine (XYLOCAINE) 5 % ointment Apply 1 Application topically as needed. 50 g 0   meloxicam (MOBIC) 15 MG tablet Take 1 tablet (15 mg total) by mouth daily. 30 tablet 0   ondansetron (ZOFRAN-ODT) 8 MG disintegrating tablet Take 1 tablet (8 mg total) by mouth every 8 (eight) hours as needed for nausea and vomiting 20 tablet 0   tamsulosin (FLOMAX) 0.4 MG CAPS capsule Take 1 capsule (0.4 mg total) by mouth once daily after breakfast. 30 capsule 2   terbinafine (LAMISIL) 1 % cream Apply 1 Application topically 2 (two) times daily. 45 g 0   No current facility-administered medications for this visit.     ALLERGIES Allergies  Allergen Reactions   Penicillins Other (See Comments)    Did it involve swelling of the face/tongue/throat, SOB, or low BP? No  Did it involve sudden or severe rash/hives, skin peeling, or any reaction on the inside of your mouth or nose? No Did you need to seek medical attention at a hospital or doctor's office? No When did it last happen?       If all above answers are "NO", may proceed with cephalosporin use.     PHYSICAL EXAM: There were no vitals taken for this visit. Pleasant male.  Shoulder moves well.  Elbow moves well.  Right wrist has pain with flexion extension.  Not swollen.  Distal ulna is tender distal radius is only mildly tender.  Has mild decreased grip.  Normal pin in the hand.  Good radial and ulnar pulses.   ASSESSMENT: Right distal radial fracture.  Some distal ulnar pain.  Neurovascularly intact.  Premature cast removal and return to work    PLAN: We issued a right wrist splint.  Best approach would be to follow-up with orthopedics for follow-up x-ray    G.  Al Pimple. MD           07/11/2023,  9:54 AM

## 2023-07-21 ENCOUNTER — Other Ambulatory Visit: Payer: Self-pay

## 2023-07-27 ENCOUNTER — Other Ambulatory Visit: Payer: Self-pay

## 2023-07-27 ENCOUNTER — Other Ambulatory Visit: Payer: Self-pay | Admitting: Gerontology

## 2023-07-27 DIAGNOSIS — M25531 Pain in right wrist: Secondary | ICD-10-CM

## 2023-07-27 MED FILL — Meloxicam Tab 15 MG: ORAL | 30 days supply | Qty: 30 | Fill #0 | Status: AC

## 2023-08-09 ENCOUNTER — Ambulatory Visit: Payer: Self-pay | Admitting: Gerontology

## 2023-08-31 ENCOUNTER — Other Ambulatory Visit: Payer: Self-pay | Admitting: Gerontology

## 2023-08-31 ENCOUNTER — Other Ambulatory Visit: Payer: Self-pay

## 2023-08-31 DIAGNOSIS — M25531 Pain in right wrist: Secondary | ICD-10-CM

## 2023-08-31 DIAGNOSIS — I10 Essential (primary) hypertension: Secondary | ICD-10-CM

## 2023-08-31 DIAGNOSIS — I480 Paroxysmal atrial fibrillation: Secondary | ICD-10-CM

## 2023-08-31 MED ORDER — DILTIAZEM HCL ER COATED BEADS 120 MG PO CP24
120.0000 mg | ORAL_CAPSULE | Freq: Every day | ORAL | 0 refills | Status: DC
Start: 2023-08-31 — End: 2023-10-12
  Filled 2023-08-31: qty 20, 20d supply, fill #0

## 2023-08-31 MED ORDER — MELOXICAM 15 MG PO TABS
15.0000 mg | ORAL_TABLET | Freq: Every day | ORAL | 0 refills | Status: DC
Start: 2023-08-31 — End: 2023-10-12
  Filled 2023-08-31: qty 14, 14d supply, fill #0

## 2023-09-04 ENCOUNTER — Other Ambulatory Visit: Payer: Self-pay

## 2023-09-14 ENCOUNTER — Encounter: Payer: Self-pay | Admitting: *Deleted

## 2023-09-14 ENCOUNTER — Emergency Department: Payer: Self-pay

## 2023-09-14 ENCOUNTER — Other Ambulatory Visit: Payer: Self-pay

## 2023-09-14 ENCOUNTER — Emergency Department
Admission: EM | Admit: 2023-09-14 | Discharge: 2023-09-15 | Disposition: A | Discharge status: Home / Self Care | Payer: Self-pay | Attending: Emergency Medicine | Admitting: Emergency Medicine

## 2023-09-14 ENCOUNTER — Ambulatory Visit: Payer: Self-pay

## 2023-09-14 DIAGNOSIS — I10 Essential (primary) hypertension: Secondary | ICD-10-CM | POA: Insufficient documentation

## 2023-09-14 DIAGNOSIS — J168 Pneumonia due to other specified infectious organisms: Secondary | ICD-10-CM | POA: Insufficient documentation

## 2023-09-14 DIAGNOSIS — Z20822 Contact with and (suspected) exposure to covid-19: Secondary | ICD-10-CM | POA: Insufficient documentation

## 2023-09-14 DIAGNOSIS — J189 Pneumonia, unspecified organism: Secondary | ICD-10-CM

## 2023-09-14 DIAGNOSIS — M791 Myalgia, unspecified site: Secondary | ICD-10-CM | POA: Insufficient documentation

## 2023-09-14 LAB — URINALYSIS, ROUTINE W REFLEX MICROSCOPIC
Bilirubin Urine: NEGATIVE
Glucose, UA: NEGATIVE mg/dL
Hgb urine dipstick: NEGATIVE
Ketones, ur: NEGATIVE mg/dL
Leukocytes,Ua: NEGATIVE
Nitrite: NEGATIVE
Protein, ur: NEGATIVE mg/dL
Specific Gravity, Urine: 1.017 (ref 1.005–1.030)
pH: 7 (ref 5.0–8.0)

## 2023-09-14 LAB — COMPREHENSIVE METABOLIC PANEL
ALT: 18 U/L (ref 0–44)
AST: 26 U/L (ref 15–41)
Albumin: 4.1 g/dL (ref 3.5–5.0)
Alkaline Phosphatase: 86 U/L (ref 38–126)
Anion gap: 11 (ref 5–15)
BUN: 12 mg/dL (ref 6–20)
CO2: 28 mmol/L (ref 22–32)
Calcium: 8.9 mg/dL (ref 8.9–10.3)
Chloride: 95 mmol/L — ABNORMAL LOW (ref 98–111)
Creatinine, Ser: 1.1 mg/dL (ref 0.61–1.24)
GFR, Estimated: >60 mL/min (ref >60)
Glucose, Bld: 106 mg/dL — ABNORMAL HIGH (ref 70–99)
Potassium: 3.6 mmol/L (ref 3.5–5.1)
Sodium: 134 mmol/L — ABNORMAL LOW (ref 135–145)
Total Bilirubin: 0.9 mg/dL (ref <1.2)
Total Protein: 8.5 g/dL — ABNORMAL HIGH (ref 6.5–8.1)

## 2023-09-14 LAB — CBC WITH DIFFERENTIAL/PLATELET
Abs Immature Granulocytes: 0.02 10*3/uL (ref 0.00–0.07)
Basophils Absolute: 0 10*3/uL (ref 0.0–0.1)
Basophils Relative: 0 %
Eosinophils Absolute: 0 10*3/uL (ref 0.0–0.5)
Eosinophils Relative: 0 %
HCT: 43.4 % (ref 39.0–52.0)
Hemoglobin: 15.1 g/dL (ref 13.0–17.0)
Immature Granulocytes: 0 %
Lymphocytes Relative: 28 %
Lymphs Abs: 1.7 10*3/uL (ref 0.7–4.0)
MCH: 29.7 pg (ref 26.0–34.0)
MCHC: 34.8 g/dL (ref 30.0–36.0)
MCV: 85.3 fL (ref 80.0–100.0)
Monocytes Absolute: 0.9 10*3/uL (ref 0.1–1.0)
Monocytes Relative: 15 %
Neutro Abs: 3.4 10*3/uL (ref 1.7–7.7)
Neutrophils Relative %: 57 %
Platelets: 176 10*3/uL (ref 150–400)
RBC: 5.09 MIL/uL (ref 4.22–5.81)
RDW: 12.6 % (ref 11.5–15.5)
WBC: 6.1 10*3/uL (ref 4.0–10.5)
nRBC: 0 % (ref 0.0–0.2)

## 2023-09-14 LAB — RESP PANEL BY RT-PCR (RSV, FLU A&B, COVID)  RVPGX2
Influenza A by PCR: NEGATIVE
Influenza B by PCR: NEGATIVE
Resp Syncytial Virus by PCR: NEGATIVE
SARS Coronavirus 2 by RT PCR: NEGATIVE

## 2023-09-14 LAB — LACTIC ACID, PLASMA: Lactic Acid, Venous: 1 mmol/L (ref 0.5–1.9)

## 2023-09-14 LAB — PROCALCITONIN: Procalcitonin: <0.1 ng/mL

## 2023-09-14 LAB — CK: Total CK: 151 U/L (ref 49–397)

## 2023-09-14 LAB — MAGNESIUM: Magnesium: 2.1 mg/dL (ref 1.7–2.4)

## 2023-09-14 LAB — LIPASE, BLOOD: Lipase: 22 U/L (ref 11–51)

## 2023-09-14 LAB — SARS CORONAVIRUS 2 BY RT PCR: SARS Coronavirus 2 by RT PCR: NEGATIVE

## 2023-09-14 MED ORDER — ACETAMINOPHEN 500 MG PO TABS
1000.0000 mg | ORAL_TABLET | Freq: Once | ORAL | Status: AC
  Administered 2023-09-14: 1000 mg via ORAL
  Filled 2023-09-14: qty 2

## 2023-09-14 MED ORDER — SODIUM CHLORIDE 0.9 % IV BOLUS
1000.0000 mL | Freq: Once | INTRAVENOUS | Status: AC
  Administered 2023-09-14: 1000 mL via INTRAVENOUS

## 2023-09-14 MED ORDER — ONDANSETRON HCL 4 MG/2ML IJ SOLN
4.0000 mg | Freq: Once | INTRAMUSCULAR | Status: AC
  Administered 2023-09-14: 4 mg via INTRAVENOUS
  Filled 2023-09-14: qty 2

## 2023-09-14 MED ORDER — DOXYCYCLINE HYCLATE 100 MG PO TABS
100.0000 mg | ORAL_TABLET | Freq: Two times a day (BID) | ORAL | 0 refills | Status: AC
Start: 2023-09-14 — End: 2023-09-20
  Filled 2023-09-14: qty 10, 5d supply, fill #0

## 2023-09-14 MED ORDER — METOCLOPRAMIDE HCL 5 MG/ML IJ SOLN
10.0000 mg | Freq: Once | INTRAMUSCULAR | Status: AC
  Administered 2023-09-14: 10 mg via INTRAVENOUS
  Filled 2023-09-14: qty 2

## 2023-09-14 MED ORDER — SODIUM CHLORIDE 0.9 % IV SOLN
2.0000 g | Freq: Once | INTRAVENOUS | Status: AC
  Administered 2023-09-14: 2 g via INTRAVENOUS
  Filled 2023-09-14: qty 20

## 2023-09-14 MED ORDER — KETOROLAC TROMETHAMINE 15 MG/ML IJ SOLN
15.0000 mg | Freq: Once | INTRAMUSCULAR | Status: AC
  Administered 2023-09-14: 15 mg via INTRAVENOUS
  Filled 2023-09-14: qty 1

## 2023-09-14 MED ORDER — DIPHENHYDRAMINE HCL 50 MG/ML IJ SOLN
12.5000 mg | Freq: Once | INTRAMUSCULAR | Status: AC
  Administered 2023-09-14: 12.5 mg via INTRAVENOUS
  Filled 2023-09-14: qty 1

## 2023-09-14 MED ORDER — CEFUROXIME AXETIL 500 MG PO TABS
500.0000 mg | ORAL_TABLET | Freq: Two times a day (BID) | ORAL | 0 refills | Status: AC
  Filled 2023-09-14: qty 10, 5d supply, fill #0

## 2023-09-14 NOTE — ED Triage Notes (Signed)
Pt reports cough, fever, congestion bodyaches.  Sx for 6 days.  Pt alert  speech clear.

## 2023-09-14 NOTE — Discharge Instructions (Addendum)
At this time your workup is suggestive that this is most likely viral in nature but given you have had symptoms for 6 days now we discussed pros and cons of antibiotics and have started you for possible pneumonia that is not being seen on the x-ray.  You can take Tylenol 1 g every 8 hours, ibuprofen 600 every 6 hours with food to help with pain and the fevers.  If you develop worsening symptoms she should return to the ER for repeat evaluation.

## 2023-09-14 NOTE — ED Provider Notes (Signed)
[facility] Provider Note    Event Date/Time   First MD Initiated Contact with Patient 09/14/23 1816     (approximate)   History   Generalized Body Aches   HPI  Bruce [name] is a 52 y.o. male with history of paroxysmal A-fib not on anticoagulation, hypertension who comes in with concerns for fevers.  Patient reports not well for the past 6 days.  He reports generalized bodyaches all over her, coughing, congestion, vomiting.  He denies hurting in 1 specific area to states that his whole body hurts.  He has not taken anything yet today to help with the symptomsexcept alksa-seltzer.  He denies any urinary symptoms.  Physical Exam   Triage Vital Signs: ED Triage Vitals  Encounter Vitals Group     BP 09/14/23 1617 (!) 123/110     Systolic BP Percentile --      Diastolic BP Percentile --      Pulse Rate 09/14/23 1617 91     Resp 09/14/23 1617 20     Temp 09/14/23 1617 (!) 100.5 F (38.1 C)     Temp Source 09/14/23 1617 Oral     SpO2 09/14/23 1617 98 %     Weight 09/14/23 1615 165 lb (74.8 kg)     Height 09/14/23 1615 5\' 7"  (1.702 m)     Head Circumference --      Peak Flow --      Pain Score 09/14/23 1615 8     Pain Loc --      Pain Education --      Exclude from Growth Chart --     Most recent vital signs: Vitals:   09/14/23 1617  BP: (!) 123/110  Pulse: 91  Resp: 20  Temp: (!) 100.5 F (38.1 C)  SpO2: 98%     General: Awake, no distress.  CV:  Good peripheral perfusion.  Resp:  Normal effort.  Abd:  No distention.  Soft and nontender Other:  No swelling in legs.   ED Results / Procedures / Treatments   Labs (all labs ordered are listed, but only abnormal results are displayed) Labs Reviewed  COMPREHENSIVE METABOLIC PANEL - Abnormal; Notable for the following components:      Result Value   Sodium 134 (*)    Chloride 95 (*)    Glucose, Bld 106 (*)    Total Protein 8.5 (*)    All other components within normal limits   URINALYSIS, ROUTINE W REFLEX MICROSCOPIC - Abnormal; Notable for the following components:   Color, Urine YELLOW (*)    APPearance CLEAR (*)    All other components within normal limits  SARS CORONAVIRUS 2 BY RT PCR  RESP PANEL BY RT-PCR (RSV, FLU A&B, COVID)  RVPGX2  CULTURE, BLOOD (ROUTINE X 2)  CULTURE, BLOOD (ROUTINE X 2)  LACTIC ACID, PLASMA  PROCALCITONIN  CBC WITH DIFFERENTIAL/PLATELET  MAGNESIUM  LIPASE, BLOOD  CK  LACTIC ACID, PLASMA      RADIOLOGY I have reviewed the xray personally and interpreted no evidence of any pneumonia    PROCEDURES:  Critical Care performed: No  Procedures   MEDICATIONS ORDERED IN ED: Medications  acetaminophen (TYLENOL) tablet 1,000 mg (has no administration in time range)  sodium chloride 0.9 % bolus 1,000 mL (has no administration in time range)  ondansetron (ZOFRAN) injection 4 mg (has no administration in time range)     IMPRESSION / MDM / ASSESSMENT AND PLAN / ED COURSE  I reviewed the triage vital signs and the nursing notes.   Patient's presentation is most consistent with acute presentation with potential threat to life or bodily function.   Differential includes flu, COVID, RSV, pneumonia, UTI.  Will give patient symptomatic treatment and begin workup with lab work, blood cultures, lactate to rule out bacteremia, sepsis.  UA without evidence of UTI.  Procalcitonin was negative.  CBC was normal.  CMP was reassuring.  Lipase normal.  COVID, flu was negative.  On repeat assessment he reports some upper abdominal discomfort and some headaches.  Will add on imaging to evaluate for these.  He denies being immunosuppressed or having history of herpes to suggest meningitis and given his multiple symptoms that seems more likely just viral in nature.  I will add on some medications to help with his headache.  Does not really seem to have any neck stiffness.  Patient does not meet sepsis criteria he did elevated temperature but  otherwise with his reassuring blood work does not meet sepsis especially with negative procalcitonin this is reassuring that this is most likely viral in nature.  11:01 PM reevaluated patient he reports feeling better.  At this time I do not have a source for his fever but given the negative procalcitonin we discussed that is most likely viral but given he has been having symptoms for 6 days we discussed covering him with antibiotics for covering for a potential pneumonia that not being seen on the x-ray given his cough, congestion etc.  He denies any tick bites, recent travel out of [place], infections of the skin on his feet, denies any IV drug use.  We discussed the possibility of meningitis but he has no neck stiffness and given the multitude of other symptoms it seems less likely.  We discussed lumbar puncture pros and cons and he is opted to decline this at this time.  We discussed that his blood cultures are still pending and that he needs to leave his phone on him and we will give him a dose of ceftriaxone to cover him for 24 hours while these are pending but at this time it seems less likely to be related to bacteremia given he is otherwise not meeting sepsis criteria.    The patient is on the cardiac monitor to evaluate for evidence of arrhythmia and/or significant heart rate changes.      FINAL CLINICAL IMPRESSION(S) / ED DIAGNOSES   Final diagnoses:  Pneumonia due to infectious organism, unspecified laterality, unspecified part of lung  Myalgia     Rx / DC Orders   ED Discharge Orders          Ordered    doxycycline (VIBRA-TABS) 100 MG tablet  2 times daily        09/14/23 2304    cefUROXime (CEFTIN) 500 MG tablet  2 times daily with meals        09/14/23 2304             Note:  This document was prepared using Dragon voice recognition software and may include unintentional dictation errors.   Concha Se, MD 09/14/23 [phone]

## 2023-09-15 ENCOUNTER — Other Ambulatory Visit: Payer: Self-pay

## 2023-09-15 LAB — BLOOD CULTURE ID PANEL (REFLEXED) - BCID2

## 2023-09-17 LAB — CULTURE, BLOOD (ROUTINE X 2): Special Requests: ADEQUATE

## 2023-09-18 ENCOUNTER — Telehealth: Payer: Self-pay

## 2023-09-18 NOTE — Telephone Encounter (Signed)
Encounter created by mistake

## 2023-09-19 LAB — CULTURE, BLOOD (ROUTINE X 2)
Culture: NO GROWTH
Special Requests: ADEQUATE

## 2023-10-02 ENCOUNTER — Other Ambulatory Visit: Payer: Self-pay

## 2023-10-02 ENCOUNTER — Other Ambulatory Visit: Payer: Self-pay | Admitting: Gerontology

## 2023-10-02 DIAGNOSIS — I480 Paroxysmal atrial fibrillation: Secondary | ICD-10-CM

## 2023-10-02 DIAGNOSIS — M25531 Pain in right wrist: Secondary | ICD-10-CM

## 2023-10-02 DIAGNOSIS — I10 Essential (primary) hypertension: Secondary | ICD-10-CM

## 2023-10-02 DIAGNOSIS — Z87438 Personal history of other diseases of male genital organs: Secondary | ICD-10-CM

## 2023-10-03 ENCOUNTER — Other Ambulatory Visit: Payer: Self-pay | Admitting: Gerontology

## 2023-10-03 ENCOUNTER — Other Ambulatory Visit: Payer: Self-pay

## 2023-10-03 DIAGNOSIS — Z87438 Personal history of other diseases of male genital organs: Secondary | ICD-10-CM

## 2023-10-03 DIAGNOSIS — I480 Paroxysmal atrial fibrillation: Secondary | ICD-10-CM

## 2023-10-03 DIAGNOSIS — M25531 Pain in right wrist: Secondary | ICD-10-CM

## 2023-10-03 DIAGNOSIS — I10 Essential (primary) hypertension: Secondary | ICD-10-CM

## 2023-10-04 ENCOUNTER — Other Ambulatory Visit: Payer: Self-pay

## 2023-10-08 ENCOUNTER — Other Ambulatory Visit: Payer: Self-pay

## 2023-10-09 ENCOUNTER — Other Ambulatory Visit: Payer: Self-pay

## 2023-10-10 ENCOUNTER — Other Ambulatory Visit: Payer: Self-pay

## 2023-10-12 ENCOUNTER — Other Ambulatory Visit: Payer: Self-pay

## 2023-10-12 ENCOUNTER — Ambulatory Visit: Payer: Self-pay | Admitting: Gerontology

## 2023-10-12 VITALS — BP 143/91 | HR 62 | Temp 97.9°F | Resp 18 | Ht 67.0 in | Wt 168.2 lb

## 2023-10-12 DIAGNOSIS — Z87438 Personal history of other diseases of male genital organs: Secondary | ICD-10-CM

## 2023-10-12 DIAGNOSIS — I10 Essential (primary) hypertension: Secondary | ICD-10-CM

## 2023-10-12 DIAGNOSIS — M545 Low back pain, unspecified: Secondary | ICD-10-CM

## 2023-10-12 DIAGNOSIS — M25531 Pain in right wrist: Secondary | ICD-10-CM

## 2023-10-12 DIAGNOSIS — I480 Paroxysmal atrial fibrillation: Secondary | ICD-10-CM

## 2023-10-12 MED ORDER — MELOXICAM 15 MG PO TABS
15.0000 mg | ORAL_TABLET | Freq: Every day | ORAL | 0 refills | Status: DC
  Filled 2023-10-12: qty 30, 30d supply, fill #0

## 2023-10-12 MED ORDER — DILTIAZEM HCL ER COATED BEADS 120 MG PO CP24
120.0000 mg | ORAL_CAPSULE | Freq: Every day | ORAL | 2 refills | Status: DC
  Filled 2023-10-12: qty 30, 30d supply, fill #0
  Filled 2023-11-17: qty 30, 30d supply, fill #1
  Filled 2023-12-18: qty 30, 30d supply, fill #2

## 2023-10-12 MED ORDER — DILTIAZEM HCL 30 MG PO TABS
30.0000 mg | ORAL_TABLET | Freq: Every day | ORAL | 2 refills | Status: DC | PRN
  Filled 2023-10-12: qty 30, 30d supply, fill #0
  Filled 2023-11-17: qty 30, 30d supply, fill #1
  Filled 2023-12-18 (×2): qty 15, 15d supply, fill #1
  Filled 2023-12-18: qty 30, 30d supply, fill #1
  Filled 2024-01-18: qty 30, 30d supply, fill #2

## 2023-10-12 MED ORDER — TAMSULOSIN HCL 0.4 MG PO CAPS
0.4000 mg | ORAL_CAPSULE | Freq: Every day | ORAL | 2 refills | Status: DC
  Filled 2023-10-12: qty 30, 30d supply, fill #0
  Filled 2023-11-17: qty 30, 30d supply, fill #1
  Filled 2023-12-18: qty 30, 30d supply, fill #2

## 2023-10-12 MED ORDER — LIDOCAINE 5 % EX OINT
1.0000 | TOPICAL_OINTMENT | CUTANEOUS | 0 refills | Status: AC | PRN
  Filled 2023-10-12: qty 50, 30d supply, fill #0

## 2023-10-12 NOTE — Progress Notes (Signed)
Established Patient Office Visit  Subjective   [name], male    DOB: [birth year]  Age: 52 y.o. MRN: [id number]  Chief Complaint  Patient presents with   Follow-up    HPI  Bruce [name] is a 52 y/o male with a PMH of afib (not on anticoag due to chas vas score of 0), hypertension, BPH and prediabetes who presents for follow up visit and medication refill . He was seen at the ED on 09/14/23 for generalized body ache, was treated for Pneumonia due to infectious organism with Doxycycline 100 mg bid and Cefuroxime 500 mg bid. Currently, he continues to c/o right wrist pain s/p fall from a tree in July of 2024. 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 He endores mild weakness, and unable to pick up heavy objects. He states that using riht hand aggravates pain and taking Meloxicam moderately relieves symptoms.  He continues to wear his wrist brace.Dr Gavin Potters saw him on 07/11/23 and stated that the Best approach would be to follow-up with orthopedics for follow-up x-ray. Overall, he states that he is doing well and offers no further complaints.  Review of Systems  Constitutional: Negative.   Respiratory: Negative.    Cardiovascular: Negative.   Musculoskeletal:  Positive for joint pain (lateral aspect of right wrist pain).  Neurological: Negative.   Psychiatric/Behavioral: Negative.        Objective:     BP (!) 143/91   Pulse 62   Temp 97.9 F (36.6 C)   Resp 18   Ht 5\' 7"  (1.702 m)   Wt 168 lb 3.2 oz (76.3 kg)   SpO2 97%   BMI 26.34 kg/m  BP Readings from Last 3 Encounters:  10/12/23 (!) 143/91  09/15/23 120/68  07/11/23 131/82   Wt Readings from Last 3 Encounters:  10/12/23 168 lb 3.2 oz (76.3 kg)  09/14/23 165 lb (74.8 kg)  07/11/23 173 lb (78.5 kg)      Physical Exam HENT:     Head: Normocephalic and atraumatic.  Cardiovascular:     Rate and Rhythm: Normal rate and regular rhythm.     Pulses: Normal pulses.      Heart sounds: Normal heart sounds.  Pulmonary:     Effort: Pulmonary effort is normal.     Breath sounds: Normal breath sounds.  Musculoskeletal:        General: Tenderness (palpation to lateral aspect of right wrist) present.  Skin:    General: Skin is warm.  Neurological:     General: No focal deficit present.     Mental Status: He is alert and oriented to person, place, and time.  Psychiatric:        Mood and Affect: Mood normal.        Behavior: Behavior normal.        Thought Content: Thought content normal.        Judgment: Judgment normal.      No results found for any visits on 10/12/23.  Last CBC Lab Results  Component Value Date   WBC 6.1 09/14/2023   HGB 15.1 09/14/2023   HCT 43.4 09/14/2023   MCV 85.3 09/14/2023   MCH 29.7 09/14/2023   RDW 12.6 09/14/2023   PLT 176 09/14/2023   Last metabolic panel Lab Results  Component Value Date   GLUCOSE 106 (H) 09/14/2023   NA 134 (L) 09/14/2023   K 3.6 09/14/2023   CL 95 (L)  09/14/2023   CO2 28 09/14/2023   BUN 12 09/14/2023   CREATININE 1.10 09/14/2023   GFRNONAA >60 09/14/2023   CALCIUM 8.9 09/14/2023   PHOS 3.2 10/18/2021   PROT 8.5 (H) 09/14/2023   ALBUMIN 4.1 09/14/2023   LABGLOB 3.9 02/02/2023   AGRATIO 1.2 02/02/2023   BILITOT 0.9 09/14/2023   ALKPHOS 86 09/14/2023   AST 26 09/14/2023   ALT 18 09/14/2023   ANIONGAP 11 09/14/2023   Last lipids Lab Results  Component Value Date   CHOL 145 05/18/2022   HDL 53 05/18/2022   LDLCALC 72 05/18/2022   TRIG 113 05/18/2022   CHOLHDL 2.7 05/18/2022   Last hemoglobin A1c Lab Results  Component Value Date   HGBA1C 6.1 (A) 11/08/2022   Last thyroid functions Lab Results  Component Value Date   TSH 0.219 (L) 12/21/2020   Last vitamin D No results found for: "25OHVITD2", "25OHVITD3", "VD25OH" Last vitamin B12 and Folate No results found for: "VITAMINB12", "FOLATE"    The 10-year ASCVD risk score (Arnett DK, et al., 2019) is: 11.5%     Assessment & Plan:   1. Paroxysmal atrial fibrillation (HCC) -He will continue current medication, denies palpitation.  He was advised to go to the emergency room for worsening symptoms. - diltiazem (CARDIZEM) 30 MG tablet; Take 1 tablet (30 mg total) by mouth daily as needed. For elevated heart rates  Dispense: 30 tablet; Refill: 2 - diltiazem (CARTIA XT) 120 MG 24 hr capsule; Take 1 capsule (120 mg total) by mouth daily.  Dispense: 30 capsule; Refill: 2  2. Essential (primary) hypertension -Blood pressure is improving, he will continue current medication, DASH diet and exercise as tolerated. - diltiazem (CARTIA XT) 120 MG 24 hr capsule; Take 1 capsule (120 mg total) by mouth daily.  Dispense: 30 capsule; Refill: 2   3. Right wrist pain -He was advised to continue wearing the right wrist brace, apply lidocaine and take meloxicam as needed for pain.  He was encouraged to complete: Financial application for referral to orthopedic. - lidocaine (XYLOCAINE) 5 % ointment; Apply 1 Application topically as needed.  Dispense: 50 g; Refill: 0 - meloxicam (MOBIC) 15 MG tablet; Take 1 tablet (15 mg total) by mouth daily.  Dispense: 30 tablet; Refill: 0 - Ambulatory referral to Orthopedic Surgery  4. History of BPH -He will continue current medication. - tamsulosin (FLOMAX) 0.4 MG CAPS capsule; Take 1 capsule (0.4 mg total) by mouth once daily after breakfast.  Dispense: 30 capsule; Refill: 2   Return in about 5 weeks (around 11/16/2023), or if symptoms worsen or fail to improve.    Bruce [name] Bruce Paganini, NP

## 2023-10-12 NOTE — Patient Instructions (Signed)

## 2023-10-13 ENCOUNTER — Other Ambulatory Visit: Payer: Self-pay

## 2023-11-16 ENCOUNTER — Ambulatory Visit: Payer: Self-pay

## 2023-11-17 ENCOUNTER — Other Ambulatory Visit: Payer: Self-pay

## 2023-11-17 ENCOUNTER — Other Ambulatory Visit: Payer: Self-pay | Admitting: Gerontology

## 2023-11-17 DIAGNOSIS — M25531 Pain in right wrist: Secondary | ICD-10-CM

## 2023-11-20 ENCOUNTER — Other Ambulatory Visit: Payer: Self-pay

## 2023-11-21 ENCOUNTER — Other Ambulatory Visit: Payer: Self-pay

## 2023-11-21 MED FILL — Meloxicam Tab 15 MG: ORAL | 10 days supply | Qty: 10 | Fill #0 | Status: CN

## 2023-11-22 ENCOUNTER — Other Ambulatory Visit: Payer: Self-pay

## 2023-12-05 ENCOUNTER — Other Ambulatory Visit: Payer: Self-pay

## 2023-12-18 ENCOUNTER — Other Ambulatory Visit: Payer: Self-pay

## 2023-12-18 MED FILL — Meloxicam Tab 15 MG: ORAL | 10 days supply | Qty: 10 | Fill #0 | Status: AC

## 2023-12-19 ENCOUNTER — Other Ambulatory Visit: Payer: Self-pay

## 2023-12-21 ENCOUNTER — Other Ambulatory Visit: Payer: Self-pay

## 2024-01-03 ENCOUNTER — Other Ambulatory Visit: Payer: Self-pay

## 2024-01-18 ENCOUNTER — Ambulatory Visit: Payer: Self-pay

## 2024-01-18 ENCOUNTER — Other Ambulatory Visit: Payer: Self-pay

## 2024-01-22 ENCOUNTER — Other Ambulatory Visit: Payer: Self-pay | Admitting: Gerontology

## 2024-01-22 ENCOUNTER — Other Ambulatory Visit: Payer: Self-pay

## 2024-01-22 DIAGNOSIS — I480 Paroxysmal atrial fibrillation: Secondary | ICD-10-CM

## 2024-01-22 DIAGNOSIS — I10 Essential (primary) hypertension: Secondary | ICD-10-CM

## 2024-01-23 ENCOUNTER — Other Ambulatory Visit: Payer: Self-pay

## 2024-01-23 MED FILL — Diltiazem HCl Coated Beads Cap ER 24HR 120 MG: ORAL | 14 days supply | Qty: 14 | Fill #0 | Status: AC

## 2024-01-24 ENCOUNTER — Other Ambulatory Visit: Payer: Self-pay

## 2024-01-25 ENCOUNTER — Other Ambulatory Visit: Payer: Self-pay

## 2024-02-01 ENCOUNTER — Ambulatory Visit: Payer: Self-pay | Admitting: Gerontology

## 2024-02-01 VITALS — BP 135/85 | HR 77 | Temp 97.5°F | Resp 18 | Wt 177.7 lb

## 2024-02-01 DIAGNOSIS — Z87438 Personal history of other diseases of male genital organs: Secondary | ICD-10-CM

## 2024-02-01 DIAGNOSIS — I480 Paroxysmal atrial fibrillation: Secondary | ICD-10-CM

## 2024-02-01 DIAGNOSIS — I10 Essential (primary) hypertension: Secondary | ICD-10-CM

## 2024-02-01 DIAGNOSIS — N529 Male erectile dysfunction, unspecified: Secondary | ICD-10-CM | POA: Insufficient documentation

## 2024-02-01 MED ORDER — DILTIAZEM HCL 30 MG PO TABS
30.0000 mg | ORAL_TABLET | Freq: Every day | ORAL | 2 refills | Status: AC | PRN
  Filled 2024-02-01 – 2024-06-20 (×3): qty 30, 30d supply, fill #0
  Filled 2024-09-24: qty 30, 30d supply, fill #1
  Filled 2024-12-12 (×2): qty 30, 30d supply, fill #2

## 2024-02-01 MED ORDER — DILTIAZEM HCL ER COATED BEADS 120 MG PO CP24
120.0000 mg | ORAL_CAPSULE | Freq: Every day | ORAL | 2 refills | Status: DC
  Filled 2024-02-01: qty 30, 30d supply, fill #0
  Filled 2024-03-12: qty 30, 30d supply, fill #1
  Filled 2024-04-16: qty 30, 30d supply, fill #2

## 2024-02-01 MED ORDER — TAMSULOSIN HCL 0.4 MG PO CAPS
0.4000 mg | ORAL_CAPSULE | Freq: Every day | ORAL | 2 refills | Status: DC
  Filled 2024-02-01: qty 30, 30d supply, fill #0
  Filled 2024-03-12: qty 30, 30d supply, fill #1

## 2024-02-01 NOTE — Progress Notes (Deleted)
 Established Patient Office Visit  Subjective   [name], male    DOB: 1971-05-03  Age: 53 y.o. MRN: [id number]  No chief complaint on file.   HPI  Bruce [name] is a 53 y/o male with a PMH of afib (not on anticoag due to chas vas score of 0), hypertension, BPH and prediabetes who presents for follow up visit and medication refill .    Patient Active Problem List   Diagnosis Date Noted   Right wrist pain 06/14/2023   Right foot pain 06/14/2023   Back pain 06/14/2023   Swollen scrotum 05/16/2023   Right elbow pain 05/16/2023   Right anterior knee pain 05/16/2023   Chipped tooth 05/16/2023   Tinea pedis of both feet 05/16/2023   Prediabetes 01/12/2022   Nausea & vomiting 01/12/2022   Encounter to establish care 12/09/2021   Essential hypertension 12/09/2021   History of BPH 12/09/2021   Headache 12/09/2021   Underinsured 06/29/2021   BPH (benign prostatic hyperplasia) 12/23/2020   Atrial fibrillation with RVR (HCC) 12/21/2020   Alcohol-induced pancreatitis, 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   Recurrent pancreatitis 06/29/2020   Alcohol induced acute pancreatitis without necrosis or infection 05/03/2020   Polysubstance abuse (HCC)    Hypokalemia    Alcohol abuse    Acute pancreatitis 01/12/2020   Thrombocytopenia (HCC) 06/25/2019   Elevated troponin    Pancreatitis 06/23/2019   AF (paroxysmal atrial fibrillation) (HCC) 09/13/2018   Atrial fibrillation (HCC) 03/19/2018   Abnormality of gait 02/12/2013   Scrotal mass 05/22/2012   Dental caries 05/22/2012   Hepatitis C virus infection cured after antiviral drug therapy 05/16/2012   Arthralgia 05/06/2012   Neutropenia, unspecified (HCC) 05/01/2012   Spermatocele of epididymis 05/01/2012   Bilateral varicoceles 05/01/2012   Bilateral hydrocele 05/01/2012   Past Medical History:  Diagnosis Date   A-fib [facility])    Dental caries    Hepatitis C infection    Iritis    PAF (paroxysmal atrial fibrillation) (HCC)     a. diagnosed 03/19/18; b. CHADS2VASc 0   Polysubstance abuse (HCC)    a. cocaine, tobacco, etoh   Scrotal mass    Tick borne fever    Uveitis    Voiding difficulty 01/11/2023   Past Surgical History:  Procedure Laterality Date   COLONOSCOPY WITH PROPOFOL N/A 07/25/2022   Procedure: COLONOSCOPY WITH PROPOFOL;  Surgeon: Toney Reil, MD;  Location: [facility] ENDOSCOPY;  Service: Gastroenterology;  Laterality: N/A;   FINGER SURGERY     infection, middle left   fractured jaw     SHOULDER SURGERY     right   Social History   Tobacco Use   Smoking status: Former    Current packs/day: 0.00    Types: Cigarettes    Quit date: 12/2021    Years since quitting: 2.1   Smokeless tobacco: Never   Tobacco comments:    Per pt  " I only smoke cigarettes when I drink beer"  Vaping Use   Vaping status: Never Used  Substance Use Topics   Alcohol use: Yes    Comment: 6 pack per week, last use 1 beer end of 10/2022   Drug use: Not Currently    Types: IV, Cocaine    Comment: Pt states that he last used "a while back" "several months ago"   Social History   Socioeconomic History   Marital status: Single    Spouse name: Not on file   Number of children: 0  Years of education: Not on file   Highest education level: Not on file  Occupational History    Employer: UNEMPLOYE  Tobacco Use   Smoking status: Former    Current packs/day: 0.00    Types: Cigarettes    Quit date: 12/2021    Years since quitting: 2.1   Smokeless tobacco: Never   Tobacco comments:    Per pt  " I only smoke cigarettes when I drink beer"  Vaping Use   Vaping status: Never Used  Substance and Sexual Activity   Alcohol use: Yes    Comment: 6 pack per week, last use 1 beer end of 10/2022   Drug use: Not Currently    Types: IV, Cocaine    Comment: Pt states that he last used "a while back" "several months ago"   Sexual activity: Not on file  Other Topics Concern   Not on file  Social History Narrative    Lives in [place]; with fiance. 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; cleaning foreclosed houses;    Social Drivers of Health   Financial Resource Strain: Medium Risk (06/14/2023)   Overall Financial Resource Strain (CARDIA)    Difficulty of Paying Living Expenses: Somewhat hard  Food Insecurity: No Food Insecurity (02/02/2023)   Hunger Vital Sign    Worried About Running Out of Food in the Last Year: Never true    Ran Out of Food in the Last Year: Never true  Transportation Needs: No Transportation Needs (02/02/2023)   PRAPARE - Administrator, Civil Service (Medical): No    Lack of Transportation (Non-Medical): No  Physical Activity: Sufficiently Active (06/14/2023)   Exercise Vital Sign    Days of Exercise per Week: 7 days    Minutes of Exercise per Session: 60 min  Stress: Stress Concern Present (06/14/2023)   [employer] of Occupational Health - Occupational Stress Questionnaire    Feeling of Stress : To some extent  Social Connections: Socially Isolated (06/14/2023)   Social Connection and Isolation Panel [NHANES]    Frequency of Communication with Friends and Family: Never    Frequency of Social Gatherings with Friends and Family: Never    Attends Religious Services: Never    Database administrator or Organizations: No    Attends Banker Meetings: Never    Marital Status: Divorced  Catering manager Violence: Not At Risk (02/02/2023)   Humiliation, Afraid, Rape, and Kick questionnaire    Fear of Current or Ex-Partner: No    Emotionally Abused: No    Physically Abused: No    Sexually Abused: No   Family History  Problem Relation Age of Onset   Schizophrenia Mother    Drug abuse Father        throat cancer   Other Maternal Grandmother        unknown medical history   Alzheimer's disease Maternal Grandfather    Cancer Paternal Grandmother    Other Paternal Grandfather        unknown medical history   Allergies  Allergen Reactions   Penicillins  Other (See Comments)    Did it involve swelling of the face/tongue/throat, SOB, or low BP? No Did it involve sudden or severe rash/hives, skin peeling, or any reaction on the inside of your mouth or nose? No Did you need to seek medical attention at a hospital or doctor's office? No When did it last happen?       If all above answers are "  NO", may proceed with cephalosporin use.      ROS    Objective:     There were no vitals taken for this visit. BP Readings from Last 3 Encounters:  10/12/23 (!) 143/91  09/15/23 120/68  07/11/23 131/82   Wt Readings from Last 3 Encounters:  10/12/23 168 lb 3.2 oz (76.3 kg)  09/14/23 165 lb (74.8 kg)  07/11/23 173 lb (78.5 kg)      Physical Exam   No results found for any visits on 02/01/24.  Last CBC Lab Results  Component Value Date   WBC 6.1 09/14/2023   HGB 15.1 09/14/2023   HCT 43.4 09/14/2023   MCV 85.3 09/14/2023   MCH 29.7 09/14/2023   RDW 12.6 09/14/2023   PLT 176 09/14/2023   Last metabolic panel Lab Results  Component Value Date   GLUCOSE 106 (H) 09/14/2023   NA 134 (L) 09/14/2023   K 3.6 09/14/2023   CL 95 (L) 09/14/2023   CO2 28 09/14/2023   BUN 12 09/14/2023   CREATININE 1.10 09/14/2023   GFRNONAA >60 09/14/2023   CALCIUM 8.9 09/14/2023   PHOS 3.2 10/18/2021   PROT 8.5 (H) 09/14/2023   ALBUMIN 4.1 09/14/2023   LABGLOB 3.9 02/02/2023   AGRATIO 1.2 02/02/2023   BILITOT 0.9 09/14/2023   ALKPHOS 86 09/14/2023   AST 26 09/14/2023   ALT 18 09/14/2023   ANIONGAP 11 09/14/2023   Last lipids Lab Results  Component Value Date   CHOL 145 05/18/2022   HDL 53 05/18/2022   LDLCALC 72 05/18/2022   TRIG 113 05/18/2022   CHOLHDL 2.7 05/18/2022   Last hemoglobin A1c Lab Results  Component Value Date   HGBA1C 6.1 (A) 11/08/2022   Last thyroid functions Lab Results  Component Value Date   TSH 0.219 (L) 12/21/2020   Last vitamin D No results found for: "25OHVITD2", "25OHVITD3", "VD25OH" Last vitamin B12  and Folate No results found for: "VITAMINB12", "FOLATE"    The 10-year ASCVD risk score (Arnett DK, et al., 2019) is: 11.5%    Assessment & Plan:   Problem List Items Addressed This Visit   None   No follow-ups on file.    Emilynn Srinivasan Trellis Paganini, NP

## 2024-02-01 NOTE — Progress Notes (Signed)
 Established Patient Office Visit  Subjective   [name], male    DOB: 07/16/1971  Age: 53 y.o. MRN: [id number]  No chief complaint on file.   HPI Zadrian Mccauley is a 53 y/o male with a PMH of afib (not on anticoag due to chas vas score of 0), hypertension, BPH and prediabetes who presents for follow up visit and medication refill. The patient reports that he is compliant and taking all his medications as prescribed. He also reports checking his blood pressure at home but did not bring a log to the appointment. Patient denies any headaches, dizziness, palpitations or shortness of breath. Patient also reports right wrist pain from previous injury. He had an ortho referral and he states he still planning to set up that appointment. Patient also reports that he has low sex drive which interfering with his relationship. He endorses difficulty achieving and maintaining erection. He denies penile pain and discharge. Overall, he states that he's doing well and offers no further complaint.  Review of Systems  Constitutional: Negative.   Eyes: Negative.   Respiratory: Negative.    Cardiovascular: Negative.   Gastrointestinal: Negative.   Genitourinary: Negative.   Musculoskeletal:  Positive for joint pain (right wrist pain).  Skin: Negative.   Neurological: Negative.   Psychiatric/Behavioral: Negative.        Objective:     BP 135/85 (BP Location: Left Arm, Patient Position: Sitting, Cuff Size: Normal)   Pulse 77   Temp (!) 97.5 F (36.4 C) (Oral)   Resp 18   Wt 177 lb 11.2 oz (80.6 kg)   SpO2 93%   BMI 27.83 kg/m  BP Readings from Last 3 Encounters:  02/01/24 135/85  10/12/23 (!) 143/91  09/15/23 120/68   Wt Readings from Last 3 Encounters:  02/01/24 177 lb 11.2 oz (80.6 kg)  10/12/23 168 lb 3.2 oz (76.3 kg)  09/14/23 165 lb (74.8 kg)      Physical Exam HENT:     Head: Normocephalic and atraumatic.     Nose: Nose normal.     Mouth/Throat:     Mouth:  Mucous membranes are moist.     Pharynx: Oropharynx is clear.  Eyes:     Extraocular Movements: Extraocular movements intact.     Conjunctiva/sclera: Conjunctivae normal.     Pupils: Pupils are equal, round, and reactive to light.  Cardiovascular:     Rate and Rhythm: Normal rate and regular rhythm.     Pulses: Normal pulses.     Heart sounds: Normal heart sounds.  Pulmonary:     Effort: Pulmonary effort is normal.     Breath sounds: Normal breath sounds.  Abdominal:     General: Bowel sounds are normal.     Palpations: Abdomen is soft.  Musculoskeletal:        General: Normal range of motion.  Skin:    General: Skin is warm.     Capillary Refill: Capillary refill takes less than 2 seconds.  Neurological:     General: No focal deficit present.     Mental Status: He is alert. Mental status is at baseline.  Psychiatric:        Mood and Affect: Mood normal.        Behavior: Behavior normal.        Thought Content: Thought content normal.        Judgment: Judgment normal.      No results found for any visits on 02/01/24.  Last CBC Lab Results  Component Value Date   WBC 6.1 09/14/2023   HGB 15.1 09/14/2023   HCT 43.4 09/14/2023   MCV 85.3 09/14/2023   MCH 29.7 09/14/2023   RDW 12.6 09/14/2023   PLT 176 09/14/2023   Last metabolic panel Lab Results  Component Value Date   GLUCOSE 106 (H) 09/14/2023   NA 134 (L) 09/14/2023   K 3.6 09/14/2023   CL 95 (L) 09/14/2023   CO2 28 09/14/2023   BUN 12 09/14/2023   CREATININE 1.10 09/14/2023   GFRNONAA >60 09/14/2023   CALCIUM 8.9 09/14/2023   PHOS 3.2 10/18/2021   PROT 8.5 (H) 09/14/2023   ALBUMIN 4.1 09/14/2023   LABGLOB 3.9 02/02/2023   AGRATIO 1.2 02/02/2023   BILITOT 0.9 09/14/2023   ALKPHOS 86 09/14/2023   AST 26 09/14/2023   ALT 18 09/14/2023   ANIONGAP 11 09/14/2023   Last lipids Lab Results  Component Value Date   CHOL 145 05/18/2022   HDL 53 05/18/2022   LDLCALC 72 05/18/2022   TRIG 113 05/18/2022    CHOLHDL 2.7 05/18/2022   Last hemoglobin A1c Lab Results  Component Value Date   HGBA1C 6.1 (A) 11/08/2022   Last thyroid functions Lab Results  Component Value Date   TSH 0.219 (L) 12/21/2020   Last vitamin D No results found for: "25OHVITD2", "25OHVITD3", "VD25OH" Last vitamin B12 and Folate No results found for: "VITAMINB12", "FOLATE"    The 10-year ASCVD risk score (Arnett DK, et al., 2019) is: 10.4%    Assessment & Plan:   1. Paroxysmal atrial fibrillation (HCC) -He denies palpitation, he will continue current medications. - diltiazem (CARDIZEM CD) 120 MG 24 hr capsule; Take 1 capsule (120 mg total) by mouth daily.  Dispense: 30 capsule; Refill: 2 - diltiazem (CARDIZEM) 30 MG tablet; Take 1 tablet (30 mg total) by mouth daily as needed. For elevated heart rates  Dispense: 30 tablet; Refill: 2  2. Essential (primary) hypertension (Primary) -Blood pressure is improving, and continue current medication.  Advised to continue on DASH diet and exercise as tolerated. - diltiazem (CARDIZEM CD) 120 MG 24 hr capsule; Take 1 capsule (120 mg total) by mouth daily.  Dispense: 30 capsule; Refill: 2  3. History of BPH -Will continue current medication and follow-up with urologist. - tamsulosin (FLOMAX) 0.4 MG CAPS capsule; Take 1 capsule (0.4 mg total) by mouth once daily after breakfast.  Dispense: 30 capsule; Refill: 2  4. Erectile dysfunction, unspecified erectile dysfunction type -Will check testosterone and will refer to urology. - Testosterone; Future   Return in about 2 weeks (around 02/15/2024), or if symptoms worsen or fail to improve.    Chioma Trellis Paganini, NP

## 2024-02-01 NOTE — Patient Instructions (Signed)

## 2024-02-02 ENCOUNTER — Other Ambulatory Visit: Payer: Self-pay

## 2024-02-05 ENCOUNTER — Other Ambulatory Visit: Payer: Self-pay

## 2024-02-08 ENCOUNTER — Other Ambulatory Visit: Payer: Self-pay

## 2024-02-15 ENCOUNTER — Ambulatory Visit: Payer: Self-pay

## 2024-03-12 ENCOUNTER — Other Ambulatory Visit: Payer: Self-pay

## 2024-03-12 ENCOUNTER — Telehealth: Payer: Self-pay

## 2024-03-12 NOTE — Telephone Encounter (Signed)
Called to make pt appt. No answer. Left msg.

## 2024-04-04 ENCOUNTER — Ambulatory Visit: Payer: Self-pay | Admitting: Gerontology

## 2024-04-08 ENCOUNTER — Telehealth: Payer: Self-pay

## 2024-04-08 NOTE — Telephone Encounter (Signed)
 Called pt to reschedule appt. Pt has no showed 3 times in a row. No answer, Left msg.

## 2024-04-16 ENCOUNTER — Other Ambulatory Visit (HOSPITAL_COMMUNITY): Payer: Self-pay

## 2024-04-16 ENCOUNTER — Other Ambulatory Visit: Payer: Self-pay

## 2024-04-18 ENCOUNTER — Other Ambulatory Visit: Payer: Self-pay

## 2024-04-18 ENCOUNTER — Ambulatory Visit: Payer: Self-pay | Admitting: Gerontology

## 2024-04-18 VITALS — BP 120/85 | HR 97 | Ht 67.0 in | Wt 169.3 lb

## 2024-04-18 DIAGNOSIS — I480 Paroxysmal atrial fibrillation: Secondary | ICD-10-CM

## 2024-04-18 DIAGNOSIS — Z0000 Encounter for general adult medical examination without abnormal findings: Secondary | ICD-10-CM

## 2024-04-18 DIAGNOSIS — Z87438 Personal history of other diseases of male genital organs: Secondary | ICD-10-CM

## 2024-04-18 DIAGNOSIS — I10 Essential (primary) hypertension: Secondary | ICD-10-CM

## 2024-04-18 MED ORDER — TAMSULOSIN HCL 0.4 MG PO CAPS
0.4000 mg | ORAL_CAPSULE | Freq: Every day | ORAL | 4 refills | Status: DC
  Filled 2024-04-18: qty 30, 30d supply, fill #0
  Filled 2024-06-20: qty 30, 30d supply, fill #1

## 2024-04-18 MED ORDER — DILTIAZEM HCL ER COATED BEADS 120 MG PO CP24
120.0000 mg | ORAL_CAPSULE | Freq: Every day | ORAL | 4 refills | Status: DC
  Filled 2024-04-18 – 2024-04-30 (×2): qty 30, 30d supply, fill #0
  Filled 2024-06-18: qty 30, 30d supply, fill #1

## 2024-04-18 NOTE — Patient Instructions (Signed)

## 2024-04-18 NOTE — Progress Notes (Signed)
 Established Patient Office Visit  Subjective   [name], male    DOB: Aug 21, 1971  Age: 53 y.o. MRN: [id number]  Chief Complaint  Patient presents with   Hypertension    Pt follow up on hypertension and reports checking blood pressure frequently throughout the week.     HPI  Bruce [name] is a 53 y/o male with a PMH of afib (not on anticoag due to chas vas score of 0), hypertension, BPH and prediabetes who presents for follow up visit and medication refill. He reports that he is compliant and taking all his medications as prescribed. He also reports checking his blood pressure at home but did not bring a log to the appointment. He states that his blood pressure is within normal limits. He denies any headaches, dizziness, palpitations or shortness of breath and vision changes. He also reports that his sex drive  has improved and got married 2 weeks ago . Overall, he  states that he is doing great and offers no further complaint.    Review of Systems  Constitutional: Negative.   Eyes: Negative.   Respiratory: Negative.    Cardiovascular: Negative.   Neurological: Negative.   Psychiatric/Behavioral: Negative.        Objective:     BP 120/85 (BP Location: Right Arm, Patient Position: Sitting, Cuff Size: Normal)   Pulse 97   Ht 5' 7 (1.702 m)   Wt 169 lb 4.8 oz (76.8 kg)   SpO2 96%   BMI 26.52 kg/m  BP Readings from Last 3 Encounters:  04/18/24 120/85  02/01/24 135/85  10/12/23 (!) 143/91   Wt Readings from Last 3 Encounters:  04/18/24 169 lb 4.8 oz (76.8 kg)  02/01/24 177 lb 11.2 oz (80.6 kg)  10/12/23 168 lb 3.2 oz (76.3 kg)      Physical Exam Vitals reviewed.  HENT:     Head: Normocephalic and atraumatic.     Mouth/Throat:     Mouth: Mucous membranes are moist.     Pharynx: Oropharynx is clear.   Eyes:     Extraocular Movements: Extraocular movements intact.     Conjunctiva/sclera: Conjunctivae normal.     Pupils: Pupils are equal,  round, and reactive to light.    Cardiovascular:     Rate and Rhythm: Normal rate and regular rhythm.     Pulses: Normal pulses.     Heart sounds: Normal heart sounds.  Pulmonary:     Effort: Pulmonary effort is normal.     Breath sounds: Normal breath sounds.   Skin:    General: Skin is warm and dry.     Capillary Refill: Capillary refill takes less than 2 seconds.   Neurological:     General: No focal deficit present.     Mental Status: He is alert and oriented to person, place, and time.   Psychiatric:        Mood and Affect: Mood normal.        Behavior: Behavior normal.      No results found for any visits on 04/18/24.  Last CBC Lab Results  Component Value Date   WBC 6.1 09/14/2023   HGB 15.1 09/14/2023   HCT 43.4 09/14/2023   MCV 85.3 09/14/2023   MCH 29.7 09/14/2023   RDW 12.6 09/14/2023   PLT 176 09/14/2023   Last metabolic panel Lab Results  Component Value Date   GLUCOSE 106 (H) 09/14/2023   NA 134 (L) 09/14/2023   K 3.6  09/14/2023   CL 95 (L) 09/14/2023   CO2 28 09/14/2023   BUN 12 09/14/2023   CREATININE 1.10 09/14/2023   GFRNONAA >60 09/14/2023   CALCIUM  8.9 09/14/2023   PHOS 3.2 10/18/2021   PROT 8.5 (H) 09/14/2023   ALBUMIN 4.1 09/14/2023   LABGLOB 3.9 02/02/2023   AGRATIO 1.2 02/02/2023   BILITOT 0.9 09/14/2023   ALKPHOS 86 09/14/2023   AST 26 09/14/2023   ALT 18 09/14/2023   ANIONGAP 11 09/14/2023   Last lipids Lab Results  Component Value Date   CHOL 145 05/18/2022   HDL 53 05/18/2022   LDLCALC 72 05/18/2022   TRIG 113 05/18/2022   CHOLHDL 2.7 05/18/2022   Last hemoglobin A1c Lab Results  Component Value Date   HGBA1C 6.1 (A) 11/08/2022   Last thyroid  functions Lab Results  Component Value Date   TSH 0.219 (L) 12/21/2020   Last vitamin D No results found for: 25OHVITD2, 25OHVITD3, VD25OH Last vitamin B12 and Folate No results found for: VITAMINB12, FOLATE    The 10-year ASCVD risk score (Arnett DK, et  al., 2019) is: 8.7%    Assessment & Plan:   .Bruce Aas1. Paroxysmal atrial fibrillation (HCC) His A- fib well controled with  medication. - encourage to continue medication daily - diltiazem  (CARDIZEM  CD) 120 MG 24 hr capsule; Take 1 capsule (120 mg total) by mouth daily.  Dispense: 30 capsule; Refill: 4 -report  any chest  pain and palpitation  - seek help in ED in case of heart pain.  2. Essential (primary) hypertension (Primary) His BP is under control with medication.  - encourage monitoring BP daily - encourage to continue medication daily - diltiazem  (CARDIZEM  CD) 120 MG 24 hr capsule; Take 1 capsule (120 mg total) by mouth daily.  Dispense: 30 capsule; Refill: 4 - encourage maintain high hydration status for hypotension prevention. - follow DASH diet.  3. History of BPH - continue medication as ordered - tamsulosin  (FLOMAX ) 0.4 MG CAPS capsule; Take 1 capsule (0.4 mg total) by mouth once daily after breakfast.  Dispense: 30 capsule; Refill: 4 - monitor and report any changes in urination pattern. - PSA, total and free; Future  4. Health care maintenance - encourage making healthy choices in food and routine labs will be checked. - Follow DASH and low carb diets.  - encourage  daily exercise. - CBC with Differential/Platelet; Future - Comp Met (CMET); Future - Lipid panel; Future - HgB A1c; Future      Return in about 21 weeks (around 09/12/2024), or if symptoms worsen or fail to improve, for Lab 09/03/24, F/U 09/12/24 in clinic.    Bruce Holtsclaw E Cordarious Zeek, NP

## 2024-04-19 ENCOUNTER — Other Ambulatory Visit: Payer: Self-pay

## 2024-04-30 ENCOUNTER — Other Ambulatory Visit: Payer: Self-pay

## 2024-05-01 ENCOUNTER — Other Ambulatory Visit: Payer: Self-pay

## 2024-06-18 ENCOUNTER — Other Ambulatory Visit: Payer: Self-pay

## 2024-06-20 ENCOUNTER — Ambulatory Visit: Payer: Self-pay | Admitting: Gerontology

## 2024-06-20 ENCOUNTER — Other Ambulatory Visit: Payer: Self-pay

## 2024-06-20 DIAGNOSIS — Z0000 Encounter for general adult medical examination without abnormal findings: Secondary | ICD-10-CM

## 2024-06-20 DIAGNOSIS — N529 Male erectile dysfunction, unspecified: Secondary | ICD-10-CM

## 2024-06-20 DIAGNOSIS — I10 Essential (primary) hypertension: Secondary | ICD-10-CM

## 2024-06-20 DIAGNOSIS — I480 Paroxysmal atrial fibrillation: Secondary | ICD-10-CM

## 2024-06-20 DIAGNOSIS — Z87438 Personal history of other diseases of male genital organs: Secondary | ICD-10-CM

## 2024-06-20 MED ORDER — DILTIAZEM HCL ER 120 MG PO CP24
120.0000 mg | ORAL_CAPSULE | Freq: Every day | ORAL | 1 refills | Status: AC
Start: 2024-06-20 — End: ?
  Filled 2024-06-20: qty 90, 90d supply, fill #0
  Filled 2024-09-24: qty 90, 90d supply, fill #1

## 2024-06-20 NOTE — Patient Instructions (Signed)
 Diabetes Mellitus and Nutrition, Adult When you have diabetes, or diabetes mellitus, it is very important to have healthy eating habits because your blood sugar (glucose) levels are greatly affected by what you eat and drink. Eating healthy foods in the right amounts, at about the same times every day, can help you: Manage your blood glucose. Lower your risk of heart disease. Improve your blood pressure. Reach or maintain a healthy weight. What can affect my meal plan? Every person with diabetes is different, and each person has different needs for a meal plan. Your health care provider may recommend that you work with a dietitian to make a meal plan that is best for you. Your meal plan may vary depending on factors such as: The calories you need. The medicines you take. Your weight. Your blood glucose, blood pressure, and cholesterol levels. Your activity level. Other health conditions you have, such as heart or kidney disease. How do carbohydrates affect me? Carbohydrates, also called carbs, affect your blood glucose level more than any other type of food. Eating carbs raises the amount of glucose in your blood. It is important to know how many carbs you can safely have in each meal. This is different for every person. Your dietitian can help you calculate how many carbs you should have at each meal and for each snack. How does alcohol affect me? Alcohol can cause a decrease in blood glucose (hypoglycemia), especially if you use insulin  or take certain diabetes medicines by mouth. Hypoglycemia can be a life-threatening condition. Symptoms of hypoglycemia, such as sleepiness, dizziness, and confusion, are similar to symptoms of having too much alcohol. Do not drink alcohol if: Your health care provider tells you not to drink. You are pregnant, may be pregnant, or are planning to become pregnant. If you drink alcohol: Limit how much you have to: 0-1 drink a day for women. 0-2 drinks a day  for men. Know how much alcohol is in your drink. In the [place] one drink equals one 12 oz bottle of beer (355 mL), one 5 oz glass of wine (148 mL), or one 1 oz glass of hard liquor (44 mL). Keep yourself hydrated with water, diet soda, or unsweetened iced tea. Keep in mind that regular soda, juice, and other mixers may contain a lot of sugar and must be counted as carbs. What are tips for following this plan?  Reading food labels Start by checking the serving size on the Nutrition Facts label of packaged foods and drinks. The number of calories and the amount of carbs, fats, and other nutrients listed on the label are based on one serving of the item. Many items contain more than one serving per package. Check the total grams (g) of carbs in one serving. Check the number of grams of saturated fats and trans fats in one serving. Choose foods that have a low amount or none of these fats. Check the number of milligrams (mg) of salt (sodium) in one serving. Most people should limit total sodium intake to less than 2,300 mg per day. Always check the nutrition information of foods labeled as low-fat or nonfat. These foods may be higher in added sugar or refined carbs and should be avoided. Talk to your dietitian to identify your daily goals for nutrients listed on the label. Shopping Avoid buying canned, pre-made, or processed foods. These foods tend to be high in fat, sodium, and added sugar. Shop around the outside edge of the grocery store. This is where you  will most often find fresh fruits and vegetables, bulk grains, fresh meats, and fresh dairy products. Cooking Use low-heat cooking methods, such as baking, instead of high-heat cooking methods, such as deep frying. Cook using healthy oils, such as olive, canola, or sunflower oil. Avoid cooking with butter, cream, or high-fat meats. Meal planning Eat meals and snacks regularly, preferably at the same times every day. Avoid going long periods  of time without eating. Eat foods that are high in fiber, such as fresh fruits, vegetables, beans, and whole grains. Eat 4-6 oz (112-168 g) of lean protein each day, such as lean meat, chicken, fish, eggs, or tofu. One ounce (oz) (28 g) of lean protein is equal to: 1 oz (28 g) of meat, chicken, or fish. 1 egg.  cup (62 g) of tofu. Eat some foods each day that contain healthy fats, such as avocado, nuts, seeds, and fish. What foods should I eat? Fruits Berries. Apples. Oranges. Peaches. Apricots. Plums. Grapes. Mangoes. Papayas. Pomegranates. Kiwi. Cherries. Vegetables Leafy greens, including lettuce, spinach, kale, chard, collard greens, mustard greens, and cabbage. Beets. Cauliflower. Broccoli. Carrots. Green beans. Tomatoes. Peppers. Onions. Cucumbers. Brussels sprouts. Grains Whole grains, such as whole-wheat or whole-grain bread, crackers, tortillas, cereal, and pasta. Unsweetened oatmeal. Quinoa. Crudup or wild rice. Meats and other proteins Seafood. Poultry without skin. Lean cuts of poultry and beef. Tofu. Nuts. Seeds. Dairy Low-fat or fat-free dairy products such as milk, yogurt, and cheese. The items listed above may not be a complete list of foods and beverages you can eat and drink. Contact a dietitian for more information. What foods should I avoid? Fruits Fruits canned with syrup. Vegetables Canned vegetables. Frozen vegetables with butter or cream sauce. Grains Refined white flour and flour products such as bread, pasta, snack foods, and cereals. Avoid all processed foods. Meats and other proteins Fatty cuts of meat. Poultry with skin. Breaded or fried meats. Processed meat. Avoid saturated fats. Dairy Full-fat yogurt, cheese, or milk. Beverages Sweetened drinks, such as soda or iced tea. The items listed above may not be a complete list of foods and beverages you should avoid. Contact a dietitian for more information. Questions to ask a health care provider Do I need  to meet with a certified diabetes care and education specialist? Do I need to meet with a dietitian? What number can I call if I have questions? When are the best times to check my blood glucose? Where to find more information: American Diabetes Association: diabetes.org Academy of Nutrition and Dietetics: eatright.[employer] of Diabetes and Digestive and Kidney Diseases: StageSync.si Association of Diabetes Care & Education Specialists: diabeteseducator.org Summary It is important to have healthy eating habits because your blood sugar (glucose) levels are greatly affected by what you eat and drink. It is important to use alcohol carefully. A healthy meal plan will help you manage your blood glucose and lower your risk of heart disease. Your health care provider may recommend that you work with a dietitian to make a meal plan that is best for you. This information is not intended to replace advice given to you by your health care provider. Make sure you discuss any questions you have with your health care provider. Document Revised: 05/26/2020 Document Reviewed: 05/27/2020 Elsevier Patient Education  2024 Elsevier Inc.DASH Eating Plan DASH stands for Dietary Approaches to Stop Hypertension. The DASH eating plan is a healthy eating plan that has been shown to: Lower high blood pressure (hypertension). Reduce your risk for type 2 diabetes,  heart disease, and stroke. Help with weight loss. What are tips for following this plan? Reading food labels Check food labels for the amount of salt (sodium) per serving. Choose foods with less than 5 percent of the Daily Value (DV) of sodium. In general, foods with less than 300 milligrams (mg) of sodium per serving fit into this eating plan. To find whole grains, look for the word whole as the first word in the ingredient list. Shopping Buy products labeled as low-sodium or no salt added. Buy fresh foods. Avoid canned foods and  pre-made or frozen meals. Cooking Try not to add salt when you cook. Use salt-free seasonings or herbs instead of table salt or sea salt. Check with your health care provider or pharmacist before using salt substitutes. Do not fry foods. Cook foods in healthy ways, such as baking, boiling, grilling, roasting, or broiling. Cook using oils that are good for your heart. These include olive, canola, avocado, soybean, and sunflower oil. Meal planning  Eat a balanced diet. This should include: 4 or more servings of fruits and 4 or more servings of vegetables each day. Try to fill half of your plate with fruits and vegetables. 6-8 servings of whole grains each day. 6 or less servings of lean meat, poultry, or fish each day. 1 oz is 1 serving. A 3 oz (85 g) serving of meat is about the same size as the palm of your hand. One egg is 1 oz (28 g). 2-3 servings of low-fat dairy each day. One serving is 1 cup (237 mL). 1 serving of nuts, seeds, or beans 5 times each week. 2-3 servings of heart-healthy fats. Healthy fats called omega-3 fatty acids are found in foods such as walnuts, flaxseeds, fortified milks, and eggs. These fats are also found in cold-water fish, such as sardines, salmon, and mackerel. Limit how much you eat of: Canned or prepackaged foods. Food that is high in trans fat, such as fried foods. Food that is high in saturated fat, such as fatty meat. Desserts and other sweets, sugary drinks, and other foods with added sugar. Full-fat dairy products. Do not salt foods before eating. Do not eat more than 4 egg yolks a week. Try to eat at least 2 vegetarian meals a week. Eat more home-cooked food and less restaurant, buffet, and fast food. Lifestyle When eating at a restaurant, ask if your food can be made with less salt or no salt. If you drink alcohol: Limit how much you have to: 0-1 drink a day if you are male. 0-2 drinks a day if you are male. Know how much alcohol is in your  drink. In the [place] one drink is one 12 oz bottle of beer (355 mL), one 5 oz glass of wine (148 mL), or one 1 oz glass of hard liquor (44 mL). General information Avoid eating more than 2,300 mg of salt a day. If you have hypertension, you may need to reduce your sodium intake to 1,500 mg a day. Work with your provider to stay at a healthy body weight or lose weight. Ask what the best weight range is for you. On most days of the week, get at least 30 minutes of exercise that causes your heart to beat faster. This may include walking, swimming, or biking. Work with your provider or dietitian to adjust your eating plan to meet your specific calorie needs. What foods should I eat? Fruits All fresh, dried, or frozen fruit. Canned fruits that are in their  natural juice and do not have sugar added to them. Vegetables Fresh or frozen vegetables that are raw, steamed, roasted, or grilled. Low-sodium or reduced-sodium tomato and vegetable juice. Low-sodium or reduced-sodium tomato sauce and tomato paste. Low-sodium or reduced-sodium canned vegetables. Grains Whole-grain or whole-wheat bread. Whole-grain or whole-wheat pasta. Cradle rice. Dwyane Glad. Bulgur. Whole-grain and low-sodium cereals. Pita bread. Low-fat, low-sodium crackers. Whole-wheat flour tortillas. Meats and other proteins Skinless chicken or [place]. Ground chicken or [place]. Pork with fat trimmed off. Fish and seafood. Egg whites. Dried beans, peas, or lentils. Unsalted nuts, nut butters, and seeds. Unsalted canned beans. Lean cuts of beef with fat trimmed off. Low-sodium, lean precooked or cured meat, such as sausages or meat loaves. Dairy Low-fat (1%) or fat-free (skim) milk. Reduced-fat, low-fat, or fat-free cheeses. Nonfat, low-sodium ricotta or cottage cheese. Low-fat or nonfat yogurt. Low-fat, low-sodium cheese. Fats and oils Soft margarine without trans fats. Vegetable oil. Reduced-fat, low-fat, or light mayonnaise and salad  dressings (reduced-sodium). Canola, safflower, olive, avocado, soybean, and sunflower oils. Avocado. Seasonings and condiments Herbs. Spices. Seasoning mixes without salt. Other foods Unsalted popcorn and pretzels. Fat-free sweets. The items listed above may not be all the foods and drinks you can have. Talk to a dietitian to learn more. What foods should I avoid? Fruits Canned fruit in a light or heavy syrup. Fried fruit. Fruit in cream or butter sauce. Vegetables Creamed or fried vegetables. Vegetables in a cheese sauce. Regular canned vegetables that are not marked as low-sodium or reduced-sodium. Regular canned tomato sauce and paste that are not marked as low-sodium or reduced-sodium. Regular tomato and vegetable juices that are not marked as low-sodium or reduced-sodium. Vanessa General. Olives. Grains Baked goods made with fat, such as croissants, muffins, or some breads. Dry pasta or rice meal packs. Meats and other proteins Fatty cuts of meat. Ribs. Fried meat. Helene Loader. Bologna, salami, and other precooked or cured meats, such as sausages or meat loaves, that are not lean and low in sodium. Fat from the back of a pig (fatback). Bratwurst. Salted nuts and seeds. Canned beans with added salt. Canned or smoked fish. Whole eggs or egg yolks. Chicken or [place] with skin. Dairy Whole or 2% milk, cream, and half-and-half. Whole or full-fat cream cheese. Whole-fat or sweetened yogurt. Full-fat cheese. Nondairy creamers. Whipped toppings. Processed cheese and cheese spreads. Fats and oils Butter. Stick margarine. Lard. Shortening. Ghee. Bacon fat. Tropical oils, such as coconut, palm kernel, or palm oil. Seasonings and condiments Onion salt, garlic salt, seasoned salt, table salt, and sea salt. Worcestershire sauce. Tartar sauce. Barbecue sauce. Teriyaki sauce. Soy sauce, including reduced-sodium soy sauce. Steak sauce. Canned and packaged gravies. Fish sauce. Oyster sauce. Cocktail sauce. Store-bought  horseradish. Ketchup. Mustard. Meat flavorings and tenderizers. Bouillon cubes. Hot sauces. Pre-made or packaged marinades. Pre-made or packaged taco seasonings. Relishes. Regular salad dressings. Other foods Salted popcorn and pretzels. The items listed above may not be all the foods and drinks you should avoid. Talk to a dietitian to learn more. Where to find more information National Heart, Lung, and Blood Institute (NHLBI): BuffaloDryCleaner.gl American Heart Association (AHA): heart.org Academy of Nutrition and Dietetics: eatright.org National Kidney Foundation (NKF): kidney.org This information is not intended to replace advice given to you by your health care provider. Make sure you discuss any questions you have with your health care provider. Document Revised: 11/10/2022 Document Reviewed: 11/10/2022 Elsevier Patient Education  2024 [employer].

## 2024-06-20 NOTE — Progress Notes (Signed)
 Established Patient Office Visit  Subjective   [name], male    DOB: 01-01-1971  Age: 53 y.o. MRN: [id number]  No chief complaint on file.   HPI  Bruce [name] is a 53 y/o male with a PMH of afib (not on anticoag due to chas vas score of 0), hypertension, BPH and prediabetes who presents for follow up visit and medication refill. The patient reports that he hasn't taken Cardizem  120 mg Po daily for 4 days due its cost. He complaints forehead pain, rated 8/10. He took Tylenol  650 mg po prior to the clinic. Bp at office: 128/78 mmHg, Hr 76. He checks his blood pressure at home when he feels headache, reading are normal, ranged from 110-120/60-70 mmHg. Patient denies dizziness, chest pain, palpitations or shortness of breath. Patient also reports right wrist pain from previous injury last year. He had an ortho referral, but couldn't make the schedule due to work, and he wants to set up an  appointment today.  He denies muscle/motor weakness and neuropathy.  Overall, he states that he's doing well and offers no further complaint.    Review of Systems  Constitutional: Negative.   Eyes: Negative.   Respiratory: Negative.    Cardiovascular: Negative.   Gastrointestinal: Negative.   Genitourinary: Negative.   Musculoskeletal:        Right wrist pain  Skin: Negative.   Neurological:  Positive for headaches.  Endo/Heme/Allergies: Negative.   Psychiatric/Behavioral: Negative.       Objective:    There were no vitals taken for this visit. BP Readings from Last 3 Encounters:  06/20/24 128/78  04/18/24 120/85  02/01/24 135/85   Wt Readings from Last 3 Encounters:  06/20/24 160 lb 9.6 oz (72.8 kg)  04/18/24 169 lb 4.8 oz (76.8 kg)  02/01/24 177 lb 11.2 oz (80.6 kg)   Physical Exam Constitutional:      Appearance: Normal appearance.  HENT:     Head: Normocephalic.     Right Ear: Tympanic membrane normal.     Left Ear: Tympanic membrane normal.     Nose: Nose  normal.     Mouth/Throat:     Mouth: Mucous membranes are moist.  Eyes:     Extraocular Movements: Extraocular movements intact.     Pupils: Pupils are equal, round, and reactive to light.  Cardiovascular:     Rate and Rhythm: Normal rate and regular rhythm.     Pulses: Normal pulses.     Heart sounds: Normal heart sounds.  Pulmonary:     Effort: Pulmonary effort is normal.     Breath sounds: Normal breath sounds.  Abdominal:     General: Abdomen is flat. Bowel sounds are normal.     Palpations: Abdomen is soft.  Genitourinary:    Comments: Patient deferred to exam Musculoskeletal:        General: Normal range of motion.     Cervical back: Normal range of motion.  Skin:    General: Skin is warm and dry.  Neurological:     General: No focal deficit present.     Mental Status: He is alert and oriented to person, place, and time.  Psychiatric:        Mood and Affect: Mood normal.        Behavior: Behavior normal.    No results found for any visits on 06/20/24.  Last CBC Lab Results  Component Value Date   WBC 6.1 09/14/2023   HGB  15.1 09/14/2023   HCT 43.4 09/14/2023   MCV 85.3 09/14/2023   MCH 29.7 09/14/2023   RDW 12.6 09/14/2023   PLT 176 09/14/2023   Last metabolic panel Lab Results  Component Value Date   GLUCOSE 106 (H) 09/14/2023   NA 134 (L) 09/14/2023   K 3.6 09/14/2023   CL 95 (L) 09/14/2023   CO2 28 09/14/2023   BUN 12 09/14/2023   CREATININE 1.10 09/14/2023   GFRNONAA >60 09/14/2023   CALCIUM  8.9 09/14/2023   PHOS 3.2 10/18/2021   PROT 8.5 (H) 09/14/2023   ALBUMIN 4.1 09/14/2023   LABGLOB 3.9 02/02/2023   AGRATIO 1.2 02/02/2023   BILITOT 0.9 09/14/2023   ALKPHOS 86 09/14/2023   AST 26 09/14/2023   ALT 18 09/14/2023   ANIONGAP 11 09/14/2023   Last lipids Lab Results  Component Value Date   CHOL 145 05/18/2022   HDL 53 05/18/2022   LDLCALC 72 05/18/2022   TRIG 113 05/18/2022   CHOLHDL 2.7 05/18/2022   Last hemoglobin A1c Lab Results   Component Value Date   HGBA1C 6.1 (A) 11/08/2022   Last thyroid  functions Lab Results  Component Value Date   TSH 0.219 (L) 12/21/2020   Last vitamin D No results found for: 25OHVITD2, 25OHVITD3, VD25OH Last vitamin B12 and Folate No results found for: VITAMINB12, FOLATE    The 10-year ASCVD risk score (Arnett DK, et al., 2019) is: 8.7%    Assessment & Plan:  1. Paroxysmal atrial fibrillation (HCC) He will continue on current medication as prescribed. He was advised to call office for any side of effective of medications.  Diltiazem  CD 120 mg was changed to diltiazem  XR due to cost.  He was advised to notify clinic for any side effects. - diltiazem  (DILACOR XR ) 120 MG 24 hr capsule; Take 1 capsule (120 mg total) by mouth daily.  Dispense: 90 capsule; Refill: 1  2. Essential (primary) hypertension  He was encouraged to check his Bp at home and bring the log at the next visit. He was encouraged to follow low salt /low fat diet and exercise as tolerated. He was advised to take medication as prescribed and go to ED if having severe headache, chest pain, or shortness of breath.    Follow up in 1 week (06/27/2024) in clinic for lab review.    Lajoyce Cummins, NP

## 2024-06-21 LAB — COMPREHENSIVE METABOLIC PANEL WITH GFR
ALT: 31 IU/L (ref 0–44)
AST: 36 IU/L (ref 0–40)
Albumin: 4.2 g/dL (ref 3.8–4.9)
Alkaline Phosphatase: 113 IU/L (ref 44–121)
BUN/Creatinine Ratio: 8 — ABNORMAL LOW (ref 9–20)
BUN: 7 mg/dL (ref 6–24)
Bilirubin Total: 0.6 mg/dL (ref 0.0–1.2)
CO2: 18 mmol/L — ABNORMAL LOW (ref 20–29)
Calcium: 9.9 mg/dL (ref 8.7–10.2)
Chloride: 105 mmol/L (ref 96–106)
Creatinine, Ser: 0.89 mg/dL (ref 0.76–1.27)
Globulin, Total: 3.4 g/dL (ref 1.5–4.5)
Glucose: 114 mg/dL — ABNORMAL HIGH (ref 70–99)
Potassium: 4.2 mmol/L (ref 3.5–5.2)
Sodium: 138 mmol/L (ref 134–144)
Total Protein: 7.6 g/dL (ref 6.0–8.5)
eGFR: 101 mL/min/1.73 (ref >59)

## 2024-06-21 LAB — CBC WITH DIFFERENTIAL/PLATELET
Basophils Absolute: 0 x10E3/uL (ref 0.0–0.2)
Basos: 1 %
EOS (ABSOLUTE): 0.1 x10E3/uL (ref 0.0–0.4)
Eos: 1 %
Hematocrit: 50.5 % (ref 37.5–51.0)
Hemoglobin: 16.7 g/dL (ref 13.0–17.7)
Immature Grans (Abs): 0 x10E3/uL (ref 0.0–0.1)
Immature Granulocytes: 0 %
Lymphocytes Absolute: 2.8 x10E3/uL (ref 0.7–3.1)
Lymphs: 54 %
MCH: 31 pg (ref 26.6–33.0)
MCHC: 33.1 g/dL (ref 31.5–35.7)
MCV: 94 fL (ref 79–97)
Monocytes Absolute: 0.5 x10E3/uL (ref 0.1–0.9)
Monocytes: 10 %
Neutrophils Absolute: 1.7 x10E3/uL (ref 1.4–7.0)
Neutrophils: 34 %
Platelets: 266 x10E3/uL (ref 150–450)
RBC: 5.39 x10E6/uL (ref 4.14–5.80)
RDW: 12.8 % (ref 11.6–15.4)
WBC: 5.1 x10E3/uL (ref 3.4–10.8)

## 2024-06-21 LAB — TESTOSTERONE: Testosterone: 813 ng/dL (ref 264–916)

## 2024-06-21 LAB — LIPID PANEL
Chol/HDL Ratio: 2.3 ratio (ref 0.0–5.0)
Cholesterol, Total: 140 mg/dL (ref 100–199)
HDL: 61 mg/dL (ref >39)
LDL Chol Calc (NIH): 65 mg/dL (ref 0–99)
Triglycerides: 72 mg/dL (ref 0–149)
VLDL Cholesterol Cal: 14 mg/dL (ref 5–40)

## 2024-06-21 LAB — PSA, TOTAL AND FREE
PSA, Free Pct: 13.8 %
PSA, Free: 0.94 ng/mL
Prostate Specific Ag, Serum: 6.8 ng/mL — ABNORMAL HIGH (ref 0.0–4.0)

## 2024-06-21 LAB — HEMOGLOBIN A1C
Est. average glucose Bld gHb Est-mCnc: 146 mg/dL
Hgb A1c MFr Bld: 6.7 % — ABNORMAL HIGH (ref 4.8–5.6)

## 2024-06-27 ENCOUNTER — Other Ambulatory Visit: Payer: Self-pay

## 2024-06-27 ENCOUNTER — Ambulatory Visit: Payer: Self-pay | Admitting: Gerontology

## 2024-06-27 VITALS — BP 135/81 | HR 76 | Wt 161.8 lb

## 2024-06-27 DIAGNOSIS — E119 Type 2 diabetes mellitus without complications: Secondary | ICD-10-CM | POA: Insufficient documentation

## 2024-06-27 DIAGNOSIS — R972 Elevated prostate specific antigen [PSA]: Secondary | ICD-10-CM | POA: Insufficient documentation

## 2024-06-27 MED ORDER — METFORMIN HCL ER 500 MG PO TB24
500.0000 mg | ORAL_TABLET | Freq: Every day | ORAL | 0 refills | Status: DC
Start: 2024-06-27 — End: 2024-09-24
  Filled 2024-06-27: qty 30, 30d supply, fill #0

## 2024-06-27 NOTE — Patient Instructions (Signed)

## 2024-06-27 NOTE — Progress Notes (Signed)
 Established Patient Office Visit  Subjective   [name], male    DOB: 1971-03-30  Age: 53 y.o. MRN: [id number]  Chief Complaint  Patient presents with   Follow-up    HPI  Bruce [name] is a 53 y/o male with a PMH of afib (not on anticoag due to chas vas score of 0), hypertension, BPH and prediabetes who presents for follow up visit and lab review.His labs checked on 8/14 25, HgbA1c was 6.7% , PSA was 6.8 ng/ml and the rest was unremarkable. He denies chest pain, palpitation, light headedness, shortness of breath and vision changes. Overall, he states that he's doing well and offers no further complaints.  Review of Systems  Constitutional: Negative.   Eyes: Negative.   Respiratory: Negative.    Cardiovascular: Negative.   Neurological: Negative.   Endo/Heme/Allergies: Negative.   Psychiatric/Behavioral: Negative.        Objective:     BP 135/81 (BP Location: Left Arm, Patient Position: Sitting)   Pulse 76   Wt 161 lb 12.8 oz (73.4 kg)   SpO2 92%   BMI 25.34 kg/m  BP Readings from Last 3 Encounters:  06/27/24 135/81  06/20/24 128/78  04/18/24 120/85   Wt Readings from Last 3 Encounters:  06/27/24 161 lb 12.8 oz (73.4 kg)  06/20/24 160 lb 9.6 oz (72.8 kg)  04/18/24 169 lb 4.8 oz (76.8 kg)      Physical Exam HENT:     Head: Normocephalic and atraumatic.     Mouth/Throat:     Mouth: Mucous membranes are moist.     Pharynx: Oropharynx is clear.  Eyes:     Extraocular Movements: Extraocular movements intact.     Pupils: Pupils are equal, round, and reactive to light.  Cardiovascular:     Rate and Rhythm: Normal rate and regular rhythm.     Pulses: Normal pulses.     Heart sounds: Normal heart sounds.  Pulmonary:     Effort: Pulmonary effort is normal.     Breath sounds: Normal breath sounds.  Skin:    General: Skin is warm.  Neurological:     General: No focal deficit present.     Mental Status: He is alert and oriented to person,  place, and time.  Psychiatric:        Mood and Affect: Mood normal.        Behavior: Behavior normal.      No results found for any visits on 06/27/24.  Last CBC Lab Results  Component Value Date   WBC 5.1 06/20/2024   HGB 16.7 06/20/2024   HCT 50.5 06/20/2024   MCV 94 06/20/2024   MCH 31.0 06/20/2024   RDW 12.8 06/20/2024   PLT 266 06/20/2024   Last metabolic panel Lab Results  Component Value Date   GLUCOSE 114 (H) 06/20/2024   NA 138 06/20/2024   K 4.2 06/20/2024   CL 105 06/20/2024   CO2 18 (L) 06/20/2024   BUN 7 06/20/2024   CREATININE 0.89 06/20/2024   EGFR 101 06/20/2024   CALCIUM  9.9 06/20/2024   PHOS 3.2 10/18/2021   PROT 7.6 06/20/2024   ALBUMIN 4.2 06/20/2024   LABGLOB 3.4 06/20/2024   AGRATIO 1.2 02/02/2023   BILITOT 0.6 06/20/2024   ALKPHOS 113 06/20/2024   AST 36 06/20/2024   ALT 31 06/20/2024   ANIONGAP 11 09/14/2023   Last lipids Lab Results  Component Value Date   CHOL 140 06/20/2024   HDL 61  06/20/2024   LDLCALC 65 06/20/2024   TRIG 72 06/20/2024   CHOLHDL 2.3 06/20/2024   Last hemoglobin A1c Lab Results  Component Value Date   HGBA1C 6.7 (H) 06/20/2024   Last thyroid  functions Lab Results  Component Value Date   TSH 0.219 (L) 12/21/2020   Last vitamin D No results found for: 25OHVITD2, 25OHVITD3, VD25OH Last vitamin B12 and Folate No results found for: VITAMINB12, FOLATE    The 10-year ASCVD risk score (Arnett DK, et al., 2019) is: 18.7%    Assessment & Plan:   1. Type 2 diabetes mellitus without complication, without long-term current use of insulin (HCC) (Primary) - Her HgbA1c was 6.7%, was started on Metformin , educated on medication side effects and advised to notify clinic. He was advised to continue on low carb/non concentrated sweet diet and exercise as tolerated. - metFORMIN  (GLUCOPHAGE -XR) 500 MG 24 hr tablet; Take 1 tablet (500 mg total) by mouth daily with breakfast.  Dispense: 30 tablet; Refill:  0  2. Elevated PSA - His PSA was elevated at 6.8 ng/ml, will follow up with Urology. - Ambulatory referral to Urology   Return in about 4 weeks (around 07/25/2024), or if symptoms worsen or fail to improve.    Collins Kerby E Leeana Creer, NP

## 2024-07-09 ENCOUNTER — Ambulatory Visit: Payer: Self-pay | Admitting: Rheumatology

## 2024-07-25 ENCOUNTER — Ambulatory Visit: Payer: Self-pay

## 2024-08-08 ENCOUNTER — Ambulatory Visit: Payer: Self-pay | Admitting: Physician Assistant

## 2024-08-08 VITALS — BP 131/86 | HR 69 | Temp 98.1°F | Ht 67.0 in | Wt 161.2 lb

## 2024-08-08 DIAGNOSIS — M722 Plantar fascial fibromatosis: Secondary | ICD-10-CM

## 2024-08-08 DIAGNOSIS — Z87438 Personal history of other diseases of male genital organs: Secondary | ICD-10-CM

## 2024-08-08 DIAGNOSIS — S6991XA Unspecified injury of right wrist, hand and finger(s), initial encounter: Secondary | ICD-10-CM

## 2024-08-08 MED ORDER — TAMSULOSIN HCL 0.4 MG PO CAPS
0.4000 mg | ORAL_CAPSULE | Freq: Every day | ORAL | 3 refills | Status: AC
  Filled 2024-08-08: qty 30, 30d supply, fill #0
  Filled 2024-09-24: qty 30, 30d supply, fill #1
  Filled 2024-12-12 (×2): qty 30, 30d supply, fill #2

## 2024-08-08 MED ORDER — NAPROXEN 500 MG PO TABS
250.0000 mg | ORAL_TABLET | Freq: Two times a day (BID) | ORAL | 0 refills | Status: DC
  Filled 2024-08-08: qty 45, 45d supply, fill #0

## 2024-08-08 NOTE — Patient Instructions (Addendum)
 Follow-up in 4-6 weeks, sooner if needed. Call, return, or seek care elsewhere if any questions, concerns, or new or worsening symptoms.

## 2024-08-08 NOTE — Progress Notes (Signed)
 Established Patient Office Visit  Subjective   [name], male    DOB: 1971-01-26  Age: 53 y.o. MRN: [id number]  Chief Complaint  Patient presents with   Foot Injury   Hand Pain    HPI Pt presents w/ complaint of pain in right ring finger after a recent work injury (he believes he broke the finger) and an unrelated development of pain in his left medial heel.   He maintains full dexterity, ROM, and grip strength in his right hand, albeit with some pain due to the injury. The finger injury was from him falling and hyperflexing his ring finger; there was no crush mechanism, force to tip, or avulsive mechanism involved in the injury. Use of the affected ring finger elicits pain (as expected). He rates it at about a 2-3/10 at rest, but use can increase this to about a 5-6/10. The pain does not radiate anywhere else.  He mentions the heel pain coming after recent increase in weight-bearing activity (including some exercising), and it generally is much worse first thing in his day or after a long period of being off his feet, but it can sometimes be worsened by prolonged periods of weight bearing. At its worst, it can be about a 7/10.  He denies any red flag symptoms for either complaint. He also requests a refill on his Flomax . Other than the above, he reports doing well, and he has no further complaints.   ROS Review of systems (may be based on changes from baseline unless otherwise stated, and thus a negative report does not necessarily indicate an absence of a symptom, but rather the stability of a patient's baseline symptoms, if applicable) See HPI as well, with ROS here building on that already noted in HPI ----------------- General: No malaise, fatigue, or weight change. Eyes: No deterioration or change in vision. Respiratory: No SOB, DOE, cough, wheezing. Cardiovascular: No chest pain or chest pressure. No pain or pressure in neck, arm, jaw, or epigastric area. No  palpitations or edema. Gastrointestinal: No abdominal pain, indigestion, or other gastric or intestinal symptoms. No change in bowel frequency or characteristics. Neurologic: No headaches, syncope, numbness, weakness, dizziness or lightheadedness.    Objective:     BP 131/86 (BP Location: Left Arm, Patient Position: Sitting, Cuff Size: Normal)   Pulse 69   Temp 98.1 F (36.7 C) (Oral)   Ht 5' 7 (1.702 m)   Wt 161 lb 3.2 oz (73.1 kg)   SpO2 96%   BMI 25.25 kg/m    Physical Exam Physical exam ----------------- Constitutional: A&Ox3 and NAD. Pleasant, cooperative. Respiratory: Breathing unlabored. Good, symmetric chest expansion and air entry. Lungs clear to auscultation bilaterally. Cardiovascular: Normal rate with no evidence of dysrhythmia by auscultation or pulse. S1 and S2 with normal auscultatory characteristics. No murmurs, rubs, or gallops. Neuro: No deficits grossly apparent or observed as part of conversation or examination of other systems MS: -Right ring finger shows mild swelling and minimal bruising compared with uninjured contralateral counterpart. Otherwise no visual abnormality (no deformity, sign of displacement, etc). Palpation also reveals no palpable abnormality aside from aforementioned mild swelling. Full ROM and dexterity, albeit endured with some pain elicited during testing. Sensation and blood flow are normal. No signs of displacement, complication, or red flag signs.  -Left heel overwhelmingly consistent with plantar fasciitis. Pain elicited on palpation of left medial plantar heel at typical area, positive windlass test, etc. No erythema, swelling, discoloration, or other noteworthy signs, including no  signs of complication or red flag signs.    Assessment & Plan:   Problem List Items Addressed This Visit     Plantar fasciitis of left foot, initial encounter Disc. nature of condition at length, incl. expected course Disc. basics of mgmt, incl.  activity mod. and proper footwear; stretches, exercises, and manual therapy; and ice Also disc. pharm tx; he said he's already taking APAP but not with satisfactory relief Advised can cont. APAP following directions on the box/bottle, and we can add NSAIDs Disc. diff. NSAID options, and he'd like to try naproxen 250 mg BID; sent to pharmacy Advised of next steps if the above interventions do not bring satisfactory relief Disc. signs and symptoms warranting further attention; advised to call, return, or seek care elsewhere as appropriate/needed if these occurred or if any questions or concerns    Finger injury, right ring finger, initial encounter    Disc. DDx based on presentation; a minor, uncomplicated fracture is possible, but fracture can only be confirmed w/ radiographs. As noted above, there is no deformity, evidence of displacement, etc. Sprain/strain is also very possible, and more suspected clinically. He declines radiographs. Disc. basic mgmt and expected course, provided with a splint and Coban tape and advised on proper use of both. Also advised APAP and NSAID (see plantar fasciitis) should help w/ pain. Disc. signs and symptoms warranting further attention; advised to call, return, or seek care elsewhere as appropriate/needed if these occurred or if any questions or concerns.    BPH Reports doing well overall, just needs refill of Flomax    Relevant Medications   tamsulosin  (FLOMAX ) 0.4 MG CAPS capsule      General Education and counseling given at today's visit pertinent to issues addressed at today's visit; also discussed treatment options, risks/benefits, and goals As always, discussed signs and symptoms that warrant further attention, with instructions to return, call, or proceed to seek care elsewhere if necessary and as appropriate Patient understands, agrees to the plan of care, and has no questions; encouraged to call or follow-up if any further questions or concerns arise     Return in about 4 weeks (around 09/05/2024) for follow-up, sooner if needed.    Gladis KANDICE Bold, PA-C

## 2024-08-09 ENCOUNTER — Other Ambulatory Visit: Payer: Self-pay

## 2024-09-03 ENCOUNTER — Other Ambulatory Visit: Payer: Self-pay

## 2024-09-12 ENCOUNTER — Ambulatory Visit: Payer: Self-pay | Admitting: Gerontology

## 2024-09-19 ENCOUNTER — Ambulatory Visit: Payer: Self-pay | Admitting: Gerontology

## 2024-09-19 VITALS — BP 120/75 | HR 80 | Ht 68.0 in | Wt 170.0 lb

## 2024-09-19 NOTE — Progress Notes (Deleted)
   Established Patient Office Visit  Subjective   [name], male    DOB: 12-Sep-1971  Age: 53 y.o. MRN: [id number]  Chief Complaint  Patient presents with   Plantar Fasciitis    Pain in bottom of left foot   Hand Pain    Pt suffered an injury to the finger about 2 months and ices the injury daily    HPI  Bruce [name] is a 53 y/o male with a PMH of afib (not on anticoag due to chas vas score of 0), hypertension, BPH and prediabetes who presents for follow up visit and lab review   ROS    Objective:     BP 120/75 (BP Location: Left Arm, Patient Position: Sitting, Cuff Size: Normal)   Pulse 80   Ht 5' 8 (1.727 m)   Wt 170 lb (77.1 kg)   SpO2 94%   BMI 25.85 kg/m  {Vitals History (Optional):23777}  Physical Exam   No results found for any visits on 09/19/24.  {Labs (Optional):23779}  The 10-year ASCVD risk score (Arnett DK, et al., 2019) is: 15.2%    Assessment & Plan:   Problem List Items Addressed This Visit   None   No follow-ups on file.    Bruce Alkire E Khyli Swaim, NP

## 2024-09-24 ENCOUNTER — Other Ambulatory Visit: Payer: Self-pay

## 2024-09-24 ENCOUNTER — Other Ambulatory Visit: Payer: Self-pay | Admitting: Gerontology

## 2024-09-24 DIAGNOSIS — E119 Type 2 diabetes mellitus without complications: Secondary | ICD-10-CM

## 2024-09-24 MED FILL — Metformin HCl Tab ER 24HR 500 MG: ORAL | 10 days supply | Qty: 10 | Fill #0 | Status: AC

## 2024-09-24 MED FILL — Naproxen Tab 500 MG: ORAL | 20 days supply | Qty: 20 | Fill #0 | Status: AC

## 2024-09-24 NOTE — Progress Notes (Signed)
 Patient left without been seen

## 2024-09-25 ENCOUNTER — Other Ambulatory Visit: Payer: Self-pay

## 2024-10-24 ENCOUNTER — Ambulatory Visit: Payer: Self-pay

## 2024-12-12 ENCOUNTER — Other Ambulatory Visit: Payer: Self-pay

## 2024-12-12 ENCOUNTER — Other Ambulatory Visit: Payer: Self-pay | Admitting: Gerontology

## 2024-12-12 DIAGNOSIS — I480 Paroxysmal atrial fibrillation: Secondary | ICD-10-CM

## 2024-12-12 DIAGNOSIS — E119 Type 2 diabetes mellitus without complications: Secondary | ICD-10-CM

## 2024-12-13 ENCOUNTER — Other Ambulatory Visit: Payer: Self-pay

## 2025-01-02 ENCOUNTER — Ambulatory Visit: Payer: Self-pay
